# Patient Record
Sex: Female | Born: 1965
Health system: Southern US, Community
[De-identification: ages and names within clinical notes are randomized; demographics above are authoritative.]

## PROBLEM LIST (undated history)

## (undated) DIAGNOSIS — K219 Gastro-esophageal reflux disease without esophagitis: Secondary | ICD-10-CM

## (undated) DIAGNOSIS — J45909 Unspecified asthma, uncomplicated: Secondary | ICD-10-CM

## (undated) DIAGNOSIS — K589 Irritable bowel syndrome without diarrhea: Secondary | ICD-10-CM

## (undated) DIAGNOSIS — E119 Type 2 diabetes mellitus without complications: Secondary | ICD-10-CM

## (undated) DIAGNOSIS — L732 Hidradenitis suppurativa: Secondary | ICD-10-CM

## (undated) DIAGNOSIS — G8929 Other chronic pain: Secondary | ICD-10-CM

## (undated) DIAGNOSIS — I10 Essential (primary) hypertension: Secondary | ICD-10-CM

## (undated) DIAGNOSIS — F32A Depression, unspecified: Secondary | ICD-10-CM

## (undated) DIAGNOSIS — T7840XA Allergy, unspecified, initial encounter: Secondary | ICD-10-CM

## (undated) DIAGNOSIS — M1990 Unspecified osteoarthritis, unspecified site: Secondary | ICD-10-CM

## (undated) DIAGNOSIS — R053 Chronic cough: Secondary | ICD-10-CM

## (undated) DIAGNOSIS — F419 Anxiety disorder, unspecified: Secondary | ICD-10-CM

## (undated) DIAGNOSIS — E7800 Pure hypercholesterolemia, unspecified: Secondary | ICD-10-CM

## (undated) DIAGNOSIS — G43909 Migraine, unspecified, not intractable, without status migrainosus: Secondary | ICD-10-CM

## (undated) DIAGNOSIS — M4306 Spondylolysis, lumbar region: Secondary | ICD-10-CM

## (undated) DIAGNOSIS — L739 Follicular disorder, unspecified: Secondary | ICD-10-CM

## (undated) DIAGNOSIS — G4730 Sleep apnea, unspecified: Secondary | ICD-10-CM

## (undated) DIAGNOSIS — M545 Low back pain, unspecified: Secondary | ICD-10-CM

## (undated) DIAGNOSIS — R911 Solitary pulmonary nodule: Secondary | ICD-10-CM

## (undated) DIAGNOSIS — G894 Chronic pain syndrome: Secondary | ICD-10-CM

## (undated) DIAGNOSIS — R05 Cough: Secondary | ICD-10-CM

## (undated) HISTORY — DX: Other chronic pain: G89.29

## (undated) HISTORY — DX: Irritable bowel syndrome, unspecified: K58.9

## (undated) HISTORY — DX: Sleep apnea, unspecified: G47.30

## (undated) HISTORY — DX: Essential (primary) hypertension: I10

## (undated) HISTORY — DX: Migraine, unspecified, not intractable, without status migrainosus: G43.909

## (undated) HISTORY — DX: Hidradenitis suppurativa: L73.2

## (undated) HISTORY — DX: Low back pain, unspecified: M54.50

## (undated) HISTORY — PX: ENDOMETRIAL ABLATION: SHX621

## (undated) HISTORY — PX: TUBAL LIGATION: SHX77

## (undated) HISTORY — DX: Spondylolysis, lumbar region: M43.06

## (undated) HISTORY — DX: Solitary pulmonary nodule: R91.1

## (undated) HISTORY — DX: Pure hypercholesterolemia, unspecified: E78.00

## (undated) HISTORY — DX: Gastro-esophageal reflux disease without esophagitis: K21.9

## (undated) HISTORY — DX: Allergy, unspecified, initial encounter: T78.40XA

## (undated) HISTORY — DX: Follicular disorder, unspecified: L73.9

## (undated) HISTORY — DX: Type 2 diabetes mellitus without complications: E11.9

## (undated) HISTORY — PX: ABDOMINAL HYSTERECTOMY: SHX81

## (undated) HISTORY — DX: Depression, unspecified: F32.A

## (undated) HISTORY — DX: Chronic pain syndrome: G89.4

---

## 2000-05-21 ENCOUNTER — Ambulatory Visit (HOSPITAL_COMMUNITY): Admission: RE | Admit: 2000-05-21 | Discharge: 2000-05-21 | Payer: Self-pay | Admitting: General Surgery

## 2000-06-25 ENCOUNTER — Emergency Department (HOSPITAL_COMMUNITY): Admission: EM | Admit: 2000-06-25 | Discharge: 2000-06-25 | Payer: Self-pay | Admitting: Emergency Medicine

## 2000-06-25 ENCOUNTER — Encounter: Payer: Self-pay | Admitting: Emergency Medicine

## 2000-07-05 ENCOUNTER — Ambulatory Visit (HOSPITAL_COMMUNITY): Admission: RE | Admit: 2000-07-05 | Discharge: 2000-07-05 | Payer: Self-pay

## 2000-07-05 ENCOUNTER — Encounter: Payer: Self-pay | Admitting: Family Medicine

## 2000-08-06 ENCOUNTER — Encounter (HOSPITAL_COMMUNITY): Admission: RE | Admit: 2000-08-06 | Discharge: 2000-09-05 | Payer: Self-pay | Admitting: Family Medicine

## 2000-09-08 ENCOUNTER — Encounter (HOSPITAL_COMMUNITY): Admission: RE | Admit: 2000-09-08 | Discharge: 2000-10-08 | Payer: Self-pay | Admitting: Family Medicine

## 2001-04-18 ENCOUNTER — Encounter: Payer: Self-pay | Admitting: Family Medicine

## 2001-04-18 ENCOUNTER — Ambulatory Visit (HOSPITAL_COMMUNITY): Admission: RE | Admit: 2001-04-18 | Discharge: 2001-04-18 | Payer: Self-pay | Admitting: Family Medicine

## 2001-10-17 ENCOUNTER — Emergency Department (HOSPITAL_COMMUNITY): Admission: EM | Admit: 2001-10-17 | Discharge: 2001-10-17 | Payer: Self-pay | Admitting: *Deleted

## 2001-10-21 ENCOUNTER — Encounter: Payer: Self-pay | Admitting: Family Medicine

## 2001-10-21 ENCOUNTER — Ambulatory Visit (HOSPITAL_COMMUNITY): Admission: RE | Admit: 2001-10-21 | Discharge: 2001-10-21 | Payer: Self-pay | Admitting: Family Medicine

## 2002-06-08 ENCOUNTER — Ambulatory Visit (HOSPITAL_COMMUNITY): Admission: RE | Admit: 2002-06-08 | Discharge: 2002-06-08 | Payer: Self-pay | Admitting: Internal Medicine

## 2002-06-08 ENCOUNTER — Encounter (INDEPENDENT_AMBULATORY_CARE_PROVIDER_SITE_OTHER): Payer: Self-pay | Admitting: Internal Medicine

## 2002-08-11 ENCOUNTER — Ambulatory Visit (HOSPITAL_COMMUNITY): Admission: RE | Admit: 2002-08-11 | Discharge: 2002-08-11 | Payer: Self-pay | Admitting: Internal Medicine

## 2004-02-22 ENCOUNTER — Emergency Department (HOSPITAL_COMMUNITY): Admission: EM | Admit: 2004-02-22 | Discharge: 2004-02-23 | Payer: Self-pay | Admitting: Emergency Medicine

## 2004-02-28 ENCOUNTER — Emergency Department (HOSPITAL_COMMUNITY): Admission: EM | Admit: 2004-02-28 | Discharge: 2004-02-29 | Payer: Self-pay | Admitting: *Deleted

## 2004-04-22 ENCOUNTER — Ambulatory Visit (HOSPITAL_COMMUNITY): Admission: RE | Admit: 2004-04-22 | Discharge: 2004-04-22 | Payer: Self-pay | Admitting: Family Medicine

## 2005-01-14 ENCOUNTER — Ambulatory Visit: Payer: Self-pay | Admitting: Internal Medicine

## 2005-05-21 ENCOUNTER — Ambulatory Visit (HOSPITAL_COMMUNITY): Admission: RE | Admit: 2005-05-21 | Discharge: 2005-05-21 | Payer: Self-pay | Admitting: Family Medicine

## 2005-05-28 ENCOUNTER — Ambulatory Visit (HOSPITAL_COMMUNITY): Admission: RE | Admit: 2005-05-28 | Discharge: 2005-05-28 | Payer: Self-pay | Admitting: Family Medicine

## 2005-06-22 ENCOUNTER — Ambulatory Visit: Payer: Self-pay | Admitting: Internal Medicine

## 2005-06-23 ENCOUNTER — Ambulatory Visit (HOSPITAL_COMMUNITY): Admission: RE | Admit: 2005-06-23 | Discharge: 2005-06-23 | Payer: Self-pay | Admitting: Internal Medicine

## 2005-06-27 ENCOUNTER — Emergency Department (HOSPITAL_COMMUNITY): Admission: EM | Admit: 2005-06-27 | Discharge: 2005-06-28 | Payer: Self-pay | Admitting: Emergency Medicine

## 2005-06-29 ENCOUNTER — Ambulatory Visit (HOSPITAL_COMMUNITY): Admission: RE | Admit: 2005-06-29 | Discharge: 2005-06-29 | Payer: Self-pay | Admitting: Family Medicine

## 2005-08-03 ENCOUNTER — Ambulatory Visit: Payer: Self-pay | Admitting: Internal Medicine

## 2005-08-11 ENCOUNTER — Encounter: Admission: RE | Admit: 2005-08-11 | Discharge: 2005-11-09 | Payer: Self-pay | Admitting: Specialist

## 2006-02-10 ENCOUNTER — Inpatient Hospital Stay (HOSPITAL_COMMUNITY): Admission: EM | Admit: 2006-02-10 | Discharge: 2006-02-11 | Payer: Self-pay | Admitting: Emergency Medicine

## 2006-05-24 ENCOUNTER — Ambulatory Visit (HOSPITAL_COMMUNITY): Admission: RE | Admit: 2006-05-24 | Discharge: 2006-05-24 | Payer: Self-pay | Admitting: Family Medicine

## 2007-03-21 ENCOUNTER — Emergency Department (HOSPITAL_COMMUNITY): Admission: EM | Admit: 2007-03-21 | Discharge: 2007-03-21 | Payer: Self-pay | Admitting: Emergency Medicine

## 2007-04-12 ENCOUNTER — Ambulatory Visit: Payer: Self-pay

## 2007-05-05 ENCOUNTER — Encounter (HOSPITAL_COMMUNITY): Admission: RE | Admit: 2007-05-05 | Discharge: 2007-06-04 | Payer: Self-pay | Admitting: Orthopedic Surgery

## 2007-05-26 ENCOUNTER — Encounter (HOSPITAL_COMMUNITY): Admission: RE | Admit: 2007-05-26 | Discharge: 2007-06-25 | Payer: Self-pay | Admitting: Family Medicine

## 2007-06-06 ENCOUNTER — Encounter (HOSPITAL_COMMUNITY): Admission: RE | Admit: 2007-06-06 | Discharge: 2007-07-06 | Payer: Self-pay | Admitting: Orthopedic Surgery

## 2007-07-25 ENCOUNTER — Encounter: Payer: Self-pay | Admitting: Obstetrics and Gynecology

## 2007-07-25 ENCOUNTER — Ambulatory Visit (HOSPITAL_COMMUNITY): Admission: RE | Admit: 2007-07-25 | Discharge: 2007-07-25 | Payer: Self-pay | Admitting: Obstetrics and Gynecology

## 2008-02-16 ENCOUNTER — Ambulatory Visit (HOSPITAL_COMMUNITY): Admission: RE | Admit: 2008-02-16 | Discharge: 2008-02-16 | Payer: Self-pay | Admitting: Orthopedic Surgery

## 2008-05-31 ENCOUNTER — Ambulatory Visit (HOSPITAL_COMMUNITY): Admission: RE | Admit: 2008-05-31 | Discharge: 2008-05-31 | Payer: Self-pay | Admitting: Family Medicine

## 2008-06-04 DIAGNOSIS — M25561 Pain in right knee: Secondary | ICD-10-CM

## 2008-06-04 DIAGNOSIS — M25551 Pain in right hip: Secondary | ICD-10-CM

## 2008-06-04 DIAGNOSIS — R109 Unspecified abdominal pain: Secondary | ICD-10-CM | POA: Insufficient documentation

## 2008-06-04 DIAGNOSIS — M25562 Pain in left knee: Secondary | ICD-10-CM

## 2008-06-04 DIAGNOSIS — I10 Essential (primary) hypertension: Secondary | ICD-10-CM | POA: Insufficient documentation

## 2008-06-04 DIAGNOSIS — K5909 Other constipation: Secondary | ICD-10-CM | POA: Insufficient documentation

## 2008-06-04 DIAGNOSIS — G8929 Other chronic pain: Secondary | ICD-10-CM | POA: Insufficient documentation

## 2008-06-04 DIAGNOSIS — G43909 Migraine, unspecified, not intractable, without status migrainosus: Secondary | ICD-10-CM | POA: Insufficient documentation

## 2008-06-04 DIAGNOSIS — M25552 Pain in left hip: Secondary | ICD-10-CM

## 2008-06-05 ENCOUNTER — Ambulatory Visit: Payer: Self-pay | Admitting: Internal Medicine

## 2008-06-05 LAB — CONVERTED CEMR LAB: TSH: 1.439 microintl units/mL (ref 0.350–4.500)

## 2008-06-08 ENCOUNTER — Ambulatory Visit (HOSPITAL_COMMUNITY): Admission: RE | Admit: 2008-06-08 | Discharge: 2008-06-08 | Payer: Self-pay | Admitting: Internal Medicine

## 2008-07-17 ENCOUNTER — Ambulatory Visit: Payer: Self-pay | Admitting: Internal Medicine

## 2008-08-17 ENCOUNTER — Ambulatory Visit (HOSPITAL_COMMUNITY): Admission: RE | Admit: 2008-08-17 | Discharge: 2008-08-17 | Payer: Self-pay | Admitting: Family Medicine

## 2008-10-04 ENCOUNTER — Ambulatory Visit: Payer: Self-pay | Admitting: Internal Medicine

## 2008-10-05 ENCOUNTER — Ambulatory Visit: Payer: Self-pay | Admitting: Internal Medicine

## 2008-10-23 ENCOUNTER — Encounter: Payer: Self-pay | Admitting: Obstetrics and Gynecology

## 2008-10-23 ENCOUNTER — Inpatient Hospital Stay (HOSPITAL_COMMUNITY): Admission: RE | Admit: 2008-10-23 | Discharge: 2008-10-29 | Payer: Self-pay | Admitting: Obstetrics and Gynecology

## 2008-10-23 ENCOUNTER — Ambulatory Visit: Payer: Self-pay | Admitting: Cardiology

## 2008-10-29 ENCOUNTER — Encounter: Payer: Self-pay | Admitting: Obstetrics and Gynecology

## 2008-11-30 ENCOUNTER — Encounter (INDEPENDENT_AMBULATORY_CARE_PROVIDER_SITE_OTHER): Payer: Self-pay | Admitting: *Deleted

## 2008-12-19 ENCOUNTER — Encounter: Payer: Self-pay | Admitting: Urgent Care

## 2008-12-20 ENCOUNTER — Ambulatory Visit (HOSPITAL_COMMUNITY): Admission: RE | Admit: 2008-12-20 | Discharge: 2008-12-20 | Payer: Self-pay | Admitting: Internal Medicine

## 2008-12-20 ENCOUNTER — Encounter: Payer: Self-pay | Admitting: Internal Medicine

## 2009-04-22 ENCOUNTER — Ambulatory Visit: Payer: Self-pay | Admitting: Internal Medicine

## 2009-05-31 ENCOUNTER — Encounter: Admission: RE | Admit: 2009-05-31 | Discharge: 2009-05-31 | Payer: Self-pay | Admitting: Neurology

## 2009-07-10 HISTORY — PX: PILONIDAL CYST EXCISION: SHX744

## 2009-07-18 ENCOUNTER — Ambulatory Visit (HOSPITAL_COMMUNITY): Admission: RE | Admit: 2009-07-18 | Discharge: 2009-07-18 | Payer: Self-pay | Admitting: Family Medicine

## 2009-08-19 ENCOUNTER — Ambulatory Visit (HOSPITAL_COMMUNITY)
Admission: RE | Admit: 2009-08-19 | Discharge: 2009-08-19 | Payer: Self-pay | Source: Home / Self Care | Admitting: General Surgery

## 2009-11-29 ENCOUNTER — Encounter (INDEPENDENT_AMBULATORY_CARE_PROVIDER_SITE_OTHER): Payer: Self-pay | Admitting: *Deleted

## 2009-12-25 ENCOUNTER — Ambulatory Visit (HOSPITAL_COMMUNITY): Admission: RE | Admit: 2009-12-25 | Discharge: 2009-12-25 | Payer: Self-pay | Admitting: Family Medicine

## 2010-02-09 HISTORY — PX: KNEE SURGERY: SHX244

## 2010-03-11 NOTE — Assessment & Plan Note (Signed)
Summary: fu ov 6 mo,constipation/ams   Visit Type:  Follow-up Visit Primary Care Provider:  Lilyan Punt  Chief Complaint:  F/U constipation.  History of Present Illness: Patient is here for six month f/u of chronic constipation. Over the past couple of months, she has had more problems with constipation. She increased her Amitiza to three times a day on her own about three weeks ago. She went from having days without a BM to now having 3-4 soft to loose stools daily. Denies melena, brbpr. She has intermittent abdominal pain in lower abdomen and to left lower quadrant which is crampy. She c/o gas. Denies melena, brbpr. She has had a hysterectomy for fibroid tumors. She takes Mobic for knee pain on a regular basis, but does not take Treximet regularly. She tries not to mix the two NSAIDS. She denies heartburn, vomiting, dysphagia, unintentional weight loss. She also wonders about a recurrent boil on her buttocks. She has taken two rounds of antibiotics but it continues to come back. It drains at times.       Current Medications (verified): 1)  Metoprolol Tartrate 100 Mg Tabs (Metoprolol Tartrate) .... Two Times A Day 2)  Metformin Hcl 500 Mg Tabs (Metformin Hcl) .... Take 1 Tablet By Mouth Two Times A Day 3)  Meloxicam 15 Mg Tabs (Meloxicam) .... Take 1 Tablet By Mouth Once A Day 4)  Triamterene-Hctz 37.5-25 Mg Tabs (Triamterene-Hctz) .... Take 1 Tablet By Mouth Once A Day 5)  Treximet 85-500 Mg Tabs (Sumatriptan-Naproxen Sodium) .... As Needed 6)  Hydrocodone-Acetaminophen 5-500 Mg Tabs (Hydrocodone-Acetaminophen) .... As Needed 7)  Promethazine Hcl 25 Mg Tabs (Promethazine Hcl) .... As Needed With Migraine 8)  Tizanidine Hcl 2 Mg Tabs (Tizanidine Hcl) .... One Tablet in The Morning and Two Tablets At Night 9)  Flexeril 10 Mg Tabs (Cyclobenzaprine Hcl) .... As Needed 10)  Amitiza 24 Mcg Caps (Lubiprostone) .... Taking 3 Tablets Daily  Allergies (verified): No Known Drug  Allergies  Past History:  Past Surgical History: CYST REMOVED FROM RIGHT BREAST AS WELL AS VAGINAL AREA TUBAL LIGATION LATE 1980s Hysterectomy for fibroid tumors, 9/10  Review of Systems      See HPI  Vital Signs:  Patient profile:   45 year old female Height:      66 inches Weight:      226 pounds BMI:     36.61 Temp:     97.9 degrees F oral Pulse rate:   64 / minute BP sitting:   134 / 80  (left arm) Cuff size:   regular  Vitals Entered By: Cloria Spring LPN (April 22, 2009 11:11 AM)  Physical Exam  General:  Well developed, well nourished, no acute distress. Head:  Normocephalic and atraumatic. Eyes:  Sclera  nonicteric. Mouth:  OP moist. Lungs:  Clear throughout to auscultation. Heart:  Regular rate and rhythm; no murmurs, rubs,  or bruits. Abdomen:  normal bowel sounds and obese.  Mild lower abd tenderness to deep palpation. No rebound or guarding. No abd bruit or hernia. Suprapubic incision noted with keloid. No drainage. Rectal:  Exam of external buttocks, reveals 3-4 mm in size raised bump without active drainage. Extremities:  No clubbing, cyanosis, edema or deformities noted. Neurologic:  Alert and  oriented x4;  grossly normal neurologically. Skin:  Intact without significant lesions or rashes. Psych:  Alert and cooperative. Normal mood and affect.  Impression & Recommendations:  Problem # 1:  CONSTIPATION, CHRONIC (ICD-564.09)  Need to adjust regimen. Advised against  taking three Amitiza daily. Will go back to Amitiza by mouth two times a day. Will add Miralax 17 grams by mouth daily as needed. Will add Sustenex one by mouth daily for four weeks. #30 samples. If no improvement, she will call. At that time would consider TCS if no improvement. Last colon imaging was FS with ACBE in 2004.  Orders: Est. Patient Level II (22025)  Problem # 2:  ABDOMINAL PAIN (ICD-789.00)  Likely secondary to above.   Orders: Est. Patient Level II (42706)  Problem  # 3:  ? of PILONIDAL CYST (ICD-685.1)  Recurrent boil at top of gluteal cleft. Small and currently no evidence of abcess. ?pilonidal cyst. I have asked that she call her PCP and discuss with them. She has already had two rounds of antibiotics. May need I+D. She will call PCP and let me know if any further problems or desires referral to surgeon Franky Macho).   Orders: Est. Patient Level II (23762) Prescriptions: POLYETHYLENE GLYCOL 3350  POWD (POLYETHYLENE GLYCOL 3350) 17 gram by mouth daily  #527 grams x 11   Entered and Authorized by:   Leanna Battles. Dixon Boos   Signed by:   Leanna Battles Lewis PA-C on 04/22/2009   Method used:   Print then Give to Patient   RxID:   [id number]

## 2010-03-11 NOTE — Letter (Signed)
Summary: Recall Radiology  [facility]  [address]   [place], [place] [address]   Phone: [phone]  Fax: [phone]    November 29, 2009  [name] [address] [place], [place]  [address] Feb 02, 1966   Dear Ms. [name],   Our office needs to get you scheduled for your CT Scan. Please give our office a call to schedule this.  You may call the office at your convenience at [phone].  Please ask for the Referral Coordinator to make arrangements for this to be scheduled.  You may have to leave a message on our voice mail.  We will return your call.  If for any reason you do not wish to schedule this, please advise the office.  Please do not neglect your health.   Thank you,    Ave Filter  [facility] Ph: [phone]   Fax: [phone]    Appended Document: Recall Radiology Patient called and stated that her primary Dr.(Scott Luking)has already scheduled her CT Scan for November 16th 2011 at [facility]

## 2010-04-27 LAB — CBC
HCT: 38 % (ref 36.0–46.0)
MCV: 90.8 fL (ref 78.0–100.0)
RBC: 4.18 MIL/uL (ref 3.87–5.11)
RDW: 12.9 % (ref 11.5–15.5)
WBC: 8.4 10*3/uL (ref 4.0–10.5)

## 2010-04-27 LAB — POCT I-STAT 4, (NA,K, GLUC, HGB,HCT)
Glucose, Bld: 137 mg/dL — ABNORMAL HIGH (ref 70–99)
HCT: 40 % (ref 36.0–46.0)
Hemoglobin: 13.6 g/dL (ref 12.0–15.0)
Potassium: 3.4 mEq/L — ABNORMAL LOW (ref 3.5–5.1)
Sodium: 135 mEq/L (ref 135–145)

## 2010-04-27 LAB — BASIC METABOLIC PANEL
BUN: 14 mg/dL (ref 6–23)
Chloride: 102 mEq/L (ref 96–112)
Potassium: 3.3 mEq/L — ABNORMAL LOW (ref 3.5–5.1)

## 2010-05-16 LAB — BASIC METABOLIC PANEL
BUN: 2 mg/dL — ABNORMAL LOW (ref 6–23)
BUN: 3 mg/dL — ABNORMAL LOW (ref 6–23)
BUN: 5 mg/dL — ABNORMAL LOW (ref 6–23)
CO2: 31 mEq/L (ref 19–32)
Calcium: 8.6 mg/dL (ref 8.4–10.5)
Calcium: 8.9 mg/dL (ref 8.4–10.5)
Chloride: 102 mEq/L (ref 96–112)
Creatinine, Ser: 0.63 mg/dL (ref 0.4–1.2)
GFR calc Af Amer: >60 mL/min (ref >60)
GFR calc non Af Amer: >60 mL/min (ref >60)
GFR calc non Af Amer: >60 mL/min (ref >60)
GFR calc non Af Amer: >60 mL/min (ref >60)
GFR calc non Af Amer: >60 mL/min (ref >60)
Glucose, Bld: 104 mg/dL — ABNORMAL HIGH (ref 70–99)
Glucose, Bld: 117 mg/dL — ABNORMAL HIGH (ref 70–99)
Glucose, Bld: 159 mg/dL — ABNORMAL HIGH (ref 70–99)
Potassium: 3.3 mEq/L — ABNORMAL LOW (ref 3.5–5.1)
Potassium: 3.6 mEq/L (ref 3.5–5.1)
Sodium: 135 mEq/L (ref 135–145)
Sodium: 137 mEq/L (ref 135–145)
Sodium: 139 mEq/L (ref 135–145)

## 2010-05-16 LAB — CBC
HCT: 18.4 % — ABNORMAL LOW (ref 36.0–46.0)
HCT: 19.4 % — ABNORMAL LOW (ref 36.0–46.0)
HCT: 30.7 % — ABNORMAL LOW (ref 36.0–46.0)
HCT: 38.2 % (ref 36.0–46.0)
Hemoglobin: 11 g/dL — ABNORMAL LOW (ref 12.0–15.0)
Hemoglobin: 6.4 g/dL — CL (ref 12.0–15.0)
Hemoglobin: 13.1 g/dL (ref 12.0–15.0)
MCHC: 34.8 g/dL (ref 30.0–36.0)
MCHC: 34.5 g/dL (ref 30.0–36.0)
MCV: 92.1 fL (ref 78.0–100.0)
MCV: 90 fL (ref 78.0–100.0)
Platelets: 239 10*3/uL (ref 150–400)
Platelets: 260 10*3/uL (ref 150–400)
Platelets: 296 10*3/uL (ref 150–400)
RBC: 2 MIL/uL — ABNORMAL LOW (ref 3.87–5.11)
RBC: 3.39 MIL/uL — ABNORMAL LOW (ref 3.87–5.11)
RBC: 4.24 MIL/uL (ref 3.87–5.11)
RDW: 13.1 % (ref 11.5–15.5)
RDW: 13.6 % (ref 11.5–15.5)
RDW: 14.2 % (ref 11.5–15.5)
WBC: 6.7 10*3/uL (ref 4.0–10.5)
WBC: 7.2 10*3/uL (ref 4.0–10.5)

## 2010-05-16 LAB — GLUCOSE, CAPILLARY
Glucose-Capillary: 100 mg/dL — ABNORMAL HIGH (ref 70–99)
Glucose-Capillary: 112 mg/dL — ABNORMAL HIGH (ref 70–99)
Glucose-Capillary: 112 mg/dL — ABNORMAL HIGH (ref 70–99)
Glucose-Capillary: 114 mg/dL — ABNORMAL HIGH (ref 70–99)
Glucose-Capillary: 122 mg/dL — ABNORMAL HIGH (ref 70–99)
Glucose-Capillary: 123 mg/dL — ABNORMAL HIGH (ref 70–99)
Glucose-Capillary: 130 mg/dL — ABNORMAL HIGH (ref 70–99)
Glucose-Capillary: 131 mg/dL — ABNORMAL HIGH (ref 70–99)
Glucose-Capillary: 132 mg/dL — ABNORMAL HIGH (ref 70–99)
Glucose-Capillary: 121 mg/dL — ABNORMAL HIGH (ref 70–99)
Glucose-Capillary: 133 mg/dL — ABNORMAL HIGH (ref 70–99)
Glucose-Capillary: 149 mg/dL — ABNORMAL HIGH (ref 70–99)
Glucose-Capillary: 172 mg/dL — ABNORMAL HIGH (ref 70–99)
Glucose-Capillary: 208 mg/dL — ABNORMAL HIGH (ref 70–99)

## 2010-05-16 LAB — DIFFERENTIAL
Basophils Absolute: 0 10*3/uL (ref 0.0–0.1)
Basophils Absolute: 0 10*3/uL (ref 0.0–0.1)
Basophils Absolute: 0 10*3/uL (ref 0.0–0.1)
Basophils Relative: 0 % (ref 0–1)
Eosinophils Absolute: 0.3 10*3/uL (ref 0.0–0.7)
Eosinophils Relative: 2 % (ref 0–5)
Eosinophils Relative: 4 % (ref 0–5)
Eosinophils Relative: 4 % (ref 0–5)
Lymphocytes Relative: 14 % (ref 12–46)
Lymphocytes Relative: 19 % (ref 12–46)
Lymphocytes Relative: 21 % (ref 12–46)
Lymphocytes Relative: 24 % (ref 12–46)
Lymphs Abs: 1.6 10*3/uL (ref 0.7–4.0)
Monocytes Absolute: 0.6 10*3/uL (ref 0.1–1.0)
Monocytes Absolute: 1.1 10*3/uL — ABNORMAL HIGH (ref 0.1–1.0)
Monocytes Relative: 10 % (ref 3–12)
Monocytes Relative: 9 % (ref 3–12)
Neutro Abs: 5.1 10*3/uL (ref 1.7–7.7)
Neutro Abs: 5.1 10*3/uL (ref 1.7–7.7)
Neutrophils Relative %: 67 % (ref 43–77)

## 2010-05-16 LAB — CROSSMATCH: Antibody Screen: NEGATIVE

## 2010-05-16 LAB — COMPREHENSIVE METABOLIC PANEL
BUN: 14 mg/dL (ref 6–23)
CO2: 27 mEq/L (ref 19–32)
Calcium: 9.4 mg/dL (ref 8.4–10.5)
Chloride: 104 mEq/L (ref 96–112)
Creatinine, Ser: 0.65 mg/dL (ref 0.4–1.2)
GFR calc Af Amer: >60 mL/min (ref >60)
GFR calc non Af Amer: >60 mL/min (ref >60)
Glucose, Bld: 111 mg/dL — ABNORMAL HIGH (ref 70–99)
Total Bilirubin: 0.7 mg/dL (ref 0.3–1.2)

## 2010-05-16 LAB — TYPE AND SCREEN
ABO/RH(D): O NEG
Antibody Screen: NEGATIVE

## 2010-06-24 NOTE — H&P (Signed)
NAME:  [name], [name]           ACCOUNT NO.:  [id number]   MEDICAL RECORD NO.:  [id number]          PATIENT TYPE:  AMB   LOCATION:  DAY                           FACILITY:  [facility]   PHYSICIAN:  Tilda Burrow, M.D. DATE OF BIRTH:  1965/10/10   DATE OF ADMISSION:  DATE OF DISCHARGE:  LH                              HISTORY & PHYSICAL   ADMITTING DIAGNOSES:  1. Menorrhagia secondary to uterine fibroids.  2. Anemia.  3. Migraine, admitted for hysteroscopy, dilation and curettage, and      endometrial ablation.   The patient is a 45 year old female gravida 1, para 1, status post tubal  ligation, last menstrual period July 15, 2007 to July 18, 2007, was on  admitted on July 25, 2007, for hysteroscopy, D&C, and endometrial  ablation after referral from Dr. Fletcher Anon office for she was seen  complaining of heavy menses and associated mild anemia.  She has had a  transabdominal and transvaginal ultrasound which shows multiple small  fibroids up to 3-cm in maximum diameter, none of which are located  submucosally.  The plan is for hysteroscopy, D&C, and endometrial  ablation.  Previous endometrial thickening of 5-mm during proliferative  phase has been documented and reviewed with Dr Alver Fisher who indicates there  are no suspicious areas.  Endometrial biopsy is therefore deferred.   PAST MEDICAL HISTORY:  Benign other than mild hypertension and right  knee pain.  Additionally, the patient has intermittent reflux symptoms  which she treats with omeprazole p.r.n.  She has significant migraine  headaches which require treatment at least weekly.   SURGICAL HISTORY:  Right knee arthroscopy and bilateral tubal ligation.  Injuries none.   ALLERGIES:  None.   MEDICATIONS:  1. Omeprazole 20 mg p.o. daily p.r.n. heartburn.  2. Cymbalta 60 mg p.o. daily for mild depression.  3. Meloxicam 15 mg daily for knee pain.  4. Triamterene and hydrochlorothiazide p.o. daily for blood pressure.  5.  Tizanidine 2 mg 1 q.a.m., 2 q.p.m.  6. Metoprolol 100 mg 2 tablets daily.  7. Topamax 50 mg 2 tablets daily.  8. Treximet 85/500 taken p.r.n. for migraines.  9. Additional p.r.n. medicines for headaches include promethazine,      Flexeril, and Vicodin 750.  She also periodically takes      methocarbamol 500 mg p.r.n. knee pain, and ibuprofen p.r.n. knee      pain.   ALLERGIES:  No known drug allergies.   PHYSICAL EXAMINATION:  GENERAL:  Height 5 feet 6 inches, weight 234.  VITAL SIGNS:  Blood pressure 138/78, pulse 70.  HEENT:  Pupils equal, round, and reactive to light.  NECK:  Supple.  Normal thyroid.  CHEST:  Clear to auscultation.  ABDOMEN:  Obese without distinct masses or tenderness.  EXTERNAL GENITALIA:  Multiparous.  Vaginal exam:  Normal secretions.  Mid cycle mucus on cervix.  Uterus anteflexed.  Upper limits of normal  size.  Adnexa without masses or tenderness.  Rectal support appears  adequate.   IMPRESSION:  1. Menorrhagia with mild anemia.  2. Migraine headache.  3. Hypertension.  4. Knee arthritis.  5. Mild  anemia.   PLAN:  Hysteroscopy, dilation and curettage, and endometrial ablation on  July 25, 2007.   ADDENDUM:  The patient has had procedure reviewed using instructional  booklets by Gaylyn Rong as well as Gynecare Thermachoice III medical  explainers.  Questions have been encouraged from patient and on July 21, 2007, has no further questions proceeding the surgery.      Tilda Burrow, M.D.  Electronically Signed     JVF/MEDQ  D:  07/21/2007  T:  07/22/2007  Job:  [id number]   cc:   Dr. Gerda Diss   [facility]

## 2010-06-24 NOTE — Op Note (Signed)
NAMEDAKIYAH, [name]           ACCOUNT NO.:  [id number]   MEDICAL RECORD NO.:  [id number]          PATIENT TYPE:  AMB   LOCATION:  DAY                           FACILITY:  [facility]   PHYSICIAN:  Tilda Burrow, M.D. DATE OF BIRTH:  [birth year]   DATE OF PROCEDURE:  07/25/2007  DATE OF DISCHARGE:                               OPERATIVE REPORT   PREOPERATIVE DIAGNOSES:  1. Menorrhagia secondary to uterine fibroids.  2. Anemia.  3. Migraines.   POSTOPERATIVE DIAGNOSES:  1. Menorrhagia secondary to uterine fibroids.  2. Anemia.  3. Migraines.  4. Endometrial polyps.   PROCEDURES:  1. Hysteroscopy.  2. Dilation and curettage.  3. Endometrial ablation.   SURGEON:  Tilda Burrow, MD   ASSISTANT:  None.   ANESTHESIA:  General with endotracheal intubation.   COMPLICATIONS:  None.   FINDINGS:  1. A 1.5-cm pedunculated endometrial polyp.  2. Uterine fundal fibroid, type 2, with greater than 50% of the      fibroid buried in the uterine fundus.  3. Shaggy endometrium, though the patient is in the follicular phase,      curetted.   DETAILS OF PROCEDURE:  The patient was taken to the operating room,  prepped, and draped for vaginal procedure after consent was obtained and  urine hCG confirmed as negative.  Time-out was conducted according to  [facility] standards and procedure initiated.  Cervix was grasped, and the  uterus sounded to 10 cm, dilated with dilators to 25 [place], allowing  introduction of a 30-degree rigid hysteroscope visualizing the uterine  cavity with a fundal endometrial polyp 1.5 cm in length with a small  polyp stalk noted as well as a fundal fibroid just to the medial side of  the right tubal ostium.  Both tubal ostia could be visualized.  The  uterine fundal fibroid was type 2 buried primarily in the uterine wall  but felt to be still a good candidate for ablation procedure.  Once  curettage was performed and hysteroscopy confirmed that we had removed  the  polyp down to the its base using hysteroscopic resection scissors,  we were able to perform the Gynecare ThermaChoice III endometrial  ablation sequence using 15 mL of D5W for an 8-minute sequence, with all  15 mL of fluid recovered at the end of the procedure.  Paracervical  block using 20 mL of 0.5% Marcaine with Epinephrine was injected  paracervically in paracervical tissues and the patient allowed to awaken  and go to the recovery room in good condition, where Toradol will be  given intravenously x1 and oral NSAIDs and Percocet prescriptions given  to the patient for discharge.  Routine outpatient medications will be  continued unchanged.      Tilda Burrow, M.D.  Electronically Signed     JVF/MEDQ  D:  07/25/2007  T:  07/26/2007  Job:  [id number]   cc:   [facility]   Donna Bernard, M.D.  Fax: [phone]

## 2010-06-27 NOTE — H&P (Signed)
[name], [name]           ACCOUNT NO.:  [id number]   MEDICAL RECORD NO.:  [id number]          PATIENT TYPE:  INP   LOCATION:  IC07                          FACILITY:  [facility]   PHYSICIAN:  Scott A. Gerda Diss, MD    DATE OF BIRTH:  1965-12-17   DATE OF ADMISSION:  02/10/2006  DATE OF DISCHARGE:  LH                              HISTORY & PHYSICAL   CHIEF COMPLAINT:  Lethargy.   HISTORY OF PRESENT ILLNESS:  This is a 45 year old black female who has  a known history of migraines. Has not seen [place] since May of 2007.  Apparently has been having a lot of migraines, and this morning woke up  and her head was hurting, and so she went ahead and took a dose of her  Neurontin as well as taking baclofen. When it came to finding how much  baclofen she took, she thinks she took somewhere between 2 or more. She  denies using other medications other than the Neurontin and baclofen.  She denies using any street drugs and states that she was just having a  bad headache. She is arousable, but she is also lethargic and unable to  follow complex commands. She does not have any focal signs. The ER  doctor went head and evaluated her and called [place] to admit her. The  patient is not suicidal.   PAST MEDICAL HISTORY:  1. Migraines.  2. History of hypertension.  3. Cluster headaches.   FAMILY HISTORY:  Colon cancer, hypertension, diabetes, heart disease,  and lipids.   SOCIAL HISTORY:  Does not work currently. Does not smoke.   MEDICATIONS:  1. Baclofen on a p.r.n. basis.  2. Neurontin 600 mg b.i.d.   The patient is uncertain of any other medicines she has. She states she  gets her medicines through [facility] in [place] and [facility].   REVIEW OF SYSTEMS:  See per above. No fevers. No cough. No respiratory  distress, vomiting or diarrhea recently.   LABORATORY DATA:  The laboratory work done by the ER doctor came back  all essentially normal except for a slightly low potassium and a  slightly low albumin.   PHYSICAL EXAMINATION:  Pupils are dilated, somewhat responsive to light.  The patient is unable to follow EOMI instruction.  NECK:  Normal. No masses. No respiratory obstruction noted.  LUNGS:  Clear.  ABDOMEN:  Soft.  EXTREMITIES:  No edema. Patient able to move arms and legs by command  but not able to do complex functions.   CT scan of the head reported negative by ER doctor. Laboratory work  overall looks normal except for slightly low potassium.   ASSESSMENT AND PLAN:  Drug ingestion, accidental:  Will admit to ICU for  supportive care. I expect the patient to significantly improve over the  next 24 to 48 hours. Will consult pharmacy to see if any charcoal is  indicated. My impression is that it is not, given that the ingestion  probably occurred somewhere around 4 or 5 a.m., and it is now currently  1:30 p.m. In addition to this, I do not feel the  patient ought to be on  these medications when she gets discharged. I think she ought to talk  with her specialist about other combinations. We will go ahead and talk  with her pharmacy at [facility] as well as [facility] to get a profile on  what she has been getting filled recently since it has been greater than  6 months since we had last seen her.      Scott A. Gerda Diss, MD  Electronically Signed     SAL/MEDQ  D:  02/10/2006  T:  02/10/2006  Job:  [id number]

## 2010-06-27 NOTE — Op Note (Signed)
   NAME:  [name], [name]                     ACCOUNT NO.:  [id number]   MEDICAL RECORD NO.:  [id number]                   PATIENT TYPE:  AMB   LOCATION:  DAY                                  FACILITY:  [facility]   PHYSICIAN:  Lionel December, M.D.                 DATE OF BIRTH:  October 13, 1965   DATE OF PROCEDURE:  08/11/2002  DATE OF DISCHARGE:                                 OPERATIVE REPORT   PROCEDURE:  Flexible sigmoidoscopy.   ENDOSCOPIST:  Lionel December, M.D.   INDICATIONS:  [name] is a 45 year old African American female with a  problem with chronic constipation.  She is getting better with therapy.  She  had a barium enema recently which was normal.  She was seen earlier in the  week with hematochezia.  Therefore, a sigmoidoscopy is recommended.  The  procedure was reviewed with the patient and informed consent was obtained.   PREMEDICATIONS:  None.   FINDINGS:  The procedure was performed in the endoscopy suite.  The patient  was placed in the left lateral recumbent position.  Rectal examination was  performed.  Same pattern as noted on digital examination but there was no  abnormality.  The Olympus videoscope was placed in the rectum and advanced  under vision into the sigmoid colon and beyond.  Preparation was excellent.  The scope was passed to 60 cm which was felt to be the splenic flexure.  As  the scope was withdrawn, the colonic mucosa was carefully examined and it  was normal. Rectal mucosa was normal.   The scope was retroflexed to examine the anorectal junction and hemorrhoids  were noted below the dentate line along with some petechiae at the anorectal  junction.  The endoscope was straightened and withdrawn.   The patient tolerated the procedure well.   FINAL DIAGNOSIS:  Normal examination to 60 cm other than external  hemorrhoids felt to be the cause of her recent rectal bleeding.    RECOMMENDATIONS:  1. She will continue barbiturates, high-fiber diet,  Lactulose, and Zelnorm     as before.  2. Anusol HC as directed at bedtime x2 weeks.  3. Keep stool diary and  return for OV two months from now.                                               Lionel December, M.D.    NR/MEDQ  D:  08/11/2002  T:  08/11/2002  Job:  [id number]   cc:   Lorin Picket A. Gerda Diss, M.D.  [address]  [place]  [place] [address]  Fax: [phone]

## 2010-06-27 NOTE — H&P (Signed)
NAME:  [name], [name]                     ACCOUNT NO.:  [id number]   MEDICAL RECORD NO.:  [id number]                   PATIENT TYPE:  OUT   LOCATION:  RAD                                  FACILITY:  [facility]   PHYSICIAN:  Lionel December, M.D.                 DATE OF BIRTH:  10-Jul-1965   DATE OF ADMISSION:  06/08/2002  DATE OF DISCHARGE:  06/08/2002                                HISTORY & PHYSICAL   PRESENTING COMPLAINT:  Constipation, hematochezia.   HISTORY OF PRESENT ILLNESS:  [name] is a 45 year old African-American  female who was here for a scheduled visit for follow up for constipation.  She was last seen on April 20, 2002.  Her constipation began about seven or  eight months ago.  She had a normal TSH and a serum calcium.  She was begun  on FiberChoice.  She was given GoLYTELY x1, and begun on lactulose.  She  returned for a follow up visit on June 05, 2002, and she was not feeling a  lot better.  She had been taking OTC laxatives.  She was begun on Zelnorm.  She says she is feeling a little bit better.  She is having an average of  one bowel movement every other day, and what she passes are hard stool or  balls.  She has had a few episodes of hematochezia, but this morning she  passed what she describes to be a large amount of fresh blood.  She did not  have any melena, nausea, or vomiting.  She did have pain across her upper  abdomen, which was alleviated once she had a bowel movement.  She remains  with a good appetite.   MEDICATIONS:  1. She is using hydrocodone/AP for migraines, but she does not take it every     day.  2. Phenergan 25 mg  3. __________  p.r.n.  4. Aleve p.r.n.  5. Zyrtec D p.r.n.  6. Allegra p.r.n.  7. Lactulose two teaspoonsful daily.  8. FiberChoice two tablets daily.  9. Zelnorm 6 mg b.i.d.   PAST MEDICAL HISTORY:  Negative, other than migraines.   ALLERGIES:  None known.   FAMILY HISTORY:  Negative for colorectal carcinoma.  Father is  diabetic.   SOCIAL HISTORY:  She is single.  She works at the med center.  She has never  smoked cigarettes, and does not drink alcohol.   PHYSICAL EXAMINATION:  GENERAL:  A pleasant, mildly obese African-American  female who is in no acute distress.  She weighs 216 pounds.  She is 5'5  tall.  VITAL SIGNS:  Pulse 82 per minute, blood pressure 140/82.  She is afebrile.  HEENT:  Conjunctivae are pink.  Sclerae are anicteric.  NECK:  Without masses or thyromegaly.  CARDIAC:  Regular rate.  Normal S1 and S2.  No murmur or gallops noted.  LUNGS:  Clear to auscultation.  ABDOMEN:  Obese.  Bowel  sounds are normal.  Palpation reveals a soft abdomen  without tenderness,  organomegaly or masses.  RECTAL:  Deferred.  EXTREMITIES:  No peripheral edema or clubbing noted.   She also had a barium enema on June 08, 2002, which was a normal contrast  study.   ASSESSMENT:  [name] has chronic constipation felt to be due to  dysmotility.  Her TSH and serum calcium are normal.  She has not responded  well to therapy; however, she is only on a small dose of lactulose.  She had  hematochezia today with passage of a fairly large amount of fresh blood.  I  suspect this is related to hemorrhoidal bleeding, and she had a barium enema  recently.  Will do a flexible sigmoidoscopy to make sure she does not have  any poly or other lesions in the rectal sigmoid area.   PLAN:  1. Flexible sigmoidoscopy to be performed at [facility] in the near     future.  2. She will increase the lactulose to 30 ml daily, and continue Zelnorm at 6     mg b.i.d.  3. I have asked her to keep a diary as to stool frequency.  If she does not     have a bowel movement by day #3, she can use a suppository and/or Fleet's     enema.                                               Lionel December, M.D.    NR/MEDQ  D:  08/07/2002  T:  08/08/2002  Job:  [id number]   cc:   Lorin Picket A. Gerda Diss, M.D.  [address]   [place]  [place] [address]  Fax: [phone]

## 2010-06-27 NOTE — Discharge Summary (Signed)
NAMEABRYANNA, [name]           ACCOUNT NO.:  [id number]   MEDICAL RECORD NO.:  [id number]          PATIENT TYPE:  INP   LOCATION:  IC07                          FACILITY:  [facility]   PHYSICIAN:  Scott A. Gerda Diss, MD    DATE OF BIRTH:  [birth year]   DATE OF ADMISSION:  02/10/2006  DATE OF DISCHARGE:  LH                               DISCHARGE SUMMARY   DISCHARGE DIAGNOSES:  1. Hypersomnolence with mental status changes secondary to medication      accidental over ingestion.  2. Severe migraines.  3. HTN.   HOSPITAL COURSE:  This patient was admitted with having significant  drowsiness, lethargy, mental confusion, and the inability to really  follow commands well, except for awaking briefly to talk.  It was felt  that the patient needed to be admitted into the ICU in order to be  monitored closely.  She was admitted in and watched closely and over the  course of the next 24 hours, she became more perky, awake, alert,  responsive, and interactive.  She was able to feed herself, get up, sit,  eat, and to breathe normally with a normal blood pressure.  She did have  some headache during the night, which goes on with her migraine  syndrome.  She was treated with a Vicodin without any significant side  effect.  She was felt stable to go home on 01/11/06.   DISCHARGE MEDICATIONS:  Discharged on:  1. Neurontin 600 mg 1 twice a day.  2. Baclofen to use rarely 1 every 8 hours as needed for severe muscle      spasms in the neck.  3. Diazide 37.5/25 one daily.  4. Metoprolol 100 mg 1 b.i.d.   DISCHARGE INSTRUCTIONS:  She was instructed not to drive today, start on  Celexa 20 mg 1/2 daily for 7 days and then 1 daily.  She is to follow up  in the office in 1 week.  We will work on setting her up with a headache  specialist in [place] or here in [place].      Scott A. Gerda Diss, MD  Electronically Signed     SAL/MEDQ  D:  02/11/2006  T:  02/11/2006  Job:  [id number]

## 2010-07-14 ENCOUNTER — Other Ambulatory Visit: Payer: Self-pay | Admitting: Family Medicine

## 2010-07-14 DIAGNOSIS — Z139 Encounter for screening, unspecified: Secondary | ICD-10-CM

## 2010-07-20 ENCOUNTER — Emergency Department (HOSPITAL_COMMUNITY)
Admission: EM | Admit: 2010-07-20 | Discharge: 2010-07-20 | Disposition: A | Discharge status: Home / Self Care | Payer: Medicare Other | Attending: Emergency Medicine | Admitting: Emergency Medicine

## 2010-07-20 DIAGNOSIS — M545 Low back pain, unspecified: Secondary | ICD-10-CM | POA: Insufficient documentation

## 2010-07-20 DIAGNOSIS — E119 Type 2 diabetes mellitus without complications: Secondary | ICD-10-CM | POA: Insufficient documentation

## 2010-07-20 DIAGNOSIS — M542 Cervicalgia: Secondary | ICD-10-CM | POA: Insufficient documentation

## 2010-07-20 DIAGNOSIS — I10 Essential (primary) hypertension: Secondary | ICD-10-CM | POA: Insufficient documentation

## 2010-07-22 ENCOUNTER — Ambulatory Visit (HOSPITAL_COMMUNITY): Payer: Medicare Other

## 2010-07-25 ENCOUNTER — Emergency Department (HOSPITAL_COMMUNITY): Payer: Medicare Other

## 2010-07-25 ENCOUNTER — Emergency Department (HOSPITAL_COMMUNITY)
Admission: EM | Admit: 2010-07-25 | Discharge: 2010-07-25 | Disposition: A | Discharge status: Home / Self Care | Payer: Medicare Other | Attending: Emergency Medicine | Admitting: Emergency Medicine

## 2010-07-25 ENCOUNTER — Ambulatory Visit (HOSPITAL_COMMUNITY)
Admission: RE | Admit: 2010-07-25 | Discharge: 2010-07-25 | Disposition: A | Discharge status: Home / Self Care | Payer: Medicare Other | Source: Ambulatory Visit | Attending: Family Medicine | Admitting: Family Medicine

## 2010-07-25 DIAGNOSIS — R42 Dizziness and giddiness: Secondary | ICD-10-CM | POA: Insufficient documentation

## 2010-07-25 DIAGNOSIS — Z1231 Encounter for screening mammogram for malignant neoplasm of breast: Secondary | ICD-10-CM | POA: Insufficient documentation

## 2010-07-25 DIAGNOSIS — Z79899 Other long term (current) drug therapy: Secondary | ICD-10-CM | POA: Insufficient documentation

## 2010-07-25 DIAGNOSIS — R071 Chest pain on breathing: Secondary | ICD-10-CM | POA: Insufficient documentation

## 2010-07-25 DIAGNOSIS — R0602 Shortness of breath: Secondary | ICD-10-CM | POA: Insufficient documentation

## 2010-07-25 DIAGNOSIS — Z139 Encounter for screening, unspecified: Secondary | ICD-10-CM

## 2010-07-25 DIAGNOSIS — F3289 Other specified depressive episodes: Secondary | ICD-10-CM | POA: Insufficient documentation

## 2010-07-25 DIAGNOSIS — E119 Type 2 diabetes mellitus without complications: Secondary | ICD-10-CM | POA: Insufficient documentation

## 2010-07-25 DIAGNOSIS — G43909 Migraine, unspecified, not intractable, without status migrainosus: Secondary | ICD-10-CM | POA: Insufficient documentation

## 2010-07-25 DIAGNOSIS — R110 Nausea: Secondary | ICD-10-CM | POA: Insufficient documentation

## 2010-07-25 DIAGNOSIS — F329 Major depressive disorder, single episode, unspecified: Secondary | ICD-10-CM | POA: Insufficient documentation

## 2010-07-25 DIAGNOSIS — I10 Essential (primary) hypertension: Secondary | ICD-10-CM | POA: Insufficient documentation

## 2010-07-25 LAB — CK TOTAL AND CKMB (NOT AT ARMC): Relative Index: INVALID (ref 0.0–2.5)

## 2010-07-25 LAB — TROPONIN I: Troponin I: <0.3 ng/mL (ref <0.30)

## 2010-07-25 LAB — BASIC METABOLIC PANEL
CO2: 28 mEq/L (ref 19–32)
Chloride: 94 mEq/L — ABNORMAL LOW (ref 96–112)
Creatinine, Ser: 0.79 mg/dL (ref 0.50–1.10)
GFR calc Af Amer: >60 mL/min (ref >60)
Potassium: 3.6 mEq/L (ref 3.5–5.1)

## 2010-07-25 LAB — DIFFERENTIAL
Eosinophils Relative: 3 % (ref 0–5)
Lymphocytes Relative: 35 % (ref 12–46)
Lymphs Abs: 2.9 10*3/uL (ref 0.7–4.0)
Monocytes Absolute: 0.7 10*3/uL (ref 0.1–1.0)

## 2010-07-25 LAB — CBC
HCT: 38.7 % (ref 36.0–46.0)
MCH: 30.8 pg (ref 26.0–34.0)
MCV: 88.4 fL (ref 78.0–100.0)
Platelets: 335 10*3/uL (ref 150–400)
RBC: 4.38 MIL/uL (ref 3.87–5.11)
RDW: 12.2 % (ref 11.5–15.5)
WBC: 8.3 10*3/uL (ref 4.0–10.5)

## 2010-08-04 ENCOUNTER — Encounter (HOSPITAL_COMMUNITY)
Admission: RE | Admit: 2010-08-04 | Discharge: 2010-08-04 | Disposition: A | Discharge status: Home / Self Care | Payer: Medicare Other | Source: Ambulatory Visit | Attending: Orthopedic Surgery | Admitting: Orthopedic Surgery

## 2010-08-05 ENCOUNTER — Ambulatory Visit (HOSPITAL_COMMUNITY)
Admission: RE | Admit: 2010-08-05 | Discharge: 2010-08-05 | Disposition: A | Discharge status: Home / Self Care | Payer: Medicare Other | Source: Ambulatory Visit | Attending: Family Medicine | Admitting: Family Medicine

## 2010-08-05 ENCOUNTER — Other Ambulatory Visit: Payer: Self-pay | Admitting: Family Medicine

## 2010-08-05 DIAGNOSIS — M25519 Pain in unspecified shoulder: Secondary | ICD-10-CM | POA: Insufficient documentation

## 2010-08-05 DIAGNOSIS — M542 Cervicalgia: Secondary | ICD-10-CM | POA: Insufficient documentation

## 2010-08-05 DIAGNOSIS — M25512 Pain in left shoulder: Secondary | ICD-10-CM

## 2010-08-05 DIAGNOSIS — M5030 Other cervical disc degeneration, unspecified cervical region: Secondary | ICD-10-CM | POA: Insufficient documentation

## 2010-08-06 ENCOUNTER — Ambulatory Visit (HOSPITAL_COMMUNITY)
Admission: RE | Admit: 2010-08-06 | Discharge: 2010-08-06 | Disposition: A | Discharge status: Home / Self Care | Payer: Medicare Other | Source: Ambulatory Visit | Attending: Family Medicine | Admitting: Family Medicine

## 2010-08-06 ENCOUNTER — Ambulatory Visit (HOSPITAL_COMMUNITY): Payer: Medicare Other | Admitting: *Deleted

## 2010-08-06 DIAGNOSIS — R262 Difficulty in walking, not elsewhere classified: Secondary | ICD-10-CM | POA: Insufficient documentation

## 2010-08-06 DIAGNOSIS — M6281 Muscle weakness (generalized): Secondary | ICD-10-CM | POA: Insufficient documentation

## 2010-08-06 DIAGNOSIS — IMO0001 Reserved for inherently not codable concepts without codable children: Secondary | ICD-10-CM | POA: Insufficient documentation

## 2010-08-06 DIAGNOSIS — M25569 Pain in unspecified knee: Secondary | ICD-10-CM | POA: Insufficient documentation

## 2010-08-07 ENCOUNTER — Ambulatory Visit (HOSPITAL_COMMUNITY): Payer: Medicare Other | Admitting: *Deleted

## 2010-08-12 ENCOUNTER — Ambulatory Visit (HOSPITAL_COMMUNITY)
Admission: RE | Admit: 2010-08-12 | Discharge: 2010-08-12 | Disposition: A | Discharge status: Home / Self Care | Payer: Medicare Other | Source: Ambulatory Visit | Attending: Family Medicine | Admitting: Family Medicine

## 2010-08-12 DIAGNOSIS — M6281 Muscle weakness (generalized): Secondary | ICD-10-CM | POA: Insufficient documentation

## 2010-08-12 DIAGNOSIS — IMO0001 Reserved for inherently not codable concepts without codable children: Secondary | ICD-10-CM | POA: Insufficient documentation

## 2010-08-12 DIAGNOSIS — M25569 Pain in unspecified knee: Secondary | ICD-10-CM | POA: Insufficient documentation

## 2010-08-12 DIAGNOSIS — R262 Difficulty in walking, not elsewhere classified: Secondary | ICD-10-CM | POA: Insufficient documentation

## 2010-08-15 ENCOUNTER — Ambulatory Visit (HOSPITAL_COMMUNITY)
Admission: RE | Admit: 2010-08-15 | Discharge: 2010-08-15 | Disposition: A | Discharge status: Home / Self Care | Payer: Medicare Other | Source: Ambulatory Visit | Attending: Family Medicine | Admitting: Family Medicine

## 2010-08-18 ENCOUNTER — Ambulatory Visit (HOSPITAL_COMMUNITY): Payer: Medicare Other | Admitting: *Deleted

## 2010-08-20 ENCOUNTER — Ambulatory Visit (HOSPITAL_COMMUNITY)
Admission: RE | Admit: 2010-08-20 | Discharge: 2010-08-20 | Disposition: A | Discharge status: Home / Self Care | Payer: Medicare Other | Source: Ambulatory Visit | Attending: Family Medicine | Admitting: Family Medicine

## 2010-08-20 NOTE — Progress Notes (Addendum)
Physical Therapy Treatment Patient Name: RYANA MONTECALVO ZOXWR'U Date: 08/20/2010  Visit #: 6/7  Time In: 9:17  Time Out: 10:05  Subjective: Pt c/o R lateral knee pain with therex.   Objective:  Pt displays facial grimace with therex.         Exercise/Treatments @FLOW [phone]  For further details on exercises see doc flowsheets.  Iontophoresis: 2 cc dexamethazone to right lateral knee.  Goals PT Short Term Goals Short Term Goal 1: Independent with HEP. Long Term Goal 1 Progress: Met Short Term Goal 2: Pain level decreased to no greater than a 4. Long Term Goal 2 Progress: Progressing toward goal Short Term Goal 3: Able to sleep throughout the night. Long Term Goal 3 Progress: Progressing toward goal PT Long Term Goals Long Term Goal 1: Able to stand for 30 minutes Long Term Goal 1 Progress: Progressing toward goal Long Term Goal 2: Able to come from sit to stand without difficulty Long Term Goal 2 Progress: Progressing toward goal Long Term Goal 3: Able to walk for 45 min. Long Term Goal 3 Progress: Progressing toward goal Long Term Goal 4: LE strength WFL to allow the above to occur. Long Term Goal 4 Progress: Progressing toward goal End of Session Patient Active Problem List  Diagnoses  . DEPRESSION  . MIGRAINE HEADACHE  . HYPERTENSION  . CONSTIPATION, CHRONIC  . ARTHRITIS  . ABDOMINAL PAIN   PT - End of Session Activity Tolerance: Patient limited by pain  Assessment: Further therex held after squats, heel raises, and SLR secondary to increased pain. Iontophoresis (2nd tx) administered secondary to increased pain.   Plan: Continue per PT POC.   Seth Bake Leah 08/20/2010, 10:12 AM   Addendum: Pt started by PT.  Seth Bake, PTA/ Rollene Rotunda. [place], PT, DPT

## 2010-08-22 ENCOUNTER — Ambulatory Visit (HOSPITAL_COMMUNITY): Payer: Medicare Other | Admitting: Physical Therapy

## 2010-08-22 ENCOUNTER — Telehealth (HOSPITAL_COMMUNITY): Payer: Self-pay | Admitting: Physical Therapy

## 2010-08-28 ENCOUNTER — Ambulatory Visit (HOSPITAL_COMMUNITY)
Admission: RE | Admit: 2010-08-28 | Discharge: 2010-08-28 | Disposition: A | Discharge status: Home / Self Care | Payer: Medicare Other | Source: Ambulatory Visit | Attending: Family Medicine | Admitting: Family Medicine

## 2010-08-28 NOTE — Progress Notes (Signed)
Physical Therapy Treatment Patient Name: [name] Date: 08/28/2010  S/P R arthroscopy. Pt 6/14 rx  Seen for therex 2; ionto  needs reass 7/23                                                       Symptoms/Limitations Symptoms: increases with walking, Pain Assessment Currently in Pain?: Yes Pain Score:   5 Pain Location: Knee Pain Orientation: Right Pain Type: Chronic pain;Surgical pain Pain Onset: More than a month ago Pain Frequency: Constant Effect of Pain on Daily Activities: increases throughout  Precautions/Restrictions     Mobility (including Balance)       Exercise/Treatments Terminal knee extension standing with blue t-band Lumbar Stretches Passive Hamstring Stretch: 60 seconds (long sitting) Stability Exercises Heel Raises: 15 reps Lumbar Machine Exercises Stationary Bike: 6@2 .5 Hip Stretches Passive Hamstring Stretch: 60 seconds (long sitting) Hip Exercises Heel Slides: 15 reps Hamstring Curl: 10 reps (3 #) Hip Extension: 10 reps Additional Hip Exercises SLS: 3x max 8 sec. Lateral Step Up: 10 reps;Step Height: 2" Rocker Board: 1 minute Stationary Bike: 6@2 .5 Knee Stretches Passive Hamstring Stretch: 60 seconds (long sitting) Knee Exercises Quad Sets: 10 reps;Other (comment) (ham/quad contraction) Heel Raises: 15 reps Heel Slides: 15 reps Hip Extension: 10 reps Hamstring Curl: 10 reps (3 #) Hip ABduction: Strengthening;Sidelying (3#) Hip ADduction: 10 reps;Sidelying Additional Knee Exercises Lateral Step Up: 10 reps;Step Height: 2" Functional Squat: 15 reps Rocker Board: 1 minute SLS: 3x max 8 sec. Ankle Exercises Heel Raises: 15 reps Additional Ankle Exercises SLS: 3x max 8 sec. Rocker Board: 1 minute Balance Exercises Stationary Bike: 6@2 .5 Heel Raises: 15 reps Modalities Modalities: Iontophoresis Iontophoresis Type of Iontophoresis: Dexamethasone Location: R knee Dose: 60ma Time: 15 Weight Bearing Technique Weight  Bearing Technique: No  Goals PT Short Term Goals Short Term Goal 1 Progress: Met Short Term Goal 2 Progress: Progressing toward goal Short Term Goal 3: wakes up sometimes 2x was always 2 Short Term Goal 3 Progress: Progressing toward goal PT Long Term Goals Long Term Goal 1: was 10-15 Long Term Goal 1 Progress: Progressing toward goal Long Term Goal 2 Progress: Progressing toward goal Long Term Goal 3 Progress: Progressing toward goal End of Session Patient Active Problem List  Diagnoses  . DEPRESSION  . MIGRAINE HEADACHE  . HYPERTENSION  . CONSTIPATION, CHRONIC  . ARTHRITIS  . ABDOMINAL PAIN   PT - End of Session Activity Tolerance: Patient tolerated treatment well General Behavior During Session: [facility] for tasks performed Cognition: [facility] for tasks performed PT Assessment and Plan Clinical Impression Statement: Patient states ionto decreased pain somewhat.  Added ex per doc flow sheet. Rehab Potential: Good PT Frequency: Min 2X/week PT Duration: 4 weeks PT Treatment/Interventions: Therapeutic exercise;Other (comment) (ionto) PT Plan: continue to see pt.  Add forward lunge side lunge next visit.  Maeson Purohit,CINDY 08/28/2010, 10:35 AM

## 2010-11-06 LAB — BASIC METABOLIC PANEL
CO2: 29
Chloride: 104
GFR calc Af Amer: >60
Potassium: 3.7

## 2010-11-06 LAB — PREGNANCY, URINE: Preg Test, Ur: NEGATIVE

## 2010-11-06 LAB — CBC
HCT: 33 — ABNORMAL LOW
Hemoglobin: 11.1 — ABNORMAL LOW
MCHC: 33.7
MCV: 84.3
RBC: 3.92
WBC: 7

## 2010-12-26 ENCOUNTER — Ambulatory Visit (HOSPITAL_COMMUNITY)
Admission: RE | Admit: 2010-12-26 | Discharge: 2010-12-26 | Disposition: A | Discharge status: Home / Self Care | Payer: Medicare Other | Source: Ambulatory Visit | Attending: Family Medicine | Admitting: Family Medicine

## 2010-12-26 ENCOUNTER — Other Ambulatory Visit: Payer: Self-pay | Admitting: Family Medicine

## 2010-12-26 DIAGNOSIS — Z09 Encounter for follow-up examination after completed treatment for conditions other than malignant neoplasm: Secondary | ICD-10-CM | POA: Insufficient documentation

## 2010-12-26 DIAGNOSIS — R918 Other nonspecific abnormal finding of lung field: Secondary | ICD-10-CM

## 2010-12-26 DIAGNOSIS — J984 Other disorders of lung: Secondary | ICD-10-CM | POA: Insufficient documentation

## 2010-12-30 ENCOUNTER — Ambulatory Visit (HOSPITAL_COMMUNITY)
Admission: RE | Admit: 2010-12-30 | Discharge: 2010-12-30 | Disposition: A | Discharge status: Home / Self Care | Payer: Medicare Other | Source: Ambulatory Visit | Attending: Family Medicine | Admitting: Family Medicine

## 2010-12-30 DIAGNOSIS — J984 Other disorders of lung: Secondary | ICD-10-CM | POA: Insufficient documentation

## 2010-12-30 DIAGNOSIS — Z09 Encounter for follow-up examination after completed treatment for conditions other than malignant neoplasm: Secondary | ICD-10-CM

## 2011-04-02 ENCOUNTER — Ambulatory Visit (HOSPITAL_COMMUNITY)
Admission: RE | Admit: 2011-04-02 | Discharge: 2011-04-02 | Disposition: A | Discharge status: Home / Self Care | Payer: Medicare Other | Source: Ambulatory Visit | Attending: Family Medicine | Admitting: Family Medicine

## 2011-04-02 ENCOUNTER — Other Ambulatory Visit: Payer: Self-pay | Admitting: Family Medicine

## 2011-04-02 DIAGNOSIS — M545 Low back pain, unspecified: Secondary | ICD-10-CM | POA: Diagnosis not present

## 2011-04-02 DIAGNOSIS — G609 Hereditary and idiopathic neuropathy, unspecified: Secondary | ICD-10-CM | POA: Diagnosis not present

## 2011-04-02 DIAGNOSIS — M5137 Other intervertebral disc degeneration, lumbosacral region: Secondary | ICD-10-CM | POA: Insufficient documentation

## 2011-04-02 DIAGNOSIS — E119 Type 2 diabetes mellitus without complications: Secondary | ICD-10-CM | POA: Diagnosis not present

## 2011-04-02 DIAGNOSIS — M51379 Other intervertebral disc degeneration, lumbosacral region without mention of lumbar back pain or lower extremity pain: Secondary | ICD-10-CM | POA: Insufficient documentation

## 2011-04-02 DIAGNOSIS — M47817 Spondylosis without myelopathy or radiculopathy, lumbosacral region: Secondary | ICD-10-CM | POA: Diagnosis not present

## 2011-04-17 DIAGNOSIS — IMO0002 Reserved for concepts with insufficient information to code with codable children: Secondary | ICD-10-CM | POA: Diagnosis not present

## 2011-04-17 DIAGNOSIS — M2240 Chondromalacia patellae, unspecified knee: Secondary | ICD-10-CM | POA: Diagnosis not present

## 2011-06-15 DIAGNOSIS — G609 Hereditary and idiopathic neuropathy, unspecified: Secondary | ICD-10-CM | POA: Diagnosis not present

## 2011-06-15 DIAGNOSIS — E119 Type 2 diabetes mellitus without complications: Secondary | ICD-10-CM | POA: Diagnosis not present

## 2011-06-29 ENCOUNTER — Other Ambulatory Visit: Payer: Self-pay | Admitting: Family Medicine

## 2011-06-29 DIAGNOSIS — Z139 Encounter for screening, unspecified: Secondary | ICD-10-CM

## 2011-06-30 DIAGNOSIS — E119 Type 2 diabetes mellitus without complications: Secondary | ICD-10-CM | POA: Diagnosis not present

## 2011-06-30 DIAGNOSIS — H2510 Age-related nuclear cataract, unspecified eye: Secondary | ICD-10-CM | POA: Diagnosis not present

## 2011-07-13 DIAGNOSIS — R21 Rash and other nonspecific skin eruption: Secondary | ICD-10-CM | POA: Diagnosis not present

## 2011-07-14 DIAGNOSIS — M1710 Unilateral primary osteoarthritis, unspecified knee: Secondary | ICD-10-CM | POA: Diagnosis not present

## 2011-07-14 DIAGNOSIS — M25569 Pain in unspecified knee: Secondary | ICD-10-CM | POA: Diagnosis not present

## 2011-07-21 DIAGNOSIS — M1710 Unilateral primary osteoarthritis, unspecified knee: Secondary | ICD-10-CM | POA: Diagnosis not present

## 2011-07-27 ENCOUNTER — Ambulatory Visit (HOSPITAL_COMMUNITY)
Admission: RE | Admit: 2011-07-27 | Discharge: 2011-07-27 | Disposition: A | Discharge status: Home / Self Care | Payer: Medicare Other | Source: Ambulatory Visit | Attending: Family Medicine | Admitting: Family Medicine

## 2011-07-27 ENCOUNTER — Other Ambulatory Visit: Payer: Self-pay | Admitting: Family Medicine

## 2011-07-27 DIAGNOSIS — N63 Unspecified lump in unspecified breast: Secondary | ICD-10-CM

## 2011-07-27 DIAGNOSIS — Z139 Encounter for screening, unspecified: Secondary | ICD-10-CM

## 2011-07-28 DIAGNOSIS — M1710 Unilateral primary osteoarthritis, unspecified knee: Secondary | ICD-10-CM | POA: Diagnosis not present

## 2011-07-29 ENCOUNTER — Ambulatory Visit (HOSPITAL_COMMUNITY)
Admission: RE | Admit: 2011-07-29 | Discharge: 2011-07-29 | Disposition: A | Discharge status: Home / Self Care | Payer: Medicare Other | Source: Ambulatory Visit | Attending: Family Medicine | Admitting: Family Medicine

## 2011-07-29 DIAGNOSIS — N6459 Other signs and symptoms in breast: Secondary | ICD-10-CM | POA: Diagnosis not present

## 2011-07-29 DIAGNOSIS — N63 Unspecified lump in unspecified breast: Secondary | ICD-10-CM | POA: Diagnosis not present

## 2011-08-20 DIAGNOSIS — L732 Hidradenitis suppurativa: Secondary | ICD-10-CM | POA: Diagnosis not present

## 2011-08-20 DIAGNOSIS — A499 Bacterial infection, unspecified: Secondary | ICD-10-CM | POA: Diagnosis not present

## 2011-11-30 DIAGNOSIS — Z0000 Encounter for general adult medical examination without abnormal findings: Secondary | ICD-10-CM | POA: Diagnosis not present

## 2011-11-30 DIAGNOSIS — Z23 Encounter for immunization: Secondary | ICD-10-CM | POA: Diagnosis not present

## 2011-12-01 DIAGNOSIS — M899 Disorder of bone, unspecified: Secondary | ICD-10-CM | POA: Diagnosis not present

## 2011-12-01 DIAGNOSIS — E119 Type 2 diabetes mellitus without complications: Secondary | ICD-10-CM | POA: Diagnosis not present

## 2011-12-01 DIAGNOSIS — I10 Essential (primary) hypertension: Secondary | ICD-10-CM | POA: Diagnosis not present

## 2011-12-01 DIAGNOSIS — Z79899 Other long term (current) drug therapy: Secondary | ICD-10-CM | POA: Diagnosis not present

## 2011-12-18 DIAGNOSIS — IMO0002 Reserved for concepts with insufficient information to code with codable children: Secondary | ICD-10-CM | POA: Diagnosis not present

## 2011-12-18 DIAGNOSIS — L708 Other acne: Secondary | ICD-10-CM | POA: Diagnosis not present

## 2012-02-12 DIAGNOSIS — L03319 Cellulitis of trunk, unspecified: Secondary | ICD-10-CM | POA: Diagnosis not present

## 2012-02-12 DIAGNOSIS — R5381 Other malaise: Secondary | ICD-10-CM | POA: Diagnosis not present

## 2012-02-12 DIAGNOSIS — G43809 Other migraine, not intractable, without status migrainosus: Secondary | ICD-10-CM | POA: Diagnosis not present

## 2012-02-12 DIAGNOSIS — R040 Epistaxis: Secondary | ICD-10-CM | POA: Diagnosis not present

## 2012-02-12 DIAGNOSIS — Z79899 Other long term (current) drug therapy: Secondary | ICD-10-CM | POA: Diagnosis not present

## 2012-02-12 DIAGNOSIS — L02219 Cutaneous abscess of trunk, unspecified: Secondary | ICD-10-CM | POA: Diagnosis not present

## 2012-02-12 DIAGNOSIS — R252 Cramp and spasm: Secondary | ICD-10-CM | POA: Diagnosis not present

## 2012-02-16 DIAGNOSIS — L0390 Cellulitis, unspecified: Secondary | ICD-10-CM | POA: Diagnosis not present

## 2012-02-16 DIAGNOSIS — L0291 Cutaneous abscess, unspecified: Secondary | ICD-10-CM | POA: Diagnosis not present

## 2012-02-17 DIAGNOSIS — L738 Other specified follicular disorders: Secondary | ICD-10-CM | POA: Diagnosis not present

## 2012-03-04 DIAGNOSIS — L738 Other specified follicular disorders: Secondary | ICD-10-CM | POA: Diagnosis not present

## 2012-05-26 ENCOUNTER — Telehealth: Payer: Self-pay | Admitting: *Deleted

## 2012-05-26 MED ORDER — CEPHALEXIN 500 MG PO CAPS: 500.0000 mg | ORAL_CAPSULE | Freq: Four times a day (QID) | ORAL | Status: DC

## 2012-05-26 NOTE — Telephone Encounter (Signed)
Pt states has reoccurring external "cyst", started draining yesterday, Keflex 500 mg qid x 7 days per verbal order Dr. Emelda Fear

## 2012-06-03 ENCOUNTER — Encounter: Payer: Self-pay | Admitting: *Deleted

## 2012-06-06 ENCOUNTER — Ambulatory Visit (INDEPENDENT_AMBULATORY_CARE_PROVIDER_SITE_OTHER): Payer: Medicare Other | Admitting: Obstetrics and Gynecology

## 2012-06-06 ENCOUNTER — Encounter: Payer: Self-pay | Admitting: Obstetrics and Gynecology

## 2012-06-06 VITALS — BP 128/80 | Ht 65.0 in | Wt 225.0 lb

## 2012-06-06 DIAGNOSIS — L738 Other specified follicular disorders: Secondary | ICD-10-CM | POA: Diagnosis not present

## 2012-06-06 NOTE — Patient Instructions (Signed)
NOTIFY us IF INFLAMMATION RECURS.

## 2012-06-06 NOTE — Progress Notes (Signed)
  Assessment:  healed folliculits DM-type 2, on victoza   Plan:  Patient to call early for recurrence so we can see her while swelling is acute.  Subjective:  [name] is a 47 y.o. female, G1P1, who presents for recheck of old hyst incision, where she noted drainage below incison line in midline last week.,. SHE'S diabetic , so attention to good infeciton control is a prioroity  The following portions of the patient's history were reviewed and updated as appropriate: allergies, current medications, past medical & surgical history, & past family history.   There is no significant family history of breast or ovarian cancer.    Review of Systems Pertinent items are noted in HPI. Breast:Negative for breast lump,nipple discharge or nipple retraction Gastrointestinal: Negative for abdominal pain, change in bowel habits or rectal bleeding GU: Negative for dysuria, frequency, urgency or incontinence.   GYN: No LMP recorded. Patient has had a hysterectomy.   Objective:  BP 128/80  Ht 5\' 5"  (1.651 m)  Wt 225 lb (102.059 kg)  BMI 37.44 kg/m2    BMI: Body mass index is 37.44 kg/(m^2).  General Appearance: Alert, appropriate appearance for age. No acute distress HEENT: Grossly normal Neck / Thyroid: Supple, no masses, nodes or enlargement Cardiovascular: Regular rate and rhythm. S1, S2, no murmur Lungs: Clear to auscultation bilaterally Back: No CVA tenderness Gastrointestinal: Soft, non-tender, no masses or organomegalyWELL HEALED ABD INCISION, WITH NO RESIDUAL ERYTHEMA OR NODULARITY.  Pelvic Exam: Exam deferred. Rectovaginal: not indicated  Christin Bach MD

## 2012-06-08 ENCOUNTER — Ambulatory Visit (INDEPENDENT_AMBULATORY_CARE_PROVIDER_SITE_OTHER): Payer: Medicare Other | Admitting: Nurse Practitioner

## 2012-06-08 ENCOUNTER — Encounter: Payer: Self-pay | Admitting: Nurse Practitioner

## 2012-06-08 VITALS — BP 130/82 | Temp 98.4°F | Wt 224.4 lb

## 2012-06-08 DIAGNOSIS — H1045 Other chronic allergic conjunctivitis: Secondary | ICD-10-CM | POA: Diagnosis not present

## 2012-06-08 DIAGNOSIS — H1013 Acute atopic conjunctivitis, bilateral: Secondary | ICD-10-CM

## 2012-06-08 DIAGNOSIS — J309 Allergic rhinitis, unspecified: Secondary | ICD-10-CM

## 2012-06-08 DIAGNOSIS — J0110 Acute frontal sinusitis, unspecified: Secondary | ICD-10-CM

## 2012-06-08 MED ORDER — CETIRIZINE HCL 10 MG PO TABS: ORAL_TABLET | ORAL | Status: DC

## 2012-06-08 MED ORDER — FLUTICASONE PROPIONATE 50 MCG/ACT NA SUSP: NASAL | Status: DC

## 2012-06-08 MED ORDER — OLOPATADINE HCL 0.2 % OP SOLN: OPHTHALMIC | Status: DC

## 2012-06-08 MED ORDER — METHYLPREDNISOLONE ACETATE 40 MG/ML IJ SUSP
40.0000 mg | Freq: Once | INTRAMUSCULAR | Status: AC
  Administered 2012-06-08: 40 mg via INTRAMUSCULAR

## 2012-06-08 MED ORDER — AMOXICILLIN-POT CLAVULANATE 875-125 MG PO TABS: 1.0000 | ORAL_TABLET | Freq: Two times a day (BID) | ORAL | Status: DC

## 2012-06-09 ENCOUNTER — Encounter: Payer: Self-pay | Admitting: Nurse Practitioner

## 2012-06-09 NOTE — Progress Notes (Signed)
Subjective:  Presents with complaints of a flareup of her allergies. Began about 5 days ago. Having itchy watery eyes particularly on the left side. Slight tenderness at times. No visual changes. No excessive drainage, mainly clear tearing. No fever. Yellow green nasal drainage. Frequent cough. Throat irritation. Off-and-on ear pain. Possible wheeze at times. Left frontal area headache.  Objective:   BP 130/82  Temp(Src) 98.4 F (36.9 C)  Wt 224 lb 6.4 oz (101.787 kg)  BMI 37.34 kg/m2 NAD. Alert, oriented. TMs significant clear effusion, no erythema. Conjunctiva injected, more so on the left. No preauricular adenopathy noted. Nasal mucosa pale and very boggy more so on the left. Pharynx mildly erythematous with PND noted. Neck supple with mild soft nontender adenopathy. Lungs clear. Heart regular rate rhythm.  Assessment:Acute frontal sinusitis - Plan: methylPREDNISolone acetate (DEPO-MEDROL) injection 40 mg  Allergic rhinitis  Allergic conjunctivitis, bilateral  Plan: Meds ordered this encounter  Medications  . methylPREDNISolone acetate (DEPO-MEDROL) injection 40 mg    Sig:   . DISCONTD: Olopatadine HCl (PATADAY) 0.2 % SOLN    Sig: One drop OU Qd prn allergies    Dispense:  2.5 mL    Refill:  11    Order Specific Question:  Supervising Provider    Answer:  Merlyn Albert [[id number]]  . DISCONTD: cetirizine (ZYRTEC) 10 MG tablet    Sig: One po qhs Prn allergies    Dispense:  30 tablet    Refill:  11    Order Specific Question:  Supervising Provider    Answer:  Merlyn Albert [[id number]]  . DISCONTD: fluticasone (FLONASE) 50 MCG/ACT nasal spray    Sig: 2 sprays each nostril qd prn allergies    Dispense:  16 g    Refill:  11    Order Specific Question:  Supervising Provider    Answer:  Merlyn Albert [[id number]]  . DISCONTD: amoxicillin-clavulanate (AUGMENTIN) 875-125 MG per tablet    Sig: Take 1 tablet by mouth 2 (two) times daily.    Dispense:  20 tablet    Refill:  0    Order  Specific Question:  Supervising Provider    Answer:  Merlyn Albert [[id number]]  . fluticasone (FLONASE) 50 MCG/ACT nasal spray    Sig: 2 sprays each nostril qd prn allergies    Dispense:  16 g    Refill:  11    Order Specific Question:  Supervising Provider    Answer:  Merlyn Albert [[id number]]  . amoxicillin-clavulanate (AUGMENTIN) 875-125 MG per tablet    Sig: Take 1 tablet by mouth 2 (two) times daily.    Dispense:  20 tablet    Refill:  0    Order Specific Question:  Supervising Provider    Answer:  Merlyn Albert [[id number]]  . cetirizine (ZYRTEC) 10 MG tablet    Sig: One po qhs Prn allergies    Dispense:  30 tablet    Refill:  11    Order Specific Question:  Supervising Provider    Answer:  Merlyn Albert [[id number]]  . Olopatadine HCl (PATADAY) 0.2 % SOLN    Sig: One drop OU Qd prn allergies    Dispense:  2.5 mL    Refill:  11    Order Specific Question:  Supervising Provider    Answer:  Merlyn Albert [[id number]]   Plan: Avoid excessive exposure to pollen. Warning signs reviewed. Call back in 4-5 days if no improvement symptoms, sooner if worse.

## 2012-06-10 ENCOUNTER — Encounter: Payer: Self-pay | Admitting: *Deleted

## 2012-06-13 ENCOUNTER — Telehealth: Payer: Self-pay | Admitting: *Deleted

## 2012-06-13 NOTE — Telephone Encounter (Signed)
Augmentin can cause vaginal yeast infection and/or diarrhea.  If intense diarrhea, stop antibiotic.  If mild, needs colon probiotic such as Activia yogurt or Align (pill).  If she wants, I can call in Diflucan.

## 2012-06-13 NOTE — Telephone Encounter (Signed)
May refill pataday opthalmic x3,Diflucan 150 mg 1 by mouth x1

## 2012-06-13 NOTE — Telephone Encounter (Signed)
Patent wants diflucan called into [facility] [place]. Also needs more eye drops called into [facility] [name]

## 2012-06-13 NOTE — Telephone Encounter (Signed)
Feels abt are giving her side effects. Vaginal irritation, rash on next.  The eyedrops prescribed to her is irritating (redness with swelling and drainage).  No temp taken. Some diarhea.  Noticed side effects over the weekend. Still on ABT.

## 2012-06-14 ENCOUNTER — Other Ambulatory Visit: Payer: Self-pay

## 2012-06-14 MED ORDER — OLOPATADINE HCL 0.2 % OP SOLN: OPHTHALMIC | Status: DC

## 2012-06-14 MED ORDER — FLUCONAZOLE 150 MG PO TABS: 150.0000 mg | ORAL_TABLET | Freq: Once | ORAL | Status: DC

## 2012-06-14 NOTE — Telephone Encounter (Signed)
RX for pataday and diflucan sent in to [facility]. Patient notified.

## 2012-06-21 DIAGNOSIS — H15009 Unspecified scleritis, unspecified eye: Secondary | ICD-10-CM | POA: Diagnosis not present

## 2012-06-28 ENCOUNTER — Telehealth: Payer: Self-pay | Admitting: Nurse Practitioner

## 2012-06-28 DIAGNOSIS — H15009 Unspecified scleritis, unspecified eye: Secondary | ICD-10-CM | POA: Diagnosis not present

## 2012-06-28 MED ORDER — HYDROCODONE-ACETAMINOPHEN 5-325 MG PO TABS: 1.0000 | ORAL_TABLET | ORAL | Status: DC | PRN

## 2012-06-28 MED ORDER — PROMETHAZINE HCL 25 MG PO TABS
25.0000 mg | ORAL_TABLET | Freq: Four times a day (QID) | ORAL | Status: DC | PRN
Start: 2012-06-28 — End: 2013-11-08

## 2012-06-28 NOTE — Telephone Encounter (Signed)
Refill, [facility] [place], Vicodin 5/500 q4h prn, promethazine 25 mg 1q6 PRN

## 2012-07-01 ENCOUNTER — Other Ambulatory Visit: Payer: Self-pay | Admitting: Nurse Practitioner

## 2012-07-06 DIAGNOSIS — H15009 Unspecified scleritis, unspecified eye: Secondary | ICD-10-CM | POA: Diagnosis not present

## 2012-07-13 DIAGNOSIS — H15009 Unspecified scleritis, unspecified eye: Secondary | ICD-10-CM | POA: Diagnosis not present

## 2012-07-18 ENCOUNTER — Other Ambulatory Visit: Payer: Self-pay | Admitting: Nurse Practitioner

## 2012-07-20 ENCOUNTER — Encounter: Payer: Self-pay | Admitting: Adult Health

## 2012-07-20 ENCOUNTER — Ambulatory Visit (INDEPENDENT_AMBULATORY_CARE_PROVIDER_SITE_OTHER): Payer: Medicare Other | Admitting: Adult Health

## 2012-07-20 VITALS — BP 152/90 | Ht 65.0 in | Wt 226.0 lb

## 2012-07-20 DIAGNOSIS — L738 Other specified follicular disorders: Secondary | ICD-10-CM

## 2012-07-20 DIAGNOSIS — H15009 Unspecified scleritis, unspecified eye: Secondary | ICD-10-CM | POA: Diagnosis not present

## 2012-07-20 DIAGNOSIS — E119 Type 2 diabetes mellitus without complications: Secondary | ICD-10-CM

## 2012-07-20 DIAGNOSIS — L739 Follicular disorder, unspecified: Secondary | ICD-10-CM | POA: Insufficient documentation

## 2012-07-20 HISTORY — DX: Follicular disorder, unspecified: L73.9

## 2012-07-20 MED ORDER — SULFAMETHOXAZOLE-TRIMETHOPRIM 800-160 MG PO TABS: 1.0000 | ORAL_TABLET | Freq: Two times a day (BID) | ORAL | Status: DC

## 2012-07-20 NOTE — Patient Instructions (Addendum)
Folliculitis  Folliculitis is redness, soreness, and swelling (inflammation) of the hair follicles. This condition can occur anywhere on the body. People with weakened immune systems, diabetes, or obesity have a greater risk of getting folliculitis. CAUSES  Bacterial infection. This is the most common cause.  Fungal infection.  Viral infection.  Contact with certain chemicals, especially oils and tars. Long-term folliculitis can result from bacteria that live in the nostrils. The bacteria may trigger multiple outbreaks of folliculitis over time. SYMPTOMS Folliculitis most commonly occurs on the scalp, thighs, legs, back, buttocks, and areas where hair is shaved frequently. An early sign of folliculitis is a small, white or yellow, pus-filled, itchy lesion (pustule). These lesions appear on a red, inflamed follicle. They are usually less than 0.2 inches (5 mm) wide. When there is an infection of the follicle that goes deeper, it becomes a boil or furuncle. A group of closely packed boils creates a larger lesion (carbuncle). Carbuncles tend to occur in hairy, sweaty areas of the body. DIAGNOSIS  Your caregiver can usually tell what is wrong by doing a physical exam. A sample may be taken from one of the lesions and tested in a lab. This can help determine what is causing your folliculitis. TREATMENT  Treatment may include:  Applying warm compresses to the affected areas.  Taking antibiotic medicines orally or applying them to the skin.  Draining the lesions if they contain a large amount of pus or fluid.  Laser hair removal for cases of long-lasting folliculitis. This helps to prevent regrowth of the hair. HOME CARE INSTRUCTIONS  Apply warm compresses to the affected areas as directed by your caregiver.  If antibiotics are prescribed, take them as directed. Finish them even if you start to feel better.  You may take over-the-counter medicines to relieve itching.  Do not shave  irritated skin.  Follow up with your caregiver as directed. SEEK IMMEDIATE MEDICAL CARE IF:   You have increasing redness, swelling, or pain in the affected area.  You have a fever. MAKE SURE YOU:  Understand these instructions.  Will watch your condition.  Will get help right away if you are not doing well or get worse. Document Released: 04/06/2001 Document Revised: 07/28/2011 Document Reviewed: 04/28/2011 [facility] Patient Information 2014 [place], [place]. Take septra ds as directed Return in 2 weeks for recheck Eat yogurt

## 2012-07-20 NOTE — Progress Notes (Signed)
Subjective:     Patient ID: [name], female   DOB: 1965/10/02, 47 y.o.   MRN: [id number]  HPI [name] is a 47 year old black female in complaining of spot on her hysterectomy scar She saw Dr. Emelda Fear recently about this.  Review of Systems Complaints as in HPI Reviewed past medical,surgical, social and family history. Reviewed medications and allergies.     Objective:   Physical Exam BP 152/90  Ht 5\' 5"  (1.651 m)  Wt 226 lb (102.513 kg)  BMI 37.61 kg/m2  Pt has a slight redness on scar less than 1 cm, no pustule or blister like area, no temperature change.   Explained this area does not need I&D at this time, no fluid there. Assessment:      Folliculitis in diabetic    Plan:      Rx Septra DS 1 bid x 14 days #28 with 1 refill Use dial soap Review handout on folliculitis Call any changes or problems Eat yogurt while on septra

## 2012-07-27 DIAGNOSIS — H15009 Unspecified scleritis, unspecified eye: Secondary | ICD-10-CM | POA: Diagnosis not present

## 2012-07-27 DIAGNOSIS — Z794 Long term (current) use of insulin: Secondary | ICD-10-CM | POA: Diagnosis not present

## 2012-07-27 DIAGNOSIS — E119 Type 2 diabetes mellitus without complications: Secondary | ICD-10-CM | POA: Diagnosis not present

## 2012-08-03 ENCOUNTER — Encounter: Payer: Self-pay | Admitting: Adult Health

## 2012-08-03 ENCOUNTER — Ambulatory Visit (INDEPENDENT_AMBULATORY_CARE_PROVIDER_SITE_OTHER): Payer: Medicare Other | Admitting: Adult Health

## 2012-08-03 VITALS — BP 120/84 | Ht 65.0 in | Wt 220.0 lb

## 2012-08-03 DIAGNOSIS — L732 Hidradenitis suppurativa: Secondary | ICD-10-CM

## 2012-08-03 NOTE — Patient Instructions (Addendum)
Hidradenitis Suppurativa, Sweat Gland Abscess Hidradenitis suppurativa is a long lasting (chronic), uncommon disease of the sweat glands. With this, boil-like lumps and scarring develop in the groin, some times under the arms (axillae), and under the breasts. It may also uncommonly occur behind the ears, in the crease of the buttocks, and around the genitals.  CAUSES  The cause is from a blocking of the sweat glands. They then become infected. It may cause drainage and odor. It is not contagious. So it cannot be given to someone else. It most often shows up in puberty (about 35 to 49 years of age). But it may happen much later. It is similar to acne which is a disease of the sweat glands. This condition is slightly more common in African-Americans and women. SYMPTOMS   Hidradenitis usually starts as one or more red, tender, swellings in the groin or under the arms (axilla).  Over a period of hours to days the lesions get larger. They often open to the skin surface, draining clear to yellow-colored fluid.  The infected area heals with scarring. DIAGNOSIS  Your caregiver makes this diagnosis by looking at you. Sometimes cultures (growing germs on plates in the lab) may be taken. This is to see what germ (bacterium) is causing the infection.  TREATMENT   Topical germ killing medicine applied to the skin (antibiotics) are the treatment of choice. Antibiotics taken by mouth (systemic) are sometimes needed when the condition is getting worse or is severe.  Avoid tight-fitting clothing which traps moisture in.  Dirt does not cause hidradenitis and it is not caused by poor hygiene.  Involved areas should be cleaned daily using an antibacterial soap. Some patients find that the liquid form of Lever 2000, applied to the involved areas as a lotion after bathing, can help reduce the odor related to this condition.  Sometimes surgery is needed to drain infected areas or remove scarred tissue. Removal of  large amounts of tissue is used only in severe cases.  Birth control pills may be helpful.  Oral retinoids (vitamin A derivatives) for 6 to 12 months which are effective for acne may also help this condition.  Weight loss will improve but not cure hidradenitis. It is made worse by being overweight. But the condition is not caused by being overweight.  This condition is more common in people who have had acne.  It may become worse under stress. There is no medical cure for hidradenitis. It can be controlled, but not cured. The condition usually continues for years with periods of getting worse and getting better (remission). Document Released: 09/10/2003 Document Revised: 04/20/2011 Document Reviewed: 09/26/2007 [facility] Patient Information 2014 [place], [place]. Try zeasorb powders and phisohex or phisoderm wash Return prn call with problem

## 2012-08-03 NOTE — Progress Notes (Signed)
Subjective:     Patient ID: [name], female   DOB: Jun 26, 1965, 47 y.o.   MRN: [id number]  HPI [name] is back in follow up for folliculitis of her hysterectomy scar.She is complaining of areas under her arms and on her breast the look  like black heads.They are not painful at present.  Review of Systems Positives as in HPI   Reviewed past medical,surgical, social and family history. Reviewed medications and allergies.  Objective:   Physical Exam BP 120/84  Ht 5\' 5"  (1.651 m)  Wt 220 lb (99.791 kg)  BMI 36.61 kg/m2skin warm and dry.   Resolved folliculitis at scar, looks like hidradenitis on both breast and underarms, looks chronic.Discussed with her that this is chronic and showed her pictures. And she knows she may need an antibiotic with flares. Assessment:     Resolved folliculitis Hidradenitis    Plan:      Do not shave Try phisohex wash Try zeasorb powders Call with any problems, return prn

## 2012-08-04 ENCOUNTER — Ambulatory Visit (INDEPENDENT_AMBULATORY_CARE_PROVIDER_SITE_OTHER): Payer: Medicare Other | Admitting: Nurse Practitioner

## 2012-08-04 ENCOUNTER — Encounter: Payer: Self-pay | Admitting: Nurse Practitioner

## 2012-08-04 VITALS — BP 128/80 | HR 80 | Wt 221.0 lb

## 2012-08-04 DIAGNOSIS — G5690 Unspecified mononeuropathy of unspecified upper limb: Secondary | ICD-10-CM

## 2012-08-04 DIAGNOSIS — Z79899 Other long term (current) drug therapy: Secondary | ICD-10-CM

## 2012-08-04 DIAGNOSIS — I10 Essential (primary) hypertension: Secondary | ICD-10-CM | POA: Diagnosis not present

## 2012-08-04 DIAGNOSIS — G5691 Unspecified mononeuropathy of right upper limb: Secondary | ICD-10-CM

## 2012-08-04 DIAGNOSIS — E119 Type 2 diabetes mellitus without complications: Secondary | ICD-10-CM | POA: Diagnosis not present

## 2012-08-04 DIAGNOSIS — R5381 Other malaise: Secondary | ICD-10-CM

## 2012-08-04 DIAGNOSIS — M542 Cervicalgia: Secondary | ICD-10-CM

## 2012-08-04 DIAGNOSIS — R5383 Other fatigue: Secondary | ICD-10-CM

## 2012-08-04 NOTE — Patient Instructions (Signed)
Low dose ASA 81 mg.  Coated preferred

## 2012-08-05 ENCOUNTER — Other Ambulatory Visit: Payer: Self-pay | Admitting: Family Medicine

## 2012-08-05 ENCOUNTER — Ambulatory Visit (HOSPITAL_COMMUNITY)
Admission: RE | Admit: 2012-08-05 | Discharge: 2012-08-05 | Disposition: A | Discharge status: Home / Self Care | Payer: Medicare Other | Source: Ambulatory Visit | Attending: Nurse Practitioner | Admitting: Nurse Practitioner

## 2012-08-05 ENCOUNTER — Encounter: Payer: Self-pay | Admitting: Nurse Practitioner

## 2012-08-05 DIAGNOSIS — R5383 Other fatigue: Secondary | ICD-10-CM | POA: Diagnosis not present

## 2012-08-05 DIAGNOSIS — Z79899 Other long term (current) drug therapy: Secondary | ICD-10-CM | POA: Diagnosis not present

## 2012-08-05 DIAGNOSIS — M47812 Spondylosis without myelopathy or radiculopathy, cervical region: Secondary | ICD-10-CM | POA: Diagnosis not present

## 2012-08-05 DIAGNOSIS — M25519 Pain in unspecified shoulder: Secondary | ICD-10-CM | POA: Diagnosis not present

## 2012-08-05 DIAGNOSIS — I10 Essential (primary) hypertension: Secondary | ICD-10-CM | POA: Diagnosis not present

## 2012-08-05 DIAGNOSIS — M542 Cervicalgia: Secondary | ICD-10-CM | POA: Insufficient documentation

## 2012-08-05 DIAGNOSIS — R5381 Other malaise: Secondary | ICD-10-CM | POA: Diagnosis not present

## 2012-08-05 DIAGNOSIS — E119 Type 2 diabetes mellitus without complications: Secondary | ICD-10-CM | POA: Diagnosis not present

## 2012-08-05 DIAGNOSIS — Z139 Encounter for screening, unspecified: Secondary | ICD-10-CM

## 2012-08-05 LAB — CBC WITH DIFFERENTIAL/PLATELET
Basophils Absolute: 0 10*3/uL (ref 0.0–0.1)
Basophils Relative: 0 % (ref 0–1)
Eosinophils Absolute: 0.1 10*3/uL (ref 0.0–0.7)
Hemoglobin: 12.4 g/dL (ref 12.0–15.0)
MCH: 30.8 pg (ref 26.0–34.0)
MCHC: 34.6 g/dL (ref 30.0–36.0)
Monocytes Relative: 10 % (ref 3–12)
Neutro Abs: 3 10*3/uL (ref 1.7–7.7)
Neutrophils Relative %: 55 % (ref 43–77)
Platelets: 285 10*3/uL (ref 150–400)
RDW: 13.9 % (ref 11.5–15.5)

## 2012-08-05 NOTE — Progress Notes (Signed)
Subjective:  Presents for routine lab work. Currently on Mobic. Orthopedic specialist requesting routine labs for high risk meds. Patient complaining of generalized weakness in the right arm 1-2 times per week for the past 3 months. Begins with neck pain more so on the right side. No numbness or pain in the arm. Lasted approximately 5 minutes. Has not identified any specific trigger. No numbness or weakness of the face. No difficulty speaking or swallowing. Mild occasional numbness on the lateral right leg, otherwise no weakness.  Objective:   BP 128/80  Pulse 80  Wt 221 lb (100.245 kg)  BMI 36.78 kg/m2 NAD. Alert, oriented. Lungs clear. Heart regular rate rhythm. Extremely tight tender muscles noted all along the upper back and neck area including the trapezius and cervical area on the right side. Good ROM the neck and right shoulder with some tenderness noted. Upper extremity muscle strength 5+ bilateral. Reflexes normal limit. Radial pulses strong. Sensation grossly intact.  Assessment:Neuropathy, arm, right  Diabetes - Plan: Microalbumin, urine, Basic metabolic panel, Hemoglobin A1c, Lipid panel, Microalbumin, urine, Basic metabolic panel, Hemoglobin A1c, Lipid panel  HYPERTENSION - Plan: Basic metabolic panel, Hepatic function panel, Lipid panel, Basic metabolic panel, Hepatic function panel, Lipid panel  Other malaise and fatigue - Plan: CBC with Differential, Basic metabolic panel, Hepatic function panel, TSH, CBC with Differential, Basic metabolic panel, Hepatic function panel, TSH  High risk medication use - Plan: CBC with Differential, Basic metabolic panel, Hepatic function panel, CBC with Differential, Basic metabolic panel, Hepatic function panel  Neck pain - Plan: DG Cervical Spine Complete  Plan: Reviewed warning signs of TIA as a precaution. Based on examination and history, feel neuropathic symptoms are probably coming from the neck or shoulder. X-ray of cervical spine pending.  Recheck in 3-4 months, call back sooner if any problems.

## 2012-08-05 NOTE — Assessment & Plan Note (Signed)
Labs pending. Continue current medications.

## 2012-08-05 NOTE — Assessment & Plan Note (Signed)
Labs pending. Continue current medications. 

## 2012-08-06 LAB — BASIC METABOLIC PANEL
Calcium: 9.4 mg/dL (ref 8.4–10.5)
Creat: 0.76 mg/dL (ref 0.50–1.10)
Sodium: 136 mEq/L (ref 135–145)

## 2012-08-06 LAB — HEMOGLOBIN A1C: Mean Plasma Glucose: 123 mg/dL — ABNORMAL HIGH (ref <117)

## 2012-08-06 LAB — LIPID PANEL
Cholesterol: 118 mg/dL (ref 0–200)
Total CHOL/HDL Ratio: 3.9 Ratio
Triglycerides: 109 mg/dL (ref <150)
VLDL: 22 mg/dL (ref 0–40)

## 2012-08-06 LAB — HEPATIC FUNCTION PANEL
Alkaline Phosphatase: 47 U/L (ref 39–117)
Bilirubin, Direct: 0.1 mg/dL (ref 0.0–0.3)
Indirect Bilirubin: 0.4 mg/dL (ref 0.0–0.9)
Total Bilirubin: 0.5 mg/dL (ref 0.3–1.2)
Total Protein: 7.1 g/dL (ref 6.0–8.3)

## 2012-08-06 LAB — MICROALBUMIN, URINE: Microalb, Ur: 0.73 mg/dL (ref 0.00–1.89)

## 2012-08-08 NOTE — Progress Notes (Signed)
Left message to return call 

## 2012-08-09 ENCOUNTER — Ambulatory Visit (HOSPITAL_COMMUNITY)
Admission: RE | Admit: 2012-08-09 | Discharge: 2012-08-09 | Disposition: A | Discharge status: Home / Self Care | Payer: Medicare Other | Source: Ambulatory Visit | Attending: Family Medicine | Admitting: Family Medicine

## 2012-08-09 DIAGNOSIS — Z139 Encounter for screening, unspecified: Secondary | ICD-10-CM

## 2012-08-09 DIAGNOSIS — Z1231 Encounter for screening mammogram for malignant neoplasm of breast: Secondary | ICD-10-CM | POA: Insufficient documentation

## 2012-08-18 DIAGNOSIS — M1710 Unilateral primary osteoarthritis, unspecified knee: Secondary | ICD-10-CM | POA: Diagnosis not present

## 2012-10-14 ENCOUNTER — Other Ambulatory Visit: Payer: Self-pay | Admitting: Family Medicine

## 2012-11-08 ENCOUNTER — Encounter: Payer: Self-pay | Admitting: Family Medicine

## 2012-11-08 ENCOUNTER — Ambulatory Visit (INDEPENDENT_AMBULATORY_CARE_PROVIDER_SITE_OTHER): Payer: Medicare Other | Admitting: Family Medicine

## 2012-11-08 VITALS — BP 130/80 | Ht 65.0 in | Wt 216.2 lb

## 2012-11-08 DIAGNOSIS — E119 Type 2 diabetes mellitus without complications: Secondary | ICD-10-CM | POA: Diagnosis not present

## 2012-11-08 DIAGNOSIS — K668 Other specified disorders of peritoneum: Secondary | ICD-10-CM

## 2012-11-08 DIAGNOSIS — R5381 Other malaise: Secondary | ICD-10-CM | POA: Diagnosis not present

## 2012-11-08 DIAGNOSIS — IMO0002 Reserved for concepts with insufficient information to code with codable children: Secondary | ICD-10-CM

## 2012-11-08 DIAGNOSIS — M1710 Unilateral primary osteoarthritis, unspecified knee: Secondary | ICD-10-CM | POA: Diagnosis not present

## 2012-11-08 LAB — POCT GLYCOSYLATED HEMOGLOBIN (HGB A1C): Hemoglobin A1C: 5.6

## 2012-11-08 NOTE — Progress Notes (Signed)
  Subjective:    Patient ID: [name], female    DOB: 15-Oct-1965, 47 y.o.   MRN: [id number]  Diabetes She presents for her follow-up diabetic visit. She has type 2 diabetes mellitus. Her disease course has been stable. There are no hypoglycemic associated symptoms. There are no diabetic associated symptoms. There are no hypoglycemic complications. Symptoms are stable. There are no diabetic complications. There are no known risk factors for coronary artery disease. Current diabetic treatment includes insulin injections. She is compliant with treatment all of the time.   She relates that she's been doing a good job watching her diet and she's also try and lose weight she is tolerating medication well. She has had an occasional low sugar and occasionally her sugars are in the 180s. The patient was seen today as part of a comprehensive diabetic check up. The patient had the following elements completed: -Review of medication compliance -Review of glucose monitoring results -Review of any complications do to high or low sugars -Diabetic foot exam was completed as part of today's visit. The following was also discussed: -Importance of yearly eye exams -Importance of following diabetic/low sugar-starch diet -Importance of exercise and regular activity -Importance of regular followup visits. -Most recent hemoglobin A1c were reviewed with the patient along with goals regarding diabetes.  Patient has a spot on her left breast that has been present for about 2 months now. Patient relates that the bruise that comes and goes. She denies any lumps in her breasts she keeps up with grams regular breast exams Patient has a spot on her right foot that is bothering her and it has been present for about 2 months now. She relates more soreness in the metatarsal region on the right foot.  Patient has a cyst that appears on the left side of her abdomen and bursts from time to time. It has been going on for  several months now. On the surgical scar there is an area that will drain periodically. Medications were reviewed she relates compliance with medicines PMH migraines, hypertension, diabetes Review of Systems See above. She denies fever chills vomiting diarrhea.    Objective:   Physical Exam Lungs are clear hearts regular pulse normal bruise noted on the left breast the bottom of the foot tender on the right side nontender on the left pulses good surgical scar lower abdomen shows a small area that's a cyst that drained. A1c looks very good.       Assessment & Plan:  #1 diabetes very good control continue current measures. Reduce Victoza from 1.8 mg to 1.2. See patient back in approximately 4 months check A1c at that time #2 bruising-could be related to the aspirin or Mobic. I would recommend checking CBC to make sure platelets are not abnormally low #3 metatarsalgia right foot continue anti-inflammatory may need to see podiatry if ongoing. #4 surgical scar with reoccurring infection at the same spot-referral back to gynecology who did the surgery. I believe this patient would benefit from a wedge resection of this region. In addition to this Bactroban ointment when it flares up let us know if any serious issues call if any problems Followup 4 months

## 2012-11-09 LAB — CBC WITH DIFFERENTIAL/PLATELET
Basophils Relative: 1 % (ref 0–1)
Eosinophils Relative: 4 % (ref 0–5)
HCT: 37.3 % (ref 36.0–46.0)
Hemoglobin: 13 g/dL (ref 12.0–15.0)
MCH: 31 pg (ref 26.0–34.0)
MCHC: 34.9 g/dL (ref 30.0–36.0)
MCV: 89 fL (ref 78.0–100.0)
Monocytes Absolute: 0.8 10*3/uL (ref 0.1–1.0)
Monocytes Relative: 12 % (ref 3–12)
Neutro Abs: 3.6 10*3/uL (ref 1.7–7.7)

## 2012-11-11 LAB — WOUND CULTURE
Gram Stain: NONE SEEN
Gram Stain: NONE SEEN

## 2012-11-16 DIAGNOSIS — M1710 Unilateral primary osteoarthritis, unspecified knee: Secondary | ICD-10-CM | POA: Diagnosis not present

## 2012-11-23 ENCOUNTER — Ambulatory Visit: Payer: Medicare Other | Admitting: Obstetrics and Gynecology

## 2012-11-23 DIAGNOSIS — M1710 Unilateral primary osteoarthritis, unspecified knee: Secondary | ICD-10-CM | POA: Diagnosis not present

## 2012-11-24 ENCOUNTER — Ambulatory Visit (INDEPENDENT_AMBULATORY_CARE_PROVIDER_SITE_OTHER): Payer: Medicare Other | Admitting: Obstetrics and Gynecology

## 2012-11-24 ENCOUNTER — Encounter: Payer: Self-pay | Admitting: Obstetrics and Gynecology

## 2012-11-24 VITALS — BP 130/82 | Ht 65.0 in | Wt 217.0 lb

## 2012-11-24 DIAGNOSIS — L738 Other specified follicular disorders: Secondary | ICD-10-CM

## 2012-11-24 DIAGNOSIS — L730 Acne keloid: Secondary | ICD-10-CM

## 2012-11-24 MED ORDER — SULFAMETHOXAZOLE-TMP DS 800-160 MG PO TABS: 1.0000 | ORAL_TABLET | Freq: Two times a day (BID) | ORAL | Status: DC

## 2012-11-24 NOTE — Progress Notes (Signed)
Patient ID: [name], female   DOB: 04-11-1965, 47 y.o.   MRN: [id number] Pt here today for an incision check. Pt states that she has a place on her incision that still has not healed. Pt states that the area is on the left side of the incision and is about the size of a quarter. Pt states that she has not had any drainage form the area but that it is very irritating. Pt states that she has put a hot compress on it and took antibiotics but nothing has changed.   Purulence in prior incision over a 2 cm area. PT verbal consent to I & D obtrained, witnessed Time out. Prepped, local anesth 3 cc 1% with epi  Excision of ellipse of overlying scar 2 cm x 8 mm x 1 cm deep' Additional local used Clean base identified. Packed with Iodoform gauze Rx septra x 10 d  Remove gauze 48 hr Instructed in care F/u 1 wk.

## 2012-11-28 ENCOUNTER — Telehealth: Payer: Self-pay | Admitting: Obstetrics and Gynecology

## 2012-11-28 NOTE — Telephone Encounter (Signed)
Pt states Dr. Emelda Fear prescribed Bactrim at last appt.  Pt states broke out in a rash, thinks allergic reaction to med. Pt has stopped medication. Pt states has an appt with Dr. Emelda Fear for Thursday. Pt informed can try OTC Benadryl and to keep her appt with Dr. Emelda Fear for this Thursday. Pt verbalized understanding.

## 2012-11-29 ENCOUNTER — Telehealth: Payer: Self-pay | Admitting: *Deleted

## 2012-11-29 NOTE — Telephone Encounter (Signed)
Tell pt Dr. Lorin Picket didn't find evidence to support routine use of phisophex.

## 2012-11-29 NOTE — Telephone Encounter (Signed)
[facility] on 11/15/12 and 11/29/12

## 2012-12-01 ENCOUNTER — Ambulatory Visit (INDEPENDENT_AMBULATORY_CARE_PROVIDER_SITE_OTHER): Payer: Medicare Other | Admitting: *Deleted

## 2012-12-01 ENCOUNTER — Encounter: Payer: Self-pay | Admitting: Obstetrics and Gynecology

## 2012-12-01 ENCOUNTER — Ambulatory Visit (INDEPENDENT_AMBULATORY_CARE_PROVIDER_SITE_OTHER): Payer: Medicare Other | Admitting: Obstetrics and Gynecology

## 2012-12-01 VITALS — BP 130/90 | Ht 65.0 in | Wt 216.8 lb

## 2012-12-01 DIAGNOSIS — Z23 Encounter for immunization: Secondary | ICD-10-CM

## 2012-12-01 DIAGNOSIS — L0291 Cutaneous abscess, unspecified: Secondary | ICD-10-CM

## 2012-12-01 DIAGNOSIS — N898 Other specified noninflammatory disorders of vagina: Secondary | ICD-10-CM

## 2012-12-01 LAB — POCT WET PREP (WET MOUNT)
Bacteria Wet Prep HPF POC: NEGATIVE
WBC, Wet Prep HPF POC: NEGATIVE

## 2012-12-01 MED ORDER — NYSTATIN-TRIAMCINOLONE 100000-0.1 UNIT/GM-% EX OINT: TOPICAL_OINTMENT | Freq: Two times a day (BID) | CUTANEOUS | Status: DC

## 2012-12-01 NOTE — Progress Notes (Signed)
   [facility] Visit  Patient name: [name] MRN [id number]  Date of birth: 05-18-1965  CC & HPI:  [name] is a 47 y.o. female presenting today for followup of yeast, patient also notes that she is having what feels like a yeast infection that showed up shortly after beginning the antibiotics were we did an incision and drainage on her chronic wound and recurrent wound abscess at her prior Pfannenstiel incision.  ROS:  Patient is a small excoriated area on the back of her neck that does not appear to be deep infection  Pertinent History Reviewed:  Medical & Surgical Hx:  Reviewed: Significant for Pfannenstiel incision patient does not recall the reason Medications: Reviewed & Updated - see associated section Social History: Reviewed -  reports that she has never smoked. She has never used smokeless tobacco.  Objective Findings:  Vitals: BP 130/90  Ht 5\' 5"  (1.651 m)  Wt 216 lb 12.8 oz (98.34 kg)  BMI 36.08 kg/m2  Physical Examination: General appearance - alert, well appearing, and in no distress and oriented to person, place, and time Abdomen: A 2 cm excised area on the old cicatrix is healing nicely. The knee that there is a small pinhole opening in the lower abdominal skin crease that she is no surrounding erythema. This is either breakdown from the chronic moisture or a sinus tract from a deeper problem. For now we'll follow to allow to heal up and reassess in 2-3 months   Assessment & Plan:   1. Satisfactory healing from excision of skin abscess 2. Possible sinus tract into lower abdominal skin crease versus skin breakdown from moisture 3. Excoriation on the back of shoulders will treat with topical Mycolog

## 2012-12-01 NOTE — Patient Instructions (Signed)
Used topical Mycolog on the area of irritation on the back of the neck

## 2012-12-01 NOTE — Telephone Encounter (Signed)
Discussed with patient

## 2013-01-13 ENCOUNTER — Telehealth: Payer: Self-pay | Admitting: *Deleted

## 2013-01-13 MED ORDER — HYDROCODONE-ACETAMINOPHEN 5-325 MG PO TABS: 1.0000 | ORAL_TABLET | ORAL | Status: DC | PRN

## 2013-01-13 NOTE — Telephone Encounter (Signed)
Pt needs a refill on prescription diabetic rx strips and lances [employer]  Also she needs a refill on hydroco/aceta 5-325 tablets  [facility]   Pt [phone]

## 2013-01-13 NOTE — Telephone Encounter (Signed)
Script faxed for diabetic testing supplies and hydrocodone rx ready for pick up.

## 2013-01-13 NOTE — Telephone Encounter (Signed)
She may have a one refill of her hydrocodone she will need to do an office visit to get additional hydrocodone. She may also have prescription for diabetes strips and supplies

## 2013-01-13 NOTE — Telephone Encounter (Signed)
Last office visit 11-08-12

## 2013-02-15 ENCOUNTER — Other Ambulatory Visit: Payer: Self-pay | Admitting: Family Medicine

## 2013-03-01 ENCOUNTER — Ambulatory Visit: Payer: Medicare Other | Admitting: Obstetrics and Gynecology

## 2013-03-07 ENCOUNTER — Encounter: Payer: Self-pay | Admitting: Nurse Practitioner

## 2013-03-07 ENCOUNTER — Ambulatory Visit (INDEPENDENT_AMBULATORY_CARE_PROVIDER_SITE_OTHER): Payer: Medicare Other | Admitting: Nurse Practitioner

## 2013-03-07 VITALS — BP 112/70 | Temp 97.6°F | Ht 65.0 in | Wt 219.1 lb

## 2013-03-07 DIAGNOSIS — E119 Type 2 diabetes mellitus without complications: Secondary | ICD-10-CM | POA: Diagnosis not present

## 2013-03-07 DIAGNOSIS — R040 Epistaxis: Secondary | ICD-10-CM | POA: Diagnosis not present

## 2013-03-07 DIAGNOSIS — IMO0002 Reserved for concepts with insufficient information to code with codable children: Secondary | ICD-10-CM

## 2013-03-07 DIAGNOSIS — J309 Allergic rhinitis, unspecified: Secondary | ICD-10-CM | POA: Diagnosis not present

## 2013-03-07 DIAGNOSIS — K651 Peritoneal abscess: Secondary | ICD-10-CM | POA: Diagnosis not present

## 2013-03-07 DIAGNOSIS — J300 Vasomotor rhinitis: Secondary | ICD-10-CM

## 2013-03-07 LAB — CBC WITH DIFFERENTIAL/PLATELET
BASOS ABS: 0 10*3/uL (ref 0.0–0.1)
BASOS PCT: 1 % (ref 0–1)
Eosinophils Absolute: 0.3 10*3/uL (ref 0.0–0.7)
Eosinophils Relative: 4 % (ref 0–5)
HEMATOCRIT: 37.5 % (ref 36.0–46.0)
Hemoglobin: 12.7 g/dL (ref 12.0–15.0)
Lymphocytes Relative: 29 % (ref 12–46)
Lymphs Abs: 1.7 10*3/uL (ref 0.7–4.0)
MCH: 30.6 pg (ref 26.0–34.0)
MCHC: 33.9 g/dL (ref 30.0–36.0)
MCV: 90.4 fL (ref 78.0–100.0)
MONO ABS: 0.5 10*3/uL (ref 0.1–1.0)
Monocytes Relative: 9 % (ref 3–12)
NEUTROS ABS: 3.4 10*3/uL (ref 1.7–7.7)
Neutrophils Relative %: 57 % (ref 43–77)
PLATELETS: 338 10*3/uL (ref 150–400)
RBC: 4.15 MIL/uL (ref 3.87–5.11)
RDW: 13.1 % (ref 11.5–15.5)
WBC: 5.9 10*3/uL (ref 4.0–10.5)

## 2013-03-07 LAB — HEMOGLOBIN A1C
Hgb A1c MFr Bld: 6.3 % — ABNORMAL HIGH (ref <5.7)
Mean Plasma Glucose: 134 mg/dL — ABNORMAL HIGH (ref <117)

## 2013-03-07 MED ORDER — METHYLPREDNISOLONE ACETATE 40 MG/ML IJ SUSP
40.0000 mg | Freq: Once | INTRAMUSCULAR | Status: AC
  Administered 2013-03-07: 40 mg via INTRAMUSCULAR

## 2013-03-07 NOTE — Patient Instructions (Addendum)
Saline nasal spray Neosporin or vasoline Cool mist humidifier

## 2013-03-11 LAB — WOUND CULTURE
GRAM STAIN: NONE SEEN
Gram Stain: NONE SEEN
Gram Stain: NONE SEEN

## 2013-03-12 ENCOUNTER — Other Ambulatory Visit: Payer: Self-pay | Admitting: Nurse Practitioner

## 2013-03-12 ENCOUNTER — Encounter: Payer: Self-pay | Admitting: Nurse Practitioner

## 2013-03-12 MED ORDER — SULFAMETHOXAZOLE-TMP DS 800-160 MG PO TABS: 1.0000 | ORAL_TABLET | Freq: Two times a day (BID) | ORAL | Status: DC

## 2013-03-12 NOTE — Progress Notes (Signed)
Subjective:  Presents for complaints of sinus symptoms off-and-on for the past few days. Clear runny nose. No fever. Cough worse at night. Some ear pain. No sore throat. Occasional sinus pressure/headache. Having a slight nosebleed almost every day for the past few months. Has noticed some slight bruising, no excessive bleeding or bleeding from the gums when she brushes her teeth. Also has an area in the mid lower abdomen in the middle of her scar tissue from her hysterectomy from several years ago. [facility], states he has been "working on it" for the past year. Minimal tenderness. Frequently draining clear to cloudy pink fluid.  Objective:   BP 112/70  Temp(Src) 97.6 F (36.4 C) (Oral)  Ht 5\' 5"  (1.651 m)  Wt 219 lb 2 oz (99.394 kg)  BMI 36.46 kg/m2 NAD. Alert, oriented. TMs clear effusion, no erythema. Pharynx injected with clear PND noted. Nasal mucosa moderately erythematous, no active bleeding. Neck supple with mild soft anterior adenopathy. Lungs clear. Heart regular rate rhythm. A tiny round open area is noted in the middle of her scar tissue in her lower abdomen above the mons pubis. There is no erythema or tenderness. Drains serosanguineous fluid with minimal pressure. Culture obtained. There appears to be a linear superficial area beneath the opening similar to a superficial fistula. Question whether scar tissue has formed around this area. She had a previous culture done on the same area on 9/30 which was negative.   Assessment:Vasomotor rhinitis - Plan: methylPREDNISolone acetate (DEPO-MEDROL) injection 40 mg  Epistaxis - Plan: CBC with Differential  Diabetes - Plan: Hemoglobin A1c  Abscess, abdomen - Plan: Wound culture  Recommend referral to general surgeon for evaluation of scar tissue. Saline nasal spray Neosporin or vasoline Cool mist humidifier. Recheck if symptoms worsen or persist. Otherwise routine followup in 3-4 months.

## 2013-03-13 NOTE — Progress Notes (Signed)
Patient notified and verbalized understanding of the test results.  

## 2013-03-16 ENCOUNTER — Telehealth: Payer: Self-pay | Admitting: Family Medicine

## 2013-03-16 ENCOUNTER — Other Ambulatory Visit: Payer: Self-pay | Admitting: Nurse Practitioner

## 2013-03-16 MED ORDER — CIPROFLOXACIN HCL 500 MG PO TABS: 500.0000 mg | ORAL_TABLET | Freq: Two times a day (BID) | ORAL | Status: DC

## 2013-03-16 NOTE — Telephone Encounter (Signed)
Patient was seen 1/27 and prescribe bactrim DS 800mg -160 she had allergic reaction to meds after taking it just one day. She broke out into a rash on face, hands, chest. Can you call in something else. Patient wants it noted in her chart that she cant take bactrim. [facility] [place]

## 2013-03-16 NOTE — Telephone Encounter (Signed)
Notified patient her chart has now been flagged as allergic to sulfa. Will send in Rx for different antibiotic. Patient verbalized understanding.

## 2013-03-16 NOTE — Telephone Encounter (Signed)
Her chart has now been flagged as allergic to sulfa. Will send in Rx for different antibiotic

## 2013-03-19 ENCOUNTER — Other Ambulatory Visit: Payer: Self-pay | Admitting: Family Medicine

## 2013-04-14 ENCOUNTER — Encounter: Payer: Self-pay | Admitting: Family Medicine

## 2013-04-20 ENCOUNTER — Other Ambulatory Visit: Payer: Self-pay | Admitting: Family Medicine

## 2013-05-04 DIAGNOSIS — L980 Pyogenic granuloma: Secondary | ICD-10-CM | POA: Diagnosis not present

## 2013-05-05 NOTE — H&P (Signed)
  NTS SOAP Note  Vital Signs:  Vitals as of: 05/04/2013: Systolic 155: Diastolic 94: Heart Rate 86: Temp 98.29F: Height 745ft 5in: Weight 224Lbs 0 Ounces: BMI 37.28  BMI : 37.28 kg/m2  Subjective: This 48 Years 436 Months old Female presents for of a recurrent incisional pain with infection.  s/p TAH in 2010.  Has had recurrent swelling and infection along the midportion of the low transverse incision.  Has been on antibiotics multiple times in the past.  Has had drainage of the wound in the past.   Review of Symptoms:  Constitutional:  fatigue    headache Eyes:  blurred vision bilateral Nose/Mouth/Throat:unremarkable Cardiovascular:  unremarkable   Respiratory:unremarkable   Gastrointestinal:  unremarkable   Genitourinary:unremarkable       joint and back pain Hematolgic/Lymphatic:unremarkable     Allergic/Immunologic:unremarkable     Past Medical History:    Reviewed  Past Medical History  Surgical History: TAH Medical Problems: HTN Allergies: nkda Medications: lisinopril, meloxicam, metoprolol, triamtreine,/HCTZ, baby asa, promethazine, flexeril, cetirizine, vicodin   Social History:Reviewed  Social History  Preferred Language: English Race:  Black or African American Ethnicity: Not Hispanic / Latino Age: 48 Years 6 Months Marital Status:  S   Smoking Status: Never smoker reviewed on 05/04/2013 Functional Status reviewed on 05/04/2013 ------------------------------------------------ Bathing: Normal Cooking: Normal Dressing: Normal Driving: Normal Eating: Normal Managing Meds: Normal Oral Care: Normal Shopping: Normal Toileting: Normal Transferring: Normal Walking: Normal Cognitive Status reviewed on 05/04/2013 ------------------------------------------------ Attention: Normal Decision Making: Normal Language: Normal Memory: Normal Motor: Normal Perception: Normal Problem Solving: Normal Visual and Spatial:  Normal   Family History:  Reviewed  Family Health History Mother, Living; Healthy;  Father, Deceased; Healthy;     Objective Information: General:  Well appearing, well nourished in no distress.      Tenderness along the midportion of the low transverse surgical scar.  No active drainage at this time. Heart:  RRR, no murmur Lungs:    CTA bilaterally, no wheezes, rhonchi, rales.  Breathing unlabored.  Assessment:Incisional pain and cellulitis, ?suture granuloma  Diagnoses: 686.1 Pyogenic granuloma (Pyogenic granuloma)  Procedures: [id number] - OFFICE OUTPATIENT NEW 30 MINUTES    Plan:  Scheduled for wound exploration of the abdominal wall on 05/26/13.   Patient Education:Alternative treatments to surgery were discussed with patient (and family).  Risks and benefits  of procedure including recurrence of the pain were fully explained to the patient (and family) who gave informed consent. Patient/family questions were addressed.  Follow-up:Pending Surgery

## 2013-05-22 ENCOUNTER — Encounter (HOSPITAL_COMMUNITY): Payer: Self-pay | Admitting: Pharmacy Technician

## 2013-05-22 ENCOUNTER — Encounter (HOSPITAL_COMMUNITY): Payer: Self-pay

## 2013-05-22 ENCOUNTER — Encounter (HOSPITAL_COMMUNITY)
Admission: RE | Admit: 2013-05-22 | Discharge: 2013-05-22 | Disposition: A | Discharge status: Home / Self Care | Payer: Medicare Other | Source: Ambulatory Visit | Attending: General Surgery | Admitting: General Surgery

## 2013-05-22 DIAGNOSIS — Z01812 Encounter for preprocedural laboratory examination: Secondary | ICD-10-CM | POA: Insufficient documentation

## 2013-05-22 DIAGNOSIS — Z01810 Encounter for preprocedural cardiovascular examination: Secondary | ICD-10-CM | POA: Diagnosis not present

## 2013-05-22 LAB — CBC WITH DIFFERENTIAL/PLATELET
BASOS ABS: 0 10*3/uL (ref 0.0–0.1)
Basophils Relative: 0 % (ref 0–1)
Eosinophils Absolute: 0.2 10*3/uL (ref 0.0–0.7)
Eosinophils Relative: 3 % (ref 0–5)
HCT: 34.9 % — ABNORMAL LOW (ref 36.0–46.0)
HEMOGLOBIN: 11.9 g/dL — AB (ref 12.0–15.0)
LYMPHS PCT: 29 % (ref 12–46)
Lymphs Abs: 2 10*3/uL (ref 0.7–4.0)
MCH: 30.4 pg (ref 26.0–34.0)
MCHC: 34.1 g/dL (ref 30.0–36.0)
MCV: 89.3 fL (ref 78.0–100.0)
Monocytes Absolute: 0.6 10*3/uL (ref 0.1–1.0)
Monocytes Relative: 9 % (ref 3–12)
NEUTROS ABS: 4.2 10*3/uL (ref 1.7–7.7)
Neutrophils Relative %: 59 % (ref 43–77)
Platelets: 296 10*3/uL (ref 150–400)
RBC: 3.91 MIL/uL (ref 3.87–5.11)
RDW: 13.3 % (ref 11.5–15.5)
WBC: 7 10*3/uL (ref 4.0–10.5)

## 2013-05-22 LAB — BASIC METABOLIC PANEL
BUN: 15 mg/dL (ref 6–23)
CHLORIDE: 100 meq/L (ref 96–112)
CO2: 28 mEq/L (ref 19–32)
CREATININE: 0.72 mg/dL (ref 0.50–1.10)
Calcium: 9.3 mg/dL (ref 8.4–10.5)
GFR calc non Af Amer: >90 mL/min (ref >90)
Glucose, Bld: 126 mg/dL — ABNORMAL HIGH (ref 70–99)
Potassium: 3.9 mEq/L (ref 3.7–5.3)
Sodium: 140 mEq/L (ref 137–147)

## 2013-05-22 NOTE — Patient Instructions (Signed)
[name]  05/22/2013   Your procedure is scheduled on:  05/26/2013  [facility] at  615  AM.  Call this number if you have problems the morning of surgery: [phone]   Remember:   Do not eat food or drink liquids after midnight.   Take these medicines the morning of surgery with A SIP OF WATER: hydrocodone, zyrtec, flexaril, lisinopril, meloxicam, metoprolol, pamelor, phenergan, maxzide   Do not wear jewelry, make-up or nail polish.  Do not wear lotions, powders, or perfumes.   Do not shave 48 hours prior to surgery. Men may shave face and neck.  Do not bring valuables to the hospital.  [facility] any belongings or valuables.               Contacts, dentures or bridgework may not be worn into surgery.  Leave suitcase in the car. After surgery it may be brought to your room.  For patients admitted to the hospital, discharge time is determined by your treatment team.               Patients discharged the day of surgery will not be allowed to drive home.  Name and phone number of your driver: family  Special Instructions: Shower using CHG 2 nights before surgery and the night before surgery.  If you shower the day of surgery use CHG.  Use special wash - you have one bottle of CHG for all showers.  You should use approximately 1/3 of the bottle for each shower.   Please read over the following fact sheets that you were given: Pain Booklet, Coughing and Deep Breathing, Surgical Site Infection Prevention, Anesthesia Post-op Instructions and Care and Recovery After Surgery Diagnostic Laparoscopy Laparoscopy is a surgical procedure. It is used to diagnose and treat diseases inside the belly (abdomen). It is usually a brief, common, and relatively simple procedure. The laparoscopeis a thin, lighted, pencil-sized instrument. It is like a telescope. It is inserted into your abdomen through a small cut (incision). Your caregiver can look at the organs  inside your body through this instrument. He or she can see if there is anything abnormal. Laparoscopy can be done either in a hospital or outpatient clinic. You may be given a mild sedative to help you relax before the procedure. Once in the operating room, you will be given a drug to make you sleep (general anesthesia). Laparoscopy usually lasts less than 1 hour. After the procedure, you will be monitored in a recovery area until you are stable and doing well. Once you are home, it will take 2 to 3 days to fully recover. RISKS AND COMPLICATIONS  Laparoscopy has relatively few risks. Your caregiver will discuss the risks with you before the procedure. Some problems that can occur include:  Infection.  Bleeding.  Damage to other organs.  Anesthetic side effects. PROCEDURE Once you receive anesthesia, your surgeon inflates the abdomen with a harmless gas (carbon dioxide). This makes the organs easier to see. The laparoscope is inserted into the abdomen through a small incision. This allows your surgeon to see into the abdomen. Other small instruments are also inserted into the abdomen through other small openings. Many surgeons attach a video camera to the laparoscope to enlarge the view. During a diagnostic laparoscopy, the surgeon may be looking for inflammation, infection, or cancer. Your surgeon may take tissue samples(biopsies). The samples are sent to a specialist in looking at cells and tissue  samples (pathologist). The pathologist examines them under a microscope. Biopsies can help to diagnose or confirm a disease. AFTER THE PROCEDURE   The gas is released from inside the abdomen.  The incisions are closed with stitches (sutures). Because these incisions are small (usually less than 1/2 inch), there is usually minimal discomfort after the procedure. There may be some mild discomfort in the throat. This is from the tube placed in the throat while you were sleeping. You may have some mild  abdominal discomfort. There may also be discomfort from the instrument placement incisions in the abdomen.  The recovery time is shortened as long as there are no complications.  You will rest in a recovery room until stable and doing well. As long as there are no complications, you may be allowed to go home. FINDING OUT THE RESULTS OF YOUR TEST Not all test results are available during your visit. If your test results are not back during the visit, make an appointment with your caregiver to find out the results. Do not assume everything is normal if you have not heard from your caregiver or the medical facility. It is important for you to follow up on all of your test results. HOME CARE INSTRUCTIONS   Take all medicines as directed.  Only take over-the-counter or prescription medicines for pain, discomfort, or fever as directed by your caregiver.  Resume daily activities as directed.  Showers are preferred over baths.  You may resume sexual activities in 1 week or as directed.  Do not drive while taking narcotics. SEEK MEDICAL CARE IF:   There is increasing abdominal pain.  There is new pain in the shoulders (shoulder strap areas).  You feel lightheaded or faint.  You have the chills.  You or your child has an oral temperature above 102 F (38.9 C).  There is pus-like (purulent) drainage from any of the wounds.  You are unable to pass gas or have a bowel movement.  You feel sick to your stomach (nauseous) or throw up (vomit). MAKE SURE YOU:   Understand these instructions.  Will watch your condition.  Will get help right away if you are not doing well or get worse. Document Released: 05/04/2000 Document Revised: 05/23/2012 Document Reviewed: 01/26/2007 [facility] Patient Information 2014 [employer] [place]. PATIENT INSTRUCTIONS POST-ANESTHESIA  IMMEDIATELY FOLLOWING SURGERY:  Do not drive or operate machinery for the first twenty four hours after surgery.  Do not make  any important decisions for twenty four hours after surgery or while taking narcotic pain medications or sedatives.  If you develop intractable nausea and vomiting or a severe headache please notify your doctor immediately.  FOLLOW-UP:  Please make an appointment with your surgeon as instructed. You do not need to follow up with anesthesia unless specifically instructed to do so.  WOUND CARE INSTRUCTIONS (if applicable):  Keep a dry clean dressing on the anesthesia/puncture wound site if there is drainage.  Once the wound has quit draining you may leave it open to air.  Generally you should leave the bandage intact for twenty four hours unless there is drainage.  If the epidural site drains for more than 36-48 hours please call the anesthesia department.  QUESTIONS?:  Please feel free to call your physician or the hospital operator if you have any questions, and they will be happy to assist you.

## 2013-05-23 ENCOUNTER — Other Ambulatory Visit: Payer: Self-pay | Admitting: Family Medicine

## 2013-05-26 ENCOUNTER — Encounter (HOSPITAL_COMMUNITY): Payer: Medicare Other | Admitting: Certified Registered"

## 2013-05-26 ENCOUNTER — Ambulatory Visit (HOSPITAL_COMMUNITY)
Admission: RE | Admit: 2013-05-26 | Discharge: 2013-05-26 | Disposition: A | Discharge status: Home / Self Care | Payer: Medicare Other | Source: Ambulatory Visit | Attending: General Surgery | Admitting: General Surgery

## 2013-05-26 ENCOUNTER — Encounter (HOSPITAL_COMMUNITY)
Admission: RE | Disposition: A | Discharge status: Home / Self Care | Payer: Self-pay | Source: Ambulatory Visit | Attending: General Surgery

## 2013-05-26 ENCOUNTER — Encounter (HOSPITAL_COMMUNITY): Payer: Self-pay | Admitting: *Deleted

## 2013-05-26 ENCOUNTER — Ambulatory Visit (HOSPITAL_COMMUNITY): Payer: Medicare Other | Admitting: Certified Registered"

## 2013-05-26 DIAGNOSIS — Z79899 Other long term (current) drug therapy: Secondary | ICD-10-CM | POA: Diagnosis not present

## 2013-05-26 DIAGNOSIS — E119 Type 2 diabetes mellitus without complications: Secondary | ICD-10-CM | POA: Diagnosis not present

## 2013-05-26 DIAGNOSIS — F3289 Other specified depressive episodes: Secondary | ICD-10-CM | POA: Insufficient documentation

## 2013-05-26 DIAGNOSIS — T8140XA Infection following a procedure, unspecified, initial encounter: Secondary | ICD-10-CM | POA: Diagnosis not present

## 2013-05-26 DIAGNOSIS — K668 Other specified disorders of peritoneum: Secondary | ICD-10-CM | POA: Diagnosis not present

## 2013-05-26 DIAGNOSIS — F329 Major depressive disorder, single episode, unspecified: Secondary | ICD-10-CM | POA: Insufficient documentation

## 2013-05-26 DIAGNOSIS — I10 Essential (primary) hypertension: Secondary | ICD-10-CM | POA: Insufficient documentation

## 2013-05-26 DIAGNOSIS — L980 Pyogenic granuloma: Secondary | ICD-10-CM | POA: Insufficient documentation

## 2013-05-26 HISTORY — PX: FOREIGN BODY REMOVAL: SHX962

## 2013-05-26 LAB — GLUCOSE, CAPILLARY
Glucose-Capillary: 109 mg/dL — ABNORMAL HIGH (ref 70–99)
Glucose-Capillary: 100 mg/dL — ABNORMAL HIGH (ref 70–99)

## 2013-05-26 SURGERY — FOREIGN BODY REMOVAL ADULT
Anesthesia: General | Site: Abdomen

## 2013-05-26 MED ORDER — POVIDONE-IODINE 10 % OINT PACKET
TOPICAL_OINTMENT | CUTANEOUS | Status: DC | PRN
  Administered 2013-05-26: 1 via TOPICAL

## 2013-05-26 MED ORDER — ONDANSETRON HCL 4 MG/2ML IJ SOLN
INTRAMUSCULAR | Status: DC | PRN
  Administered 2013-05-26: 4 mg via INTRAVENOUS

## 2013-05-26 MED ORDER — ROCURONIUM BROMIDE 50 MG/5ML IV SOLN
INTRAVENOUS | Status: AC
  Filled 2013-05-26: qty 1

## 2013-05-26 MED ORDER — CHLORHEXIDINE GLUCONATE 4 % EX LIQD: 1.0000 "application " | Freq: Once | CUTANEOUS | Status: AC

## 2013-05-26 MED ORDER — ONDANSETRON HCL 4 MG/2ML IJ SOLN
INTRAMUSCULAR | Status: AC
  Filled 2013-05-26: qty 2

## 2013-05-26 MED ORDER — CEFAZOLIN SODIUM-DEXTROSE 2-3 GM-% IV SOLR
2.0000 g | INTRAVENOUS | Status: AC
  Administered 2013-05-26: 2 g via INTRAVENOUS

## 2013-05-26 MED ORDER — PROPOFOL 10 MG/ML IV EMUL
INTRAVENOUS | Status: AC
  Filled 2013-05-26: qty 20

## 2013-05-26 MED ORDER — FENTANYL CITRATE 0.05 MG/ML IJ SOLN
25.0000 ug | INTRAMUSCULAR | Status: DC | PRN
  Administered 2013-05-26: 50 ug via INTRAVENOUS
  Filled 2013-05-26: qty 2

## 2013-05-26 MED ORDER — LACTATED RINGERS IV SOLN
INTRAVENOUS | Status: DC
  Administered 2013-05-26 (×3): via INTRAVENOUS

## 2013-05-26 MED ORDER — LIDOCAINE HCL (CARDIAC) 20 MG/ML IV SOLN
INTRAVENOUS | Status: DC | PRN
  Administered 2013-05-26: 50 mg via INTRAVENOUS

## 2013-05-26 MED ORDER — LIDOCAINE HCL (PF) 1 % IJ SOLN
INTRAMUSCULAR | Status: AC
  Filled 2013-05-26: qty 5

## 2013-05-26 MED ORDER — FENTANYL CITRATE 0.05 MG/ML IJ SOLN
25.0000 ug | INTRAMUSCULAR | Status: AC
  Administered 2013-05-26 (×2): 25 ug via INTRAVENOUS
  Filled 2013-05-26: qty 2

## 2013-05-26 MED ORDER — CEFAZOLIN SODIUM-DEXTROSE 2-3 GM-% IV SOLR
INTRAVENOUS | Status: AC
  Filled 2013-05-26: qty 50

## 2013-05-26 MED ORDER — BUPIVACAINE HCL (PF) 0.5 % IJ SOLN
INTRAMUSCULAR | Status: AC
  Filled 2013-05-26: qty 30

## 2013-05-26 MED ORDER — ONDANSETRON HCL 4 MG/2ML IJ SOLN
4.0000 mg | Freq: Once | INTRAMUSCULAR | Status: AC | PRN
  Administered 2013-05-26: 4 mg via INTRAVENOUS
  Filled 2013-05-26: qty 2

## 2013-05-26 MED ORDER — PROPOFOL 10 MG/ML IV BOLUS
INTRAVENOUS | Status: DC | PRN
  Administered 2013-05-26: 200 mg via INTRAVENOUS

## 2013-05-26 MED ORDER — SUCCINYLCHOLINE CHLORIDE 20 MG/ML IJ SOLN
INTRAMUSCULAR | Status: AC
  Filled 2013-05-26: qty 1

## 2013-05-26 MED ORDER — MIDAZOLAM HCL 2 MG/2ML IJ SOLN
1.0000 mg | INTRAMUSCULAR | Status: DC | PRN
  Administered 2013-05-26: 2 mg via INTRAVENOUS
  Filled 2013-05-26: qty 2

## 2013-05-26 MED ORDER — HYDROCODONE-ACETAMINOPHEN 5-325 MG PO TABS: 1.0000 | ORAL_TABLET | ORAL | Status: DC | PRN

## 2013-05-26 MED ORDER — BUPIVACAINE HCL (PF) 0.5 % IJ SOLN
INTRAMUSCULAR | Status: DC | PRN
  Administered 2013-05-26: 10 mL

## 2013-05-26 MED ORDER — 0.9 % SODIUM CHLORIDE (POUR BTL) OPTIME
TOPICAL | Status: DC | PRN
  Administered 2013-05-26: 1000 mL

## 2013-05-26 MED ORDER — POVIDONE-IODINE 10 % EX OINT
TOPICAL_OINTMENT | CUTANEOUS | Status: AC
  Filled 2013-05-26: qty 1

## 2013-05-26 MED ORDER — KETOROLAC TROMETHAMINE 30 MG/ML IJ SOLN
30.0000 mg | Freq: Once | INTRAMUSCULAR | Status: AC
  Administered 2013-05-26: 30 mg via INTRAVENOUS
  Filled 2013-05-26: qty 1

## 2013-05-26 SURGICAL SUPPLY — 38 items
BAG HAMPER (MISCELLANEOUS) ×2 IMPLANT
CLOTH BEACON ORANGE TIMEOUT ST (SAFETY) ×2 IMPLANT
COVER LIGHT HANDLE STERIS (MISCELLANEOUS) ×4 IMPLANT
DECANTER SPIKE VIAL GLASS SM (MISCELLANEOUS) ×2 IMPLANT
DERMABOND ADVANCED (GAUZE/BANDAGES/DRESSINGS)
DERMABOND ADVANCED .7 DNX12 (GAUZE/BANDAGES/DRESSINGS) IMPLANT
DURAPREP 26ML APPLICATOR (WOUND CARE) ×2 IMPLANT
ELECT NEEDLE TIP 2.8 STRL (NEEDLE) ×2 IMPLANT
ELECT REM PT RETURN 9FT ADLT (ELECTROSURGICAL) ×2
ELECTRODE REM PT RTRN 9FT ADLT (ELECTROSURGICAL) ×1 IMPLANT
FORMALIN 10 PREFIL 480ML (MISCELLANEOUS) ×2 IMPLANT
GLOVE BIO SURGEON STRL SZ7.5 (GLOVE) ×4 IMPLANT
GLOVE BIOGEL PI IND STRL 7.5 (GLOVE) ×1 IMPLANT
GLOVE BIOGEL PI INDICATOR 7.5 (GLOVE) ×1
GLOVE ECLIPSE 7.0 STRL STRAW (GLOVE) ×2 IMPLANT
GLOVE ECLIPSE 8.5 STRL (GLOVE) ×2 IMPLANT
GLOVE EXAM NITRILE MD LF STRL (GLOVE) ×2 IMPLANT
GOWN STRL REUS W/TWL LRG LVL3 (GOWN DISPOSABLE) ×6 IMPLANT
KIT ROOM TURNOVER APOR (KITS) ×2 IMPLANT
MANIFOLD NEPTUNE II (INSTRUMENTS) ×2 IMPLANT
NS IRRIG 1000ML POUR BTL (IV SOLUTION) ×2 IMPLANT
PACK MINOR (CUSTOM PROCEDURE TRAY) ×2 IMPLANT
PAD ARMBOARD 7.5X6 YLW CONV (MISCELLANEOUS) ×2 IMPLANT
SET BASIN LINEN APH (SET/KITS/TRAYS/PACK) ×2 IMPLANT
SOL PREP PROV IODINE SCRUB 4OZ (MISCELLANEOUS) IMPLANT
SPONGE GAUZE 2X2 8PLY STRL LF (GAUZE/BANDAGES/DRESSINGS) ×2 IMPLANT
SPONGE GAUZE 4X4 12PLY (GAUZE/BANDAGES/DRESSINGS) ×2 IMPLANT
STAPLER VISISTAT (STAPLE) ×2 IMPLANT
STRIP CLOSURE SKIN 1/4X3 (GAUZE/BANDAGES/DRESSINGS) ×2 IMPLANT
SUT ETHILON 3 0 FSL (SUTURE) IMPLANT
SUT PROLENE 3 0 PS 1 (SUTURE) IMPLANT
SUT VIC AB 3-0 SH 27 (SUTURE)
SUT VIC AB 3-0 SH 27X BRD (SUTURE) IMPLANT
SUT VIC AB 4-0 PS2 27 (SUTURE) IMPLANT
SUT VICRYL AB 3-0 FS1 BRD 27IN (SUTURE) ×2 IMPLANT
SYR CONTROL 10ML LL (SYRINGE) ×2 IMPLANT
TAPE CLOTH SURG 4X10 WHT LF (GAUZE/BANDAGES/DRESSINGS) ×2 IMPLANT
TOWEL OR 17X26 4PK STRL BLUE (TOWEL DISPOSABLE) ×2 IMPLANT

## 2013-05-26 NOTE — Anesthesia Postprocedure Evaluation (Signed)
  Anesthesia Post-op Note  Patient: Ruby ColaPatricia A Dahle  Procedure(s) Performed: Procedure(s): FOREIGN BODY REMOVAL ADULT ABDOMINAL WALL (N/A)  Patient Location: PACU  Anesthesia Type:General  Level of Consciousness: awake and alert   Airway and Oxygen Therapy: Patient Spontanous Breathing and Patient connected to face mask oxygen  Post-op Pain: none  Post-op Assessment: Post-op Vital signs reviewed, Patient's Cardiovascular Status Stable and Respiratory Function Stable  Post-op Vital Signs: Reviewed and stable  Last Vitals:  Filed Vitals:   05/26/13 0636  BP: 134/79  Pulse: 74  Temp: 37 C  Resp: 18    Complications: No apparent anesthesia complications

## 2013-05-26 NOTE — Op Note (Signed)
Patient:  Ruby Colaatricia A Divis  DOB:  Feb 21, 1965  MRN:  [id number]   Preop Diagnosis:  Granuloma, surgical wound  Postop Diagnosis:  Same  Procedure:  Excision of granuloma, surgical wound  Surgeon:  Franky MachoMark Diondra Pines, M.D.  Anes:  General  Indications:  Patient is a 48 year old black female who underwent a low Pfannenstiel incision for pelvic surgery and now presents with a chronic granuloma and her surgical wound. The risks and benefits of the procedure were fully explained to the patient, who gave informed consent.  Procedure note:  The patient is placed the supine position. After general anesthesia was administered, the lower abdomen was prepped and draped using usual sterile technique with DuraPrep. Surgical site confirmation was performed. Along the midportion of the Pfannenstiel incision, chronic granuloma was seen. The midportion surgical scar was excised down to the subcutaneous tissue. The granuloma was removed. There was no track down to the fascia. The wound is irrigated normal saline. The granuloma was disposed of. 0.5% Sensorcaine was instilled the surrounding wound. The subcutaneous layer was reapproximated using 3-0 Vicryl interrupted suture. The skin was closed using staples. 9 ointment and dry sterile dressings were applied.  All tape and needle counts were correct the end of the procedure. Patient was awakened and transferred to PACU in stable condition.  Complications:  None  EBL:  None  Specimen:  None

## 2013-05-26 NOTE — Interval H&P Note (Signed)
History and Physical Interval Note:  05/26/2013 7:27 AM  [name]  has presented today for surgery, with the diagnosis of granuloma of abdominal wall  The various methods of treatment have been discussed with the patient and family. After consideration of risks, benefits and other options for treatment, the patient has consented to  Procedure(s): FOREIGN BODY REMOVAL ADULT ABDOMINAL WALL (N/A) as a surgical intervention .  The patient's history has been reviewed, patient examined, no change in status, stable for surgery.  I have reviewed the patient's chart and labs.  Questions were answered to the patient's satisfaction.     Dalia HeadingMark A Airika Alkhatib

## 2013-05-26 NOTE — Discharge Instructions (Signed)
Wound Debridement  Wound debridement is a procedure used to remove dead tissue and contaminated substances from a wound. A wound must be clean to heal. It also must get a good supply of blood. Anything that is stopping this must be taken out of the wound. This could be dead tissue, scar tissue, fluid buildup, or debris from outside of the body. Wounds that are not cleaned by debridement heal slowly or not at all. They can become infected. Any infection in the tissue can also spread to nearby areas or to other parts of the body through the blood. Wound debridement can be done through a surgical procedure or various other methods.  Debridement is sometimes done to get a sample of tissue from the wound. The tissue can be checked under a microscope or sent to a lab for testing.   LET YOUR CAREGIVER KNOW ABOUT:    Any allergies you have.   All medicines you are taking, including vitamins, steroids, herbs, eyedrops, and over-the-counter medicines and creams.    Previous problems you or members of your family have had with the use of anesthetics.    Any blood disorders you have had.    Previous surgeries you have had.    Other health problems you have.   RISKS AND COMPLICATIONS   Generally, wound debridement is a safe procedure. However, as with any medical procedure, complications can occur. Possible complications include:   Bleeding that does not stop.    Infection.    Damage to nerves, blood vessels, or healthy tissue inside the wound.    Pain.    Lack of healing.  BEFORE THE PROCEDURE    The caregiver will check the wound for signs of healing or infection. A measurement of the wound will be taken, including how deep it is. A metal tool (probe) may be used. Blood tests may be done to check for infection.    If you will be given medicine to make you sleep through the procedure (general anesthetic), do not eat or drink anything for at least 6 hours before the procedure. Ask your caregiver if it is  okay to have a sip of water with any needed medicine.    Make plans to have someone drive you home after the procedure. Also, make sure someone can stay with you for a few days.   PROCEDURE   The following methods may be used alone or in combination.  Surgical debridement:   Small monitors may be placed on your body. They are used to check your heart, blood pressure, and oxygen level.    You may be given medicine through an intravenous (IV) access tube in your hand or arm.    You might be given medicine to help you relax (sedative).    You may be given medicine to numb the area around the wound (local anesthetic). If the wound is deep or wide, you may be given general anesthetic to make you sleep through the procedure.    Once you are asleep or the wound area is numb, the wound may be washed with a sterile saltwater solution.    Scissors, surgical knives (scalpels), and surgical tweezers (forceps) will be used to remove dead or dying tissue. Any other material that should not be in the wound will also be taken out.    After the tissue and other material have been removed from the wound, the wound will be washed again.    A bandage (dressing) may be placed over   the wound.   Mechanical debridement:  Mechanical debridement may involve various techniques:   A dressing may be used to pull off dead tissue. A moist dressing is placed over the wound. It is left in place until it is dry. When the dressing is lifted off, this lifts away the dead tissue.    Whirlpool baths may be used to flush the wound with forceful streams of hot water.    The wound may be flushed with sterile solution.   Surgical instruments that use water under high pressure may be used to clean the wound.  Chemical debridement:   A chemical medicine is put on the wound. The aim is to dissolve dead or dying tissue. Ointments may also be used.  Autolytic debridement:   A special dressing is used to trap moisture inside the  wound. The goal is for the wound to heal naturally under the dressing. Healing takes longer with this treatment.  AFTER THE PROCEDURE    If a local anesthetic is used, you will be allowed to go home as soon as you are ready. If a general anesthetic is used, you will be taken to a recovery area until you are stable. Your blood pressure and pulse will be checked often. You may continue to get fluids through the IV tube for a while. Once you are stable, you may be able to go home, or you may need to stay in the hospital overnight.Your caregiver will decide when you can go home.    You may feel some pain. You will likely be given medicine for pain.   Before you go home, make sure you know how to care for the wound. This includes knowing when the dressing should be changed and how to change it.    Set up a follow-up appointment before leaving.  Document Released: 04/22/2009 Document Revised: 01/13/2012 Document Reviewed: 10/14/2011  [employer] Patient Information 2014 [employer]

## 2013-05-26 NOTE — Anesthesia Preprocedure Evaluation (Signed)
Anesthesia Evaluation  Patient identified by MRN, date of birth, ID band Patient awake    Reviewed: Allergy & Precautions, H&P , NPO status , Patient's Chart, lab work & pertinent test results, reviewed documented beta blocker date and time   Airway Mallampati: II TM Distance: >3 FB     Dental  (+) Edentulous Upper   Pulmonary sleep apnea ,  breath sounds clear to auscultation        Cardiovascular hypertension, Pt. on medications and Pt. on home beta blockers Rhythm:Regular Rate:Normal     Neuro/Psych  Headaches, PSYCHIATRIC DISORDERS Depression    GI/Hepatic negative GI ROS,   Endo/Other  diabetes, Type 2, Oral Hypoglycemic Agents  Renal/GU      Musculoskeletal   Abdominal   Peds  Hematology   Anesthesia Other Findings   Reproductive/Obstetrics                           Anesthesia Physical Anesthesia Plan  ASA: III  Anesthesia Plan: General   Post-op Pain Management:    Induction: Intravenous  Airway Management Planned: LMA  Additional Equipment:   Intra-op Plan:   Post-operative Plan: Extubation in OR  Informed Consent: I have reviewed the patients History and Physical, chart, labs and discussed the procedure including the risks, benefits and alternatives for the proposed anesthesia with the patient or authorized representative who has indicated his/her understanding and acceptance.     Plan Discussed with:   Anesthesia Plan Comments:         Anesthesia Quick Evaluation

## 2013-05-26 NOTE — Transfer of Care (Signed)
Immediate Anesthesia Transfer of Care Note  Patient: [name]  Procedure(s) Performed: Procedure(s): FOREIGN BODY REMOVAL ADULT ABDOMINAL WALL (N/A)  Patient Location: PACU  Anesthesia Type:General  Level of Consciousness: sedated  Airway & Oxygen Therapy: Patient Spontanous Breathing and Patient connected to face mask oxygen  Post-op Assessment: Report given to PACU RN and Post -op Vital signs reviewed and stable  Post vital signs: Reviewed and stable  Complications: No apparent anesthesia complications

## 2013-05-26 NOTE — Anesthesia Procedure Notes (Signed)
Procedure Name: LMA Insertion Date/Time: 05/26/2013 7:37 AM Performed by: Caren MacadamARTER, Allia Wiltsey W Pre-anesthesia Checklist: Patient identified, Emergency Drugs available, Suction available and Patient being monitored Patient Re-evaluated:Patient Re-evaluated prior to inductionOxygen Delivery Method: Circle System Utilized Preoxygenation: Pre-oxygenation with 100% oxygen Intubation Type: IV induction Ventilation: Mask ventilation without difficulty LMA: LMA inserted LMA Size: 4.0 Number of attempts: 1 Airway Equipment and Method: bite block Placement Confirmation: positive ETCO2 and breath sounds checked- equal and bilateral Tube secured with: Tape Dental Injury: Teeth and Oropharynx as per pre-operative assessment

## 2013-05-29 ENCOUNTER — Encounter (HOSPITAL_COMMUNITY): Payer: Self-pay | Admitting: General Surgery

## 2013-06-23 ENCOUNTER — Other Ambulatory Visit: Payer: Self-pay | Admitting: Family Medicine

## 2013-07-04 ENCOUNTER — Ambulatory Visit (INDEPENDENT_AMBULATORY_CARE_PROVIDER_SITE_OTHER): Payer: Medicare Other | Admitting: Nurse Practitioner

## 2013-07-04 ENCOUNTER — Encounter: Payer: Self-pay | Admitting: Nurse Practitioner

## 2013-07-04 VITALS — BP 130/88 | Ht 65.0 in | Wt 219.0 lb

## 2013-07-04 DIAGNOSIS — Z13220 Encounter for screening for lipoid disorders: Secondary | ICD-10-CM | POA: Diagnosis not present

## 2013-07-04 DIAGNOSIS — J322 Chronic ethmoidal sinusitis: Secondary | ICD-10-CM

## 2013-07-04 DIAGNOSIS — I10 Essential (primary) hypertension: Secondary | ICD-10-CM

## 2013-07-04 DIAGNOSIS — R5383 Other fatigue: Secondary | ICD-10-CM

## 2013-07-04 DIAGNOSIS — E119 Type 2 diabetes mellitus without complications: Secondary | ICD-10-CM

## 2013-07-04 DIAGNOSIS — R5381 Other malaise: Secondary | ICD-10-CM | POA: Diagnosis not present

## 2013-07-04 LAB — POCT GLYCOSYLATED HEMOGLOBIN (HGB A1C): Hemoglobin A1C: 6.1

## 2013-07-04 MED ORDER — CETIRIZINE HCL 10 MG PO TABS: 10.0000 mg | ORAL_TABLET | Freq: Every day | ORAL | Status: DC | PRN

## 2013-07-04 MED ORDER — TRIAMCINOLONE ACETONIDE 0.1 % EX CREA: 1.0000 "application " | TOPICAL_CREAM | Freq: Two times a day (BID) | CUTANEOUS | Status: DC

## 2013-07-04 MED ORDER — METHYLPREDNISOLONE ACETATE 40 MG/ML IJ SUSP
40.0000 mg | Freq: Once | INTRAMUSCULAR | Status: AC
  Administered 2013-07-04: 40 mg via INTRAMUSCULAR

## 2013-07-04 MED ORDER — KETOCONAZOLE 2 % EX CREA: 1.0000 "application " | TOPICAL_CREAM | Freq: Two times a day (BID) | CUTANEOUS | Status: DC

## 2013-07-04 MED ORDER — AZITHROMYCIN 250 MG PO TABS: ORAL_TABLET | ORAL | Status: DC

## 2013-07-04 MED ORDER — LISINOPRIL 2.5 MG PO TABS: ORAL_TABLET | ORAL | Status: DC

## 2013-07-04 MED ORDER — FLUTICASONE PROPIONATE 50 MCG/ACT NA SUSP: 2.0000 | Freq: Every day | NASAL | Status: DC | PRN

## 2013-07-04 NOTE — Progress Notes (Signed)
Subjective:    Patient ID: [name], female    DOB: 08-15-1965, 48 y.o.   MRN: [id number]  Diabetes She presents for her follow-up diabetic visit. She has type 2 diabetes mellitus. Her disease course has been stable. Hypoglycemia symptoms include sweats. There are no diabetic associated symptoms. Symptoms are stable. Current diabetic treatment includes insulin injections. She is compliant with treatment all of the time. She is currently taking insulin pre-breakfast and pre-lunch. Insulin injections are given by patient. Rotation sites for injection include the abdominal wall and thighs. She participates in exercise intermittently. She monitors blood glucose at home 1-2 x per day. Blood glucose monitoring compliance is good. Her breakfast blood glucose range is generally 90-110 mg/dl. She does not see a podiatrist.Eye exam is current.  weight stable. Decreased activity due to ortho issues. No CP/ischemic type pain or SOB. No edema.  Also c/o sinus problems over the past week. Ear pressure with popping. Rare cough. Ethmoid sinus area headache. Sore throat at times. No fever.  Also requests refills on creams for occas rash see previous notes.   Review of Systems See above    Objective:   Physical Exam NAD. Alert, oriented. Left TM normal; clear fluid on the right. Pharynx injected with green PND noted. Neck supple with mild soft adenopathy. Lungs clear. Heart RRR. Lower extremities no edema. See diabetic foot exam.  Results for orders placed in visit on 07/04/13  POCT GLYCOSYLATED HEMOGLOBIN (HGB A1C)      Result Value Ref Range   Hemoglobin A1C 6.1            Assessment & Plan:   Problem List Items Addressed This Visit     Cardiovascular and Mediastinum   HYPERTENSION   Relevant Medications      lisinopril (PRINIVIL,ZESTRIL) tablet   Other Relevant Orders      Basic metabolic panel      Lipid panel     Endocrine   Diabetes - Primary   Relevant Medications   lisinopril (PRINIVIL,ZESTRIL) tablet   Other Relevant Orders      POCT glycosylated hemoglobin (Hb A1C) (Completed)      Basic metabolic panel      Lipid panel      Microalbumin, urine    Other Visit Diagnoses   Fatigue        Relevant Orders       Basic metabolic panel       CBC with Differential       Hepatic function panel       TSH    Need for lipid screening        Relevant Orders       Lipid panel    Ethmoid sinusitis        Relevant Medications       cetirizine (ZYRTEC) tablet       fluticasone (FLONASE) 50 MCG/ACT nasal spray       azithromycin (ZITHROMAX) tablet       methylPREDNISolone acetate (DEPO-MEDROL) injection 40 mg (Completed)      Activity as tolerated. Recommend healthy diet. OTC meds as directed for congestion  Meds ordered this encounter  Medications  . cetirizine (ZYRTEC) 10 MG tablet    Sig: Take 1 tablet (10 mg total) by mouth daily as needed for allergies.    Dispense:  30 tablet    Refill:  11    Order Specific Question:  Supervising Provider    Answer:  Merlyn AlbertLUKING, WILLIAM S [[id number]]  .  fluticasone (FLONASE) 50 MCG/ACT nasal spray    Sig: Place 2 sprays into both nostrils daily as needed for allergies or rhinitis.    Dispense:  16 g    Refill:  5    Order Specific Question:  Supervising Provider    Answer:  Merlyn Albert [[id number]]  . lisinopril (PRINIVIL,ZESTRIL) 2.5 MG tablet    Sig: TAKE ONE TABLET BY MOUTH ONCE DAILY    Dispense:  30 tablet    Refill:  5    Order Specific Question:  Supervising Provider    Answer:  Merlyn Albert [[id number]]  . triamcinolone cream (KENALOG) 0.1 %    Sig: Apply 1 application topically 2 (two) times daily. Prn rash; use up to 2 weeks    Dispense:  30 g    Refill:  0    Order Specific Question:  Supervising Provider    Answer:  Merlyn Albert [[id number]]  . ketoconazole (NIZORAL) 2 % cream    Sig: Apply 1 application topically 2 (two) times daily.    Dispense:  30 g    Refill:  4    Order Specific Question:   Supervising Provider    Answer:  Merlyn Albert [[id number]]  . azithromycin (ZITHROMAX Z-PAK) 250 MG tablet    Sig: Take 2 tablets (500 mg) on  Day 1,  followed by 1 tablet (250 mg) once daily on Days 2 through 5.    Dispense:  6 each    Refill:  0    Order Specific Question:  Supervising Provider    Answer:  Merlyn Albert [[id number]]  . methylPREDNISolone acetate (DEPO-MEDROL) injection 40 mg    Sig:   call back if worsens or persists. Return in about 3 months (around 10/04/2013).

## 2013-07-05 ENCOUNTER — Encounter: Payer: Self-pay | Admitting: Nurse Practitioner

## 2013-07-07 DIAGNOSIS — I10 Essential (primary) hypertension: Secondary | ICD-10-CM | POA: Diagnosis not present

## 2013-07-07 DIAGNOSIS — R5383 Other fatigue: Secondary | ICD-10-CM | POA: Diagnosis not present

## 2013-07-07 DIAGNOSIS — R5381 Other malaise: Secondary | ICD-10-CM | POA: Diagnosis not present

## 2013-07-07 DIAGNOSIS — E119 Type 2 diabetes mellitus without complications: Secondary | ICD-10-CM | POA: Diagnosis not present

## 2013-07-07 DIAGNOSIS — Z13220 Encounter for screening for lipoid disorders: Secondary | ICD-10-CM | POA: Diagnosis not present

## 2013-07-07 LAB — BASIC METABOLIC PANEL
BUN: 19 mg/dL (ref 6–23)
CHLORIDE: 103 meq/L (ref 96–112)
CO2: 27 meq/L (ref 19–32)
Calcium: 9.3 mg/dL (ref 8.4–10.5)
Creat: 0.85 mg/dL (ref 0.50–1.10)
GLUCOSE: 100 mg/dL — AB (ref 70–99)
POTASSIUM: 3.7 meq/L (ref 3.5–5.3)
SODIUM: 137 meq/L (ref 135–145)

## 2013-07-07 LAB — HEPATIC FUNCTION PANEL
ALT: 10 U/L (ref 0–35)
AST: 13 U/L (ref 0–37)
Albumin: 4.4 g/dL (ref 3.5–5.2)
Alkaline Phosphatase: 45 U/L (ref 39–117)
BILIRUBIN INDIRECT: 0.4 mg/dL (ref 0.2–1.2)
Bilirubin, Direct: 0.1 mg/dL (ref 0.0–0.3)
TOTAL PROTEIN: 7.3 g/dL (ref 6.0–8.3)
Total Bilirubin: 0.5 mg/dL (ref 0.2–1.2)

## 2013-07-07 LAB — CBC WITH DIFFERENTIAL/PLATELET
BASOS ABS: 0.1 10*3/uL (ref 0.0–0.1)
Basophils Relative: 1 % (ref 0–1)
EOS ABS: 0.1 10*3/uL (ref 0.0–0.7)
EOS PCT: 2 % (ref 0–5)
HEMATOCRIT: 37 % (ref 36.0–46.0)
Hemoglobin: 12.7 g/dL (ref 12.0–15.0)
Lymphocytes Relative: 27 % (ref 12–46)
Lymphs Abs: 1.8 10*3/uL (ref 0.7–4.0)
MCH: 30.4 pg (ref 26.0–34.0)
MCHC: 34.3 g/dL (ref 30.0–36.0)
MCV: 88.5 fL (ref 78.0–100.0)
Monocytes Absolute: 0.6 10*3/uL (ref 0.1–1.0)
Monocytes Relative: 9 % (ref 3–12)
Neutro Abs: 4 10*3/uL (ref 1.7–7.7)
Neutrophils Relative %: 61 % (ref 43–77)
PLATELETS: 314 10*3/uL (ref 150–400)
RBC: 4.18 MIL/uL (ref 3.87–5.11)
RDW: 13.2 % (ref 11.5–15.5)
WBC: 6.5 10*3/uL (ref 4.0–10.5)

## 2013-07-07 LAB — LIPID PANEL
Cholesterol: 134 mg/dL (ref 0–200)
HDL: 35 mg/dL — ABNORMAL LOW (ref >39)
LDL CALC: 84 mg/dL (ref 0–99)
TRIGLYCERIDES: 74 mg/dL (ref <150)
Total CHOL/HDL Ratio: 3.8 Ratio
VLDL: 15 mg/dL (ref 0–40)

## 2013-07-07 LAB — TSH: TSH: 0.378 u[IU]/mL (ref 0.350–4.500)

## 2013-07-08 ENCOUNTER — Encounter: Payer: Self-pay | Admitting: Nurse Practitioner

## 2013-07-08 LAB — MICROALBUMIN, URINE: MICROALB UR: 1.68 mg/dL (ref 0.00–1.89)

## 2013-07-24 DIAGNOSIS — M1710 Unilateral primary osteoarthritis, unspecified knee: Secondary | ICD-10-CM | POA: Diagnosis not present

## 2013-08-01 ENCOUNTER — Other Ambulatory Visit: Payer: Self-pay | Admitting: Family Medicine

## 2013-08-02 DIAGNOSIS — M1710 Unilateral primary osteoarthritis, unspecified knee: Secondary | ICD-10-CM | POA: Diagnosis not present

## 2013-08-09 DIAGNOSIS — M1710 Unilateral primary osteoarthritis, unspecified knee: Secondary | ICD-10-CM | POA: Diagnosis not present

## 2013-09-15 ENCOUNTER — Other Ambulatory Visit: Payer: Self-pay | Admitting: Family Medicine

## 2013-10-19 ENCOUNTER — Other Ambulatory Visit: Payer: Self-pay | Admitting: Family Medicine

## 2013-10-19 DIAGNOSIS — Z1231 Encounter for screening mammogram for malignant neoplasm of breast: Secondary | ICD-10-CM

## 2013-10-23 ENCOUNTER — Ambulatory Visit (HOSPITAL_COMMUNITY)
Admission: RE | Admit: 2013-10-23 | Discharge: 2013-10-23 | Disposition: A | Discharge status: Home / Self Care | Payer: Medicare Other | Source: Ambulatory Visit | Attending: Family Medicine | Admitting: Family Medicine

## 2013-10-23 ENCOUNTER — Ambulatory Visit (HOSPITAL_COMMUNITY): Payer: Medicare Other

## 2013-10-23 DIAGNOSIS — Z1231 Encounter for screening mammogram for malignant neoplasm of breast: Secondary | ICD-10-CM | POA: Diagnosis not present

## 2013-11-03 ENCOUNTER — Ambulatory Visit: Payer: Medicare Other | Admitting: Nurse Practitioner

## 2013-11-07 DIAGNOSIS — Z794 Long term (current) use of insulin: Secondary | ICD-10-CM | POA: Diagnosis not present

## 2013-11-07 DIAGNOSIS — E119 Type 2 diabetes mellitus without complications: Secondary | ICD-10-CM | POA: Diagnosis not present

## 2013-11-07 LAB — HM DIABETES EYE EXAM

## 2013-11-08 ENCOUNTER — Ambulatory Visit (INDEPENDENT_AMBULATORY_CARE_PROVIDER_SITE_OTHER): Payer: Medicare Other | Admitting: Nurse Practitioner

## 2013-11-08 VITALS — BP 130/82 | Ht 65.0 in | Wt 212.0 lb

## 2013-11-08 DIAGNOSIS — G43909 Migraine, unspecified, not intractable, without status migrainosus: Secondary | ICD-10-CM | POA: Diagnosis not present

## 2013-11-08 DIAGNOSIS — Z23 Encounter for immunization: Secondary | ICD-10-CM | POA: Diagnosis not present

## 2013-11-08 DIAGNOSIS — N951 Menopausal and female climacteric states: Secondary | ICD-10-CM | POA: Diagnosis not present

## 2013-11-08 DIAGNOSIS — E119 Type 2 diabetes mellitus without complications: Secondary | ICD-10-CM | POA: Diagnosis not present

## 2013-11-08 LAB — POCT GLYCOSYLATED HEMOGLOBIN (HGB A1C): HEMOGLOBIN A1C: 5.8

## 2013-11-08 MED ORDER — HYDROCODONE-ACETAMINOPHEN 5-325 MG PO TABS: 1.0000 | ORAL_TABLET | ORAL | Status: DC | PRN

## 2013-11-08 MED ORDER — PROMETHAZINE HCL 25 MG PO TABS: 25.0000 mg | ORAL_TABLET | Freq: Four times a day (QID) | ORAL | Status: DC | PRN

## 2013-11-12 ENCOUNTER — Encounter: Payer: Self-pay | Admitting: Nurse Practitioner

## 2013-11-12 NOTE — Progress Notes (Signed)
Subjective:  Presents for routine followup of her hemoglobin A1c. Continues to work on her weight loss. Has not done very well with her diet lately. Consistent with CPAP for her sleep apnea. Complaints of a flareup of her migraine headaches for the past 3 days. No change in symptomatology. Does have some hot flashes. Also weather has been very unstable. No change in stress level. Has had a hysterectomy but has both ovaries.  Objective:   BP 130/82  Ht 5\' 5"  (1.651 m)  Wt 212 lb (96.163 kg)  BMI 35.28 kg/m2 NAD. Alert, oriented. TMs mild clear effusion, no erythema. Pharynx clear. Nasal mucosa erythema along the lining of the septum bilateral. Neck supple with mild soft anterior adenopathy. Lungs clear. Heart regular rate rhythm. Results for orders placed in visit on 11/08/13  POCT GLYCOSYLATED HEMOGLOBIN (HGB A1C)      Result Value Ref Range   Hemoglobin A1C 5.8       Assessment:  Problem List Items Addressed This Visit     Cardiovascular and Mediastinum   MIGRAINE HEADACHE exacerbation    Relevant Medications      diclofenac (VOLTAREN) 75 MG EC tablet      HYDROcodone-acetaminophen (NORCO/VICODIN) 5-325 MG per tablet     Endocrine   Diabetes - Primary   Relevant Orders      POCT glycosylated hemoglobin (Hb A1C) (Completed)    Other Visit Diagnoses   Hot flushes, perimenopausal          Plan:  Meds ordered this encounter  Medications  . diclofenac (VOLTAREN) 75 MG EC tablet    Sig:   . promethazine (PHENERGAN) 25 MG tablet    Sig: Take 1 tablet (25 mg total) by mouth every 6 (six) hours as needed for nausea.    Dispense:  30 tablet    Refill:  2    Order Specific Question:  Supervising Provider    Answer:  Merlyn AlbertLUKING, WILLIAM S [[id number]]  . HYDROcodone-acetaminophen (NORCO/VICODIN) 5-325 MG per tablet    Sig: Take 1-2 tablets by mouth every 4 (four) hours as needed for moderate pain.    Dispense:  40 tablet    Refill:  0    Order Specific Question:  Supervising Provider   Answer:  Merlyn AlbertLUKING, WILLIAM S [[id number]]   Patient to keep headache diary and bring to next visit. Will need to review paper chart for previous treatment. Warning signs reviewed. Call back in 4-5 days if no improvement, call or go to ED sooner if worse. Flu vaccine given today. Discussed natural supplements which may help hot flashes. Resume OTC antihistamines and Flonase. Return in about 3 months (around 02/07/2014) for physical. Recommend office visit in a few weeks for migraines.

## 2013-12-11 ENCOUNTER — Encounter: Payer: Self-pay | Admitting: Nurse Practitioner

## 2014-01-01 ENCOUNTER — Ambulatory Visit (INDEPENDENT_AMBULATORY_CARE_PROVIDER_SITE_OTHER): Payer: Medicare Other | Admitting: Family Medicine

## 2014-01-01 ENCOUNTER — Encounter: Payer: Self-pay | Admitting: Family Medicine

## 2014-01-01 VITALS — BP 144/90 | Temp 98.7°F | Ht 65.0 in

## 2014-01-01 DIAGNOSIS — L02211 Cutaneous abscess of abdominal wall: Secondary | ICD-10-CM

## 2014-01-01 DIAGNOSIS — J0110 Acute frontal sinusitis, unspecified: Secondary | ICD-10-CM

## 2014-01-01 MED ORDER — DOXYCYCLINE HYCLATE 100 MG PO TABS: 100.0000 mg | ORAL_TABLET | Freq: Two times a day (BID) | ORAL | Status: DC

## 2014-01-01 MED ORDER — LEVOFLOXACIN 500 MG PO TABS: 500.0000 mg | ORAL_TABLET | Freq: Every day | ORAL | Status: DC

## 2014-01-01 MED ORDER — HYDROCODONE-ACETAMINOPHEN 5-325 MG PO TABS: 1.0000 | ORAL_TABLET | ORAL | Status: DC | PRN

## 2014-01-01 MED ORDER — FLUTICASONE PROPIONATE 50 MCG/ACT NA SUSP: 2.0000 | Freq: Every day | NASAL | Status: DC | PRN

## 2014-01-01 NOTE — Progress Notes (Signed)
   Subjective:    Patient ID: Heidi ColaPatricia A Scheer, female    DOB: 06/18/1965, 48 y.o.   MRN: [id number]  Sinusitis This is a new problem. The current episode started in the past 7 days. Associated symptoms include congestion, coughing and headaches. (Fever, body aches)   Patient toward the end of the visit mentions that she has an area on her lower groin region that's tender swollen and she thinks its an abscess she denies fevers and chills   Review of Systems  HENT: Positive for congestion.   Respiratory: Positive for cough.   Neurological: Positive for headaches.       Objective:   Physical Exam mild sinus tenderness eardrums normal throat is normal neck supple lungs clear  Patient also has a abscess in her right lower groin region it is on the surface. No adenopathy with it.      Assessment & Plan:  #1 viral syndrome with sinusitis antibiotics prescribed should gradually get better  #2 abscess with her consent the area was numbed with 1% lidocaine with epinephrine No. 11 blade was used to drain a large amount of pus packing was placed and she was placed on doxycycline twice a day for 7 days she will follow-up in 24 hours for recheck. Patient tolerated procedure well without complications.

## 2014-01-02 ENCOUNTER — Ambulatory Visit (INDEPENDENT_AMBULATORY_CARE_PROVIDER_SITE_OTHER): Payer: Medicare Other | Admitting: Family Medicine

## 2014-01-02 VITALS — Ht 65.0 in

## 2014-01-02 DIAGNOSIS — R112 Nausea with vomiting, unspecified: Secondary | ICD-10-CM

## 2014-01-02 DIAGNOSIS — IMO0002 Reserved for concepts with insufficient information to code with codable children: Secondary | ICD-10-CM

## 2014-01-02 MED ORDER — PROMETHAZINE HCL 25 MG PO TABS: 25.0000 mg | ORAL_TABLET | Freq: Four times a day (QID) | ORAL | Status: DC | PRN

## 2014-01-02 MED ORDER — CLINDAMYCIN HCL 300 MG PO CAPS: 300.0000 mg | ORAL_CAPSULE | Freq: Three times a day (TID) | ORAL | Status: DC

## 2014-01-02 MED ORDER — PROMETHAZINE HCL 25 MG/ML IJ SOLN
25.0000 mg | Freq: Once | INTRAMUSCULAR | Status: AC
  Administered 2014-01-02: 25 mg via INTRAMUSCULAR

## 2014-01-02 MED ORDER — HYDROCODONE-ACETAMINOPHEN 10-325 MG PO TABS: 1.0000 | ORAL_TABLET | ORAL | Status: DC | PRN

## 2014-01-02 NOTE — Progress Notes (Signed)
   Subjective:    Patient ID: Heidi ColaPatricia A Tyler, female    DOB: [birth year], 48 y.o.   MRN: [id number]  HPI  Patient arrives for a recheck boil.she denies high fever chills sweats she does relate nausea she does relate intermittent vomiting and feeling bad  Review of Systems Complains of pain discomfort some nausea no vomiting    Objective:   Physical Exam  The area in her right groin neck she looks much improved is no longer red does have some firm tissue around it there is no fluctuance the packing was removed without difficulty there is no excessive drainage from it.      Assessment & Plan:  This unfortunate patient is having significant nausea with throwing up. It would be advisable that we go ahead and  Give her a shot of Phenergan as well as nausea medicines. In addition to this we are stopping her current antibiotic and changing her to a different antibiotic. Clindamycin 3 times a day I would recommend repeating an examination of this area on Friday morning.pain medications prescribed as well patient was told if she starts having severe pain fever chills to go to the ER  It should be noted that I called the patient on 1125 and checked in on her. She stated that she was starting to feel better not is much pain and states the nausea was diminishing. We will see her back on Friday.

## 2014-01-02 NOTE — Patient Instructions (Signed)
1- stop levaquin and Doxycycline 2- use phenergan for the nausea 3- may use hydrocodone 5 mg tablets take 2 every 4 hours as needed for pain once out then use the other prescription which is hydrocodone 10 mg to take one every 4 hours as needed . dont use the 2 together  4- start clindamycin , one 3 times a day 5- come here Friday am for a recheck

## 2014-01-05 ENCOUNTER — Encounter: Payer: Self-pay | Admitting: Family Medicine

## 2014-01-05 ENCOUNTER — Ambulatory Visit (INDEPENDENT_AMBULATORY_CARE_PROVIDER_SITE_OTHER): Payer: Medicare Other | Admitting: Family Medicine

## 2014-01-05 VITALS — BP 130/86 | Temp 98.4°F | Ht 65.0 in | Wt 214.0 lb

## 2014-01-05 DIAGNOSIS — IMO0002 Reserved for concepts with insufficient information to code with codable children: Secondary | ICD-10-CM

## 2014-01-05 DIAGNOSIS — R1111 Vomiting without nausea: Secondary | ICD-10-CM

## 2014-01-05 NOTE — Progress Notes (Signed)
   Subjective:    Patient ID: Ruby ColaPatricia A Lemler, female    DOB: Jul 29, 1965, 48 y.o.   MRN: [id number]  HPI Patient is here today for a recheck on her nausea and vomiting. Patient states that she feels so much better. Still currently on her medications.  She states she's doing better compared where she was.  Review of Systems She relates some tenderness soreness she denies nausea or vomiting    Objective:   Physical Exam Her abdomen is soft no guarding rebound or tenderness or groin area she has the area that was drained is looking better but. Next with is now developing an abscess       Assessment & Plan:  Nausea induced by medications much better she is tolerating things well She has a second developing abscess the first area was drained adequately with firm induration around it now this indurated area has evolved into a developing abscess I would feel most comfortable with this patient seeing surgery for incision and adequate drainage.

## 2014-01-10 ENCOUNTER — Encounter: Payer: Self-pay | Admitting: Family Medicine

## 2014-01-12 ENCOUNTER — Other Ambulatory Visit: Payer: Self-pay | Admitting: Family Medicine

## 2014-01-16 DIAGNOSIS — L0291 Cutaneous abscess, unspecified: Secondary | ICD-10-CM | POA: Diagnosis not present

## 2014-01-30 ENCOUNTER — Other Ambulatory Visit: Payer: Self-pay | Admitting: Family Medicine

## 2014-01-30 ENCOUNTER — Other Ambulatory Visit: Payer: Self-pay | Admitting: Nurse Practitioner

## 2014-01-31 ENCOUNTER — Encounter (INDEPENDENT_AMBULATORY_CARE_PROVIDER_SITE_OTHER): Payer: Self-pay

## 2014-01-31 ENCOUNTER — Ambulatory Visit (INDEPENDENT_AMBULATORY_CARE_PROVIDER_SITE_OTHER): Payer: Medicare Other | Admitting: Nurse Practitioner

## 2014-01-31 ENCOUNTER — Encounter: Payer: Self-pay | Admitting: Nurse Practitioner

## 2014-01-31 VITALS — BP 142/90 | Ht 65.0 in | Wt 218.0 lb

## 2014-01-31 DIAGNOSIS — Z118 Encounter for screening for other infectious and parasitic diseases: Secondary | ICD-10-CM | POA: Diagnosis not present

## 2014-01-31 DIAGNOSIS — F32A Depression, unspecified: Secondary | ICD-10-CM

## 2014-01-31 DIAGNOSIS — Z0000 Encounter for general adult medical examination without abnormal findings: Secondary | ICD-10-CM

## 2014-01-31 DIAGNOSIS — F329 Major depressive disorder, single episode, unspecified: Secondary | ICD-10-CM | POA: Diagnosis not present

## 2014-01-31 MED ORDER — ESCITALOPRAM OXALATE 10 MG PO TABS: 10.0000 mg | ORAL_TABLET | Freq: Every day | ORAL | Status: DC

## 2014-01-31 NOTE — Progress Notes (Signed)
Subjective:    Patient ID: Heidi ColaPatricia A Tyler, female    DOB: 1965-05-01, 48 y.o.   MRN: [id number]  HPI AWV- Annual Wellness Visit  The patient was seen for their annual wellness visit. The patient's past medical history, surgical history, and family history were reviewed. Pertinent vaccines were reviewed ( tetanus, pneumonia, shingles, flu) The patient's medication list was reviewed and updated.  The height and weight were entered. The patient's current BMI is: 36  Cognitive screening was completed. Outcome of Mini - Cog: PASS Falls within the past 6 months: NO  Current tobacco usage: NO (All patients who use tobacco were given written and verbal information on quitting)  Recent listing of emergency department/hospitalizations over the past year were reviewed. current specialist the patient sees on a regular basis: Orthopedist for her knee about 3 times a year.   Medicare annual wellness visit patient questionnaire was reviewed.  A written screening schedule for the patient for the next 5-10 years was given. Appropriate discussion of followup regarding next visit was discussed.  Presents for her wellness exam. Regular eye exams. Needs dental exam.  See depression screen results. No new sexual partners. Had TAH for bleeding. Has insurance paper; requests STD screening.    Review of Systems  Constitutional: Positive for fatigue. Negative for fever, activity change and appetite change.  HENT: Negative for dental problem, ear pain, sinus pressure and sore throat.   Respiratory: Negative for cough, chest tightness, shortness of breath and wheezing.   Cardiovascular: Negative for chest pain.  Gastrointestinal: Negative for nausea, vomiting, abdominal pain, diarrhea, constipation and abdominal distention.  Genitourinary: Negative for dysuria, urgency, frequency, vaginal discharge, enuresis, difficulty urinating, genital sores and pelvic pain.  Skin:       Chronic superficial  lesions on both breasts. Non tender.  tends to develop keloid tissue.     Objective:   Physical Exam  Constitutional: She is oriented to person, place, and time. She appears well-developed. No distress.  HENT:  Right Ear: External ear normal.  Left Ear: External ear normal.  Mouth/Throat: Oropharynx is clear and moist.  Neck: Normal range of motion. Neck supple. No tracheal deviation present. No thyromegaly present.  Cardiovascular: Normal rate, regular rhythm and normal heart sounds.  Exam reveals no gallop.   No murmur heard. Pulmonary/Chest: Effort normal and breath sounds normal.  Abdominal: Soft. She exhibits no distension. There is no tenderness.  Genitourinary: Vagina normal. No vaginal discharge found.  External GU: no rashes or lesions. Vagina: no discharge. Bimanual exam: no tenderness or masses. Rectal exam deferred; plans colonoscopy.  Musculoskeletal: She exhibits no edema.  Lymphadenopathy:    She has no cervical adenopathy.  Neurological: She is alert and oriented to person, place, and time.  Skin: Skin is warm and dry. No rash noted.  Several hyperpigmented nontender discrete superficial nodules noted on both breasts. Does not involve actual breast tissue.  Psychiatric: She has a normal mood and affect. Her behavior is normal.  Vitals reviewed. Breast exam: no masses; axillae no adenopathy.        Assessment & Plan:  Medicare annual wellness visit, subsequent - Plan: GC/chlamydia probe amp, urine  Screening for chlamydial disease - Plan: GC/chlamydia probe amp, urine  Depression   Meds ordered this encounter  Medications  . escitalopram (LEXAPRO) 10 MG tablet    Sig: Take 1 tablet (10 mg total) by mouth daily.    Dispense:  30 tablet    Refill:  2  Order Specific Question:  Supervising Provider    Answer:  Merlyn AlbertLUKING, WILLIAM S [[id number]]   DC med and call if any problems. Recommend healthy diet, weight loss and daily vitamin D/calcium supplementation.  Regular walking program. Given information on colonoscopy; patient plans to schedule.  Return in about 1 month (around 03/03/2014). Call back sooner if any problems.

## 2014-02-01 LAB — GC/CHLAMYDIA PROBE AMP, URINE
CHLAMYDIA, SWAB/URINE, PCR: NEGATIVE
GC PROBE AMP, URINE: NEGATIVE

## 2014-02-04 ENCOUNTER — Encounter: Payer: Self-pay | Admitting: Nurse Practitioner

## 2014-02-04 DIAGNOSIS — F329 Major depressive disorder, single episode, unspecified: Secondary | ICD-10-CM | POA: Insufficient documentation

## 2014-02-04 DIAGNOSIS — F32A Depression, unspecified: Secondary | ICD-10-CM | POA: Insufficient documentation

## 2014-02-07 ENCOUNTER — Encounter: Payer: Medicare Other | Admitting: Nurse Practitioner

## 2014-02-23 ENCOUNTER — Encounter: Payer: Self-pay | Admitting: Nurse Practitioner

## 2014-02-23 ENCOUNTER — Ambulatory Visit (INDEPENDENT_AMBULATORY_CARE_PROVIDER_SITE_OTHER): Payer: Medicare Other | Admitting: Nurse Practitioner

## 2014-02-23 VITALS — BP 130/90 | Temp 98.3°F | Ht 65.0 in | Wt 213.0 lb

## 2014-02-23 DIAGNOSIS — R040 Epistaxis: Secondary | ICD-10-CM | POA: Diagnosis not present

## 2014-02-23 DIAGNOSIS — R238 Other skin changes: Secondary | ICD-10-CM

## 2014-02-23 DIAGNOSIS — R233 Spontaneous ecchymoses: Secondary | ICD-10-CM

## 2014-02-23 DIAGNOSIS — F329 Major depressive disorder, single episode, unspecified: Secondary | ICD-10-CM | POA: Diagnosis not present

## 2014-02-23 DIAGNOSIS — G479 Sleep disorder, unspecified: Secondary | ICD-10-CM

## 2014-02-23 DIAGNOSIS — F32A Depression, unspecified: Secondary | ICD-10-CM

## 2014-02-23 MED ORDER — ESCITALOPRAM OXALATE 20 MG PO TABS: 20.0000 mg | ORAL_TABLET | Freq: Every day | ORAL | Status: DC

## 2014-02-23 NOTE — Patient Instructions (Signed)
Melatonin 5 mg for sleep 

## 2014-02-25 ENCOUNTER — Encounter: Payer: Self-pay | Admitting: Nurse Practitioner

## 2014-02-25 NOTE — Progress Notes (Signed)
Subjective:  Presents for recheck. Slight improvement in depression. No side effects from Lexapro. Trouble sleeping. Averages about 3 hours per night. Does not nap during the day. Minimal caffeine intake. Also has a few spots on her legs; does not remember any injury. Rare nosebleed. No bleeding from gums. No suicidal thoughts or ideation.  Objective:   BP 130/90 mmHg  Temp(Src) 98.3 F (36.8 C)  Ht 5\' 5"  (1.651 m)  Wt 213 lb (96.616 kg)  BMI 35.44 kg/m2 NAD. Alert, oriented. Lungs clear. Heart RRR. A few discrete nonraised patches of faint petechial rash on legs. No significant bruising. Nasal mucosa dry, no active bleeding.   Assessment:  Problem List Items Addressed This Visit      Other   Depression - Primary   Relevant Medications   escitalopram (LEXAPRO) tablet    Other Visit Diagnoses    Easy bruisability        Relevant Orders    Platelet function assay    Epistaxis        Relevant Orders    Platelet function assay    Sleep disturbance          Plan:  Meds ordered this encounter  Medications  . escitalopram (LEXAPRO) 20 MG tablet    Sig: Take 1 tablet (20 mg total) by mouth daily.    Dispense:  30 tablet    Refill:  2    Order Specific Question:  Supervising Provider    Answer:  Merlyn AlbertLUKING, WILLIAM S [[id number]]   Check platelet count. Increase lexapro to 20 mg. Melatonin as directed for sleep.  Return in about 3 months (around 05/25/2014) for recheck. Call back sooner if no imrprovement.

## 2014-02-26 ENCOUNTER — Telehealth: Payer: Self-pay | Admitting: *Deleted

## 2014-02-26 ENCOUNTER — Other Ambulatory Visit: Payer: Self-pay | Admitting: *Deleted

## 2014-02-26 DIAGNOSIS — R040 Epistaxis: Secondary | ICD-10-CM | POA: Diagnosis not present

## 2014-02-26 DIAGNOSIS — R238 Other skin changes: Secondary | ICD-10-CM | POA: Diagnosis not present

## 2014-02-26 LAB — PLATELET FUNCTION ASSAY: COLLAGEN / ADP: 75 s (ref 0–114)

## 2014-02-26 NOTE — Telephone Encounter (Signed)
Lab calling to report high lab value on collagen/epinephrine > 300

## 2014-02-26 NOTE — Telephone Encounter (Signed)
Tracy from [facility] called and states the PFA needs to be repeated because it was suppose to be spun and it wasn't. He states he canceled test and pt will not be charged. Need to put in new order and pt needs to return to lab.

## 2014-02-27 ENCOUNTER — Other Ambulatory Visit: Payer: Self-pay | Admitting: *Deleted

## 2014-02-27 ENCOUNTER — Other Ambulatory Visit: Payer: Self-pay | Admitting: Nurse Practitioner

## 2014-02-27 DIAGNOSIS — R040 Epistaxis: Secondary | ICD-10-CM

## 2014-02-27 DIAGNOSIS — R238 Other skin changes: Secondary | ICD-10-CM

## 2014-02-27 DIAGNOSIS — R233 Spontaneous ecchymoses: Secondary | ICD-10-CM

## 2014-02-27 MED ORDER — ESCITALOPRAM OXALATE 20 MG PO TABS: 20.0000 mg | ORAL_TABLET | Freq: Every day | ORAL | Status: DC

## 2014-02-27 MED ORDER — LIRAGLUTIDE 18 MG/3ML ~~LOC~~ SOPN: PEN_INJECTOR | SUBCUTANEOUS | Status: DC

## 2014-02-27 MED ORDER — CETIRIZINE HCL 10 MG PO TABS: 10.0000 mg | ORAL_TABLET | Freq: Every day | ORAL | Status: DC | PRN

## 2014-02-27 MED ORDER — FLUTICASONE PROPIONATE 50 MCG/ACT NA SUSP: 2.0000 | Freq: Every day | NASAL | Status: DC | PRN

## 2014-02-27 MED ORDER — LISINOPRIL 2.5 MG PO TABS: ORAL_TABLET | ORAL | Status: DC

## 2014-02-27 MED ORDER — METOPROLOL TARTRATE 100 MG PO TABS: 100.0000 mg | ORAL_TABLET | Freq: Two times a day (BID) | ORAL | Status: DC

## 2014-02-27 NOTE — Telephone Encounter (Signed)
I ordered the correct test which is a regular platelet count not the assay. Thanks.

## 2014-02-27 NOTE — Telephone Encounter (Signed)
Discussed with pt that she needs to go back to [facility] and have blood redrawn. Pt states she will go.

## 2014-02-28 LAB — PLATELET COUNT: Platelets: 356 10*3/uL (ref 150–400)

## 2014-03-17 ENCOUNTER — Telehealth: Payer: Self-pay | Admitting: Family Medicine

## 2014-03-17 NOTE — Telephone Encounter (Signed)
Rx prior auth/formulary exception APPROVED for pt's Liraglutide (VICTOZA) 18 MG/3ML SOPN, auth good until 02/09/15 through [employer], [id number]

## 2014-04-01 ENCOUNTER — Other Ambulatory Visit: Payer: Self-pay | Admitting: Family Medicine

## 2014-05-25 ENCOUNTER — Encounter: Payer: Self-pay | Admitting: Nurse Practitioner

## 2014-05-25 ENCOUNTER — Telehealth: Payer: Self-pay | Admitting: Family Medicine

## 2014-05-25 ENCOUNTER — Other Ambulatory Visit: Payer: Self-pay | Admitting: *Deleted

## 2014-05-25 ENCOUNTER — Ambulatory Visit (INDEPENDENT_AMBULATORY_CARE_PROVIDER_SITE_OTHER): Payer: Medicare Other | Admitting: Nurse Practitioner

## 2014-05-25 VITALS — BP 130/80 | Ht 65.0 in | Wt 217.2 lb

## 2014-05-25 DIAGNOSIS — G4700 Insomnia, unspecified: Secondary | ICD-10-CM

## 2014-05-25 DIAGNOSIS — J300 Vasomotor rhinitis: Secondary | ICD-10-CM | POA: Diagnosis not present

## 2014-05-25 DIAGNOSIS — F329 Major depressive disorder, single episode, unspecified: Secondary | ICD-10-CM

## 2014-05-25 DIAGNOSIS — G4730 Sleep apnea, unspecified: Secondary | ICD-10-CM

## 2014-05-25 DIAGNOSIS — F32A Depression, unspecified: Secondary | ICD-10-CM

## 2014-05-25 DIAGNOSIS — F5104 Psychophysiologic insomnia: Secondary | ICD-10-CM

## 2014-05-25 MED ORDER — TRAZODONE HCL 50 MG PO TABS
ORAL_TABLET | ORAL | Status: DC
Start: 2014-05-25 — End: 2014-11-26

## 2014-05-25 MED ORDER — LORATADINE 10 MG PO TABS: 10.0000 mg | ORAL_TABLET | Freq: Every day | ORAL | Status: DC | PRN

## 2014-05-25 MED ORDER — METHYLPREDNISOLONE ACETATE 40 MG/ML IJ SUSP
40.0000 mg | Freq: Once | INTRAMUSCULAR | Status: AC
Start: 2014-05-25 — End: 2014-05-25
  Administered 2014-05-25: 40 mg via INTRAMUSCULAR

## 2014-05-25 NOTE — Telephone Encounter (Signed)
loratadine (CLARITIN) 10 MG tablet  Resend please [facility] says they did not get this one

## 2014-05-25 NOTE — Telephone Encounter (Signed)
Med resent to pharm. Pt notified.  

## 2014-05-27 ENCOUNTER — Encounter: Payer: Self-pay | Admitting: Nurse Practitioner

## 2014-05-27 NOTE — Progress Notes (Signed)
Subjective:  Presents for recheck. Less sadness and depression on Lexapro. Still sluggish with decreased energy. Average sleep per night is about 4 hours. Wakes up frequently during the night. No naps during the day. Not wearing CPAP most nights. Also head congestion for a few weeks. No fever. Runny nose. Occasional cough. Occasional sinus headache more on left side. Ear pressure.   Objective:   BP 130/80 mmHg  Ht 5\' 5"  (1.651 m)  Wt 217 lb 3.2 oz (98.521 kg)  BMI 36.14 kg/m2 NAD. Alert, oriented. TMs retracted, no erythema. Nasal mucosa pale and boggy. Pharynx injected with cloudy PND. Neck supple with mild anterior adenopathy. Lungs clear. Heart RRR.   Assessment:  Problem List Items Addressed This Visit      Other   Depression - Primary   Relevant Medications   traZODone (DESYREL) 50 MG tablet   Sleep apnea    Other Visit Diagnoses    Chronic insomnia        Vasomotor rhinitis        Relevant Medications    methylPREDNISolone acetate (DEPO-MEDROL) injection 40 mg (Completed)       Plan:  Meds ordered this encounter  Medications  . DISCONTD: loratadine (CLARITIN) 10 MG tablet    Sig: Take 1 tablet (10 mg total) by mouth daily as needed for allergies.    Dispense:  30 tablet    Refill:  11    Order Specific Question:  Supervising Provider    Answer:  Merlyn AlbertLUKING, WILLIAM S [[id number]]  . traZODone (DESYREL) 50 MG tablet    Sig: One po qhs for sleep    Dispense:  30 tablet    Refill:  2    Order Specific Question:  Supervising Provider    Answer:  Merlyn AlbertLUKING, WILLIAM S [[id number]]  . methylPREDNISolone acetate (DEPO-MEDROL) injection 40 mg    Sig:   . diclofenac (VOLTAREN) 75 MG EC tablet    Sig:    Take Claritin and Flonase daily. Start using CPAP for at least a few hours a night and slowly titrate up time each night. Add Trazodone to regimen.  Call back if worsens or persists.  Return in about 3 months (around 08/24/2014) for recheck. Reminded about wellness physical.

## 2014-07-29 LAB — HM DIABETES EYE EXAM

## 2014-07-30 ENCOUNTER — Ambulatory Visit (INDEPENDENT_AMBULATORY_CARE_PROVIDER_SITE_OTHER): Payer: Medicare Other | Admitting: Family Medicine

## 2014-07-30 ENCOUNTER — Encounter: Payer: Self-pay | Admitting: Family Medicine

## 2014-07-30 VITALS — BP 138/98 | Temp 98.4°F | Ht 65.0 in | Wt 224.0 lb

## 2014-07-30 DIAGNOSIS — R300 Dysuria: Secondary | ICD-10-CM

## 2014-07-30 LAB — POCT URINALYSIS DIPSTICK
Spec Grav, UA: 1.01
pH, UA: 5

## 2014-07-30 MED ORDER — CIPROFLOXACIN HCL 500 MG PO TABS: 500.0000 mg | ORAL_TABLET | Freq: Two times a day (BID) | ORAL | Status: AC

## 2014-07-30 NOTE — Progress Notes (Addendum)
   Subjective:    Patient ID: [name], female    DOB: Jul 07, 1965, 49 y.o.   MRN: [id number]  Dysuria  This is a new problem. Episode onset: 3 days. She has tried nothing for the symptoms.   Pt was having vaginal bleeding that looked like a menstrual cycle. Pt states last cycle was 9 years ago. Had bleeding for about 1/2 of a day. No bleeding now.   Diarrhea, abd pain, and headache started about 3 days ago.   Dizziness started today.   No substantial fever  Some abd discomfort  No recent bladder infxn   Review of Systems  Genitourinary: Positive for dysuria.   no vomiting no back pain no loose stools     Objective:   Physical Exam Alert moderate malaise. HEENT normal. Lungs clear. Heart regular rate and rhythm. No CVA tenderness. Positive low abdomen tenderness.   Urine numerous white blood cells per high-power field    Assessment & Plan:  Impression urinary tract infection plan Cipro twice a day for 7 days due to systemic symptoms. No true CVA tenderness. Doubt highly pyelonephritis warning signs discussed. Seen after-hours rather than emergency room [facility]

## 2014-08-11 ENCOUNTER — Other Ambulatory Visit: Payer: Self-pay | Admitting: Family Medicine

## 2014-08-16 ENCOUNTER — Telehealth: Payer: Self-pay | Admitting: Family Medicine

## 2014-08-16 MED ORDER — FLUCONAZOLE 150 MG PO TABS: ORAL_TABLET | ORAL | Status: DC

## 2014-08-16 NOTE — Telephone Encounter (Signed)
Pt called stating that she was recently on an antibiotic and is now experiencing a yeast infection. Pt would like something to be called in for it.   [facility] [place]

## 2014-08-16 NOTE — Telephone Encounter (Signed)
Per Protocol:  Diflucan 150 mg #2 one tablet 3 days apart. Rx sent electronically to pharmacy. Patient notified.

## 2014-08-28 ENCOUNTER — Ambulatory Visit (INDEPENDENT_AMBULATORY_CARE_PROVIDER_SITE_OTHER): Payer: Medicare Other | Admitting: Family Medicine

## 2014-08-28 ENCOUNTER — Encounter: Payer: Self-pay | Admitting: Family Medicine

## 2014-08-28 VITALS — BP 118/84 | Ht 65.0 in | Wt 219.0 lb

## 2014-08-28 DIAGNOSIS — I10 Essential (primary) hypertension: Secondary | ICD-10-CM

## 2014-08-28 DIAGNOSIS — Z79899 Other long term (current) drug therapy: Secondary | ICD-10-CM | POA: Diagnosis not present

## 2014-08-28 DIAGNOSIS — E119 Type 2 diabetes mellitus without complications: Secondary | ICD-10-CM

## 2014-08-28 DIAGNOSIS — M170 Bilateral primary osteoarthritis of knee: Secondary | ICD-10-CM | POA: Insufficient documentation

## 2014-08-28 DIAGNOSIS — Z13220 Encounter for screening for lipoid disorders: Secondary | ICD-10-CM | POA: Diagnosis not present

## 2014-08-28 LAB — POCT GLYCOSYLATED HEMOGLOBIN (HGB A1C): HEMOGLOBIN A1C: 5.9

## 2014-08-28 MED ORDER — TRIAMTERENE-HCTZ 37.5-25 MG PO TABS: 1.0000 | ORAL_TABLET | Freq: Every day | ORAL | Status: DC

## 2014-08-28 MED ORDER — LORATADINE 10 MG PO TABS: 10.0000 mg | ORAL_TABLET | Freq: Every day | ORAL | Status: DC | PRN

## 2014-08-28 MED ORDER — METOPROLOL TARTRATE 100 MG PO TABS: 100.0000 mg | ORAL_TABLET | Freq: Two times a day (BID) | ORAL | Status: DC

## 2014-08-28 MED ORDER — FLUTICASONE PROPIONATE 50 MCG/ACT NA SUSP: 2.0000 | Freq: Every day | NASAL | Status: DC | PRN

## 2014-08-28 NOTE — Progress Notes (Signed)
   Subjective:    Patient ID: Heidi ColaPatricia A Ercole, female    DOB: 04/30/1965, [age] y.o.   MRN: [id number]  Diabetes She presents for her follow-up diabetic visit. She has type 2 diabetes mellitus. Pertinent negatives for hypoglycemia include no confusion. Pertinent negatives for diabetes include no chest pain, no fatigue, no polydipsia, no polyphagia and no weakness. Current diabetic treatments: victoza. She is compliant with treatment all of the time. She is following a diabetic diet. Exercise: at least 2 days a week. Her breakfast blood glucose range is generally 90-110 mg/dl. She does not see a podiatrist.Eye exam is current (june 2016).   Pt states no concerns today.  We discussed her hypertension she is also have arthritis discomfort in her knees present over the past few months she also knows that she has a history of migraines it's been under fairly good control recently and has history hyperlipidemia she tries watch her.  Review of Systems  Constitutional: Negative for activity change, appetite change and fatigue.  HENT: Negative for congestion.   Respiratory: Negative for cough.   Cardiovascular: Negative for chest pain.  Gastrointestinal: Negative for abdominal pain.  Endocrine: Negative for polydipsia and polyphagia.  Neurological: Negative for weakness.  Psychiatric/Behavioral: Negative for confusion.       Objective:   Physical Exam  Constitutional: She appears well-nourished. No distress.  Cardiovascular: Normal rate, regular rhythm and normal heart sounds.   No murmur heard. Pulmonary/Chest: Effort normal and breath sounds normal. No respiratory distress.  Musculoskeletal: She exhibits no edema.  Lymphadenopathy:    She has no cervical adenopathy.  Neurological: She is alert. She exhibits normal muscle tone.  Psychiatric: Her behavior is normal.  Vitals reviewed.         Assessment & Plan:  Diabetes great control continue current activity watch diet try to  exercise bring weight down Migraines under good control continue current measures Patient doing well with blood pressure medicine Mild arthritis of the knees I recommend Tylenol no need for x-rays  History hyperlipidemia check lipid profile  25 minutes spent with patient greater than half in discussion

## 2014-08-30 DIAGNOSIS — E119 Type 2 diabetes mellitus without complications: Secondary | ICD-10-CM | POA: Diagnosis not present

## 2014-08-30 DIAGNOSIS — Z13220 Encounter for screening for lipoid disorders: Secondary | ICD-10-CM | POA: Diagnosis not present

## 2014-08-30 DIAGNOSIS — Z79899 Other long term (current) drug therapy: Secondary | ICD-10-CM | POA: Diagnosis not present

## 2014-08-30 DIAGNOSIS — I10 Essential (primary) hypertension: Secondary | ICD-10-CM | POA: Diagnosis not present

## 2014-08-31 LAB — BASIC METABOLIC PANEL
BUN/Creatinine Ratio: 13 (ref 9–23)
BUN: 15 mg/dL (ref 6–24)
CALCIUM: 10.1 mg/dL (ref 8.7–10.2)
CO2: 26 mmol/L (ref 18–29)
Chloride: 95 mmol/L — ABNORMAL LOW (ref 97–108)
Creatinine, Ser: 1.13 mg/dL — ABNORMAL HIGH (ref 0.57–1.00)
GFR calc Af Amer: 65 mL/min/{1.73_m2} (ref >59)
GFR calc non Af Amer: 57 mL/min/{1.73_m2} — ABNORMAL LOW (ref >59)
Glucose: 117 mg/dL — ABNORMAL HIGH (ref 65–99)
Potassium: 4.1 mmol/L (ref 3.5–5.2)
Sodium: 138 mmol/L (ref 134–144)

## 2014-08-31 LAB — HEPATIC FUNCTION PANEL
ALBUMIN: 4.6 g/dL (ref 3.5–5.5)
ALK PHOS: 65 IU/L (ref 39–117)
ALT: 18 IU/L (ref 0–32)
AST: 17 IU/L (ref 0–40)
Bilirubin Total: 0.4 mg/dL (ref 0.0–1.2)
Bilirubin, Direct: 0.1 mg/dL (ref 0.00–0.40)
TOTAL PROTEIN: 8 g/dL (ref 6.0–8.5)

## 2014-08-31 LAB — LIPID PANEL
Chol/HDL Ratio: 5.1 ratio units — ABNORMAL HIGH (ref 0.0–4.4)
Cholesterol, Total: 159 mg/dL (ref 100–199)
HDL: 31 mg/dL — AB (ref >39)
LDL Calculated: 89 mg/dL (ref 0–99)
TRIGLYCERIDES: 193 mg/dL — AB (ref 0–149)
VLDL Cholesterol Cal: 39 mg/dL (ref 5–40)

## 2014-08-31 LAB — MICROALBUMIN, URINE: Microalbumin, Urine: 7 ug/mL

## 2014-09-02 ENCOUNTER — Encounter: Payer: Self-pay | Admitting: Family Medicine

## 2014-11-07 ENCOUNTER — Other Ambulatory Visit: Payer: Self-pay | Admitting: Family Medicine

## 2014-11-07 DIAGNOSIS — Z1231 Encounter for screening mammogram for malignant neoplasm of breast: Secondary | ICD-10-CM

## 2014-11-14 ENCOUNTER — Encounter: Payer: Self-pay | Admitting: Family Medicine

## 2014-11-19 ENCOUNTER — Ambulatory Visit (HOSPITAL_COMMUNITY)
Admission: RE | Admit: 2014-11-19 | Discharge: 2014-11-19 | Disposition: A | Discharge status: Home / Self Care | Payer: Medicare Other | Source: Ambulatory Visit | Attending: Family Medicine | Admitting: Family Medicine

## 2014-11-19 DIAGNOSIS — Z1231 Encounter for screening mammogram for malignant neoplasm of breast: Secondary | ICD-10-CM | POA: Diagnosis not present

## 2014-11-21 ENCOUNTER — Telehealth: Payer: Self-pay | Admitting: Family Medicine

## 2014-11-21 MED ORDER — HYDROCODONE-ACETAMINOPHEN 10-325 MG PO TABS: 1.0000 | ORAL_TABLET | ORAL | Status: DC | PRN

## 2014-11-21 NOTE — Telephone Encounter (Signed)
[place] to see 

## 2014-11-21 NOTE — Telephone Encounter (Signed)
Pt is needing a refill on her hydrocodone 5-325 mg tab

## 2014-11-21 NOTE — Telephone Encounter (Signed)
She may have a prescription for 30 tablets but she will need a follow-up office visit within she thinks she will need a another prescription of hydrocodone.

## 2014-11-21 NOTE — Telephone Encounter (Signed)
Last seen 08/28/14.

## 2014-11-22 NOTE — Telephone Encounter (Signed)
Left message on voicemail notifying patient that script is ready for pickup and office visit for further refills.

## 2014-11-26 ENCOUNTER — Ambulatory Visit (INDEPENDENT_AMBULATORY_CARE_PROVIDER_SITE_OTHER): Payer: Medicare Other | Admitting: Nurse Practitioner

## 2014-11-26 ENCOUNTER — Encounter: Payer: Self-pay | Admitting: Nurse Practitioner

## 2014-11-26 VITALS — BP 124/84 | Wt 218.0 lb

## 2014-11-26 DIAGNOSIS — R911 Solitary pulmonary nodule: Secondary | ICD-10-CM | POA: Diagnosis not present

## 2014-11-26 DIAGNOSIS — I868 Varicose veins of other specified sites: Secondary | ICD-10-CM | POA: Diagnosis not present

## 2014-11-26 DIAGNOSIS — J300 Vasomotor rhinitis: Secondary | ICD-10-CM | POA: Diagnosis not present

## 2014-11-26 DIAGNOSIS — G4700 Insomnia, unspecified: Secondary | ICD-10-CM

## 2014-11-26 DIAGNOSIS — I10 Essential (primary) hypertension: Secondary | ICD-10-CM

## 2014-11-26 DIAGNOSIS — G43019 Migraine without aura, intractable, without status migrainosus: Secondary | ICD-10-CM

## 2014-11-26 DIAGNOSIS — I8390 Asymptomatic varicose veins of unspecified lower extremity: Secondary | ICD-10-CM

## 2014-11-26 MED ORDER — TRAZODONE HCL 100 MG PO TABS: 100.0000 mg | ORAL_TABLET | Freq: Every day | ORAL | Status: DC

## 2014-11-26 MED ORDER — METHYLPREDNISOLONE ACETATE 40 MG/ML IJ SUSP
40.0000 mg | Freq: Once | INTRAMUSCULAR | Status: AC
  Administered 2014-11-26: 40 mg via INTRAMUSCULAR

## 2014-11-26 NOTE — Progress Notes (Signed)
Subjective:  Presents for recheck. Has had sharp, stabbing migraine headache daily x 1 month. Generalized. Facial pressure worse with bending. Slight sore throat. "wet" pressure in the ears. Rare cough. Wears CPAP every night. Trazodone helping some with sleep. No CP/ischemic type pain or SOB. No wheezing. Denies daily analgesics. No excessive caffeine intake. Also relates migraines to weather change.   Objective:   BP 124/84 mmHg  Wt 218 lb (98.884 kg) NAD. Alert, oriented. TMs retracted bilat; no erythema. Pharynx minimally injected with cloudy PND noted. Neck supple with mild anterior adenopathy. Lungs clear. Heart RRR. Lower extremities no edema. Moderate varicose veins noted with slight dilation in some areas.   Assessment:  Problem List Items Addressed This Visit      Cardiovascular and Mediastinum   Essential hypertension   Migraine - Primary   Relevant Medications   traZODone (DESYREL) 100 MG tablet   diclofenac (VOLTAREN) 75 MG EC tablet   escitalopram (LEXAPRO) 20 MG tablet   Varicose veins     Other   Insomnia   RESOLVED: Pulmonary nodule    Other Visit Diagnoses    Vasomotor rhinitis        Relevant Medications    methylPREDNISolone acetate (DEPO-MEDROL) injection 40 mg (Completed)       Plan:  Meds ordered this encounter  Medications  . traZODone (DESYREL) 100 MG tablet    Sig: Take 1 tablet (100 mg total) by mouth at bedtime.    Dispense:  30 tablet    Refill:  5    Order Specific Question:  Supervising Provider    Answer:  Merlyn AlbertLUKING, WILLIAM S [[id number]]  . methylPREDNISolone acetate (DEPO-MEDROL) injection 40 mg    Sig:   . diclofenac (VOLTAREN) 75 MG EC tablet    Sig:   . escitalopram (LEXAPRO) 20 MG tablet    Sig:     Given Rx for knee high compression stockings. Work on weight loss and increasing activity. Return at end of month for [place] for flu vaccine. Increase Trazodone dose. Continue daily Flonase and Loratadine. Will see if improving sleep and sinus  pressure will help headaches. Call back by end of week if no improvement.  Return in about 3 months (around 02/26/2015) for recheck.

## 2014-12-19 ENCOUNTER — Ambulatory Visit (INDEPENDENT_AMBULATORY_CARE_PROVIDER_SITE_OTHER): Payer: Medicare Other | Admitting: *Deleted

## 2014-12-19 DIAGNOSIS — Z23 Encounter for immunization: Secondary | ICD-10-CM

## 2015-01-22 ENCOUNTER — Other Ambulatory Visit: Payer: Self-pay | Admitting: Family Medicine

## 2015-01-25 ENCOUNTER — Other Ambulatory Visit: Payer: Self-pay | Admitting: Family Medicine

## 2015-02-01 ENCOUNTER — Telehealth: Payer: Self-pay | Admitting: Family Medicine

## 2015-02-01 MED ORDER — HYDROCODONE-ACETAMINOPHEN 10-325 MG PO TABS: 1.0000 | ORAL_TABLET | ORAL | Status: DC | PRN

## 2015-02-01 NOTE — Telephone Encounter (Signed)
Seen oct ok times one

## 2015-02-01 NOTE — Telephone Encounter (Signed)
Pt is requesting a refill on her HYDROcodone-acetaminophen (NORCO) 10-325 MG tablet. °

## 2015-02-26 ENCOUNTER — Ambulatory Visit (INDEPENDENT_AMBULATORY_CARE_PROVIDER_SITE_OTHER): Payer: Medicare Other | Admitting: Family Medicine

## 2015-02-26 ENCOUNTER — Encounter: Payer: Self-pay | Admitting: Family Medicine

## 2015-02-26 VITALS — BP 134/84 | Ht 65.0 in | Wt 224.1 lb

## 2015-02-26 DIAGNOSIS — B9689 Other specified bacterial agents as the cause of diseases classified elsewhere: Secondary | ICD-10-CM

## 2015-02-26 DIAGNOSIS — D509 Iron deficiency anemia, unspecified: Secondary | ICD-10-CM | POA: Diagnosis not present

## 2015-02-26 DIAGNOSIS — J019 Acute sinusitis, unspecified: Secondary | ICD-10-CM

## 2015-02-26 DIAGNOSIS — E785 Hyperlipidemia, unspecified: Secondary | ICD-10-CM | POA: Diagnosis not present

## 2015-02-26 DIAGNOSIS — E119 Type 2 diabetes mellitus without complications: Secondary | ICD-10-CM | POA: Diagnosis not present

## 2015-02-26 DIAGNOSIS — I10 Essential (primary) hypertension: Secondary | ICD-10-CM | POA: Diagnosis not present

## 2015-02-26 DIAGNOSIS — K5909 Other constipation: Secondary | ICD-10-CM

## 2015-02-26 LAB — POCT GLYCOSYLATED HEMOGLOBIN (HGB A1C): HEMOGLOBIN A1C: 5.6

## 2015-02-26 MED ORDER — LUBIPROSTONE 24 MCG PO CAPS: 24.0000 ug | ORAL_CAPSULE | Freq: Two times a day (BID) | ORAL | Status: DC

## 2015-02-26 MED ORDER — LINACLOTIDE 145 MCG PO CAPS: 145.0000 ug | ORAL_CAPSULE | Freq: Every day | ORAL | Status: DC

## 2015-02-26 MED ORDER — AMOXICILLIN 500 MG PO TABS
500.0000 mg | ORAL_TABLET | Freq: Three times a day (TID) | ORAL | Status: DC
Start: 2015-02-26 — End: 2015-03-14

## 2015-02-26 NOTE — Progress Notes (Signed)
   Subjective:    Patient ID: [name], female    DOB: 07-Nov-1965, 50 y.o.   MRN: [id number]  Diabetes She presents for her follow-up diabetic visit. She has type 2 diabetes mellitus. No MedicAlert identification noted. Pertinent negatives for hypoglycemia include no confusion. Pertinent negatives for diabetes include no chest pain, no fatigue, no polydipsia, no polyphagia and no weakness. She has not had a previous visit with a dietitian. She does not see a podiatrist.Eye exam is current (07/29/2014).  Constipation This is a new problem. The current episode started more than 1 year ago. The problem has been gradually worsening since onset. Her stool frequency is 1 time per week or less. The stool is described as firm. The patient is on a high fiber diet. She exercises regularly. There has been adequate water intake. Pertinent negatives include no abdominal pain, fever, melena or nausea. She has tried diet changes and fiber for the symptoms. The treatment provided no relief.   Patient states no concerns this visit.  constipation is been going on for a while worse over the past few weeks up-to-date on colonoscopy used to be on medication no longer on a medication.  Patient has been try to watch her diet to some degree take her medicines on a regular basis She does have hypertension takes her medicine avoids excessive salt use does a little bit of walking but not much Patient also has arthralgias she takes anti-inflammatory for this seems to help Depression seems to be stable. Tolerating her medications. History hyperlipidemia does need lab work.  Results for orders placed or performed in visit on 02/26/15  POCT HgB A1C  Result Value Ref Range   Hemoglobin A1C 5.6     Review of Systems  Constitutional: Negative for fever, activity change, appetite change and fatigue.  HENT: Negative for congestion.   Respiratory: Negative for cough.   Cardiovascular: Negative for chest pain.    Gastrointestinal: Positive for constipation. Negative for nausea, abdominal pain and melena.  Endocrine: Negative for polydipsia and polyphagia.  Neurological: Negative for weakness.  Psychiatric/Behavioral: Negative for confusion.       Objective:   Physical Exam  Constitutional: She appears well-nourished. No distress.  Cardiovascular: Normal rate, regular rhythm and normal heart sounds.   No murmur heard. Pulmonary/Chest: Effort normal and breath sounds normal. No respiratory distress.  Musculoskeletal: She exhibits no edema.  Lymphadenopathy:    She has no cervical adenopathy.  Neurological: She is alert. She exhibits normal muscle tone.  Psychiatric: Her behavior is normal.  Vitals reviewed.   25 minutes was spent with the patient. Greater than half the time was spent in discussion and answering questions and counseling regarding the issues that the patient came in for today.       Assessment & Plan:  Diabetes-continue current measures. Check A1c await the results of this. Hypertension decent control continue current measures. Watch diet closely Depression stable with Lexapro continue present measures. Sinusitis amoxicillin prescribed follow-up if ongoing troubles Constipation-up-to-date on colonoscopy. Prescribed linzess hopefully this will help Hyperlipidemia check lipid liver profile.[id number]

## 2015-02-27 ENCOUNTER — Telehealth: Payer: Self-pay | Admitting: Family Medicine

## 2015-02-27 DIAGNOSIS — K5900 Constipation, unspecified: Secondary | ICD-10-CM

## 2015-02-27 NOTE — Telephone Encounter (Signed)
Please inform the patient that she needs to go ahead with colonoscopy. She has used [facility] recently for constipation issues. This patient needs colonoscopy because of age and constipation. Please initiate referral

## 2015-02-27 NOTE — Telephone Encounter (Signed)
No record of prior colonoscopy- just flex sign by Dr Karilyn Cota 06/24/10

## 2015-02-27 NOTE — Telephone Encounter (Signed)
Nurse's-I saw patient this week. Having problems with constipation. She's 50 years old. please talk with [facility]. I have looked through the electronic record and through her paper chart. This patient has seen Dr.Rourke and Rehman years ago. She states that [facility] did a colonoscopy. I find no record of this. I see that she had a flexible sigmoidoscopy years ago. Asked them to check their records. If they have no record of this patient having a colonoscopy please let me know. This patient is having constipation and I recommend referral and colonoscopy but please find this information out firsty

## 2015-02-28 ENCOUNTER — Other Ambulatory Visit: Payer: Self-pay | Admitting: Family Medicine

## 2015-02-28 ENCOUNTER — Telehealth: Payer: Self-pay | Admitting: Family Medicine

## 2015-02-28 ENCOUNTER — Encounter: Payer: Self-pay | Admitting: Family Medicine

## 2015-02-28 DIAGNOSIS — K5909 Other constipation: Secondary | ICD-10-CM | POA: Diagnosis not present

## 2015-02-28 DIAGNOSIS — E785 Hyperlipidemia, unspecified: Secondary | ICD-10-CM | POA: Diagnosis not present

## 2015-02-28 DIAGNOSIS — J019 Acute sinusitis, unspecified: Secondary | ICD-10-CM | POA: Diagnosis not present

## 2015-02-28 DIAGNOSIS — E119 Type 2 diabetes mellitus without complications: Secondary | ICD-10-CM | POA: Diagnosis not present

## 2015-02-28 DIAGNOSIS — I10 Essential (primary) hypertension: Secondary | ICD-10-CM | POA: Diagnosis not present

## 2015-02-28 NOTE — Addendum Note (Signed)
Addended by: Margaretha Sheffield on: 02/28/2015 08:32 AM   Modules accepted: Orders

## 2015-02-28 NOTE — Telephone Encounter (Signed)
Patient notified that GI office will be contacting her to schedule colonoscopy. Patient verbalized understanding.

## 2015-02-28 NOTE — Telephone Encounter (Signed)
Referral ordered in EPIC. 

## 2015-02-28 NOTE — Telephone Encounter (Signed)
Pt came by to let Dr. Lorin Picket know that she got the prescription of linzess filled.

## 2015-02-28 NOTE — Telephone Encounter (Signed)
Left message to return call 

## 2015-02-28 NOTE — Telephone Encounter (Signed)
Ok, I canceled the Amitiza from her list

## 2015-03-01 ENCOUNTER — Encounter: Payer: Self-pay | Admitting: Family Medicine

## 2015-03-01 ENCOUNTER — Encounter: Payer: Self-pay | Admitting: Internal Medicine

## 2015-03-01 LAB — BASIC METABOLIC PANEL
BUN / CREAT RATIO: 14 (ref 9–23)
BUN: 13 mg/dL (ref 6–24)
CALCIUM: 9.7 mg/dL (ref 8.7–10.2)
CHLORIDE: 98 mmol/L (ref 96–106)
CO2: 25 mmol/L (ref 18–29)
Creatinine, Ser: 0.93 mg/dL (ref 0.57–1.00)
GFR, EST AFRICAN AMERICAN: 82 mL/min/{1.73_m2} (ref >59)
GFR, EST NON AFRICAN AMERICAN: 71 mL/min/{1.73_m2} (ref >59)
Glucose: 112 mg/dL — ABNORMAL HIGH (ref 65–99)
POTASSIUM: 4.2 mmol/L (ref 3.5–5.2)
SODIUM: 137 mmol/L (ref 134–144)

## 2015-03-01 LAB — CBC WITH DIFFERENTIAL/PLATELET
BASOS: 1 %
Basophils Absolute: 0 10*3/uL (ref 0.0–0.2)
EOS (ABSOLUTE): 0.3 10*3/uL (ref 0.0–0.4)
Eos: 5 %
Hematocrit: 36.1 % (ref 34.0–46.6)
Hemoglobin: 12.4 g/dL (ref 11.1–15.9)
IMMATURE GRANS (ABS): 0 10*3/uL (ref 0.0–0.1)
Immature Granulocytes: 0 %
LYMPHS ABS: 1.9 10*3/uL (ref 0.7–3.1)
LYMPHS: 35 %
MCH: 29.8 pg (ref 26.6–33.0)
MCHC: 34.3 g/dL (ref 31.5–35.7)
MCV: 87 fL (ref 79–97)
MONOS ABS: 0.4 10*3/uL (ref 0.1–0.9)
Monocytes: 8 %
NEUTROS ABS: 2.7 10*3/uL (ref 1.4–7.0)
NEUTROS PCT: 51 %
PLATELETS: 301 10*3/uL (ref 150–379)
RBC: 4.16 x10E6/uL (ref 3.77–5.28)
RDW: 13.8 % (ref 12.3–15.4)
WBC: 5.3 10*3/uL (ref 3.4–10.8)

## 2015-03-01 LAB — LIPID PANEL
Chol/HDL Ratio: 4.3 ratio units (ref 0.0–4.4)
Cholesterol, Total: 158 mg/dL (ref 100–199)
HDL: 37 mg/dL — ABNORMAL LOW (ref >39)
LDL Calculated: 96 mg/dL (ref 0–99)
Triglycerides: 125 mg/dL (ref 0–149)
VLDL CHOLESTEROL CAL: 25 mg/dL (ref 5–40)

## 2015-03-01 LAB — HEPATIC FUNCTION PANEL
ALT: 24 IU/L (ref 0–32)
AST: 24 IU/L (ref 0–40)
Albumin: 4.3 g/dL (ref 3.5–5.5)
Alkaline Phosphatase: 62 IU/L (ref 39–117)
Bilirubin Total: 0.3 mg/dL (ref 0.0–1.2)
Bilirubin, Direct: 0.09 mg/dL (ref 0.00–0.40)
Total Protein: 7.3 g/dL (ref 6.0–8.5)

## 2015-03-14 ENCOUNTER — Encounter: Payer: Self-pay | Admitting: Gastroenterology

## 2015-03-14 ENCOUNTER — Ambulatory Visit (INDEPENDENT_AMBULATORY_CARE_PROVIDER_SITE_OTHER): Payer: Medicare Other | Admitting: Gastroenterology

## 2015-03-14 ENCOUNTER — Other Ambulatory Visit: Payer: Self-pay

## 2015-03-14 VITALS — BP 126/74 | HR 67 | Temp 97.1°F | Ht 66.0 in | Wt 219.8 lb

## 2015-03-14 DIAGNOSIS — Z1211 Encounter for screening for malignant neoplasm of colon: Secondary | ICD-10-CM | POA: Insufficient documentation

## 2015-03-14 DIAGNOSIS — K5909 Other constipation: Secondary | ICD-10-CM

## 2015-03-14 MED ORDER — PEG 3350-KCL-NA BICARB-NACL 420 G PO SOLR: 4000.0000 mL | Freq: Once | ORAL | Status: DC

## 2015-03-14 NOTE — Assessment & Plan Note (Signed)
Continue Linzess 145 mcg daily. With looser stool, discussed possibly diving capsule in half as a novel approach. Patient seems to like this medication, so we will keep this going for her.

## 2015-03-14 NOTE — Assessment & Plan Note (Signed)
50 year old female with need for initial screening colonoscopy. Chronic symptoms of constipation improved with Linzess. Low-volume hematochezia chronic and likely benign anorectal source. Otherwise, no concerning signs, symptoms, or FH of colon cancer.  Proceed with TCS with Dr. Jena Gauss in near future: the risks, benefits, and alternatives have been discussed with the patient in detail. The patient states understanding and desires to proceed. Propofol due to multiple medications

## 2015-03-14 NOTE — Progress Notes (Signed)
Primary Care Physician:  Lilyan Punt, MD Primary Gastroenterologist:  Dr. Jena Gauss   Chief Complaint  Patient presents with  . Constipation    HPI:   [name] is a 50 y.o. female presenting today at the request of her PCP due to constipation. She has never had a complete lower GI evaluation.   Taking Linzess 145 mcg with loose BMs twice a day. Taking daily. Was noting low-volume hematochezia in setting of constipation. Occasional LLQ discomfort but has improved after starting Linzess. Previously would hurt on left side. Some nausea prior to starting Linzess but now resolved. No vomiting, just heaving, in setting of constipation but now resolved.   Past Medical History  Diagnosis Date  . Hypertension   . Diabetes mellitus without complication (HCC)   . Migraine headache   . Allergy   . Sleep apnea   . Pulmonary nodule   . Hidradenitis suppurativa   . Folliculitis 07/20/2012    Past Surgical History  Procedure Laterality Date  . Abdominal hysterectomy    . Knee surgery Right 2012  . Endometrial ablation    . Pilonidal cyst excision  June 2011  . Foreign body removal N/A 05/26/2013    Procedure: FOREIGN BODY REMOVAL ADULT ABDOMINAL WALL;  Surgeon: Dalia Heading, MD;  Location: [facility];  Service: General;  Laterality: N/A;    Current Outpatient Prescriptions  Medication Sig Dispense Refill  . aspirin 81 MG tablet Take 81 mg by mouth daily.    . diclofenac (VOLTAREN) 75 MG EC tablet     . diphenhydrAMINE (BENADRYL) 25 MG tablet Take 25 mg by mouth 2 (two) times daily as needed for allergies.    Marland Kitchen escitalopram (LEXAPRO) 20 MG tablet     . fluticasone (FLONASE) 50 MCG/ACT nasal spray Place 2 sprays into both nostrils daily as needed for allergies or rhinitis. 16 g 1  . HYDROcodone-acetaminophen (NORCO) 10-325 MG tablet Take 1 tablet by mouth every 4 (four) hours as needed. 40 tablet 0  . ketoconazole (NIZORAL) 2 % cream Apply 1 application topically 2 (two) times  daily. 30 g 4  . Linaclotide (LINZESS) 145 MCG CAPS capsule Take 1 capsule (145 mcg total) by mouth daily. 30 capsule 3  . loratadine (CLARITIN) 10 MG tablet Take 1 tablet (10 mg total) by mouth daily as needed for allergies. 90 tablet 1  . metoprolol (LOPRESSOR) 100 MG tablet Take 1 tablet (100 mg total) by mouth 2 (two) times daily. 180 tablet 1  . nystatin-triamcinolone ointment (MYCOLOG) Apply topically 2 (two) times daily. To affected area. 30 g prn  . promethazine (PHENERGAN) 25 MG tablet Take 1 tablet (25 mg total) by mouth every 6 (six) hours as needed for nausea. 30 tablet 2  . traZODone (DESYREL) 100 MG tablet Take 1 tablet (100 mg total) by mouth at bedtime. 30 tablet 5  . triamcinolone cream (KENALOG) 0.1 % APPLY ONE APPLICATION TOPICALLY TWICE DAILY AS NEEDED FOR RASH; USE UP TO 2 WEEKS 30 g 5  . triamterene-hydrochlorothiazide (MAXZIDE-25) 37.5-25 MG per tablet Take 1 tablet by mouth daily. 90 tablet 1  . ULTICARE PEN NEEDLES 29G X MISC USE DAILY AS DIRECTED FOR VICTOZA PEN 100 each 0  . VICTOZA 18 MG/3ML SOPN INJECT 1.8 MG SUBCUTANEOUSLY ONCE DAILY 9 pen 1   No current facility-administered medications for this visit.    Allergies as of 03/14/2015 - Review Complete 03/14/2015  Allergen Reaction Noted  . Bactrim [sulfamethoxazole-trimethoprim] Rash 03/16/2013  .  Lisinopril  08/28/2014  . Other  08/03/2012    Family History  Problem Relation Age of Onset  . Stroke Mother   . Hypertension Mother   . Thyroid disease Father   . Hypertension Sister   . Hypertension Brother   . Cancer Paternal Grandfather     prostate  . Colon cancer Maternal Grandfather     Social History   Social History  . Marital Status: Single    Spouse Name: N/A  . Number of Children: N/A  . Years of Education: N/A   Occupational History  . unemployed    Social History Main Topics  . Smoking status: Never Smoker   . Smokeless tobacco: Never Used  . Alcohol Use: No  . Drug Use: No    . Sexual Activity: Not Currently    Birth Control/ Protection: Surgical   Other Topics Concern  . Not on file   Social History Narrative    Review of Systems: Gen: see HPI  CV: Denies chest pain, heart palpitations, peripheral edema, syncope.  Resp: occasional DOE GI: see HPI  GU : nocturnal urination  MS: +joint pain  Derm: Denies rash, itching, dry skin Psych: Denies depression, anxiety, memory loss, and confusion Heme: see HPI   Physical Exam: BP 126/74 mmHg  Pulse 67  Temp(Src) 97.1 F (36.2 C)  Ht  (1.676 m)  Wt 219 lb 12.8 oz (99.701 kg)  BMI 35.49 kg/m2 General:   Alert and oriented. Pleasant and cooperative. Well-nourished and well-developed.  Head:  Normocephalic and atraumatic. Eyes:  Without icterus, sclera clear and conjunctiva pink.  Ears:  Normal auditory acuity. Nose:  No deformity, discharge,  or lesions. Mouth:  No deformity or lesions, oral mucosa pink.  Lungs:  Clear to auscultation bilaterally. No wheezes, rales, or rhonchi. No distress.  Heart:  S1, S2 present without murmurs appreciated.  Abdomen:  +BS, soft, non-tender and non-distended. No HSM noted. No guarding or rebound. No masses appreciated.  Rectal:  Deferred  Msk:  Symmetrical without gross deformities. Normal posture. Extremities:  Without edema. Neurologic:  Alert and  oriented x4;  grossly normal neurologically. Psych:  Alert and cooperative. Normal mood and affect.

## 2015-03-14 NOTE — Patient Instructions (Signed)
Continue taking Linzess once daily. You could even half the capsule and mix in applesauce daily.   We have scheduled you for a colonoscopy with Dr. Jena Gauss in the near future.

## 2015-03-15 NOTE — Progress Notes (Signed)
CC'ED TO PCP 

## 2015-03-19 ENCOUNTER — Telehealth: Payer: Self-pay | Admitting: *Deleted

## 2015-03-19 NOTE — Telephone Encounter (Signed)
See form in your basket. Pt has filled a prescription for victoza. A list of alternatives was sent for the 2017 drug list

## 2015-03-20 NOTE — Telephone Encounter (Signed)
Looking at this form it is hard to know if this is a mandated has to switch or the patient could save money by switching. I would recommend speaking with the patient if they 1 to stick with her current medication she can but if the insurance company in sup denying add we would have to switch to the alternative.(The alternative medicine Bydureon is a once week injection similar to Victoza it works in the same way it might be cheaper) certainly the patient can stick with what she is using and if down the road [employer] denies it then we would have to make the switch

## 2015-03-21 NOTE — Telephone Encounter (Signed)
Form at nurses station

## 2015-03-21 NOTE — Telephone Encounter (Signed)
[facility]

## 2015-03-27 MED ORDER — EXENATIDE ER 2 MG ~~LOC~~ PEN: 2.0000 mg | PEN_INJECTOR | SUBCUTANEOUS | Status: DC

## 2015-03-27 NOTE — Telephone Encounter (Signed)
Bydureon , 2 mg subcutaneous injection once a week, 30 day supply, 6 refills

## 2015-03-27 NOTE — Telephone Encounter (Signed)
Discussed with patient. Patient stated that her insurance company also sent her a letter stating that they will no longer pay for the victoza and the alternative was Bydureon. Patient would like the rx for Bydureon sent to [facility] in [place].

## 2015-03-27 NOTE — Telephone Encounter (Signed)
Rx sent electronically to pharmacy. Patient notified. 

## 2015-03-29 ENCOUNTER — Telehealth: Payer: Self-pay | Admitting: Family Medicine

## 2015-03-29 MED ORDER — LINACLOTIDE 145 MCG PO CAPS: 145.0000 ug | ORAL_CAPSULE | Freq: Every day | ORAL | Status: DC

## 2015-03-29 MED ORDER — TRAZODONE HCL 100 MG PO TABS: 100.0000 mg | ORAL_TABLET | Freq: Every day | ORAL | Status: DC

## 2015-03-29 NOTE — Telephone Encounter (Signed)
Pt would like trazodone and Linzess called into [facility] for a 90 day supply   Pt is currently out of these meds

## 2015-03-29 NOTE — Telephone Encounter (Signed)
Rx's sent to pharmacy. Patient notified. 

## 2015-03-29 NOTE — Telephone Encounter (Signed)
Send in 90 day with 1 rf each

## 2015-03-29 NOTE — Patient Instructions (Signed)
[name]  03/29/2015     @   Your procedure is scheduled on 04/08/2015  Report to [facility] at  745  A.M.  Call this number if you have problems the morning of surgery:  [phone]   Remember:  Do not eat food or drink liquids after midnight.  Take these medicines the morning of surgery with A SIP OF WATER  Benadryl, lexapro, hydrocodone, clairtin.   Do not wear jewelry, make-up or nail polish.  Do not wear lotions, powders, or perfumes.  You may wear deodorant.  Do not shave 48 hours prior to surgery.  Men may shave face and neck.  Do not bring valuables to the hospital.  [facility] is not responsible for any belongings or valuables.  Contacts, dentures or bridgework may not be worn into surgery.  Leave your suitcase in the car.  After surgery it may be brought to your room.  For patients admitted to the hospital, discharge time will be determined by your treatment team.  Patients discharged the day of surgery will not be allowed to drive home.   Name and phone number of your driver:   family Special instructions:  Follow the diet and prep instructions given to you by Dr Luvenia Starch office. Please read over the following fact sheets that you were given. Coughing and Deep Breathing, Surgical Site Infection Prevention, Anesthesia Post-op Instructions and Care and Recovery After Surgery      Colonoscopy A colonoscopy is an exam to look at the entire large intestine (colon). This exam can help find problems such as tumors, polyps, inflammation, and areas of bleeding. The exam takes about 1 hour.  LET [facility] CARE PROVIDER KNOW ABOUT:   Any allergies you have.  All medicines you are taking, including vitamins, herbs, eye drops, creams, and over-the-counter medicines.  Previous problems you or members of your family have had with the use of anesthetics.  Any blood disorders you have.  Previous surgeries you have had.  Medical  conditions you have. RISKS AND COMPLICATIONS  Generally, this is a safe procedure. However, as with any procedure, complications can occur. Possible complications include:  Bleeding.  Tearing or rupture of the colon wall.  Reaction to medicines given during the exam.  Infection (rare). BEFORE THE PROCEDURE   Ask your health care provider about changing or stopping your regular medicines.  You may be prescribed an oral bowel prep. This involves drinking a large amount of medicated liquid, starting the day before your procedure. The liquid will cause you to have multiple loose stools until your stool is almost clear or light green. This cleans out your colon in preparation for the procedure.  Do not eat or drink anything else once you have started the bowel prep, unless your health care provider tells you it is safe to do so.  Arrange for someone to drive you home after the procedure. PROCEDURE   You will be given medicine to help you relax (sedative).  You will lie on your side with your knees bent.  A long, flexible tube with a light and camera on the end (colonoscope) will be inserted through the rectum and into the colon. The camera sends video back to a computer screen as it moves through the colon. The colonoscope also releases carbon dioxide gas to inflate the colon. This helps your health care provider see the area better.  During the exam, your health care provider may  take a small tissue sample (biopsy) to be examined under a microscope if any abnormalities are found.  The exam is finished when the entire colon has been viewed. AFTER THE PROCEDURE   Do not drive for 24 hours after the exam.  You may have a small amount of blood in your stool.  You may pass moderate amounts of gas and have mild abdominal cramping or bloating. This is caused by the gas used to inflate your colon during the exam.  Ask when your test results will be ready and how you will get your results.  Make sure you get your test results.   This information is not intended to replace advice given to you by your health care provider. Make sure you discuss any questions you have with your health care provider.   Document Released: 01/24/2000 Document Revised: 11/16/2012 Document Reviewed: 10/03/2012 Elsevier Interactive Patient Education 2016 Elsevier Inc. Colonoscopy, Care After Refer to this sheet in the next few weeks. These instructions provide you with information on caring for yourself after your procedure. Your health care provider may also give you more specific instructions. Your treatment has been planned according to current medical practices, but problems sometimes occur. Call your health care provider if you have any problems or questions after your procedure. WHAT TO EXPECT AFTER THE PROCEDURE  After your procedure, it is typical to have the following:  A small amount of blood in your stool.  Moderate amounts of gas and mild abdominal cramping or bloating. HOME CARE INSTRUCTIONS  Do not drive, operate machinery, or sign important documents for 24 hours.  You may shower and resume your regular physical activities, but move at a slower pace for the first 24 hours.  Take frequent rest periods for the first 24 hours.  Walk around or put a warm pack on your abdomen to help reduce abdominal cramping and bloating.  Drink enough fluids to keep your urine clear or pale yellow.  You may resume your normal diet as instructed by your health care provider. Avoid heavy or fried foods that are hard to digest.  Avoid drinking alcohol for 24 hours or as instructed by your health care provider.  Only take over-the-counter or prescription medicines as directed by your health care provider.  If a tissue sample (biopsy) was taken during your procedure:  Do not take aspirin or blood thinners for 7 days, or as instructed by your health care provider.  Do not drink alcohol for 7 days, or  as instructed by your health care provider.  Eat soft foods for the first 24 hours. SEEK MEDICAL CARE IF: You have persistent spotting of blood in your stool 2-3 days after the procedure. SEEK IMMEDIATE MEDICAL CARE IF:  You have more than a small spotting of blood in your stool.  You pass large blood clots in your stool.  Your abdomen is swollen (distended).  You have nausea or vomiting.  You have a fever.  You have increasing abdominal pain that is not relieved with medicine.   This information is not intended to replace advice given to you by your health care provider. Make sure you discuss any questions you have with your health care provider.   Document Released: 09/10/2003 Document Revised: 11/16/2012 Document Reviewed: 10/03/2012 Elsevier Interactive Patient Education 2016 Elsevier Inc. PATIENT INSTRUCTIONS POST-ANESTHESIA  IMMEDIATELY FOLLOWING SURGERY:  Do not drive or operate machinery for the first twenty four hours after surgery.  Do not make any important decisions for twenty four  hours after surgery or while taking narcotic pain medications or sedatives.  If you develop intractable nausea and vomiting or a severe headache please notify your doctor immediately.  FOLLOW-UP:  Please make an appointment with your surgeon as instructed. You do not need to follow up with anesthesia unless specifically instructed to do so.  WOUND CARE INSTRUCTIONS (if applicable):  Keep a dry clean dressing on the anesthesia/puncture wound site if there is drainage.  Once the wound has quit draining you may leave it open to air.  Generally you should leave the bandage intact for twenty four hours unless there is drainage.  If the epidural site drains for more than 36-48 hours please call the anesthesia department.  QUESTIONS?:  Please feel free to call your physician or the hospital operator if you have any questions, and they will be happy to assist you.

## 2015-04-01 ENCOUNTER — Other Ambulatory Visit: Payer: Self-pay | Admitting: Family Medicine

## 2015-04-02 ENCOUNTER — Encounter (HOSPITAL_COMMUNITY): Payer: Self-pay

## 2015-04-02 ENCOUNTER — Encounter (HOSPITAL_COMMUNITY)
Admission: RE | Admit: 2015-04-02 | Discharge: 2015-04-02 | Disposition: A | Discharge status: Home / Self Care | Payer: Medicare Other | Source: Ambulatory Visit | Attending: Internal Medicine | Admitting: Internal Medicine

## 2015-04-02 ENCOUNTER — Other Ambulatory Visit: Payer: Self-pay

## 2015-04-02 DIAGNOSIS — K5909 Other constipation: Secondary | ICD-10-CM | POA: Insufficient documentation

## 2015-04-02 DIAGNOSIS — Z1211 Encounter for screening for malignant neoplasm of colon: Secondary | ICD-10-CM | POA: Insufficient documentation

## 2015-04-02 DIAGNOSIS — Z01810 Encounter for preprocedural cardiovascular examination: Secondary | ICD-10-CM | POA: Insufficient documentation

## 2015-04-02 HISTORY — DX: Anxiety disorder, unspecified: F41.9

## 2015-04-02 HISTORY — DX: Unspecified osteoarthritis, unspecified site: M19.90

## 2015-04-02 MED ORDER — PEG 3350-KCL-NA BICARB-NACL 420 G PO SOLR: 4000.0000 mL | Freq: Once | ORAL | Status: DC

## 2015-04-07 NOTE — Anesthesia Preprocedure Evaluation (Addendum)
Anesthesia Evaluation  Patient identified by MRN, date of birth, ID band Patient awake    Reviewed: Allergy & Precautions, H&P , NPO status , Patient's Chart, lab work & pertinent test results, reviewed documented beta blocker date and time   Airway Mallampati: II  TM Distance: >3 FB     Dental no notable dental hx. (+) Edentulous Upper   Pulmonary sleep apnea ,           Cardiovascular hypertension, Pt. on medications and Pt. on home beta blockers Normal cardiovascular exam     Neuro/Psych  Headaches, PSYCHIATRIC DISORDERS Depression    GI/Hepatic negative GI ROS,   Endo/Other  diabetes, Type 2, Oral Hypoglycemic AgentsMorbid obesity  Renal/GU      Musculoskeletal   Abdominal   Peds  Hematology   Anesthesia Other Findings   Reproductive/Obstetrics                           Anesthesia Physical  Anesthesia Plan  ASA: III  Anesthesia Plan: MAC   Post-op Pain Management:    Induction:   Airway Management Planned: Simple Face Mask  Additional Equipment:   Intra-op Plan:   Post-operative Plan:   Informed Consent: I have reviewed the patients History and Physical, chart, labs and discussed the procedure including the risks, benefits and alternatives for the proposed anesthesia with the patient or authorized representative who has indicated his/her understanding and acceptance.   Dental advisory given  Plan Discussed with: CRNA and Anesthesiologist  Anesthesia Plan Comments:        Anesthesia Quick Evaluation

## 2015-04-08 ENCOUNTER — Ambulatory Visit (HOSPITAL_COMMUNITY): Payer: Medicare Other | Admitting: Anesthesiology

## 2015-04-08 ENCOUNTER — Ambulatory Visit (HOSPITAL_COMMUNITY)
Admission: RE | Admit: 2015-04-08 | Discharge: 2015-04-08 | Disposition: A | Discharge status: Home / Self Care | Payer: Medicare Other | Source: Ambulatory Visit | Attending: Internal Medicine | Admitting: Internal Medicine

## 2015-04-08 ENCOUNTER — Encounter (HOSPITAL_COMMUNITY)
Admission: RE | Disposition: A | Discharge status: Home / Self Care | Payer: Self-pay | Source: Ambulatory Visit | Attending: Internal Medicine

## 2015-04-08 ENCOUNTER — Encounter (HOSPITAL_COMMUNITY): Payer: Self-pay | Admitting: Anesthesiology

## 2015-04-08 DIAGNOSIS — Z6836 Body mass index (BMI) 36.0-36.9, adult: Secondary | ICD-10-CM | POA: Diagnosis not present

## 2015-04-08 DIAGNOSIS — Z1211 Encounter for screening for malignant neoplasm of colon: Secondary | ICD-10-CM | POA: Insufficient documentation

## 2015-04-08 DIAGNOSIS — Z791 Long term (current) use of non-steroidal anti-inflammatories (NSAID): Secondary | ICD-10-CM | POA: Diagnosis not present

## 2015-04-08 DIAGNOSIS — E669 Obesity, unspecified: Secondary | ICD-10-CM | POA: Insufficient documentation

## 2015-04-08 DIAGNOSIS — I10 Essential (primary) hypertension: Secondary | ICD-10-CM | POA: Diagnosis not present

## 2015-04-08 DIAGNOSIS — E118 Type 2 diabetes mellitus with unspecified complications: Secondary | ICD-10-CM | POA: Diagnosis not present

## 2015-04-08 DIAGNOSIS — Z79899 Other long term (current) drug therapy: Secondary | ICD-10-CM | POA: Insufficient documentation

## 2015-04-08 HISTORY — PX: COLONOSCOPY WITH PROPOFOL: SHX5780

## 2015-04-08 LAB — GLUCOSE, CAPILLARY: Glucose-Capillary: 102 mg/dL — ABNORMAL HIGH (ref 65–99)

## 2015-04-08 SURGERY — COLONOSCOPY WITH PROPOFOL
Anesthesia: Monitor Anesthesia Care

## 2015-04-08 MED ORDER — ONDANSETRON HCL 4 MG/2ML IJ SOLN
4.0000 mg | Freq: Once | INTRAMUSCULAR | Status: AC
  Administered 2015-04-08: 4 mg via INTRAVENOUS

## 2015-04-08 MED ORDER — LIDOCAINE HCL (PF) 1 % IJ SOLN
INTRAMUSCULAR | Status: AC
  Filled 2015-04-08: qty 5

## 2015-04-08 MED ORDER — PROPOFOL 10 MG/ML IV BOLUS
INTRAVENOUS | Status: AC
  Filled 2015-04-08: qty 40

## 2015-04-08 MED ORDER — PROPOFOL 500 MG/50ML IV EMUL
INTRAVENOUS | Status: DC | PRN
  Administered 2015-04-08: 150 ug/kg/min via INTRAVENOUS

## 2015-04-08 MED ORDER — LACTATED RINGERS IV SOLN
INTRAVENOUS | Status: DC
  Administered 2015-04-08: 08:00:00 via INTRAVENOUS

## 2015-04-08 MED ORDER — LACTATED RINGERS IV SOLN
INTRAVENOUS | Status: DC | PRN
  Administered 2015-04-08: 08:00:00 via INTRAVENOUS

## 2015-04-08 MED ORDER — FENTANYL CITRATE (PF) 100 MCG/2ML IJ SOLN
25.0000 ug | INTRAMUSCULAR | Status: AC
  Administered 2015-04-08 (×2): 25 ug via INTRAVENOUS

## 2015-04-08 MED ORDER — MIDAZOLAM HCL 2 MG/2ML IJ SOLN
INTRAMUSCULAR | Status: AC
  Filled 2015-04-08: qty 2

## 2015-04-08 MED ORDER — ONDANSETRON HCL 4 MG/2ML IJ SOLN
INTRAMUSCULAR | Status: AC
  Filled 2015-04-08: qty 2

## 2015-04-08 MED ORDER — FENTANYL CITRATE (PF) 100 MCG/2ML IJ SOLN
INTRAMUSCULAR | Status: AC
  Filled 2015-04-08: qty 2

## 2015-04-08 MED ORDER — MIDAZOLAM HCL 2 MG/2ML IJ SOLN
1.0000 mg | INTRAMUSCULAR | Status: DC | PRN
  Administered 2015-04-08: 2 mg via INTRAVENOUS
  Filled 2015-04-08: qty 2

## 2015-04-08 NOTE — Addendum Note (Signed)
Addendum  created 04/08/15 0930 by Heather Roberts, MD   Modules edited: Anesthesia Attestations

## 2015-04-08 NOTE — Transfer of Care (Signed)
Immediate Anesthesia Transfer of Care Note  Patient: [name]  Procedure(s) Performed: Procedure(s) with comments: COLONOSCOPY WITH PROPOFOL (N/A) - 0815 - moved to 9:00 - office to notify  Patient Location: PACU  Anesthesia Type:MAC  Level of Consciousness: awake  Airway & Oxygen Therapy: Patient Spontanous Breathing and Patient connected to face mask oxygen  Post-op Assessment: Report given to RN  Post vital signs: Reviewed  Last Vitals:  Filed Vitals:   04/08/15 0840 04/08/15 0850  BP: 112/62 108/60  Pulse:    Temp:    Resp: 20 15    Complications: No apparent anesthesia complications

## 2015-04-08 NOTE — Discharge Instructions (Signed)

## 2015-04-08 NOTE — Op Note (Signed)
[facility] [address] [place], [address]   COLONOSCOPY PROCEDURE REPORT  PATIENT: Heidi, Tyler  MR#: [id number] BIRTHDATE: 1966-02-03 , 49  yrs. old GENDER: female ENDOSCOPIST: R.  Roetta Sessions, MD FACP [facility] REFERRED UY:QIHKV Gerda Diss, M.D. PROCEDURE DATE:  April 29, 2015 PROCEDURE:   Ileo-colonoscopy, screening INDICATIONS:First-ever average risk colorectal cancer screening examination. MEDICATIONS: Deep sedation per Dr.  [facility] ASA CLASS:       Class II  CONSENT: The risks, benefits, alternatives and imponderables including but not limited to bleeding, perforation as well as the possibility of a missed lesion have been reviewed.  The potential for biopsy, lesion removal, etc. have also been discussed. Questions have been answered.  All parties agreeable.  Please see the history and physical in the medical record for more information.  DESCRIPTION OF PROCEDURE:   After the risks benefits and alternatives of the procedure were thoroughly explained, informed consent was obtained.  The digital rectal exam revealed no abnormalities of the rectum.   The EC-[id number] ([id number])  endoscope was introduced through the anus and advanced to the terminal ileum which was intubated for a short distance. No adverse events experienced.   The quality of the prep was adequate  The instrument was then slowly withdrawn as the colon was fully examined. Estimated blood loss is zero unless otherwise noted in this procedure report.      COLON FINDINGS: Small external hemorrhoidal tags present. Normal-appearing rectal mucosa.  Normal-appearing colonic mucosa. The distal 5 cm of terminal ileal mucosa also appeared normal. Retroflexion was performed. .  Withdrawal time=8 minutes 0 seconds.  The scope was withdrawn and the procedure completed. COMPLICATIONS: There were no immediate complications.  ENDOSCOPIC IMPRESSION: Normal  ileo-colonoscopy  RECOMMENDATIONS: repeat screening examination in 10 years.  eSigned:  R. Roetta Sessions, MD [facility] 04-29-2015 9:22 AM   cc:  CPT CODES: ICD CODES:  The ICD and CPT codes recommended by this software are interpretations from the data that the clinical staff has captured with the software.  The verification of the translation of this report to the ICD and CPT codes and modifiers is the sole responsibility of the health care institution and practicing physician where this report was generated.  [employer] will not be held responsible for the validity of the ICD and CPT codes included on this report.  AMA assumes no liability for data contained or not contained herein. CPT is a Publishing rights manager of the [employer].

## 2015-04-08 NOTE — Anesthesia Postprocedure Evaluation (Signed)
Anesthesia Post Note  Patient: [name]  Procedure(s) Performed: Procedure(s) (LRB): COLONOSCOPY WITH PROPOFOL (N/A)  Patient location during evaluation: PACU Anesthesia Type: MAC Level of consciousness: awake and alert Pain management: pain level controlled Respiratory status: spontaneous breathing Cardiovascular status: stable Postop Assessment: no signs of nausea or vomiting Anesthetic complications: no    Last Vitals:  Filed Vitals:   04/08/15 0850 04/08/15 0921  BP: 108/60 117/64  Pulse:    Temp:  36.9 C  Resp: 15 16    Last Pain:  Filed Vitals:   04/08/15 0928  PainSc: 0-No pain                 Laquon Emel

## 2015-04-08 NOTE — H&P (View-Only) (Signed)
Primary Care Physician:  Lilyan Punt, MD Primary Gastroenterologist:  Dr. Jena Gauss   Chief Complaint  Patient presents with  . Constipation    HPI:   [name] is a 50 y.o. female presenting today at the request of her PCP due to constipation. She has never had a complete lower GI evaluation.   Taking Linzess 145 mcg with loose BMs twice a day. Taking daily. Was noting low-volume hematochezia in setting of constipation. Occasional LLQ discomfort but has improved after starting Linzess. Previously would hurt on left side. Some nausea prior to starting Linzess but now resolved. No vomiting, just heaving, in setting of constipation but now resolved.   Past Medical History  Diagnosis Date  . Hypertension   . Diabetes mellitus without complication (HCC)   . Migraine headache   . Allergy   . Sleep apnea   . Pulmonary nodule   . Hidradenitis suppurativa   . Folliculitis 07/20/2012    Past Surgical History  Procedure Laterality Date  . Abdominal hysterectomy    . Knee surgery Right 2012  . Endometrial ablation    . Pilonidal cyst excision  June 2011  . Foreign body removal N/A 05/26/2013    Procedure: FOREIGN BODY REMOVAL ADULT ABDOMINAL WALL;  Surgeon: Dalia Heading, MD;  Location: [facility];  Service: General;  Laterality: N/A;    Current Outpatient Prescriptions  Medication Sig Dispense Refill  . aspirin 81 MG tablet Take 81 mg by mouth daily.    . diclofenac (VOLTAREN) 75 MG EC tablet     . diphenhydrAMINE (BENADRYL) 25 MG tablet Take 25 mg by mouth 2 (two) times daily as needed for allergies.    Marland Kitchen escitalopram (LEXAPRO) 20 MG tablet     . fluticasone (FLONASE) 50 MCG/ACT nasal spray Place 2 sprays into both nostrils daily as needed for allergies or rhinitis. 16 g 1  . HYDROcodone-acetaminophen (NORCO) 10-325 MG tablet Take 1 tablet by mouth every 4 (four) hours as needed. 40 tablet 0  . ketoconazole (NIZORAL) 2 % cream Apply 1 application topically 2 (two) times  daily. 30 g 4  . Linaclotide (LINZESS) 145 MCG CAPS capsule Take 1 capsule (145 mcg total) by mouth daily. 30 capsule 3  . loratadine (CLARITIN) 10 MG tablet Take 1 tablet (10 mg total) by mouth daily as needed for allergies. 90 tablet 1  . metoprolol (LOPRESSOR) 100 MG tablet Take 1 tablet (100 mg total) by mouth 2 (two) times daily. 180 tablet 1  . nystatin-triamcinolone ointment (MYCOLOG) Apply topically 2 (two) times daily. To affected area. 30 g prn  . promethazine (PHENERGAN) 25 MG tablet Take 1 tablet (25 mg total) by mouth every 6 (six) hours as needed for nausea. 30 tablet 2  . traZODone (DESYREL) 100 MG tablet Take 1 tablet (100 mg total) by mouth at bedtime. 30 tablet 5  . triamcinolone cream (KENALOG) 0.1 % APPLY ONE APPLICATION TOPICALLY TWICE DAILY AS NEEDED FOR RASH; USE UP TO 2 WEEKS 30 g 5  . triamterene-hydrochlorothiazide (MAXZIDE-25) 37.5-25 MG per tablet Take 1 tablet by mouth daily. 90 tablet 1  . ULTICARE PEN NEEDLES 29G X MISC USE DAILY AS DIRECTED FOR VICTOZA PEN 100 each 0  . VICTOZA 18 MG/3ML SOPN INJECT 1.8 MG SUBCUTANEOUSLY ONCE DAILY 9 pen 1   No current facility-administered medications for this visit.    Allergies as of 03/14/2015 - Review Complete 03/14/2015  Allergen Reaction Noted  . Bactrim [sulfamethoxazole-trimethoprim] Rash 03/16/2013  .  Lisinopril  08/28/2014  . Other  08/03/2012    Family History  Problem Relation Age of Onset  . Stroke Mother   . Hypertension Mother   . Thyroid disease Father   . Hypertension Sister   . Hypertension Brother   . Cancer Paternal Grandfather     prostate  . Colon cancer Maternal Grandfather     Social History   Social History  . Marital Status: Single    Spouse Name: N/A  . Number of Children: N/A  . Years of Education: N/A   Occupational History  . unemployed    Social History Main Topics  . Smoking status: Never Smoker   . Smokeless tobacco: Never Used  . Alcohol Use: No  . Drug Use: No    . Sexual Activity: Not Currently    Birth Control/ Protection: Surgical   Other Topics Concern  . Not on file   Social History Narrative    Review of Systems: Gen: see HPI  CV: Denies chest pain, heart palpitations, peripheral edema, syncope.  Resp: occasional DOE GI: see HPI  GU : nocturnal urination  MS: +joint pain  Derm: Denies rash, itching, dry skin Psych: Denies depression, anxiety, memory loss, and confusion Heme: see HPI   Physical Exam: BP 126/74 mmHg  Pulse 67  Temp(Src) 97.1 F (36.2 C)  Ht  (1.676 m)  Wt 219 lb 12.8 oz (99.701 kg)  BMI 35.49 kg/m2 General:   Alert and oriented. Pleasant and cooperative. Well-nourished and well-developed.  Head:  Normocephalic and atraumatic. Eyes:  Without icterus, sclera clear and conjunctiva pink.  Ears:  Normal auditory acuity. Nose:  No deformity, discharge,  or lesions. Mouth:  No deformity or lesions, oral mucosa pink.  Lungs:  Clear to auscultation bilaterally. No wheezes, rales, or rhonchi. No distress.  Heart:  S1, S2 present without murmurs appreciated.  Abdomen:  +BS, soft, non-tender and non-distended. No HSM noted. No guarding or rebound. No masses appreciated.  Rectal:  Deferred  Msk:  Symmetrical without gross deformities. Normal posture. Extremities:  Without edema. Neurologic:  Alert and  oriented x4;  grossly normal neurologically. Psych:  Alert and cooperative. Normal mood and affect.

## 2015-04-08 NOTE — Interval H&P Note (Signed)
History and Physical Interval Note:  04/08/2015 8:46 AM  [name]  has presented today for surgery, with the diagnosis of screening colonoscopy  The various methods of treatment have been discussed with the patient and family. After consideration of risks, benefits and other options for treatment, the patient has consented to  Procedure(s) with comments: COLONOSCOPY WITH PROPOFOL (N/A) - 0815 - moved to 9:00 - office to notify as a surgical intervention .  The patient's history has been reviewed, patient examined, no change in status, stable for surgery.  I have reviewed the patient's chart and labs.  Questions were answered to the patient's satisfaction.     [name]  No change. Patient denies rectal bleeding. Constipation well-managed on Linzess;  here for first ever screening colonoscopy per plan..  The risks, benefits, limitations, alternatives and imponderables have been reviewed with the patient. Questions have been answered. All parties are agreeable.

## 2015-04-10 ENCOUNTER — Encounter (HOSPITAL_COMMUNITY): Payer: Self-pay | Admitting: Internal Medicine

## 2015-05-02 ENCOUNTER — Ambulatory Visit (INDEPENDENT_AMBULATORY_CARE_PROVIDER_SITE_OTHER): Payer: Medicare Other | Admitting: Nurse Practitioner

## 2015-05-02 ENCOUNTER — Telehealth: Payer: Self-pay | Admitting: Nurse Practitioner

## 2015-05-02 ENCOUNTER — Encounter: Payer: Self-pay | Admitting: Nurse Practitioner

## 2015-05-02 VITALS — BP 116/82 | Temp 98.3°F | Ht 65.0 in | Wt 223.4 lb

## 2015-05-02 DIAGNOSIS — Z139 Encounter for screening, unspecified: Secondary | ICD-10-CM

## 2015-05-02 DIAGNOSIS — Z113 Encounter for screening for infections with a predominantly sexual mode of transmission: Secondary | ICD-10-CM

## 2015-05-02 DIAGNOSIS — A5901 Trichomonal vulvovaginitis: Secondary | ICD-10-CM

## 2015-05-02 MED ORDER — METRONIDAZOLE 500 MG PO TABS: ORAL_TABLET | ORAL | Status: DC

## 2015-05-02 NOTE — Telephone Encounter (Signed)
Patient stated that she had a colonoscopy done in February and never got the results. States she was told to call our office for the results. Was wondering if you could give her results.

## 2015-05-03 ENCOUNTER — Encounter: Payer: Self-pay | Admitting: Nurse Practitioner

## 2015-05-03 LAB — POCT WET PREP WITH KOH
CLUE CELLS WET PREP PER HPF POC: NEGATIVE
KOH Prep POC: NEGATIVE
TRICHOMONAS UA: POSITIVE
pH, Wet Prep: 5

## 2015-05-03 LAB — RPR: RPR: NONREACTIVE

## 2015-05-03 LAB — HEPATITIS C ANTIBODY

## 2015-05-03 LAB — HIV ANTIBODY (ROUTINE TESTING W REFLEX): HIV Screen 4th Generation wRfx: NONREACTIVE

## 2015-05-03 NOTE — Telephone Encounter (Signed)
They were supposed to discuss with her but I reviewed her test. Colonoscopy normal; repeat in 10 years

## 2015-05-03 NOTE — Telephone Encounter (Signed)
Discussed with pt that colonoscopy was normal and to repeat again in 10 years.

## 2015-05-03 NOTE — Progress Notes (Signed)
Subjective:  Presents for complaints of vaginal discharge for the past 4 days. No relief with OTC yeast treatment. Slight yellow-green color. No odor. No fever pelvic pain. No urinary symptoms. No new sexual partners. No nausea or vomiting.  Objective:   BP 116/82 mmHg  Temp(Src) 98.3 F (36.8 C) (Oral)  Ht 5\' 5"  (1.651 m)  Wt 223 lb 6 oz (101.322 kg)  BMI 37.17 kg/m2 NAD. Alert, oriented. Lungs clear. Heart regular rate rhythm. Abdomen soft nontender. Wet prep pH 5.0 with Trichomonas noted. WBCs TNTC.   Assessment: Trichomonas vaginitis  Screen for STD (sexually transmitted disease) - Plan: HIV antibody (with reflex), RPR, GC/Chlamydia Probe Amp  Screening - Plan: Hepatitis C Antibody  Plan:  Meds ordered this encounter  Medications  . Liraglutide (VICTOZA [place])    Sig: Inject into the skin.  Marland Kitchen. metroNIDAZOLE (FLAGYL) 500 MG tablet    Sig: 4 po x 1 dose    Dispense:  4 tablet    Refill:  0    Order Specific Question:  Supervising Provider    Answer:  Merlyn AlbertLUKING, WILLIAM S [[id number]]   Discussed safe sex issues. Patient advised that her partner needs to get treated or she can get reinfected. Remainder of STD testing pending. Call back if persists.

## 2015-05-04 LAB — GC/CHLAMYDIA PROBE AMP
Chlamydia trachomatis, NAA: NEGATIVE
Neisseria gonorrhoeae by PCR: NEGATIVE

## 2015-06-16 ENCOUNTER — Encounter (HOSPITAL_COMMUNITY): Payer: Self-pay | Admitting: Emergency Medicine

## 2015-06-16 ENCOUNTER — Emergency Department (HOSPITAL_COMMUNITY)
Admission: EM | Admit: 2015-06-16 | Discharge: 2015-06-17 | Disposition: A | Discharge status: Home / Self Care | Payer: Medicare Other | Attending: Emergency Medicine | Admitting: Emergency Medicine

## 2015-06-16 ENCOUNTER — Emergency Department (HOSPITAL_COMMUNITY): Payer: Medicare Other

## 2015-06-16 DIAGNOSIS — S20212A Contusion of left front wall of thorax, initial encounter: Secondary | ICD-10-CM | POA: Diagnosis not present

## 2015-06-16 DIAGNOSIS — Z79899 Other long term (current) drug therapy: Secondary | ICD-10-CM | POA: Insufficient documentation

## 2015-06-16 DIAGNOSIS — S29019A Strain of muscle and tendon of unspecified wall of thorax, initial encounter: Secondary | ICD-10-CM

## 2015-06-16 DIAGNOSIS — Z872 Personal history of diseases of the skin and subcutaneous tissue: Secondary | ICD-10-CM | POA: Diagnosis not present

## 2015-06-16 DIAGNOSIS — S39012A Strain of muscle, fascia and tendon of lower back, initial encounter: Secondary | ICD-10-CM | POA: Diagnosis not present

## 2015-06-16 DIAGNOSIS — S0990XA Unspecified injury of head, initial encounter: Secondary | ICD-10-CM | POA: Insufficient documentation

## 2015-06-16 DIAGNOSIS — S299XXA Unspecified injury of thorax, initial encounter: Secondary | ICD-10-CM | POA: Diagnosis not present

## 2015-06-16 DIAGNOSIS — F419 Anxiety disorder, unspecified: Secondary | ICD-10-CM | POA: Diagnosis not present

## 2015-06-16 DIAGNOSIS — Y998 Other external cause status: Secondary | ICD-10-CM | POA: Insufficient documentation

## 2015-06-16 DIAGNOSIS — S29012A Strain of muscle and tendon of back wall of thorax, initial encounter: Secondary | ICD-10-CM | POA: Diagnosis not present

## 2015-06-16 DIAGNOSIS — E119 Type 2 diabetes mellitus without complications: Secondary | ICD-10-CM | POA: Diagnosis not present

## 2015-06-16 DIAGNOSIS — Y9389 Activity, other specified: Secondary | ICD-10-CM | POA: Diagnosis not present

## 2015-06-16 DIAGNOSIS — M1990 Unspecified osteoarthritis, unspecified site: Secondary | ICD-10-CM | POA: Insufficient documentation

## 2015-06-16 DIAGNOSIS — Y92410 Unspecified street and highway as the place of occurrence of the external cause: Secondary | ICD-10-CM | POA: Insufficient documentation

## 2015-06-16 DIAGNOSIS — G43909 Migraine, unspecified, not intractable, without status migrainosus: Secondary | ICD-10-CM | POA: Insufficient documentation

## 2015-06-16 DIAGNOSIS — S199XXA Unspecified injury of neck, initial encounter: Secondary | ICD-10-CM | POA: Diagnosis present

## 2015-06-16 DIAGNOSIS — I10 Essential (primary) hypertension: Secondary | ICD-10-CM | POA: Diagnosis not present

## 2015-06-16 DIAGNOSIS — M545 Low back pain: Secondary | ICD-10-CM | POA: Diagnosis not present

## 2015-06-16 DIAGNOSIS — S161XXA Strain of muscle, fascia and tendon at neck level, initial encounter: Secondary | ICD-10-CM | POA: Insufficient documentation

## 2015-06-16 DIAGNOSIS — S3992XA Unspecified injury of lower back, initial encounter: Secondary | ICD-10-CM | POA: Diagnosis not present

## 2015-06-16 MED ORDER — ONDANSETRON HCL 4 MG/2ML IJ SOLN
4.0000 mg | Freq: Once | INTRAMUSCULAR | Status: AC
  Administered 2015-06-17: 4 mg via INTRAVENOUS
  Filled 2015-06-16: qty 2

## 2015-06-16 MED ORDER — MORPHINE SULFATE (PF) 4 MG/ML IV SOLN
4.0000 mg | Freq: Once | INTRAVENOUS | Status: AC
  Administered 2015-06-17: 4 mg via INTRAVENOUS
  Filled 2015-06-16: qty 1
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
## 2015-06-17 ENCOUNTER — Emergency Department (HOSPITAL_COMMUNITY): Payer: Medicare Other

## 2015-06-17 DIAGNOSIS — S0990XA Unspecified injury of head, initial encounter: Secondary | ICD-10-CM | POA: Diagnosis not present

## 2015-06-17 DIAGNOSIS — S299XXA Unspecified injury of thorax, initial encounter: Secondary | ICD-10-CM | POA: Diagnosis not present

## 2015-06-17 DIAGNOSIS — S199XXA Unspecified injury of neck, initial encounter: Secondary | ICD-10-CM | POA: Diagnosis not present

## 2015-06-17 DIAGNOSIS — M545 Low back pain: Secondary | ICD-10-CM | POA: Diagnosis not present

## 2015-06-17 DIAGNOSIS — S3992XA Unspecified injury of lower back, initial encounter: Secondary | ICD-10-CM | POA: Diagnosis not present

## 2015-06-17 DIAGNOSIS — S161XXA Strain of muscle, fascia and tendon at neck level, initial encounter: Secondary | ICD-10-CM | POA: Diagnosis not present

## 2015-06-17 MED ORDER — CYCLOBENZAPRINE HCL 10 MG PO TABS: 10.0000 mg | ORAL_TABLET | Freq: Three times a day (TID) | ORAL | Status: DC | PRN

## 2015-06-17 MED ORDER — HYDROMORPHONE HCL 1 MG/ML IJ SOLN
1.0000 mg | Freq: Once | INTRAMUSCULAR | Status: AC
  Administered 2015-06-17: 1 mg via INTRAVENOUS
  Filled 2015-06-17: qty 1

## 2015-06-17 MED ORDER — ONDANSETRON 4 MG PO TBDP
4.0000 mg | ORAL_TABLET | Freq: Once | ORAL | Status: AC
  Administered 2015-06-17: 4 mg via ORAL
  Filled 2015-06-17: qty 1

## 2015-06-17 MED ORDER — IBUPROFEN 800 MG PO TABS: 800.0000 mg | ORAL_TABLET | Freq: Three times a day (TID) | ORAL | Status: DC

## 2015-06-17 NOTE — Discharge Instructions (Signed)
Cervical Sprain °A cervical sprain is an injury in the neck in which the strong, fibrous tissues (ligaments) that connect your neck bones stretch or tear. Cervical sprains can range from mild to severe. Severe cervical sprains can cause the neck vertebrae to be unstable. This can lead to damage of the spinal cord and can result in serious nervous system problems. The amount of time it takes for a cervical sprain to get better depends on the cause and extent of the injury. Most cervical sprains heal in 1 to 3 weeks. °CAUSES  °Severe cervical sprains may be caused by:  °· Contact sport injuries (such as from football, rugby, wrestling, hockey, auto racing, gymnastics, diving, martial arts, or boxing).   °· Motor vehicle collisions.   °· Whiplash injuries. This is an injury from a sudden forward and backward whipping movement of the head and neck.  °· Falls.   °Mild cervical sprains may be caused by:  °· Being in an awkward position, such as while cradling a telephone between your ear and shoulder.   °· Sitting in a chair that does not offer proper support.   °· Working at a poorly designed computer station.   °· Looking up or down for long periods of time.   °SYMPTOMS  °· Pain, soreness, stiffness, or a burning sensation in the front, back, or sides of the neck. This discomfort may develop immediately after the injury or slowly, 24 hours or more after the injury.   °· Pain or tenderness directly in the middle of the back of the neck.   °· Shoulder or upper back pain.   °· Limited ability to move the neck.   °· Headache.   °· Dizziness.   °· Weakness, numbness, or tingling in the hands or arms.   °· Muscle spasms.   °· Difficulty swallowing or chewing.   °· Tenderness and swelling of the neck.   °DIAGNOSIS  °Most of the time your health care provider can diagnose a cervical sprain by taking your history and doing a physical exam. Your health care provider will ask about previous neck injuries and any known neck  problems, such as arthritis in the neck. X-rays may be taken to find out if there are any other problems, such as with the bones of the neck. Other tests, such as a CT scan or MRI, may also be needed.  °TREATMENT  °Treatment depends on the severity of the cervical sprain. Mild sprains can be treated with rest, keeping the neck in place (immobilization), and pain medicines. Severe cervical sprains are immediately immobilized. Further treatment is done to help with pain, muscle spasms, and other symptoms and may include: °· Medicines, such as pain relievers, numbing medicines, or muscle relaxants.   °· Physical therapy. This may involve stretching exercises, strengthening exercises, and posture training. Exercises and improved posture can help stabilize the neck, strengthen muscles, and help stop symptoms from returning.   °HOME CARE INSTRUCTIONS  °· Put ice on the injured area.   °¨ Put ice in a plastic bag.   °¨ Place a towel between your skin and the bag.   °¨ Leave the ice on for 15-20 minutes, 3-4 times a day.   °· If your injury was severe, you may have been given a cervical collar to wear. A cervical collar is a two-piece collar designed to keep your neck from moving while it heals. °¨ Do not remove the collar unless instructed by your health care provider. °¨ If you have long hair, keep it outside of the collar. °¨ Ask your health care provider before making any adjustments to your collar. Minor   adjustments may be required over time to improve comfort and reduce pressure on your chin or on the back of your head.  Ifyou are allowed to remove the collar for cleaning or bathing, follow your health care provider's instructions on how to do so safely.  Keep your collar clean by wiping it with mild soap and water and drying it completely. If the collar you have been given includes removable pads, remove them every 1-2 days and hand wash them with soap and water. Allow them to air dry. They should be completely  dry before you wear them in the collar.  If you are allowed to remove the collar for cleaning and bathing, wash and dry the skin of your neck. Check your skin for irritation or sores. If you see any, tell your health care provider.  Do not drive while wearing the collar.   Only take over-the-counter or prescription medicines for pain, discomfort, or fever as directed by your health care provider.   Keep all follow-up appointments as directed by your health care provider.   Keep all physical therapy appointments as directed by your health care provider.   Make any needed adjustments to your workstation to promote good posture.   Avoid positions and activities that make your symptoms worse.   Warm up and stretch before being active to help prevent problems.  SEEK MEDICAL CARE IF:   Your pain is not controlled with medicine.   You are unable to decrease your pain medicine over time as planned.   Your activity level is not improving as expected.  SEEK IMMEDIATE MEDICAL CARE IF:   You develop any bleeding.  You develop stomach upset.  You have signs of an allergic reaction to your medicine.   Your symptoms get worse.   You develop new, unexplained symptoms.   You have numbness, tingling, weakness, or paralysis in any part of your body.  MAKE SURE YOU:   Understand these instructions.  Will watch your condition.  Will get help right away if you are not doing well or get worse.   This information is not intended to replace advice given to you by your health care provider. Make sure you discuss any questions you have with your health care provider.   Document Released: 11/23/2006 Document Revised: 01/31/2013 Document Reviewed: 08/03/2012 Elsevier Interactive Patient Education 2016 [employer]  Lumbosacral Strain Lumbosacral strain is a strain of any of the parts that make up your lumbosacral vertebrae. Your lumbosacral vertebrae are the bones that make up  the lower third of your backbone. Your lumbosacral vertebrae are held together by muscles and tough, fibrous tissue (ligaments).  CAUSES  A sudden blow to your back can cause lumbosacral strain. Also, anything that causes an excessive stretch of the muscles in the low back can cause this strain. This is typically seen when people exert themselves strenuously, fall, lift heavy objects, bend, or crouch repeatedly. RISK FACTORS  Physically demanding work.  Participation in pushing or pulling sports or sports that require a sudden twist of the back (tennis, golf, baseball).  Weight lifting.  Excessive lower back curvature.  Forward-tilted pelvis.  Weak back or abdominal muscles or both.  Tight hamstrings. SIGNS AND SYMPTOMS  Lumbosacral strain may cause pain in the area of your injury or pain that moves (radiates) down your leg.  DIAGNOSIS Your health care provider can often diagnose lumbosacral strain through a physical exam. In some cases, you may need tests such as X-ray exams.  TREATMENT  Treatment for your lower back injury depends on many factors that your clinician will have to evaluate. However, most treatment will include the use of anti-inflammatory medicines. HOME CARE INSTRUCTIONS   Avoid hard physical activities (tennis, racquetball, waterskiing) if you are not in proper physical condition for it. This may aggravate or create problems.  If you have a back problem, avoid sports requiring sudden body movements. Swimming and walking are generally safer activities.  Maintain good posture.  Maintain a healthy weight.  For acute conditions, you may put ice on the injured area.  Put ice in a plastic bag.  Place a towel between your skin and the bag.  Leave the ice on for 20 minutes, 2-3 times a day.  When the low back starts healing, stretching and strengthening exercises may be recommended. SEEK MEDICAL CARE IF:  Your back pain is getting worse.  You experience  severe back pain not relieved with medicines. SEEK IMMEDIATE MEDICAL CARE IF:   You have numbness, tingling, weakness, or problems with the use of your arms or legs.  There is a change in bowel or bladder control.  You have increasing pain in any area of the body, including your belly (abdomen).  You notice shortness of breath, dizziness, or feel faint.  You feel sick to your stomach (nauseous), are throwing up (vomiting), or become sweaty.  You notice discoloration of your toes or legs, or your feet get very cold. MAKE SURE YOU:   Understand these instructions.  Will watch your condition.  Will get help right away if you are not doing well or get worse.   This information is not intended to replace advice given to you by your health care provider. Make sure you discuss any questions you have with your health care provider.   Document Released: 11/05/2004 Document Revised: 02/16/2014 Document Reviewed: 09/14/2012 Elsevier Interactive Patient Education 2016 [employer]  Thoracic Strain A thoracic strain, which is sometimes called a mid-back strain, is an injury to the muscles or tendons that attach to the upper part of your back behind your chest. This type of injury occurs when a muscle is overstretched or overloaded.  Thoracic strains can range from mild to severe. Mild strains may involve stretching a muscle or tendon without tearing it. These injuries may heal in 1-2 weeks. More severe strains involve tearing of muscle fibers or tendons. These will cause more pain and may take 6-8 weeks to heal. CAUSES This condition may be caused by:  An injury in which a sudden force is placed on the muscle.  Exercising without properly warming up.  Overuse of the muscle.  Improper form during certain movements.  Other injuries that surround or cause stress on the mid-back, causing a strain on the muscles. In some cases, the cause may not be known. RISK FACTORS This injury is more  common in:  Athletes.  People with obesity. SYMPTOMS The main symptom of this condition is pain, especially with movement. Other symptoms include:  Bruising.  Swelling.  Spasm. DIAGNOSIS This condition may be diagnosed with a physical exam. X-rays may be taken to check for a fracture. TREATMENT This condition may be treated with:  Resting and icing the injured area.  Physical therapy. This will involve doing stretching and strengthening exercises.  Medicines for pain and inflammation. HOME CARE INSTRUCTIONS  Rest as needed. Follow instructions from your health care provider about any restrictions on activity.  If directed, apply ice to the injured area:  Put ice  in a plastic bag.  Place a towel between your skin and the bag.  Leave the ice on for 20 minutes, 2-3 times per day.  Take over-the-counter and prescription medicines only as told by your health care provider.  Begin doing exercises as told by your health care provider or physical therapist.  Always warm up properly before physical activity or sports.  Bend your knees before you lift heavy objects.  Keep all follow-up visits as told by your health care provider. This is important. SEEK MEDICAL CARE IF:  Your pain is not helped by medicine.  Your pain, bruising, or swelling is getting worse.  You have a fever. SEEK IMMEDIATE MEDICAL CARE IF:  You have shortness of breath.  You have chest pain.  You develop numbness or weakness in your legs.  You have involuntary loss of urine (urinary incontinence).   This information is not intended to replace advice given to you by your health care provider. Make sure you discuss any questions you have with your health care provider.   Document Released: 04/18/2003 Document Revised: 10/17/2014 Document Reviewed: 03/22/2014 Elsevier Interactive Patient Education 2016 [employer]  Chest Contusion A chest contusion is a deep bruise on your chest area.  Contusions are the result of an injury that caused bleeding under the skin. A chest contusion may involve bruising of the skin, muscles, or ribs. The contusion may turn blue, purple, or yellow. Minor injuries will give you a painless contusion, but more severe contusions may stay painful and swollen for a few weeks. CAUSES  A contusion is usually caused by a blow, trauma, or direct force to an area of the body. SYMPTOMS   Swelling and redness of the injured area.  Discoloration of the injured area.  Tenderness and soreness of the injured area.  Pain. DIAGNOSIS  The diagnosis can be made by taking a history and performing a physical exam. An X-ray, CT scan, or MRI may be needed to determine if there were any associated injuries, such as broken bones (fractures) or internal injuries. TREATMENT  Often, the best treatment for a chest contusion is resting, icing, and applying cold compresses to the injured area. Deep breathing exercises may be recommended to reduce the risk of pneumonia. Over-the-counter medicines may also be recommended for pain control. HOME CARE INSTRUCTIONS   Put ice on the injured area.  Put ice in a plastic bag.  Place a towel between your skin and the bag.  Leave the ice on for 15-20 minutes, 03-04 times a day.  Only take over-the-counter or prescription medicines as directed by your caregiver. Your caregiver may recommend avoiding anti-inflammatory medicines (aspirin, ibuprofen, and naproxen) for 48 hours because these medicines may increase bruising.  Rest the injured area.  Perform deep-breathing exercises as directed by your caregiver.  Stop smoking if you smoke.  Do not lift objects over 5 pounds (2.3 kg) for 3 days or longer if recommended by your caregiver. SEEK IMMEDIATE MEDICAL CARE IF:   You have increased bruising or swelling.  You have pain that is getting worse.  You have difficulty breathing.  You have dizziness, weakness, or  fainting.  You have blood in your urine or stool.  You cough up or vomit blood.  Your swelling or pain is not relieved with medicines. MAKE SURE YOU:   Understand these instructions.  Will watch your condition.  Will get help right away if you are not doing well or get worse.   This information is  not intended to replace advice given to you by your health care provider. Make sure you discuss any questions you have with your health care provider.   Document Released: 10/21/2000 Document Revised: 10/21/2011 Document Reviewed: 07/20/2011 Elsevier Interactive Patient Education [employer]

## 2015-06-24 ENCOUNTER — Telehealth: Payer: Self-pay | Admitting: Family Medicine

## 2015-06-24 DIAGNOSIS — M542 Cervicalgia: Secondary | ICD-10-CM
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
## 2015-06-24 NOTE — Telephone Encounter (Signed)
Referral ordered in EPIC. Patient notified. 

## 2015-06-24 NOTE — Telephone Encounter (Signed)
[facility]

## 2015-06-24 NOTE — Telephone Encounter (Signed)
[facility] 5/15 

## 2015-06-24 NOTE — Telephone Encounter (Signed)
Lets do per pt request
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
## 2015-06-26 DIAGNOSIS — M542 Cervicalgia: Secondary | ICD-10-CM | POA: Diagnosis not present

## 2015-07-01 ENCOUNTER — Other Ambulatory Visit: Payer: Self-pay | Admitting: Family Medicine

## 2015-07-10 ENCOUNTER — Ambulatory Visit (HOSPITAL_COMMUNITY): Payer: Medicare Other | Attending: Sports Medicine | Admitting: Physical Therapy

## 2015-07-10 DIAGNOSIS — M25612 Stiffness of left shoulder, not elsewhere classified: Secondary | ICD-10-CM | POA: Diagnosis not present

## 2015-07-10 DIAGNOSIS — M25611 Stiffness of right shoulder, not elsewhere classified: Secondary | ICD-10-CM | POA: Insufficient documentation

## 2015-07-10 DIAGNOSIS — R293 Abnormal posture: Secondary | ICD-10-CM

## 2015-07-10 DIAGNOSIS — M542 Cervicalgia: Secondary | ICD-10-CM | POA: Insufficient documentation

## 2015-07-10 NOTE — Patient Instructions (Signed)
3D CERVICAL EXCURSIONS (ATTACHED PAPER)  Repeat 10 times each direction, each side, at least 2-3 times per day.  Only move your neck through a pain free range- IE, do not try to push through any acute/sharp pains.    Shoulder Rolls  Sit with arms resting in your lap. Start with arm circles backwards, focusing on moving the shoulder while keeping the arm resting in your lap.   Repeat 10 times backwards, 2-3 times per day in a pain-free range.     RETRACTION / CHIN TUCK  Slowly draw your head back so that your ears line up with your shoulders.   Repeat 10 times, 2-3 times per day.
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
## 2015-07-16 ENCOUNTER — Ambulatory Visit (HOSPITAL_COMMUNITY): Payer: Medicare Other | Attending: Sports Medicine | Admitting: Physical Therapy

## 2015-07-16 DIAGNOSIS — M62830 Muscle spasm of back: Secondary | ICD-10-CM | POA: Insufficient documentation

## 2015-07-16 DIAGNOSIS — M25612 Stiffness of left shoulder, not elsewhere classified: Secondary | ICD-10-CM

## 2015-07-16 DIAGNOSIS — M6281 Muscle weakness (generalized): Secondary | ICD-10-CM | POA: Diagnosis not present

## 2015-07-16 DIAGNOSIS — R293 Abnormal posture: Secondary | ICD-10-CM

## 2015-07-16 DIAGNOSIS — M5416 Radiculopathy, lumbar region: Secondary | ICD-10-CM | POA: Insufficient documentation

## 2015-07-16 DIAGNOSIS — M25611 Stiffness of right shoulder, not elsewhere classified: Secondary | ICD-10-CM

## 2015-07-16 DIAGNOSIS — M542 Cervicalgia: Secondary | ICD-10-CM | POA: Diagnosis not present

## 2015-07-16 DIAGNOSIS — M5442 Lumbago with sciatica, left side: Secondary | ICD-10-CM | POA: Insufficient documentation

## 2015-07-16 NOTE — Therapy (Signed)
[facility] [address] [place], [place], [address] Phone: [phone]   Fax:  [phone]  Physical Therapy Treatment  Patient Details  Name: [name] MRN: [id number] Date of Birth: [birth year] Referring Provider: Pati GalloJames Kramer   Encounter Date: 07/16/2015      PT End of Session - 07/16/15 1209    Visit Number 2   Number of Visits 16   Date for PT Re-Evaluation 08/07/15   Authorization Type Medicare    Authorization Time Period 07/10/15 to 09/09/15   Authorization - Visit Number 1   Authorization - Number of Visits 10   PT Start Time 0902   PT Stop Time 0943   PT Time Calculation (min) 41 min   Activity Tolerance Patient limited by pain   Behavior [facility] for tasks assessed/performed      Past Medical History  Diagnosis Date  . Hypertension   . Diabetes mellitus without complication (HCC)   . Migraine headache   . Allergy   . Sleep apnea   . Pulmonary nodule   . Hidradenitis suppurativa   . Folliculitis 07/20/2012  . Anxiety   . Arthritis     Past Surgical History  Procedure Laterality Date  . Abdominal hysterectomy    . Knee surgery Right 2012  . Endometrial ablation    . Pilonidal cyst excision  June 2011  . Foreign body removal N/A 05/26/2013    Procedure: FOREIGN BODY REMOVAL ADULT ABDOMINAL WALL;  Surgeon: Dalia HeadingMark A Jenkins, MD;  Location: [facility];  Service: General;  Laterality: N/A;  . Colonoscopy with propofol N/A 04/08/2015    Procedure: COLONOSCOPY WITH PROPOFOL;  Surgeon: Corbin Adeobert M Rourk, MD;  Location: [facility];  Service: Endoscopy;  Laterality: N/A;  0815 - moved to 9:00 - office to notify    There were no vitals filed for this visit.      Subjective Assessment - 07/16/15 0904    Subjective Pt states she is worse since her last visit. She has had difficutly sleeping due to throbbing/achy pain along her Lt shoulder and Lt neck.    Pertinent History DM and HTN    How long can you sit  comfortably? not very long (unable to identify specific number)   How long can you stand comfortably? not long (unable to give specific number)   How long can you walk comfortably? painful, limited (unable to identify specific number)   Patient Stated Goals get better    Currently in Pain? Yes   Pain Score 8    Pain Location --  Lt neck and shoulder   Pain Descriptors / Indicators Aching;Throbbing   Pain Type Chronic pain   Pain Radiating Towards none    Pain Onset 1 to 4 weeks ago   Pain Frequency Constant   Aggravating Factors  any activity   Pain Relieving Factors muscle relaxers and pain meds, laying on her back   Effect of Pain on Daily Activities unable to sleep                          [facility]/Exercise - 07/16/15 0001    Exercises   Exercises Neck;Shoulder   Neck Exercises: Supine   Other Supine Exercise L/R rotation attempted   Manual Therapy   Manual Therapy Soft tissue mobilization;Joint mobilization;Passive ROM   Joint Mobilization grade I-II PA jt mobs to upper thoracic spine in prone; gentle Lt scapular  mobs in all directions     Soft tissue mobilization STM to B upper trap, levator scap, thoracic/cervical paraspinals   Passive ROM L and R rotation x2 reps with poor tolerance, increased guarding                PT Education - 07/16/15 1207    Education provided Yes   Education Details reviewed/updated HEP; discussed importance of maintaining cerival ROM within tolerable pain range; log roll technique    Person(s) Educated Patient   Methods Explanation;Demonstration;Handout   Comprehension Verbalized understanding;Returned demonstration          PT Short Term Goals - 07/10/15 1007    PT SHORT TERM GOAL #1   Title Patient will demonstrate at least 25 degree improvement in cervical and shoulder ROM all planes in order to improve mechanics and reduce pain    Time 4   Period Weeks   Status New   PT SHORT TERM GOAL #2    Title Patient to experience no more than 5/10 pain in order to improve overall QOL    Time 4   Period Weeks   Status New   PT SHORT TERM GOAL #3   Title Patient to be able to maintain correct posture at least 70% of the time in order to assist in reducing pain and improving mechanics    Time 4   Period Weeks   Status New   PT SHORT TERM GOAL #4   Title Patient to be indpendent in correctly and consistently performing approipriate HEP, to be updated PRN    Time 4   Period Weeks   Status New           PT Long Term Goals - 07/10/15 1014    PT LONG TERM GOAL #1   Title Patient to demonstrate cervical and shoulder ROM as being WFL on all planes in order to improve function and QOL    Time 8   Period Weeks   Status New   PT LONG TERM GOAL #2   Title Patient to experience no more than 2/10 pain in order to improve overall function and QOL    Time 8   Period Weeks   Status New   PT LONG TERM GOAL #3   Title Patient to be able to perform all selfcare tassk such as hairwashing, dressing, and bathing in order to regain independence and improve QOL    Time 8   Period Weeks   Status New   PT LONG TERM GOAL #4   Title Patient to report she has been able to sleep through the night without waking up secondary to pain in order to improve overall QOL    Time 8   Period Weeks   Status New               Plan - 07/16/15 1210    Clinical Impression Statement Pt very guarded and with increased pain upon arrival, so this session focused on manual techniques and modalities to improve relaxation of musculature. Also educated pt on the importance of HEP adherence to improve overall relaxation and decrease pain levels. Reviewed tissue healing process with reported understanding. Pt with increased pain upon light palpation to her cervical spine, reporting 9/10 pain. By the end of today's session she was able to tolerate more soft tissue massage, however there was little pain relied reported.     Rehab Potential Good   PT Frequency 2x / week   PT Duration  8 weeks   PT Treatment/Interventions ADLs/Self Care Home Management;Cryotherapy;Biofeedback;Moist Heat;Functional mobility training;Therapeutic activities;Therapeutic exercise;Balance training;Neuromuscular re-education;Patient/family education;Manual techniques;Passive range of motion;Taping   PT Next Visit Plan start with moist heat and gradually work into gentle exercise and manual, postural training    PT Home Exercise Plan updated with supine chin tucks, cervical rotation   Consulted and Agree with Plan of Care Patient      Patient will benefit from skilled therapeutic intervention in order to improve the following deficits and impairments:  Hypomobility, Impaired sensation, Decreased activity tolerance, Decreased strength, Pain, Increased fascial restricitons, Decreased mobility, Increased muscle spasms, Decreased range of motion, Improper body mechanics, Postural dysfunction  Visit Diagnosis: Cervicalgia  Stiffness of left shoulder, not elsewhere classified  Stiffness of right shoulder, not elsewhere classified  Abnormal posture     Problem List Patient Active Problem List   Diagnosis Date Noted  . Colon cancer screening   . Encounter for screening colonoscopy 03/14/2015  . Insomnia 11/26/2014  . Varicose veins 11/26/2014  . Osteoarthritis of both knees 08/28/2014  . Sleep apnea 05/25/2014  . Depression 02/04/2014  . Skin abscess 12/01/2012  . Hidradenitis 08/03/2012  . Diabetes (HCC) 07/20/2012  . Folliculitis 07/20/2012  . Migraine 06/04/2008  . Essential hypertension 06/04/2008  . CONSTIPATION, CHRONIC 06/04/2008  . ARTHRITIS 06/04/2008  . ABDOMINAL PAIN 06/04/2008   12:22 PM,07/16/2015 Marylyn Ishihara PT, DPT [facility] [phone]  [facility] [address] [place], [place], [address] Phone: [phone]   Fax:   [phone]  Name: [name] MRN: [id number] Date of Birth: 1965-10-17

## 2015-07-16 NOTE — Patient Instructions (Signed)
AROM: Neck Rotation    Turn head slowly to look over one shoulder, then the other. Hold each position ____ seconds. Repeat ____ times per set. Do ____ sets per session. Do ____ sessions per day.  http://orth.exer.us/295   Copyright  VHI. All rights reserved.  CERVICAL CHIN TUCK  - SUPINE WITH TOWEL  While lying on your back with a small rolled up towel under the curve of your neck, tuck your chin towards your chest.    Maintain contact of your head with the surface you are lying on the entire time.

## 2015-07-19 ENCOUNTER — Ambulatory Visit (HOSPITAL_COMMUNITY): Payer: Medicare Other

## 2015-07-19 DIAGNOSIS — M25612 Stiffness of left shoulder, not elsewhere classified: Secondary | ICD-10-CM

## 2015-07-19 DIAGNOSIS — R293 Abnormal posture: Secondary | ICD-10-CM

## 2015-07-19 DIAGNOSIS — M542 Cervicalgia: Secondary | ICD-10-CM

## 2015-07-19 DIAGNOSIS — M25611 Stiffness of right shoulder, not elsewhere classified: Secondary | ICD-10-CM | POA: Diagnosis not present

## 2015-07-19 DIAGNOSIS — M5442 Lumbago with sciatica, left side: Secondary | ICD-10-CM | POA: Diagnosis not present

## 2015-07-19 DIAGNOSIS — M6281 Muscle weakness (generalized): Secondary | ICD-10-CM | POA: Diagnosis not present

## 2015-07-19 DIAGNOSIS — M62830 Muscle spasm of back: Secondary | ICD-10-CM | POA: Diagnosis not present

## 2015-07-19 DIAGNOSIS — M5416 Radiculopathy, lumbar region: Secondary | ICD-10-CM | POA: Diagnosis not present

## 2015-07-19 NOTE — Therapy (Signed)
[facility] [address] [place], [place], [address] Phone: [phone]   Fax:  [phone]  Physical Therapy Treatment  Patient Details  Name: [name] MRN: [id number] Date of Birth: [birth year] Referring Provider: Pati Gallo   Encounter Date: 07/19/2015      PT End of Session - 07/19/15 0919    Visit Number 3   Number of Visits 16   Date for PT Re-Evaluation 08/07/15   Authorization Type Medicare    Authorization Time Period 07/10/15 to 09/09/15   Authorization - Visit Number 3   Authorization - Number of Visits 10   PT Start Time 0903   PT Stop Time 0945   PT Time Calculation (min) 42 min   Activity Tolerance Patient limited by pain   Behavior During Therapy [facility] for tasks assessed/performed      Past Medical History  Diagnosis Date  . Hypertension   . Diabetes mellitus without complication (HCC)   . Migraine headache   . Allergy   . Sleep apnea   . Pulmonary nodule   . Hidradenitis suppurativa   . Folliculitis 07/20/2012  . Anxiety   . Arthritis     Past Surgical History  Procedure Laterality Date  . Abdominal hysterectomy    . Knee surgery Right 2012  . Endometrial ablation    . Pilonidal cyst excision  June 2011  . Foreign body removal N/A 05/26/2013    Procedure: FOREIGN BODY REMOVAL ADULT ABDOMINAL WALL;  Surgeon: Dalia Heading, MD;  Location: [facility];  Service: General;  Laterality: N/A;  . Colonoscopy with propofol N/A 04/08/2015    Procedure: COLONOSCOPY WITH PROPOFOL;  Surgeon: Corbin Ade, MD;  Location: [facility];  Service: Endoscopy;  Laterality: N/A;  0815 - moved to 9:00 - office to notify    There were no vitals filed for this visit.      Subjective Assessment - 07/19/15 0916    Subjective Pt stated she has difficutly sleeping more than 3 hours per night due to pain.  Pain scale 10/10 Bil neck and shoulder and lower back pain with throbbing/stabbing/achey pain.  Does not wish to go  to ER today   Pertinent History DM and HTN    Patient Stated Goals get better    Currently in Pain? Yes   Pain Score 10-Worst pain ever   Pain Location --  Bil neck and shoulder   Pain Orientation Right;Left   Pain Descriptors / Indicators Stabbing;Sharp;Aching   Pain Type Chronic pain   Pain Radiating Towards none   Pain Onset 1 to 4 weeks ago   Pain Frequency Constant   Aggravating Factors  any activity   Pain Relieving Factors muscle relaxers and pain meds, laying on her back   Effect of Pain on Daily Activities unable to sleep          [facility] Adult PT Treatment/Exercise - 07/19/15 0001    Neck Exercises: Seated   Neck Retraction 10 reps;5 secs   Cervical Rotation Both;10 reps   Cervical Rotation Limitations 3D cervical excursion   Lateral Flexion Both;10 reps   Lateral Flexion Limitations 3D cervical excursion   Other Seated Exercise thoracic excursion with arms on chest sagital plane only 10x   Modalities   Modalities Moist Heat   Moist Heat Therapy   Number Minutes Moist Heat 20 Minutes   Moist Heat Location Cervical;Lumbar Spine  shoulders and LBP   Manual Therapy   Manual Therapy  Soft tissue mobilization;Joint mobilization;Passive ROM   Manual therapy comments Manual complete separate rest of treatment   Soft tissue mobilization STM to B upper trap, levator scap, thoracic/cervical paraspinals   Passive ROM all directions with poor tolerance, increased guarding with cueing for diaphragmatic breathing                  PT Short Term Goals - 07/10/15 1007    PT SHORT TERM GOAL #1   Title Patient will demonstrate at least 25 degree improvement in cervical and shoulder ROM all planes in order to improve mechanics and reduce pain    Time 4   Period Weeks   Status New   PT SHORT TERM GOAL #2   Title Patient to experience no more than 5/10 pain in order to improve overall QOL    Time 4   Period Weeks   Status New   PT SHORT TERM GOAL #3   Title Patient to  be able to maintain correct posture at least 70% of the time in order to assist in reducing pain and improving mechanics    Time 4   Period Weeks   Status New   PT SHORT TERM GOAL #4   Title Patient to be indpendent in correctly and consistently performing approipriate HEP, to be updated PRN    Time 4   Period Weeks   Status New           PT Long Term Goals - 07/10/15 1014    PT LONG TERM GOAL #1   Title Patient to demonstrate cervical and shoulder ROM as being WFL on all planes in order to improve function and QOL    Time 8   Period Weeks   Status New   PT LONG TERM GOAL #2   Title Patient to experience no more than 2/10 pain in order to improve overall function and QOL    Time 8   Period Weeks   Status New   PT LONG TERM GOAL #3   Title Patient to be able to perform all selfcare tassk such as hairwashing, dressing, and bathing in order to regain independence and improve QOL    Time 8   Period Weeks   Status New   PT LONG TERM GOAL #4   Title Patient to report she has been able to sleep through the night without waking up secondary to pain in order to improve overall QOL    Time 8   Period Weeks   Status New               Plan - 07/19/15 1610    Clinical Impression Statement Reviewed goals, compliance and assured correct technique with with HEP and copy of eval given to pt.  Pt very guarded and reprts of increased pain through session.  Utilized MHP and gentle soft tissue techniques cervical musculature for increased relaxation.  Pt reports increased pain with massage though reports pain reduced to 8/10 with no more throbbing pain at end of session.  Pt encouraged to continue with cervical movements HEP to reduce guarding and stay hydrated to reudce risk of headaches following manual   Rehab Potential Good   PT Frequency 2x / week   PT Duration 8 weeks   PT Treatment/Interventions ADLs/Self Care Home Management;Cryotherapy;Biofeedback;Moist Heat;Functional mobility  training;Therapeutic activities;Therapeutic exercise;Balance training;Neuromuscular re-education;Patient/family education;Manual techniques;Passive range of motion;Taping   PT Next Visit Plan start with moist heat and gradually work into gentle exercise and manual, postural  training    PT Home Exercise Plan reviewed HEP, no additional exercises given this session.      Patient will benefit from skilled therapeutic intervention in order to improve the following deficits and impairments:  Hypomobility, Impaired sensation, Decreased activity tolerance, Decreased strength, Pain, Increased fascial restricitons, Decreased mobility, Increased muscle spasms, Decreased range of motion, Improper body mechanics, Postural dysfunction  Visit Diagnosis: Cervicalgia  Stiffness of left shoulder, not elsewhere classified  Stiffness of right shoulder, not elsewhere classified  Abnormal posture     Problem List Patient Active Problem List   Diagnosis Date Noted  . Colon cancer screening   . Encounter for screening colonoscopy 03/14/2015  . Insomnia 11/26/2014  . Varicose veins 11/26/2014  . Osteoarthritis of both knees 08/28/2014  . Sleep apnea 05/25/2014  . Depression 02/04/2014  . Skin abscess 12/01/2012  . Hidradenitis 08/03/2012  . Diabetes (HCC) 07/20/2012  . Folliculitis 07/20/2012  . Migraine 06/04/2008  . Essential hypertension 06/04/2008  . CONSTIPATION, CHRONIC 06/04/2008  . ARTHRITIS 06/04/2008  . ABDOMINAL PAIN 06/04/2008   Becky Saxasey Cockerham, LPTA; CBIS [phone]  Juel BurrowCockerham, [name] 07/19/2015, 1:39 PM  [facility] [address] [place], [place], [address] Phone: [phone]   Fax:  [phone]  Name: Ruby Colaatricia A Benbrook MRN: [id number] Date of Birth: October 11, 1965

## 2015-07-22 ENCOUNTER — Encounter: Payer: Self-pay | Admitting: Nurse Practitioner

## 2015-07-22 ENCOUNTER — Ambulatory Visit (INDEPENDENT_AMBULATORY_CARE_PROVIDER_SITE_OTHER): Payer: Medicare Other | Admitting: Nurse Practitioner

## 2015-07-22 VITALS — BP 132/82 | Temp 97.9°F | Ht 65.0 in | Wt 221.1 lb

## 2015-07-22 DIAGNOSIS — K219 Gastro-esophageal reflux disease without esophagitis: Secondary | ICD-10-CM

## 2015-07-22 DIAGNOSIS — R1310 Dysphagia, unspecified: Secondary | ICD-10-CM

## 2015-07-22 DIAGNOSIS — R946 Abnormal results of thyroid function studies: Secondary | ICD-10-CM

## 2015-07-22 DIAGNOSIS — B009 Herpesviral infection, unspecified: Secondary | ICD-10-CM

## 2015-07-22 MED ORDER — VALACYCLOVIR HCL 1 G PO TABS: ORAL_TABLET | ORAL | Status: DC

## 2015-07-22 MED ORDER — HYDROCODONE-ACETAMINOPHEN 10-325 MG PO TABS: 1.0000 | ORAL_TABLET | ORAL | Status: DC | PRN

## 2015-07-22 MED ORDER — RANITIDINE HCL 300 MG PO TABS: 300.0000 mg | ORAL_TABLET | Freq: Every day | ORAL | Status: DC

## 2015-07-23 ENCOUNTER — Ambulatory Visit (HOSPITAL_COMMUNITY): Payer: Medicare Other | Admitting: Physical Therapy

## 2015-07-23 DIAGNOSIS — M62830 Muscle spasm of back: Secondary | ICD-10-CM | POA: Diagnosis not present

## 2015-07-23 DIAGNOSIS — M6281 Muscle weakness (generalized): Secondary | ICD-10-CM

## 2015-07-23 DIAGNOSIS — M542 Cervicalgia: Secondary | ICD-10-CM | POA: Diagnosis not present

## 2015-07-23 DIAGNOSIS — M25611 Stiffness of right shoulder, not elsewhere classified: Secondary | ICD-10-CM | POA: Diagnosis not present

## 2015-07-23 DIAGNOSIS — M25612 Stiffness of left shoulder, not elsewhere classified: Secondary | ICD-10-CM | POA: Diagnosis not present

## 2015-07-23 DIAGNOSIS — M5416 Radiculopathy, lumbar region: Secondary | ICD-10-CM

## 2015-07-23 DIAGNOSIS — R293 Abnormal posture: Secondary | ICD-10-CM | POA: Diagnosis not present

## 2015-07-23 DIAGNOSIS — M5442 Lumbago with sciatica, left side: Secondary | ICD-10-CM | POA: Diagnosis not present

## 2015-07-23 LAB — TSH: TSH: 0.793 u[IU]/mL (ref 0.450–4.500)

## 2015-07-23 LAB — THYROID ANTIBODIES: Thyroperoxidase Ab SerPl-aCnc: 14 IU/mL (ref 0–34)
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
## 2015-07-23 NOTE — Patient Instructions (Signed)
Neural Glide: Lay on your back with one knee bent up.  With the other leg straight, pull your toes up towards you, and then point.   2 x 10 reps, twice per day   ELASTIC BAND - SUPINE HIP ABDUCTION  While lying on your back, slowly bring your leg out to the side. Keep  your knee straight the entire time.  Keep toes pointing up towards the ceiling. Stop if you are having increased pain or muscle spasms.    2 x 10 reps, twice per day.     PIRIFORMIS AND HIP STRETCH - SEATED  While sitting in a chair, cross your affected leg on top of the other as shown.   Next, gently lean forward until a stretch is felt along the crossed leg.  Both legs.  Hold for 30 seconds each.  Repeat 2x's on each leg, and perform 2x's per day.     Seated Transverse Abdominis / Diaphragmatic Breathing / Belly Breathing  In a seated position, place one hand on your chest and one hand on your belly.  As you inhale, imagine your belly filling and expanding like a balloon and push your stomach out slightly. The hand on your belly should move away from your body and the hand on your chest should not move.  As you exhale, imagine your balloon-belly deflating as you squeeze your abs to expel all the air. You should try and bring your belly button backwards toward your spine. Your hand on your belly will move back to the body and the hand on your chest should not move.   Take this exercise slow and breathe through the nose. This should be activating your core but not very exertive. Take breaks from this deep breathing technique when necessary and especially if you experience lightheadedness or dizziness.   Repeat 5-10 breaths, 2x's per day.

## 2015-07-24 ENCOUNTER — Encounter: Payer: Self-pay | Admitting: Nurse Practitioner

## 2015-07-24 DIAGNOSIS — K219 Gastro-esophageal reflux disease without esophagitis: Secondary | ICD-10-CM | POA: Insufficient documentation

## 2015-07-24 NOTE — Progress Notes (Signed)
Subjective:  Presents for c/o difficulty swallowing. Began about a month ago. Back of throat "feels dry". Sensation occurs each time she eats. No fever, no overt reflux symptoms. No CP or sore throat. Has a knot under the chin that began after developing a fever blister on the right side of the lip. Points to the upper neck as the area where she is having difficulty swallowing. Nonsmoker. No ETOH use.   Objective:   BP 132/82 mmHg  Temp(Src) 97.9 F (36.6 C) (Oral)  Ht 5\' 5"  (1.651 m)  Wt 221 lb 2 oz (100.302 kg)  BMI 36.80 kg/m2 NAD. Alert, oriented. TMs mild clear effusion, no erythema. Superficial open erythematous lesion noted right upper lip just beneath the nose. Pharynx clear. Neck supple with minimal adenopathy. Small tender submental lymph node noted. Thyroid slightly enlarged, mildly tender. No masses or goiter. Lungs clear. Heart RRR. Abdomen soft, nondistended with mild epigastric area tenderness.   Assessment:  Problem List Items Addressed This Visit      Digestive   Gastroesophageal reflux disease without esophagitis   Relevant Medications   ranitidine (ZANTAC) 300 MG tablet    Other Visit Diagnoses    Dysphagia    -  Primary    Abnormal thyroid exam        Relevant Orders    TSH (Completed)    Thyroid antibodies (Completed)    HSV-1 (herpes simplex virus 1) infection        Relevant Medications    valACYclovir (VALTREX) 1000 MG tablet      Plan:  Meds ordered this encounter  Medications  . ranitidine (ZANTAC) 300 MG tablet    Sig: Take 1 tablet (300 mg total) by mouth at bedtime.    Dispense:  30 tablet    Refill:  5    Order Specific Question:  Supervising Provider    Answer:  Merlyn AlbertLUKING, WILLIAM S [[id number]]  . valACYclovir (VALTREX) 1000 MG tablet    Sig: 2 po x 2 doses 12 hours apart prn fever blisters    Dispense:  4 tablet    Refill:  5    Order Specific Question:  Supervising Provider    Answer:  Merlyn AlbertLUKING, WILLIAM S [[id number]]  . HYDROcodone-acetaminophen (NORCO)  10-325 MG tablet    Sig: Take 1 tablet by mouth every 4 (four) hours as needed.    Dispense:  40 tablet    Refill:  0    Order Specific Question:  Supervising Provider    Answer:  Riccardo DubinLUKING, WILLIAM S [[id number]]   Refer to ENT specialist for evaluation. Call back in 2-3 weeks if abdominal pain has not resolved.  Return if symptoms worsen or fail to improve.

## 2015-07-25 ENCOUNTER — Encounter (HOSPITAL_COMMUNITY): Payer: Medicare Other | Admitting: Physical Therapy

## 2015-07-30 ENCOUNTER — Ambulatory Visit (HOSPITAL_COMMUNITY): Payer: Medicare Other

## 2015-07-30 DIAGNOSIS — M6281 Muscle weakness (generalized): Secondary | ICD-10-CM

## 2015-07-30 DIAGNOSIS — M62830 Muscle spasm of back: Secondary | ICD-10-CM

## 2015-07-30 DIAGNOSIS — R293 Abnormal posture: Secondary | ICD-10-CM

## 2015-07-30 DIAGNOSIS — M25611 Stiffness of right shoulder, not elsewhere classified: Secondary | ICD-10-CM | POA: Diagnosis not present

## 2015-07-30 DIAGNOSIS — M542 Cervicalgia: Secondary | ICD-10-CM

## 2015-07-30 DIAGNOSIS — M25612 Stiffness of left shoulder, not elsewhere classified: Secondary | ICD-10-CM

## 2015-07-30 DIAGNOSIS — M5442 Lumbago with sciatica, left side: Secondary | ICD-10-CM | POA: Diagnosis not present

## 2015-07-30 DIAGNOSIS — M5416 Radiculopathy, lumbar region: Secondary | ICD-10-CM | POA: Diagnosis not present

## 2015-07-30 NOTE — Therapy (Signed)
[facility] [address] [place], [place], [address] Phone: [phone]   Fax:  [phone]  Physical Therapy Treatment  Patient Details  Name: [name] MRN: [id number] Date of Birth: Nov 07, 1965 Referring Provider: Pati GalloJames Kramer  Encounter Date: 07/30/2015      PT End of Session - 07/30/15 0924    Visit Number 5  2nd visit for thoracic/lumbar pain, 5 total   Number of Visits 16   Date for PT Re-Evaluation 08/07/15   Authorization Type Medicare    Authorization Time Period 07/10/15 to 09/09/15   Authorization - Visit Number 5   Authorization - Number of Visits 10   PT Start Time 0903   PT Stop Time 0945   PT Time Calculation (min) 42 min   Equipment Utilized During Treatment --  MHP during session   Activity Tolerance Patient limited by pain   Behavior [facility] for tasks assessed/performed      Past Medical History  Diagnosis Date  . Hypertension   . Diabetes mellitus without complication (HCC)   . Migraine headache   . Allergy   . Sleep apnea   . Pulmonary nodule   . Hidradenitis suppurativa   . Folliculitis 07/20/2012  . Anxiety   . Arthritis     Past Surgical History  Procedure Laterality Date  . Abdominal hysterectomy    . Knee surgery Right 2012  . Endometrial ablation    . Pilonidal cyst excision  June 2011  . Foreign body removal N/A 05/26/2013    Procedure: FOREIGN BODY REMOVAL ADULT ABDOMINAL WALL;  Surgeon: Dalia HeadingMark A Jenkins, MD;  Location: [facility];  Service: General;  Laterality: N/A;  . Colonoscopy with propofol N/A 04/08/2015    Procedure: COLONOSCOPY WITH PROPOFOL;  Surgeon: Corbin Adeobert M Rourk, MD;  Location: [facility];  Service: Endoscopy;  Laterality: N/A;  0815 - moved to 9:00 - office to notify    There were no vitals filed for this visit.      Subjective Assessment - 07/30/15 0914    Subjective Pt stated her neck and shoulder Lt side as well as LBP pain scale 7-8/10.  Reports  difficulty sleeping last night due to pain.   Pertinent History DM and HTN    Patient Stated Goals Pt states that she wants to get better, and feel better, with less pain.   Currently in Pain? Yes   Pain Score 8    Pain Location Back  Bil neck, shoulder and LBP   Pain Descriptors / Indicators Sharp;Throbbing;Aching   Pain Type Acute pain   Pain Radiating Towards L LE - to mid calf   Pain Onset More than a month ago   Pain Frequency Constant   Aggravating Factors  walking   Pain Relieving Factors taking muscle relaxors, pain medication, and moist heat   Effect of Pain on Daily Activities difficulty sleeping. Pt has stated in the past that laying on her back seems to decrease her pain, however, not able to tolerate this position for more than 10 min during today's evaluation.                         [facility] Adult PT Treatment/Exercise - 07/30/15 0001    Neck Exercises: Supine   Other Supine Exercise PROM rotation per tolerance during manual   Other Supine Exercise diagraphmatic breaing (1 hand on chest, 1 on stomach) 20x during manual   Lumbar Exercises: Stretches  Single Knee to Chest Stretch 3 reps;20 seconds   Double Knee to Chest Stretch 2 reps;20 seconds   Lumbar Exercises: Seated   Other Seated Lumbar Exercises Diaphragmatic breathing in seated x 8 min cueing to improve deeper breathing  1 hand chest, 1 hand on abdomen   Lumbar Exercises: Supine   Ab Set 10 reps;5 seconds   Other Supine Lumbar Exercises hip abduction in supine 10x   Modalities   Modalities Moist Heat   Moist Heat Therapy   Number Minutes Moist Heat 20 Minutes   Moist Heat Location Cervical;Lumbar Spine  During supine therex; MHP on Lumbar during cervical manual   Manual Therapy   Manual Therapy Soft tissue mobilization   Manual therapy comments Manual complete separate rest of treatment   Soft tissue mobilization gentle STM upper traps, levator scapula   Passive ROM all directions with poor  tolerance, increased guarding with cueing for diaphragmatic breathing             PT Short Term Goals - 07/23/15 1146    PT SHORT TERM GOAL #1   Title Patient will demonstrate at least 25 degree improvement in cervical and shoulder ROM all planes in order to improve mechanics and reduce pain    Time 4   Period Weeks   Status New   PT SHORT TERM GOAL #2   Title Patient to experience no more than 5/10 pain in order to improve overall QOL    Time 4   Period Weeks   Status New   PT SHORT TERM GOAL #3   Title Patient to be able to maintain correct posture at least 70% of the time in order to assist in reducing pain and improving mechanics    Time 4   Period Weeks   Status New   PT SHORT TERM GOAL #4   Title Patient to be indpendent in correctly and consistently performing approipriate HEP, to be updated PRN    Time 4   Period Weeks   Status New   PT SHORT TERM GOAL #5   Title Pt will demonstrate increase in walking/standing tolerance from to 30 minutes to improve ability to perform community ambulation activities such as grocery shopping.    Time 4   Period Weeks   Status New   Additional Short Term Goals   Additional Short Term Goals Yes   PT SHORT TERM GOAL #6   Title Pt will demonstrate increase in B LE strength in all areas of deficit by 1/2 MMT grade to improve stabilization required for reduction of pain with functional mobility.    Time 4   Period Weeks   Status New           PT Long Term Goals - 07/23/15 1152    PT LONG TERM GOAL #1   Title Patient to demonstrate cervical and shoulder ROM as being WFL on all planes in order to improve function and QOL    Time 8   Period Weeks   Status New   PT LONG TERM GOAL #2   Title Patient to experience no more than 2/10 pain in order to improve overall function and QOL    Time 8   Period Weeks   Status New   PT LONG TERM GOAL #3   Title Patient to be able to perform all selfcare tassk such as hairwashing,  dressing, and bathing in order to regain independence and improve QOL    Time 8  Period Weeks   Status New   PT LONG TERM GOAL #4   Title Patient to report she has been able to sleep through the night without waking up secondary to pain in order to improve overall QOL    Time 8   Period Weeks   Status New   PT LONG TERM GOAL #5   Title Pt will improve lumbar and thoracic AROM to [facility] to reduce pain and improve functional mobility tasks.    Time 8   Period Weeks   Status New   Additional Long Term Goals   Additional Long Term Goals Yes   PT LONG TERM GOAL #6   Title Pt will express being able to tolerate standing or ambulating for 80min-60min to improve pt's community ambulation tolerance required for shopping and running errands.    Time 8   Period Weeks   Status New               Plan - 07/30/15 1116    Clinical Impression Statement Pt limited by pain with high muscle guarding and minimal cervical movements at entrance, hypersensitivite to touch.  Began session with MHP to cervical, shoulders and LBP to relax muscluature prior therex.  Reviewed goals with new evaluation complete last session for thoracic and lumbar back, assessed form and technique with HEP and copy of eval given to pt.  Session focus on improving diaphragmatic breathing techniques to assist wtih pain control, core strenghtening and gentle cervical movements to reduce muscle guarding.  Pt with moderate verbal and tactile cueing to improve belly breathening, used 1 hand on chest, 1 on stomach for instructions.  Ended session with gentle soft tissue mobilizaiton to reduce muscle guarding and tightness.  Pt stated pain reduced to 7/10.  Pt stated she has had headaches following treatment, encouraged to stay hydrated to reduce risk of headache following manual.     Rehab Potential Good   PT Frequency 2x / week   PT Duration 8 weeks   PT Treatment/Interventions ADLs/Self Care Home  Management;Cryotherapy;Biofeedback;Moist Heat;Functional mobility training;Therapeutic activities;Therapeutic exercise;Balance training;Neuromuscular re-education;Patient/family education;Manual techniques;Passive range of motion;Taping;Electrical Stimulation;Traction   PT Next Visit Plan Pt responds well to moist heat,  Try DKTC and SKTC, review piriformis stretch in seated.  Progress STM for additional reduction of muscle guarding.    PT Home Exercise Plan Reviewed new HEP: LE neural glides, supine hip abduction, seated piriformis stretch, and diaphragmatic breathing; no additional exercises given.      Patient will benefit from skilled therapeutic intervention in order to improve the following deficits and impairments:  Hypomobility, Impaired sensation, Decreased activity tolerance, Decreased strength, Pain, Increased fascial restricitons, Decreased mobility, Increased muscle spasms, Decreased range of motion, Improper body mechanics, Postural dysfunction  Visit Diagnosis: Radiculopathy, lumbar region  Bilateral low back pain with left-sided sciatica  Muscle spasm of back  Muscle weakness (generalized)  Abnormal posture  Cervicalgia  Stiffness of left shoulder, not elsewhere classified  Stiffness of right shoulder, not elsewhere classified     Problem List Patient Active Problem List   Diagnosis Date Noted  . Gastroesophageal reflux disease without esophagitis 07/24/2015  . Colon cancer screening   . Encounter for screening colonoscopy 03/14/2015  . Insomnia 11/26/2014  . Varicose veins 11/26/2014  . Osteoarthritis of both knees 08/28/2014  . Sleep apnea 05/25/2014  . Depression 02/04/2014  . Skin abscess 12/01/2012  . Hidradenitis 08/03/2012  . Diabetes (HCC) 07/20/2012  . Folliculitis 07/20/2012  . Migraine 06/04/2008  . Essential hypertension  06/04/2008  . CONSTIPATION, CHRONIC 06/04/2008  . ARTHRITIS 06/04/2008  . ABDOMINAL PAIN 06/04/2008   Becky Sax,  LPTA; CBIS [phone]  Juel Burrow 07/30/2015, 11:36 AM  [facility] [address] [place], [place], [address] Phone: [phone]   Fax:  [phone]  Name: RENLEE FLOOR MRN: [id number] Date of Birth: October 12, 1965

## 2015-07-31 ENCOUNTER — Ambulatory Visit (HOSPITAL_COMMUNITY): Payer: Medicare Other

## 2015-07-31 DIAGNOSIS — M5442 Lumbago with sciatica, left side: Secondary | ICD-10-CM | POA: Diagnosis not present

## 2015-07-31 DIAGNOSIS — M25612 Stiffness of left shoulder, not elsewhere classified: Secondary | ICD-10-CM

## 2015-07-31 DIAGNOSIS — R293 Abnormal posture: Secondary | ICD-10-CM | POA: Diagnosis not present

## 2015-07-31 DIAGNOSIS — M542 Cervicalgia: Secondary | ICD-10-CM

## 2015-07-31 DIAGNOSIS — M5416 Radiculopathy, lumbar region: Secondary | ICD-10-CM | POA: Diagnosis not present

## 2015-07-31 DIAGNOSIS — M6281 Muscle weakness (generalized): Secondary | ICD-10-CM | POA: Diagnosis not present

## 2015-07-31 DIAGNOSIS — M25611 Stiffness of right shoulder, not elsewhere classified: Secondary | ICD-10-CM | POA: Diagnosis not present

## 2015-07-31 DIAGNOSIS — M62830 Muscle spasm of back: Secondary | ICD-10-CM | POA: Diagnosis not present

## 2015-07-31 NOTE — Therapy (Signed)
[facility] [address] [place], [place], [address] Phone: [phone]   Fax:  [phone]  Physical Therapy Treatment  Patient Details  Name: [name] MRN: [id number] Date of Birth: [birth year] Referring Provider: Pati Gallo  Encounter Date: 07/31/2015      PT End of Session - 07/31/15 0918    Visit Number 6   Number of Visits 16   Date for PT Re-Evaluation 08/07/15   Authorization Type Medicare    Authorization Time Period 07/10/15 to 09/09/15   Authorization - Visit Number 6   Authorization - Number of Visits 10   PT Start Time 0904   PT Stop Time 0947   PT Time Calculation (min) 43 min   Equipment Utilized During Treatment --  MPH during session   Activity Tolerance Patient limited by pain   Behavior During Therapy [facility] for tasks assessed/performed      Past Medical History  Diagnosis Date  . Hypertension   . Diabetes mellitus without complication (HCC)   . Migraine headache   . Allergy   . Sleep apnea   . Pulmonary nodule   . Hidradenitis suppurativa   . Folliculitis 07/20/2012  . Anxiety   . Arthritis     Past Surgical History  Procedure Laterality Date  . Abdominal hysterectomy    . Knee surgery Right 2012  . Endometrial ablation    . Pilonidal cyst excision  June 2011  . Foreign body removal N/A 05/26/2013    Procedure: FOREIGN BODY REMOVAL ADULT ABDOMINAL WALL;  Surgeon: Dalia Heading, MD;  Location: [facility];  Service: General;  Laterality: N/A;  . Colonoscopy with propofol N/A 04/08/2015    Procedure: COLONOSCOPY WITH PROPOFOL;  Surgeon: Corbin Ade, MD;  [facility];  Service: Endoscopy;  Laterality: N/A;  0815 - moved to 9:00 - office to notify    There were no vitals filed for this visit.      Subjective Assessment - 07/31/15 0914    Subjective Pt stated she had difficulty sleeping last night due to pain and muscle spasms, pain scale 8/10 for neck, mid and lower back pain   Pertinent History DM and HTN    Patient Stated Goals Pt states that she wants to get better, and feel better, with less pain.   Currently in Pain? Yes   Pain Score 8    Pain Location Back  neck, thoracic and lumbar pain   Pain Orientation Upper;Mid;Lower   Pain Descriptors / Indicators Aching;Throbbing;Sharp  muscle spasms   Pain Type Acute pain   Pain Radiating Towards L LE - to mid calf   Pain Onset More than a month ago   Pain Frequency Constant   Aggravating Factors  walking   Pain Relieving Factors taking muscle relaxors, pain medication, and moist heat   Effect of Pain on Daily Activities difficulty sleeping. Pt has stated in the past that laying on her back seems to decrease her pain, however, not able to tolerate this position for more than 10 min during today's evaluation.              [facility] Adult PT Treatment/Exercise - 07/31/15 0001    Neck Exercises: Supine   Cervical Rotation 10 reps   Cervical Rotation Limitations gentle AROM per pt tolerance   Other Supine Exercise diagraphmatic breaing (1 hand on chest, 1 on stomach) 20x during manual   Lumbar Exercises: Stretches   Single Knee to Chest Stretch  3 reps;20 seconds   Single Knee to Chest Stretch Limitations passive due to reports of increased shoulder pain with [facility] with towel assistance   Double Knee to Chest Stretch 2 reps;20 seconds   Double Knee to Chest Stretch Limitations passive due to reports of increased shoulder pain with DKTC with towel assistance   Piriformis Stretch 2 reps;30 seconds  seated   Lumbar Exercises: Supine   Ab Set 10 reps;5 seconds   AB Set Limitations 2 sets   Glut Set 10 reps;5 seconds   Modalities   Modalities Moist Heat   Moist Heat Therapy   Number Minutes Moist Heat 20 Minutes   Moist Heat Location Cervical;Lumbar Spine   Manual Therapy   Manual Therapy Soft tissue mobilization   Manual therapy comments Manual complete separate rest of treatment   Soft tissue mobilization  gentle STM upper traps, levator scapula                  PT Short Term Goals - 07/23/15 1146    PT SHORT TERM GOAL #1   Title Patient will demonstrate at least 25 degree improvement in cervical and shoulder ROM all planes in order to improve mechanics and reduce pain    Time 4   Period Weeks   Status New   PT SHORT TERM GOAL #2   Title Patient to experience no more than 5/10 pain in order to improve overall QOL    Time 4   Period Weeks   Status New   PT SHORT TERM GOAL #3   Title Patient to be able to maintain correct posture at least 70% of the time in order to assist in reducing pain and improving mechanics    Time 4   Period Weeks   Status New   PT SHORT TERM GOAL #4   Title Patient to be indpendent in correctly and consistently performing approipriate HEP, to be updated PRN    Time 4   Period Weeks   Status New   PT SHORT TERM GOAL #5   Title Pt will demonstrate increase in walking/standing tolerance from to 30 minutes to improve ability to perform community ambulation activities such as grocery shopping.    Time 4   Period Weeks   Status New   Additional Short Term Goals   Additional Short Term Goals Yes   PT SHORT TERM GOAL #6   Title Pt will demonstrate increase in B LE strength in all areas of deficit by 1/2 MMT grade to improve stabilization required for reduction of pain with functional mobility.    Time 4   Period Weeks   Status New           PT Long Term Goals - 07/23/15 1152    PT LONG TERM GOAL #1   Title Patient to demonstrate cervical and shoulder ROM as being WFL on all planes in order to improve function and QOL    Time 8   Period Weeks   Status New   PT LONG TERM GOAL #2   Title Patient to experience no more than 2/10 pain in order to improve overall function and QOL    Time 8   Period Weeks   Status New   PT LONG TERM GOAL #3   Title Patient to be able to perform all selfcare tassk such as hairwashing, dressing, and bathing in  order to regain independence and improve QOL    Time 8   Period Weeks  Status New   PT LONG TERM GOAL #4   Title Patient to report she has been able to sleep through the night without waking up secondary to pain in order to improve overall QOL    Time 8   Period Weeks   Status New   PT LONG TERM GOAL #5   Title Pt will improve lumbar and thoracic AROM to [facility] to reduce pain and improve functional mobility tasks.    Time 8   Period Weeks   Status New   Additional Long Term Goals   Additional Long Term Goals Yes   PT LONG TERM GOAL #6   Title Pt will express being able to tolerate standing or ambulating for 10min-60min to improve pt's community ambulation tolerance required for shopping and running errands.    Time 8   Period Weeks   Status New               Plan - 07/31/15 1808    Clinical Impression Statement Pt continues to be limtied by pain with high muscle guarding and minimal cervical movements at entrance.  Session focus on pain control technqiues including MHP to cervical and lumbar region to relax musculature and manual technqiues to reduce muscle guarding and assist with reduced pain.  Pt improving tehcnqiues iwth diaphragmatiic breathing with less cueing required for technqiue as well as muscle guarding reduced through session.  Pt does continue to exhibit high muscle guarding and trigger point pain especially Lt upper trap.  Able to reduce with gentle muscle energy technqiues though unable to fully resolve.  Gentle core and glut strengthening therex complete with reports of reduced LBP at end of session.  Continued to encourage pt to stay hydrated to reduce risk of headaches following manual.  Pt able to demonstrate appropriate technqiue with seated piriformis stretch HEP with minimal cueing for form.     Rehab Potential Good   PT Frequency 2x / week   PT Duration 8 weeks   PT Treatment/Interventions ADLs/Self Care Home Management;Cryotherapy;Biofeedback;Moist  Heat;Functional mobility training;Therapeutic activities;Therapeutic exercise;Balance training;Neuromuscular re-education;Patient/family education;Manual techniques;Passive range of motion;Taping;Electrical Stimulation;Traction   PT Next Visit Plan Pt responds well to moist heat,  Try DKTC and SKTC.  Progress STM for additional reduction of muscle guarding.       Patient will benefit from skilled therapeutic intervention in order to improve the following deficits and impairments:  Hypomobility, Impaired sensation, Decreased activity tolerance, Decreased strength, Pain, Increased fascial restricitons, Decreased mobility, Increased muscle spasms, Decreased range of motion, Improper body mechanics, Postural dysfunction  Visit Diagnosis: Radiculopathy, lumbar region  Bilateral low back pain with left-sided sciatica  Muscle spasm of back  Muscle weakness (generalized)  Abnormal posture  Cervicalgia  Stiffness of left shoulder, not elsewhere classified  Stiffness of right shoulder, not elsewhere classified     Problem List Patient Active Problem List   Diagnosis Date Noted  . Gastroesophageal reflux disease without esophagitis 07/24/2015  . Colon cancer screening   . Encounter for screening colonoscopy 03/14/2015  . Insomnia 11/26/2014  . Varicose veins 11/26/2014  . Osteoarthritis of both knees 08/28/2014  . Sleep apnea 05/25/2014  . Depression 02/04/2014  . Skin abscess 12/01/2012  . Hidradenitis 08/03/2012  . Diabetes (HCC) 07/20/2012  . Folliculitis 07/20/2012  . Migraine 06/04/2008  . Essential hypertension 06/04/2008  . CONSTIPATION, CHRONIC 06/04/2008  . ARTHRITIS 06/04/2008  . ABDOMINAL PAIN 06/04/2008   Becky Sax, LPTA; CBIS [phone]  Juel Burrow 07/31/2015, 6:14 PM  [place]  [facility] [address] [place], [place], [address] Phone: [phone]   Fax:  [phone]  Name: [name] MRN:  [id number] Date of Birth: 06-11-1965

## 2015-08-06 ENCOUNTER — Ambulatory Visit (HOSPITAL_COMMUNITY): Payer: Medicare Other | Admitting: Physical Therapy

## 2015-08-06 DIAGNOSIS — R293 Abnormal posture: Secondary | ICD-10-CM | POA: Diagnosis not present

## 2015-08-06 DIAGNOSIS — M6281 Muscle weakness (generalized): Secondary | ICD-10-CM

## 2015-08-06 DIAGNOSIS — M5442 Lumbago with sciatica, left side: Secondary | ICD-10-CM | POA: Diagnosis not present

## 2015-08-06 DIAGNOSIS — M542 Cervicalgia: Secondary | ICD-10-CM | POA: Diagnosis not present

## 2015-08-06 DIAGNOSIS — M62830 Muscle spasm of back: Secondary | ICD-10-CM | POA: Diagnosis not present

## 2015-08-06 DIAGNOSIS — M5416 Radiculopathy, lumbar region: Secondary | ICD-10-CM

## 2015-08-06 DIAGNOSIS — M25611 Stiffness of right shoulder, not elsewhere classified: Secondary | ICD-10-CM | POA: Diagnosis not present

## 2015-08-06 DIAGNOSIS — M25612 Stiffness of left shoulder, not elsewhere classified: Secondary | ICD-10-CM | POA: Diagnosis not present

## 2015-08-06 NOTE — Therapy (Signed)
[place] [facility] [address] [place], [place], [address] Phone: [phone]   Fax:  [phone]  Physical Therapy Treatment  Patient Details  Name: [name] MRN: [id number] Date of Birth: 1965-06-08 Referring Provider: Pati GalloJames Kramer  Encounter Date: 08/06/2015      PT End of Session - 08/06/15 0947    Visit Number 7   Number of Visits 16   Date for PT Re-Evaluation 08/14/15   Authorization Type Medicare    Authorization Time Period 07/10/15 to 09/09/15   Authorization - Visit Number 7   Authorization - Number of Visits 10   PT Start Time 0902   PT Stop Time 0945   PT Time Calculation (min) 43 min   Activity Tolerance Patient tolerated treatment well;Patient limited by pain   Behavior During Therapy [facility] for tasks assessed/performed      Past Medical History  Diagnosis Date  . Hypertension   . Diabetes mellitus without complication (HCC)   . Migraine headache   . Allergy   . Sleep apnea   . Pulmonary nodule   . Hidradenitis suppurativa   . Folliculitis 07/20/2012  . Anxiety   . Arthritis     Past Surgical History  Procedure Laterality Date  . Abdominal hysterectomy    . Knee surgery Right 2012  . Endometrial ablation    . Pilonidal cyst excision  June 2011  . Foreign body removal N/A 05/26/2013    Procedure: FOREIGN BODY REMOVAL ADULT ABDOMINAL WALL;  Surgeon: Dalia HeadingMark A Jenkins, MD;  Location: [facility];  Service: General;  Laterality: N/A;  . Colonoscopy with propofol N/A 04/08/2015    Procedure: COLONOSCOPY WITH PROPOFOL;  Surgeon: Corbin Adeobert M Rourk, MD;  Location: [facility];  Service: Endoscopy;  Laterality: N/A;  0815 - moved to 9:00 - office to notify    There were no vitals filed for this visit.      Subjective Assessment - 08/06/15 0904    Subjective Patient reports that she is stilll having trouble sleeping due to pain; her pain is mostly all the way down her back, no major changes or close calls/falls    Pertinent History DM and HTN    Currently in Pain? Yes   Pain Score 7    Pain Location Back  cervical, lumbar, thoracic    Pain Orientation Mid;Upper;Lower   Pain Descriptors / Indicators Aching;Throbbing   Pain Type Acute pain   Pain Radiating Towards L LE down to mid calf    Pain Onset More than a month ago   Pain Frequency Constant   Aggravating Factors  not sure    Pain Relieving Factors muscle relaxers, pain meds, moist heat    Effect of Pain on Daily Activities difficulty sleeping                         [facility] Adult PT Treatment/Exercise - 08/06/15 0001    Neck Exercises: Seated   Other Seated Exercise 3D cervical and thoracic excursions 1x10   Lumbar Exercises: Stretches   Single Knee to Chest Stretch 5 reps;10 seconds   Lower Trunk Rotation 5 reps;10 seconds   Piriformis Stretch 2 reps;30 seconds   Lumbar Exercises: Standing   Other Standing Lumbar Exercises 3D hip excursions 1x10   Lumbar Exercises: Supine   Ab Set 5 seconds;15 reps   Bridge --   Bridge Limitations attempted however exacerbation of pain into L hip, performed glut sets 1x15 instead  Other Supine Lumbar Exercises --   Manual Therapy   Manual Therapy Joint mobilization   Manual therapy comments Manual complete separate rest of treatment   Joint Mobilization trial of grade 1-2 PA moves lumbar and thoracic spine                 PT Education - 08/06/15 0947    Education provided No          PT Short Term Goals - 07/23/15 1146    PT SHORT TERM GOAL #1   Title Patient will demonstrate at least 25 degree improvement in cervical and shoulder ROM all planes in order to improve mechanics and reduce pain    Time 4   Period Weeks   Status New   PT SHORT TERM GOAL #2   Title Patient to experience no more than 5/10 pain in order to improve overall QOL    Time 4   Period Weeks   Status New   PT SHORT TERM GOAL #3   Title Patient to be able to maintain correct posture at least  70% of the time in order to assist in reducing pain and improving mechanics    Time 4   Period Weeks   Status New   PT SHORT TERM GOAL #4   Title Patient to be indpendent in correctly and consistently performing approipriate HEP, to be updated PRN    Time 4   Period Weeks   Status New   PT SHORT TERM GOAL #5   Title Pt will demonstrate increase in walking/standing tolerance from to 30 minutes to improve ability to perform community ambulation activities such as grocery shopping.    Time 4   Period Weeks   Status New   Additional Short Term Goals   Additional Short Term Goals Yes   PT SHORT TERM GOAL #6   Title Pt will demonstrate increase in B LE strength in all areas of deficit by 1/2 MMT grade to improve stabilization required for reduction of pain with functional mobility.    Time 4   Period Weeks   Status New           PT Long Term Goals - 07/23/15 1152    PT LONG TERM GOAL #1   Title Patient to demonstrate cervical and shoulder ROM as being WFL on all planes in order to improve function and QOL    Time 8   Period Weeks   Status New   PT LONG TERM GOAL #2   Title Patient to experience no more than 2/10 pain in order to improve overall function and QOL    Time 8   Period Weeks   Status New   PT LONG TERM GOAL #3   Title Patient to be able to perform all selfcare tassk such as hairwashing, dressing, and bathing in order to regain independence and improve QOL    Time 8   Period Weeks   Status New   PT LONG TERM GOAL #4   Title Patient to report she has been able to sleep through the night without waking up secondary to pain in order to improve overall QOL    Time 8   Period Weeks   Status New   PT LONG TERM GOAL #5   Title Pt will improve lumbar and thoracic AROM to [facility] to reduce pain and improve functional mobility tasks.    Time 8   Period Weeks   Status New   Additional  Long Term Goals   Additional Long Term Goals Yes   PT LONG TERM GOAL #6   Title Pt  will express being able to tolerate standing or ambulating for 3645min-60min to improve pt's community ambulation tolerance required for shopping and running errands.    Time 8   Period Weeks   Status New               Plan - 08/06/15 0949    Clinical Impression Statement Performed mobility exercises for cervical, thoracic, and lumbar spines, noting reduced ROM in thoracic spine today in particular on top of reduction in kyphosis of T-spine posturally today as well. Introduced 3D hip excursion in standing as well to address mobility of pelvis and lumbar spine in CKC position. Otherwise continued with functional exercises and postural training today within pain limitations. Noted reduced mobility with AP hip excursions as well as increased pain, so this activity was terminated; however patient able to complete lateral and rotation based excursions. Did note increased pain going into L hip with L lumbar rotation in supine, indicating possible pressure on tissues by spinal structures. Reviewed imaging reports from 06/17/15 (most recent available through [facility] at this time) for contraindications and then trialed grade 1 mobilization of spine in prone as well today with poor tolerance of technique by patient.    Rehab Potential Good   PT Frequency 2x / week   PT Duration 8 weeks   PT Treatment/Interventions ADLs/Self Care Home Management;Cryotherapy;Biofeedback;Moist Heat;Functional mobility training;Therapeutic activities;Therapeutic exercise;Balance training;Neuromuscular re-education;Patient/family education;Manual techniques;Passive range of motion;Taping;Electrical Stimulation;Traction   PT Next Visit Plan Pt responds well to moist heat,  Try DKTC and SKTC.  Progress STM for additional reduction of muscle guarding.    PT Home Exercise Plan Reviewed new HEP: LE neural glides, supine hip abduction, seated piriformis stretch, and diaphragmatic breathing; no additional exercises given.   Consulted and  Agree with Plan of Care Patient      Patient will benefit from skilled therapeutic intervention in order to improve the following deficits and impairments:  Hypomobility, Impaired sensation, Decreased activity tolerance, Decreased strength, Pain, Increased fascial restricitons, Decreased mobility, Increased muscle spasms, Decreased range of motion, Improper body mechanics, Postural dysfunction  Visit Diagnosis: Radiculopathy, lumbar region  Bilateral low back pain with left-sided sciatica  Muscle spasm of back  Muscle weakness (generalized)  Abnormal posture     Problem List Patient Active Problem List   Diagnosis Date Noted  . Gastroesophageal reflux disease without esophagitis 07/24/2015  . Colon cancer screening   . Encounter for screening colonoscopy 03/14/2015  . Insomnia 11/26/2014  . Varicose veins 11/26/2014  . Osteoarthritis of both knees 08/28/2014  . Sleep apnea 05/25/2014  . Depression 02/04/2014  . Skin abscess 12/01/2012  . Hidradenitis 08/03/2012  . Diabetes (HCC) 07/20/2012  . Folliculitis 07/20/2012  . Migraine 06/04/2008  . Essential hypertension 06/04/2008  . CONSTIPATION, CHRONIC 06/04/2008  . ARTHRITIS 06/04/2008  . ABDOMINAL PAIN 06/04/2008    Nedra HaiKristen Maurice Ramseur PT, DPT [phone]  [facility] [address] [place], [place], [address] Phone: [phone]   Fax:  [phone]  Name: [name] MRN: [id number] Date of Birth: 30-Nov-1965

## 2015-08-08 ENCOUNTER — Ambulatory Visit (HOSPITAL_COMMUNITY): Payer: Medicare Other | Admitting: Physical Therapy

## 2015-08-08 DIAGNOSIS — M25612 Stiffness of left shoulder, not elsewhere classified: Secondary | ICD-10-CM | POA: Diagnosis not present

## 2015-08-08 DIAGNOSIS — M6281 Muscle weakness (generalized): Secondary | ICD-10-CM

## 2015-08-08 DIAGNOSIS — M542 Cervicalgia: Secondary | ICD-10-CM

## 2015-08-08 DIAGNOSIS — R293 Abnormal posture: Secondary | ICD-10-CM

## 2015-08-08 DIAGNOSIS — M62830 Muscle spasm of back: Secondary | ICD-10-CM

## 2015-08-08 DIAGNOSIS — M5442 Lumbago with sciatica, left side: Secondary | ICD-10-CM | POA: Diagnosis not present

## 2015-08-08 DIAGNOSIS — M5416 Radiculopathy, lumbar region: Secondary | ICD-10-CM

## 2015-08-08 DIAGNOSIS — M25611 Stiffness of right shoulder, not elsewhere classified: Secondary | ICD-10-CM | POA: Diagnosis not present

## 2015-08-08 NOTE — Therapy (Signed)
[facility] [address] [place], [place], [address] Phone: [phone]   Fax:  [phone]  Physical Therapy Treatment  Patient Details  Name: [name] MRN: [id number] Date of Birth: 04-13-1965 Referring Provider: Pati GalloJames Kramer  Encounter Date: 08/08/2015      PT End of Session - 08/08/15 0949    Visit Number 8   Number of Visits 16   Date for PT Re-Evaluation 08/14/15   Authorization Type Medicare    Authorization Time Period 07/10/15 to 09/09/15   Authorization - Visit Number 8   Authorization - Number of Visits 10   PT Start Time 0913  started on moist heat, unbilled    PT Stop Time 0945   PT Time Calculation (min) 32 min   Activity Tolerance Patient tolerated treatment well;Patient limited by pain   Behavior During Therapy [facility] for tasks assessed/performed      Past Medical History  Diagnosis Date  . Hypertension   . Diabetes mellitus without complication (HCC)   . Migraine headache   . Allergy   . Sleep apnea   . Pulmonary nodule   . Hidradenitis suppurativa   . Folliculitis 07/20/2012  . Anxiety   . Arthritis     Past Surgical History  Procedure Laterality Date  . Abdominal hysterectomy    . Knee surgery Right 2012  . Endometrial ablation    . Pilonidal cyst excision  June 2011  . Foreign body removal N/A 05/26/2013    Procedure: FOREIGN BODY REMOVAL ADULT ABDOMINAL WALL;  Surgeon: Dalia HeadingMark A Jenkins, MD;  Location: [facility];  Service: General;  Laterality: N/A;  . Colonoscopy with propofol N/A 04/08/2015    Procedure: COLONOSCOPY WITH PROPOFOL;  Surgeon: Corbin Adeobert M Rourk, MD;  [facility];  Service: Endoscopy;  Laterality: N/A;  0815 - moved to 9:00 - office to notify    There were no vitals filed for this visit.      Subjective Assessment - 08/08/15 0907    Subjective Patient reports she had a HA constantly after last session; she has to make an appt with Dr. Farris HasKramer soon.    Pertinent History  DM and HTN    Currently in Pain? Yes   Pain Score 7   lower and upper back    Pain Location Back   Pain Orientation Mid;Upper;Lower                         [facility] Adult PT Treatment/Exercise - 08/08/15 0001    Neck Exercises: Seated   Other Seated Exercise 3D cervical and thoracic excursions 1x10   Lumbar Exercises: Standing   Other Standing Lumbar Exercises 3D hip excursions 1x10   Lumbar Exercises: Supine   Ab Set 5 seconds;15 reps   Glut Set 15 reps   Other Supine Lumbar Exercises --  manual resistance    Moist Heat Therapy   Number Minutes Moist Heat 10 Minutes   Moist Heat Location Other (comment)  cervical, lumbar, thoracic in supine    Manual Therapy   Manual Therapy Soft tissue mobilization   Manual therapy comments Manual complete separate rest of treatment   Soft tissue mobilization gentle STM upper traps, levator scapula                PT Education - 08/08/15 0949    Education provided Yes   Education Details importance of motion in improving muscle guarding and reducing pain  Person(s) Educated Patient   Methods Explanation   Comprehension Verbalized understanding          PT Short Term Goals - 07/23/15 1146    PT SHORT TERM GOAL #1   Title Patient will demonstrate at least 25 degree improvement in cervical and shoulder ROM all planes in order to improve mechanics and reduce pain    Time 4   Period Weeks   Status New   PT SHORT TERM GOAL #2   Title Patient to experience no more than 5/10 pain in order to improve overall QOL    Time 4   Period Weeks   Status New   PT SHORT TERM GOAL #3   Title Patient to be able to maintain correct posture at least 70% of the time in order to assist in reducing pain and improving mechanics    Time 4   Period Weeks   Status New   PT SHORT TERM GOAL #4   Title Patient to be indpendent in correctly and consistently performing approipriate HEP, to be updated PRN    Time 4   Period Weeks    Status New   PT SHORT TERM GOAL #5   Title Pt will demonstrate increase in walking/standing tolerance from to 30 minutes to improve ability to perform community ambulation activities such as grocery shopping.    Time 4   Period Weeks   Status New   Additional Short Term Goals   Additional Short Term Goals Yes   PT SHORT TERM GOAL #6   Title Pt will demonstrate increase in B LE strength in all areas of deficit by 1/2 MMT grade to improve stabilization required for reduction of pain with functional mobility.    Time 4   Period Weeks   Status New           PT Long Term Goals - 07/23/15 1152    PT LONG TERM GOAL #1   Title Patient to demonstrate cervical and shoulder ROM as being WFL on all planes in order to improve function and QOL    Time 8   Period Weeks   Status New   PT LONG TERM GOAL #2   Title Patient to experience no more than 2/10 pain in order to improve overall function and QOL    Time 8   Period Weeks   Status New   PT LONG TERM GOAL #3   Title Patient to be able to perform all selfcare tassk such as hairwashing, dressing, and bathing in order to regain independence and improve QOL    Time 8   Period Weeks   Status New   PT LONG TERM GOAL #4   Title Patient to report she has been able to sleep through the night without waking up secondary to pain in order to improve overall QOL    Time 8   Period Weeks   Status New   PT LONG TERM GOAL #5   Title Pt will improve lumbar and thoracic AROM to [facility] to reduce pain and improve functional mobility tasks.    Time 8   Period Weeks   Status New   Additional Long Term Goals   Additional Long Term Goals Yes   PT LONG TERM GOAL #6   Title Pt will express being able to tolerate standing or ambulating for 9min-60min to improve pt's community ambulation tolerance required for shopping and running errands.    Time 8   Period Weeks  Status New               Plan - 08/08/15 0950    Clinical Impression  Statement Began session with 10 minutes on moist heat (not included in billing), then progressed to mobility and posture based exercises today with cues for form. Patient continues to demonstrate quite a bit of muscle guarding which is still significantly limiting her motion at this time and resulting in rigid, stiff gait pattern. Improved form with excursions today. Ended session with manual to cervical region for pain relief and posture improvement, however she does remain very hypersensitive to touch in this area, unable to apply therapeutic pressure during massage today.    Rehab Potential Good   PT Frequency 2x / week   PT Duration 8 weeks   PT Treatment/Interventions ADLs/Self Care Home Management;Cryotherapy;Biofeedback;Moist Heat;Functional mobility training;Therapeutic activities;Therapeutic exercise;Balance training;Neuromuscular re-education;Patient/family education;Manual techniques;Passive range of motion;Taping;Electrical Stimulation;Traction   PT Next Visit Plan Pt responds well to moist heat,  Try DKTC and SKTC.  Progress STM for additional reduction of muscle guarding.    Consulted and Agree with Plan of Care Patient      Patient will benefit from skilled therapeutic intervention in order to improve the following deficits and impairments:  Hypomobility, Impaired sensation, Decreased activity tolerance, Decreased strength, Pain, Increased fascial restricitons, Decreased mobility, Increased muscle spasms, Decreased range of motion, Improper body mechanics, Postural dysfunction  Visit Diagnosis: Radiculopathy, lumbar region  Bilateral low back pain with left-sided sciatica  Muscle spasm of back  Muscle weakness (generalized)  Abnormal posture  Cervicalgia     Problem List Patient Active Problem List   Diagnosis Date Noted  . Gastroesophageal reflux disease without esophagitis 07/24/2015  . Colon cancer screening   . Encounter for screening colonoscopy 03/14/2015  .  Insomnia 11/26/2014  . Varicose veins 11/26/2014  . Osteoarthritis of both knees 08/28/2014  . Sleep apnea 05/25/2014  . Depression 02/04/2014  . Skin abscess 12/01/2012  . Hidradenitis 08/03/2012  . Diabetes (HCC) 07/20/2012  . Folliculitis 07/20/2012  . Migraine 06/04/2008  . Essential hypertension 06/04/2008  . CONSTIPATION, CHRONIC 06/04/2008  . ARTHRITIS 06/04/2008  . ABDOMINAL PAIN 06/04/2008    Nedra HaiKristen Unger PT, DPT [phone]  [facility] [address] [place], [place], [address] Phone: [phone]   Fax:  [phone]  Name: [name] MRN: [id number] Date of Birth: 17-May-1965

## 2015-08-14 ENCOUNTER — Ambulatory Visit (HOSPITAL_COMMUNITY): Payer: Medicare Other | Attending: Sports Medicine | Admitting: Physical Therapy

## 2015-08-14 DIAGNOSIS — M5416 Radiculopathy, lumbar region: Secondary | ICD-10-CM | POA: Diagnosis not present

## 2015-08-14 DIAGNOSIS — M62830 Muscle spasm of back: Secondary | ICD-10-CM | POA: Diagnosis not present

## 2015-08-14 DIAGNOSIS — M25612 Stiffness of left shoulder, not elsewhere classified: Secondary | ICD-10-CM

## 2015-08-14 DIAGNOSIS — R293 Abnormal posture: Secondary | ICD-10-CM

## 2015-08-14 DIAGNOSIS — M25611 Stiffness of right shoulder, not elsewhere classified: Secondary | ICD-10-CM

## 2015-08-14 DIAGNOSIS — M5442 Lumbago with sciatica, left side: Secondary | ICD-10-CM

## 2015-08-14 DIAGNOSIS — M6281 Muscle weakness (generalized): Secondary | ICD-10-CM

## 2015-08-14 DIAGNOSIS — M542 Cervicalgia: Secondary | ICD-10-CM | POA: Diagnosis not present

## 2015-08-14 NOTE — Therapy (Signed)
[place], [place], [address] Phone: [phone]   Fax:  [phone]  Physical Therapy Treatment (Re-Assessment)  Patient Details  Name: [name] MRN: [id number] Date of Birth: 23-Sep-1965 Referring Provider: Berle Mull  Encounter Date: 08/14/2015      PT End of Session - 08/14/15 0949    Visit Number 9   Number of Visits 17   Date for PT Re-Evaluation 09/04/15   Authorization Type Medicare (G-code done 9th session)   Authorization Time Period 07/10/15 to 6/96/29; new cert covers out ot 8/8   Authorization - Visit Number 9   Authorization - Number of Visits 19   PT Start Time 0901   PT Stop Time 0943   PT Time Calculation (min) 42 min   Activity Tolerance Patient tolerated treatment well;Patient limited by pain   Behavior During Therapy [facility] for tasks assessed/performed      Past Medical History  Diagnosis Date  . Hypertension   . Diabetes mellitus without complication ([place])   . Migraine headache   . Allergy   . Sleep apnea   . Pulmonary nodule   . Hidradenitis suppurativa   . Folliculitis 07/07/4130  . Anxiety   . Arthritis     Past Surgical History  Procedure Laterality Date  . Abdominal hysterectomy    . Knee surgery Right 2012  . Endometrial ablation    . Pilonidal cyst excision  June 2011  . Foreign body removal N/A 05/26/2013    Procedure: FOREIGN BODY REMOVAL ADULT ABDOMINAL WALL;  Surgeon: Jamesetta So, MD;  Location: [facility];  Service: General;  Laterality: N/A;  . Colonoscopy with propofol N/A 04/08/2015    Procedure: COLONOSCOPY WITH PROPOFOL;  Surgeon: Daneil Dolin, MD;  Location: [facility];  Service: Endoscopy;  Laterality: N/A;  0815 - moved to 9:00 - office to notify    There were no vitals filed for this visit.      Subjective Assessment - 08/14/15 0902    Subjective Patient reports that it is a rough day; her neck and shoulders, and back and legs are all  bothering her with numbness, tingling, throbbing pains. After last session she was a little sore but mostly OK. She thinks that she may have set off her pain by trying to do some extra exercises over the weekend. No falls or close calls since last time she was seen here. She has not been getting much sleep.  She feels like she is about the same progress wise, and subjectively rates herself at 50/100.    Pertinent History DM and HTN    How long can you sit comfortably? 7/5- 10 minutes    How long can you stand comfortably? 7/5- 10 minutes    How long can you walk comfortably? 7/5- 15 minutes    Patient Stated Goals Pt states that she wants to get better, and feel better, with less pain.   Currently in Pain? Yes   Pain Score 8    Pain Location Other (Comment)  all over    Pain Orientation Other (Comment)  all over    Pain Descriptors / Indicators Throbbing;Numbness;Aching   Pain Type Acute pain   Pain Radiating Towards L LE down to mid calf    Pain Onset More than a month ago   Pain Frequency Constant   Aggravating Factors  movement    Pain Relieving Factors comfortable position    Effect of Pain on  Daily Activities cannot play with grand kids, cannot go out into community well for PLOF based tasks             [facility] PT Assessment - 08/14/15 0001    Observation/Other Assessments   Observations ongoing hypersensivitiy to pain; (+) FABER bilateral hips, (+) scour R hip, unable to get accurate result for scour L hip due to pain in testing postion. No change in pain with hip joint axial distractions.    AROM   Cervical Flexion 20   Cervical Extension 10   Cervical - Right Side Bend 18   Cervical - Left Side Bend 15   Cervical - Right Rotation 18   Cervical - Left Rotation 9   Lumbar Flexion approximate superior edge of patellas    Lumbar Extension severe limitation    Lumbar - Right Side Bend 1" above knee    Lumbar - Left Side Bend 2" above knee    Strength   Right Hip Flexion 4+/5    Right Hip Extension 3-/5   Right Hip ABduction 3-/5   Left Hip Flexion 4/5   Left Hip Extension 3-/5   Left Hip ABduction 2+/5   Right Knee Flexion 3/5   Right Knee Extension 4/5   Left Knee Flexion 3-/5   Left Knee Extension 4-/5   Right Ankle Dorsiflexion 3/5   Left Ankle Dorsiflexion 3/5   Flexibility   Hamstrings moderate limitation    Piriformis moderate limitation    Ambulation/Gait   Gait Comments very rigid posture, flexed at hips, reduced step lenghts, supination R>L, proxmial muscle weakness, possible L hip shift    6 minute walk test results    Aerobic Endurance Distance Walked 226   Endurance additional comments 3MWT                      [facility] Adult PT Treatment/Exercise - 08/14/15 0001    Moist Heat Therapy   Number Minutes Moist Heat 10 Minutes   Moist Heat Location Other (comment)  done while education on progress with PT, POC were provided                 PT Education - 08/14/15 0949    Education provided Yes   Education Details progress with skilled PT services, POC    Person(s) Educated Patient   Methods Explanation   Comprehension Verbalized understanding          PT Short Term Goals - 08/14/15 0932    PT SHORT TERM GOAL #1   Title Patient will demonstrate at least 25 degree improvement in cervical and shoulder ROM all planes in order to improve mechanics and reduce pain    Time 4   Period Weeks   Status On-going   PT SHORT TERM GOAL #2   Title Patient to experience no more than 5/10 pain in order to improve overall QOL    Baseline 7/5- walked in with an 8/10 today; on average has been a 7/10   Time 4   Period Weeks   Status On-going   PT SHORT TERM GOAL #3   Title Patient to be able to maintain correct posture at least 70% of the time in order to assist in reducing pain and improving mechanics    Time 4   Period Weeks   Status On-going   PT SHORT TERM GOAL #4   Title Patient to be indpendent in correctly and  consistently performing approipriate HEP, to be updated  PRN    Baseline 7/5- reports compliance    Time 4   Period Weeks   Status Achieved   PT SHORT TERM GOAL #5   Title Pt will demonstrate increase in walking/standing tolerance from 59mn to 30 minutes to improve ability to perform community ambulation activities such as grocery shopping.    Time 4   Period Weeks   Status On-going   PT SHORT TERM GOAL #6   Title Pt will demonstrate increase in B LE strength in all areas of deficit by 1/2 MMT grade to improve stabilization required for reduction of pain with functional mobility.    Time 4   Period Weeks   Status On-going           PT Long Term Goals - 08/14/15 0934    PT LONG TERM GOAL #1   Title Patient to demonstrate cervical and shoulder ROM as being WFL on all planes in order to improve function and QOL    Time 8   Period Weeks   Status On-going   PT LONG TERM GOAL #2   Title Patient to experience no more than 2/10 pain in order to improve overall function and QOL    Time 8   Period Weeks   Status On-going   PT LONG TERM GOAL #3   Title Patient to be able to perform all selfcare tassk such as hairwashing, dressing, and bathing in order to regain independence and improve QOL    Baseline 7/5- cannot do hair by herself yet    Time 8   Period Weeks   Status Partially Met   PT LONG TERM GOAL #4   Title Patient to report she has been able to sleep through the night without waking up secondary to pain in order to improve overall QOL    Time 8   Period Weeks   Status On-going   PT LONG TERM GOAL #5   Title Pt will improve lumbar and thoracic [facility]OM to [facility] pain and improve functional mobility tasks.    Time 8   Period Weeks   Status On-going   PT LONG TERM GOAL #6   Title Pt will express being able to tolerate standing or ambulating for 412m-60min to improve pt's community ambulation tolerance required for shopping and running errands.    Time 8   Period Weeks    Status On-going               Plan - 08/14/15 0941    Clinical Impression Statement Re-assessment performed today. Patient continues to be extremely limited by pain, and continues to present with quite a bit of muscle guarding and rigidity that limits her motion as well. Upon examination, patient reveals ongoing limited ROM, ongoing functional muscle weakness, poor posture, reduced gait speed and reduced gait tolerance, and limited ability to return to PLOF based activities. At this time recommend continuation of skilled PT services in order to attempt to address skilled PT services, with some change in approach towards decompression and postural training along with general mobility.    Rehab Potential Good   PT Frequency 2x / week   PT Duration 4 weeks   PT Treatment/Interventions ADLs/Self Care Home Management;Cryotherapy;Biofeedback;Moist Heat;Functional mobility training;Therapeutic activities;Therapeutic exercise;Balance training;Neuromuscular re-education;Patient/family education;Manual techniques;Passive range of motion;Taping;Electrical Stimulation;Traction   PT Next Visit Plan moist heat, decompression exercises and extension exercises in supine. STM. Functional stretching. Suggest pool exercise to patient.    Consulted and Agree with Plan of Care  Patient      Patient will benefit from skilled therapeutic intervention in order to improve the following deficits and impairments:  Hypomobility, Impaired sensation, Decreased activity tolerance, Decreased strength, Pain, Increased fascial restricitons, Decreased mobility, Increased muscle spasms, Decreased range of motion, Improper body mechanics, Postural dysfunction  Visit Diagnosis: Radiculopathy, lumbar region  Bilateral low back pain with left-sided sciatica  Muscle spasm of back  Muscle weakness (generalized)  Abnormal posture  Cervicalgia  Stiffness of left shoulder, not elsewhere classified  Stiffness of right  shoulder, not elsewhere classified       G-Codes - 09-10-2015 0954    Functional Assessment Tool Used Based on skilled clnical assessment of ROM, strength, posture, pain   Functional Limitation Other PT primary   Other PT Primary Current Status (Y8933) At least 60 percent but less than 80 percent impaired, limited or restricted   Other PT Primary Goal Status (Q8266) At least 40 percent but less than 60 percent impaired, limited or restricted      Problem List Patient Active Problem List   Diagnosis Date Noted  . Gastroesophageal reflux disease without esophagitis 07/24/2015  . Colon cancer screening   . Encounter for screening colonoscopy 03/14/2015  . Insomnia 11/26/2014  . Varicose veins 11/26/2014  . Osteoarthritis of both knees 08/28/2014  . Sleep apnea 05/25/2014  . Depression 02/04/2014  . Skin abscess 12/01/2012  . Hidradenitis 08/03/2012  . Diabetes ([employer]) 07/20/2012  . Folliculitis 66/48/6161  . Migraine 06/04/2008  . Essential hypertension 06/04/2008  . CONSTIPATION, CHRONIC 06/04/2008  . ARTHRITIS 06/04/2008  . ABDOMINAL PAIN 06/04/2008    Deniece Ree PT, DPT [place] [address] [place], [place], [address] Phone: [phone]   Fax:  [phone]  Name: MALI EPPARD MRN: [id number] Date of Birth: [birth year]

## 2015-08-16 ENCOUNTER — Ambulatory Visit (HOSPITAL_COMMUNITY): Payer: Medicare Other

## 2015-08-16 DIAGNOSIS — R293 Abnormal posture: Secondary | ICD-10-CM

## 2015-08-16 DIAGNOSIS — M542 Cervicalgia: Secondary | ICD-10-CM

## 2015-08-16 DIAGNOSIS — M5442 Lumbago with sciatica, left side: Secondary | ICD-10-CM

## 2015-08-16 DIAGNOSIS — M5416 Radiculopathy, lumbar region: Secondary | ICD-10-CM

## 2015-08-16 DIAGNOSIS — M25612 Stiffness of left shoulder, not elsewhere classified: Secondary | ICD-10-CM | POA: Diagnosis not present

## 2015-08-16 DIAGNOSIS — M62830 Muscle spasm of back: Secondary | ICD-10-CM | POA: Diagnosis not present

## 2015-08-16 DIAGNOSIS — M25611 Stiffness of right shoulder, not elsewhere classified: Secondary | ICD-10-CM | POA: Diagnosis not present

## 2015-08-16 DIAGNOSIS — M6281 Muscle weakness (generalized): Secondary | ICD-10-CM

## 2015-08-16 NOTE — Therapy (Signed)
[place], [place], [address] Phone: [phone]   Fax:  [phone]  Physical Therapy Treatment  Patient Details  Name: [name] MRN: [id number] Date of Birth: 1965-05-10 Referring Provider: Berle Mull  Encounter Date: 08/16/2015      PT End of Session - 08/16/15 0914    Visit Number 10   Number of Visits 17   Date for PT Re-Evaluation 09/04/15   Authorization Type Medicare (G-code done 9th session)   Authorization Time Period 07/10/15 to 6/37/85; new cert covers out ot 8/8   Authorization - Visit Number 10   Authorization - Number of Visits 19   PT Start Time 0904   PT Stop Time 0949   PT Time Calculation (min) 45 min   Equipment Utilized During Treatment --  MPH during session   Activity Tolerance Patient tolerated treatment well;Patient limited by pain   Behavior During Therapy [facility] for tasks assessed/performed      Past Medical History  Diagnosis Date  . Hypertension   . Diabetes mellitus without complication ([place])   . Migraine headache   . Allergy   . Sleep apnea   . Pulmonary nodule   . Hidradenitis suppurativa   . Folliculitis 8/85/0277  . Anxiety   . Arthritis     Past Surgical History  Procedure Laterality Date  . Abdominal hysterectomy    . Knee surgery Right 2012  . Endometrial ablation    . Pilonidal cyst excision  June 2011  . Foreign body removal N/A 05/26/2013    Procedure: FOREIGN BODY REMOVAL ADULT ABDOMINAL WALL;  Surgeon: Jamesetta So, MD;  Location: [facility];  Service: General;  Laterality: N/A;  . Colonoscopy with propofol N/A 04/08/2015    Procedure: COLONOSCOPY WITH PROPOFOL;  Surgeon: Daneil Dolin, MD;  Location[facility];  Service: Endoscopy;  Laterality: N/A;  0815 - moved to 9:00 - office to notify    There were no vitals filed for this visit.      Subjective Assessment - 08/16/15 0910    Subjective Pt stated her pain continues to be high, pain  scale 8/10 with sharp, throbbing achey pain neck and lower back with radicular symptoms down lateral aspect of Lt LE past knee   Pertinent History DM and HTN    Patient Stated Goals Pt states that she wants to get better, and feel better, with less pain.   Currently in Pain? Yes   Pain Score 8    Pain Location Back   Pain Orientation Upper;Mid;Lower   Pain Descriptors / Indicators Sharp;Throbbing;Aching   Pain Type Acute pain   Pain Radiating Towards L LE down to mid calf   Pain Onset More than a month ago   Pain Frequency Constant   Aggravating Factors  movement   Pain Relieving Factors comfortable position   Effect of Pain on Daily Activities cannot play with grand kids, cannot go out into community well for PLOF based tasks                         [facility] Adult PT Treatment/Exercise - 08/16/15 0001    Lumbar Exercises: Stretches   Single Knee to Chest Stretch 5 reps;10 seconds   Single Knee to Chest Stretch Limitations passive due to reports of increased shoulder pain with [facility] with towel assistance   Double Knee to Chest Stretch 2 reps;20 seconds   Double Knee to Chest  Stretch Limitations passive due to reports of increased shoulder pain with [facility] with towel assistance   Lower Trunk Rotation 5 reps;10 seconds   Lumbar Exercises: Supine   Other Supine Lumbar Exercises Decompression exercises 1-5   Moist Heat Therapy   Number Minutes Moist Heat 15 Minutes   Moist Heat Location Other (comment)  cervical and lumbar   Manual Therapy   Manual Therapy Soft tissue mobilization   Manual therapy comments Manual complete separate rest of treatment   Soft tissue mobilization gentle STM upper traps, levator scapula                  PT Short Term Goals - 08/14/15 0932    PT SHORT TERM GOAL #1   Title Patient will demonstrate at least 25 degree improvement in cervical and shoulder ROM all planes in order to improve mechanics and reduce pain    Time 4   Period  Weeks   Status On-going   PT SHORT TERM GOAL #2   Title Patient to experience no more than 5/10 pain in order to improve overall QOL    Baseline 7/5- walked in with an 8/10 today; on average has been a 7/10   Time 4   Period Weeks   Status On-going   PT SHORT TERM GOAL #3   Title Patient to be able to maintain correct posture at least 70% of the time in order to assist in reducing pain and improving mechanics    Time 4   Period Weeks   Status On-going   PT SHORT TERM GOAL #4   Title Patient to be indpendent in correctly and consistently performing approipriate HEP, to be updated PRN    Baseline 7/5- reports compliance    Time 4   Period Weeks   Status Achieved   PT SHORT TERM GOAL #5   Title Pt will demonstrate increase in walking/standing tolerance from 76mn to 30 minutes to improve ability to perform community ambulation activities such as grocery shopping.    Time 4   Period Weeks   Status On-going   PT SHORT TERM GOAL #6   Title Pt will demonstrate increase in B LE strength in all areas of deficit by 1/2 MMT grade to improve stabilization required for reduction of pain with functional mobility.    Time 4   Period Weeks   Status On-going           PT Long Term Goals - 08/14/15 0934    PT LONG TERM GOAL #1   Title Patient to demonstrate cervical and shoulder ROM as being WFL on all planes in order to improve function and QOL    Time 8   Period Weeks   Status On-going   PT LONG TERM GOAL #2   Title Patient to experience no more than 2/10 pain in order to improve overall function and QOL    Time 8   Period Weeks   Status On-going   PT LONG TERM GOAL #3   Title Patient to be able to perform all selfcare tassk such as hairwashing, dressing, and bathing in order to regain independence and improve QOL    Baseline 7/5- cannot do hair by herself yet    Time 8   Period Weeks   Status Partially Met   PT LONG TERM GOAL #4   Title Patient to report she has been able to  sleep through the night without waking up secondary to pain in order to improve  overall QOL    Time 8   Period Weeks   Status On-going   PT LONG TERM GOAL #5   Title Pt will improve lumbar and thoracic AROM to [facility] to reduce pain and improve functional mobility tasks.    Time 8   Period Weeks   Status On-going   PT LONG TERM GOAL #6   Title Pt will express being able to tolerate standing or ambulating for 68mn-60min to improve pt's community ambulation tolerance required for shopping and running errands.    Time 8   Period Weeks   Status On-going               Plan - 08/16/15 09983   Clinical Impression Statement Pt continues to present with significant muscle guarding and high pain scales for neck through lower back.  Began session with decompression exercise to improve tolerance with supine position and postureal strengthening.  Utilized MHP during session to increase relaxaiton with neck and back musculature as well as verbal cueing to improve breathing for increased relaxation.  Ended session with STM to cervical musculature to reduce spasms with long strokes to reduce guarding.  EOS pt reports pain reduced to 7/10.  Reviewed exercises completeing at home, pt given printout of compressoin hose and educated on benefits of exercises in pool.  Pt given flyer for [employer]   Rehab Potential Good   PT Frequency 2x / week   PT Duration 4 weeks   PT Treatment/Interventions ADLs/Self Care Home Management;Cryotherapy;Biofeedback;Moist Heat;Functional mobility training;Therapeutic activities;Therapeutic exercise;Balance training;Neuromuscular re-education;Patient/family education;Manual techniques;Passive range of motion;Taping;Electrical Stimulation;Traction   PT Next Visit Plan moist heat, decompression exercises and extension exercises in supine. STM. Functional stretching. Suggest pool exercise to patient.    PT Home Exercise Plan Decompression exercises      Patient will  benefit from skilled therapeutic intervention in order to improve the following deficits and impairments:  Hypomobility, Impaired sensation, Decreased activity tolerance, Decreased strength, Pain, Increased fascial restricitons, Decreased mobility, Increased muscle spasms, Decreased range of motion, Improper body mechanics, Postural dysfunction  Visit Diagnosis: Radiculopathy, lumbar region  Bilateral low back pain with left-sided sciatica  Muscle spasm of back  Muscle weakness (generalized)  Abnormal posture  Cervicalgia  Stiffness of left shoulder, not elsewhere classified  Stiffness of right shoulder, not elsewhere classified     Problem List Patient Active Problem List   Diagnosis Date Noted  . Gastroesophageal reflux disease without esophagitis 07/24/2015  . Colon cancer screening   . Encounter for screening colonoscopy 03/14/2015  . Insomnia 11/26/2014  . Varicose veins 11/26/2014  . Osteoarthritis of both knees 08/28/2014  . Sleep apnea 05/25/2014  . Depression 02/04/2014  . Skin abscess 12/01/2012  . Hidradenitis 08/03/2012  . [place] 07/20/2012  . Folliculitis 038/25/0539 . Migraine 06/04/2008  . Essential hypertension 06/04/2008  . CONSTIPATION, CHRONIC 06/04/2008  . ARTHRITIS 06/04/2008  . ABDOMINAL PAIN 06/04/2008   CIhor Austin LGideon CUpham CAldona Lento7/08/2015, 10:19 AM  [place] [address]Phone: [phone]  Fax:  [phone] Name: Heidi CARLSONMRN: [id number] of Birth: [birth year]

## 2015-08-20 ENCOUNTER — Ambulatory Visit (HOSPITAL_COMMUNITY): Payer: Medicare Other | Admitting: Physical Therapy

## 2015-08-20 DIAGNOSIS — M25612 Stiffness of left shoulder, not elsewhere classified: Secondary | ICD-10-CM

## 2015-08-20 DIAGNOSIS — R293 Abnormal posture: Secondary | ICD-10-CM

## 2015-08-20 DIAGNOSIS — M25611 Stiffness of right shoulder, not elsewhere classified: Secondary | ICD-10-CM | POA: Diagnosis not present

## 2015-08-20 DIAGNOSIS — M5416 Radiculopathy, lumbar region: Secondary | ICD-10-CM | POA: Diagnosis not present

## 2015-08-20 DIAGNOSIS — M62830 Muscle spasm of back: Secondary | ICD-10-CM

## 2015-08-20 DIAGNOSIS — M542 Cervicalgia: Secondary | ICD-10-CM

## 2015-08-20 DIAGNOSIS — M5442 Lumbago with sciatica, left side: Secondary | ICD-10-CM | POA: Diagnosis not present

## 2015-08-20 DIAGNOSIS — M6281 Muscle weakness (generalized): Secondary | ICD-10-CM

## 2015-08-20 NOTE — Therapy (Signed)
[place], [place], [address] Phone: [phone]   Fax:  [phone]  Physical Therapy Treatment  Patient Details  Name: [name] MRN: [id number] Date of Birth: Dec 02, 1965 Referring Provider: Berle Mull  Encounter Date: 08/20/2015      PT End of Session - 08/20/15 0943    Visit Number 11   Number of Visits 17   Date for PT Re-Evaluation 09/04/15   Authorization Type Medicare (G-code done 9th session)   Authorization Time Period 07/10/15 to 08/02/74; new cert covers out ot 8/8   Authorization - Visit Number 11   Authorization - Number of Visits 19   PT Start Time 2831  setup/application of moist heat not included in billing    PT Stop Time 0943   PT Time Calculation (min) 28 min   Activity Tolerance Patient limited by pain   Behavior During Therapy [facility] for tasks assessed/performed      Past Medical History  Diagnosis Date  . Hypertension   . Diabetes mellitus without complication ([place])   . Migraine headache   . Allergy   . Sleep apnea   . Pulmonary nodule   . Hidradenitis suppurativa   . Folliculitis 06/26/6158  . Anxiety   . Arthritis     Past Surgical History  Procedure Laterality Date  . Abdominal hysterectomy    . Knee surgery Right 2012  . Endometrial ablation    . Pilonidal cyst excision  June 2011  . Foreign body removal N/A 05/26/2013    Procedure: FOREIGN BODY REMOVAL ADULT ABDOMINAL WALL;  Surgeon: Jamesetta So, MD;  Location: [facility];  Service: General;  Laterality: N/A;  . Colonoscopy with propofol N/A 04/08/2015    Procedure: COLONOSCOPY WITH PROPOFOL;  Surgeon: Daneil Dolin, MD;  Location: [facility];  Service: Endoscopy;  Laterality: N/A;  0815 - moved to 9:00 - office to notify    There were no vitals filed for this visit.      Subjective Assessment - 08/20/15 0917    Subjective Patient continues to express high pain scale with no major change in symtpmos over  weekend    Currently in Pain? Yes   Pain Score 8    Pain Location Back                         [facility] Adult PT Treatment/Exercise - 08/20/15 0001    Lumbar Exercises: Seated   Other Seated Lumbar Exercises core sets in sitting 1x15 with 3 second hold; core isometric holds against PT resistance 1x5 each direction    Lumbar Exercises: Supine   Other Supine Lumbar Exercises 3D cervical 1x10   Other Supine Lumbar Exercises Decompress exercises 2-4 1x10   Moist Heat Therapy   Number Minutes Moist Heat 10 Minutes   Moist Heat Location Other (comment)  lumbar, thoracic, cervical areas                 PT Education - 08/20/15 0943    Education provided Yes   Education Details encouraged to participate with water exercise at [facility]) Educated Patient   Methods Explanation   Comprehension Verbalized understanding          PT Short Term Goals - 08/14/15 0932    PT SHORT TERM GOAL #1   Title Patient will demonstrate at least 25 degree improvement in cervical and shoulder ROM all planes in order  to improve mechanics and reduce pain    Time 4   Period Weeks   Status On-going   PT SHORT TERM GOAL #2   Title Patient to experience no more than 5/10 pain in order to improve overall QOL    Baseline 7/5- walked in with an 8/10 today; on average has been a 7/10   Time 4   Period Weeks   Status On-going   PT SHORT TERM GOAL #3   Title Patient to be able to maintain correct posture at least 70% of the time in order to assist in reducing pain and improving mechanics    Time 4   Period Weeks   Status On-going   PT SHORT TERM GOAL #4   Title Patient to be indpendent in correctly and consistently performing approipriate HEP, to be updated PRN    Baseline 7/5- reports compliance    Time 4   Period Weeks   Status Achieved   PT SHORT TERM GOAL #5   Title Pt will demonstrate increase in walking/standing tolerance from 36mn to 30 minutes to improve ability to  perform community ambulation activities such as grocery shopping.    Time 4   Period Weeks   Status On-going   PT SHORT TERM GOAL #6   Title Pt will demonstrate increase in B LE strength in all areas of deficit by 1/2 MMT grade to improve stabilization required for reduction of pain with functional mobility.    Time 4   Period Weeks   Status On-going           PT Long Term Goals - 08/14/15 0934    PT LONG TERM GOAL #1   Title Patient to demonstrate cervical and shoulder ROM as being WFL on all planes in order to improve function and QOL    Time 8   Period Weeks   Status On-going   PT LONG TERM GOAL #2   Title Patient to experience no more than 2/10 pain in order to improve overall function and QOL    Time 8   Period Weeks   Status On-going   PT LONG TERM GOAL #3   Title Patient to be able to perform all selfcare tassk such as hairwashing, dressing, and bathing in order to regain independence and improve QOL    Baseline 7/5- cannot do hair by herself yet    Time 8   Period Weeks   Status Partially Met   PT LONG TERM GOAL #4   Title Patient to report she has been able to sleep through the night without waking up secondary to pain in order to improve overall QOL    Time 8   Period Weeks   Status On-going   PT LONG TERM GOAL #5   Title Pt will improve lumbar and thoracic [facility]OM to [facility] reduce pain and improve functional mobility tasks.    Time 8   Period Weeks   Status On-going   PT LONG TERM GOAL #6   Title Pt will express being able to tolerate standing or ambulating for 459m-60min to improve pt's community ambulation tolerance required for shopping and running errands.    Time 8   Period Weeks   Status On-going               Plan - 08/20/15 096433  Clinical Impression Statement Began session with time on moist heat for lumbar, thoracic, cervical areas (not included in billing), followed by decompression exercises and  otherwise general proximal strengthening  and postural training. Patient continues to be quite rigid in her hips and spine, with quite a bit of muscle guarding that does not seem to allow for free movement and could be related to her pain levels. She continues to feel pain relief from moist heat, which does imply that at least part of her pain is related to soft tissue impairment.  Poor tolerance to even core isometrics seated at edge of mat table, of which patient refused to attempt left lateral trunk flexion secondary to pain.    Rehab Potential Good   PT Frequency 2x / week   PT Duration 4 weeks   PT Treatment/Interventions ADLs/Self Care Home Management;Cryotherapy;Biofeedback;Moist Heat;Functional mobility training;Therapeutic activities;Therapeutic exercise;Balance training;Neuromuscular re-education;Patient/family education;Manual techniques;Passive range of motion;Taping;Electrical Stimulation;Traction   PT Next Visit Plan moist heat, decompression exercises and extension exercises in supine. STM. Functional stretching. Suggest pool exercise to patient.    Consulted and Agree with Plan of Care Patient      Patient will benefit from skilled therapeutic intervention in order to improve the following deficits and impairments:  Hypomobility, Impaired sensation, Decreased activity tolerance, Decreased strength, Pain, Increased fascial restricitons, Decreased mobility, Increased muscle spasms, Decreased range of motion, Improper body mechanics, Postural dysfunction  Visit Diagnosis: Radiculopathy, lumbar region  Bilateral low back pain with left-sided sciatica  Muscle spasm of back  Muscle weakness (generalized)  Abnormal posture  Cervicalgia  Stiffness of left shoulder, not elsewhere classified  Stiffness of right shoulder, not elsewhere classified     Problem List Patient Active Problem List   Diagnosis Date Noted  . Gastroesophageal reflux disease without esophagitis 07/24/2015  . Colon cancer screening   .  Encounter for screening colonoscopy 03/14/2015  . Insomnia 11/26/2014  . Varicose veins 11/26/2014  . Osteoarthritis of both knees 08/28/2014  . Sleep apnea 05/25/2014  . Depression 02/04/2014  . Skin abscess 12/01/2012  . Hidradenitis 08/03/2012  . Diabetes ([place]) 07/20/2012  . Folliculitis 02/54/2706  . Migraine 06/04/2008  . Essential hypertension 06/04/2008  . CONSTIPATION, CHRONIC 06/04/2008  . ARTHRITIS 06/04/2008  . ABDOMINAL PAIN 06/04/2008    Deniece Ree PT, DPT [place] [address] [place], [place], [address] Phone: [phone]   Fax:  [phone]  Name: MARCELLINA JONSSON MRN: [id number] Date of Birth: Aug 17, 1965

## 2015-08-22 ENCOUNTER — Emergency Department (HOSPITAL_COMMUNITY)
Admission: EM | Admit: 2015-08-22 | Discharge: 2015-08-22 | Disposition: A | Discharge status: Home / Self Care | Payer: Medicare Other | Attending: Emergency Medicine | Admitting: Emergency Medicine

## 2015-08-22 ENCOUNTER — Encounter (HOSPITAL_COMMUNITY): Payer: Self-pay | Admitting: *Deleted

## 2015-08-22 ENCOUNTER — Ambulatory Visit (HOSPITAL_COMMUNITY): Payer: Medicare Other | Admitting: Physical Therapy

## 2015-08-22 DIAGNOSIS — Y939 Activity, unspecified: Secondary | ICD-10-CM | POA: Insufficient documentation

## 2015-08-22 DIAGNOSIS — Y999 Unspecified external cause status: Secondary | ICD-10-CM | POA: Insufficient documentation

## 2015-08-22 DIAGNOSIS — Z79899 Other long term (current) drug therapy: Secondary | ICD-10-CM | POA: Diagnosis not present

## 2015-08-22 DIAGNOSIS — E119 Type 2 diabetes mellitus without complications: Secondary | ICD-10-CM | POA: Insufficient documentation

## 2015-08-22 DIAGNOSIS — M5416 Radiculopathy, lumbar region: Secondary | ICD-10-CM

## 2015-08-22 DIAGNOSIS — M5442 Lumbago with sciatica, left side: Secondary | ICD-10-CM

## 2015-08-22 DIAGNOSIS — I10 Essential (primary) hypertension: Secondary | ICD-10-CM | POA: Diagnosis not present

## 2015-08-22 DIAGNOSIS — Y929 Unspecified place or not applicable: Secondary | ICD-10-CM | POA: Diagnosis not present

## 2015-08-22 DIAGNOSIS — S29019D Strain of muscle and tendon of unspecified wall of thorax, subsequent encounter: Secondary | ICD-10-CM

## 2015-08-22 DIAGNOSIS — Z7982 Long term (current) use of aspirin: Secondary | ICD-10-CM | POA: Insufficient documentation

## 2015-08-22 DIAGNOSIS — R293 Abnormal posture: Secondary | ICD-10-CM

## 2015-08-22 DIAGNOSIS — M25612 Stiffness of left shoulder, not elsewhere classified: Secondary | ICD-10-CM

## 2015-08-22 DIAGNOSIS — S29012D Strain of muscle and tendon of back wall of thorax, subsequent encounter: Secondary | ICD-10-CM | POA: Diagnosis not present

## 2015-08-22 DIAGNOSIS — M25611 Stiffness of right shoulder, not elsewhere classified: Secondary | ICD-10-CM

## 2015-08-22 DIAGNOSIS — M6281 Muscle weakness (generalized): Secondary | ICD-10-CM

## 2015-08-22 DIAGNOSIS — S39012D Strain of muscle, fascia and tendon of lower back, subsequent encounter: Secondary | ICD-10-CM | POA: Insufficient documentation

## 2015-08-22 DIAGNOSIS — S3992XD Unspecified injury of lower back, subsequent encounter: Secondary | ICD-10-CM | POA: Diagnosis present

## 2015-08-22 DIAGNOSIS — M1990 Unspecified osteoarthritis, unspecified site: Secondary | ICD-10-CM | POA: Insufficient documentation

## 2015-08-22 DIAGNOSIS — M542 Cervicalgia: Secondary | ICD-10-CM

## 2015-08-22 DIAGNOSIS — M62830 Muscle spasm of back: Secondary | ICD-10-CM

## 2015-08-22 MED ORDER — METHOCARBAMOL 500 MG PO TABS: 500.0000 mg | ORAL_TABLET | Freq: Three times a day (TID) | ORAL | Status: DC

## 2015-08-22 MED ORDER — OXYCODONE-ACETAMINOPHEN 5-325 MG PO TABS
1.0000 | ORAL_TABLET | Freq: Once | ORAL | Status: AC
  Administered 2015-08-22: 1 via ORAL
  Filled 2015-08-22: qty 1

## 2015-08-22 MED ORDER — IBUPROFEN 800 MG PO TABS
800.0000 mg | ORAL_TABLET | Freq: Once | ORAL | Status: AC
  Administered 2015-08-22: 800 mg via ORAL
  Filled 2015-08-22: qty 1

## 2015-08-22 MED ORDER — TRAMADOL HCL 50 MG PO TABS
50.0000 mg | ORAL_TABLET | Freq: Four times a day (QID) | ORAL | Status: DC | PRN
Start: 2015-08-22 — End: 2015-08-26

## 2015-08-22 MED ORDER — METHOCARBAMOL 500 MG PO TABS
500.0000 mg | ORAL_TABLET | Freq: Once | ORAL | Status: AC
  Administered 2015-08-22: 500 mg via ORAL
  Filled 2015-08-22: qty 1

## 2015-08-22 MED ORDER — NAPROXEN 500 MG PO TABS: 500.0000 mg | ORAL_TABLET | Freq: Two times a day (BID) | ORAL | Status: DC
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
## 2015-08-22 NOTE — Therapy (Signed)
[facility] [address] [place], [place], [address] Phone: [phone]   Fax:  [phone]  Patient Details  Name: [name] MRN: [id number] Date of Birth: 27-Aug-1965 Referring Provider:  Pati GalloKramer, James, MD  Encounter Date: 08/22/2015   Patient arrived today complaining that she was not feeling well and had a migraine but wanted to try working with PT; began session with unbilled time in supine on moist heat packs however after approximately 5 minutes on heat packs patient became nauseous with worsening symptoms of malaise. Terminated session at this time due to patient not feeling well and will continue with skilled PT services at next appointment. No charge for today's session.      Heidi HaiKristen Cartrell Bentsen PT, DPT [phone]  [facility] [address] [place], [place], [address] Phone: [phone]   Fax:  [phone]
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
## 2015-08-26 ENCOUNTER — Ambulatory Visit (INDEPENDENT_AMBULATORY_CARE_PROVIDER_SITE_OTHER): Payer: Medicare Other | Admitting: Family Medicine

## 2015-08-26 ENCOUNTER — Encounter: Payer: Self-pay | Admitting: Family Medicine

## 2015-08-26 VITALS — BP 132/84 | Ht 65.0 in | Wt 223.4 lb

## 2015-08-26 DIAGNOSIS — E119 Type 2 diabetes mellitus without complications: Secondary | ICD-10-CM

## 2015-08-26 DIAGNOSIS — E785 Hyperlipidemia, unspecified: Secondary | ICD-10-CM | POA: Diagnosis not present

## 2015-08-26 DIAGNOSIS — G43019 Migraine without aura, intractable, without status migrainosus: Secondary | ICD-10-CM | POA: Diagnosis not present

## 2015-08-26 DIAGNOSIS — M25559 Pain in unspecified hip: Secondary | ICD-10-CM | POA: Diagnosis not present

## 2015-08-26 DIAGNOSIS — I10 Essential (primary) hypertension: Secondary | ICD-10-CM | POA: Diagnosis not present

## 2015-08-26 DIAGNOSIS — M25569 Pain in unspecified knee: Secondary | ICD-10-CM

## 2015-08-26 LAB — POCT GLYCOSYLATED HEMOGLOBIN (HGB A1C): HEMOGLOBIN A1C: 6.6

## 2015-08-26 MED ORDER — METOPROLOL TARTRATE 100 MG PO TABS: 100.0000 mg | ORAL_TABLET | Freq: Two times a day (BID) | ORAL | Status: DC

## 2015-08-26 MED ORDER — TRAMADOL HCL 50 MG PO TABS: 50.0000 mg | ORAL_TABLET | Freq: Three times a day (TID) | ORAL | Status: DC | PRN

## 2015-08-26 MED ORDER — DULOXETINE HCL 20 MG PO CPEP: ORAL_CAPSULE | ORAL | Status: DC

## 2015-08-26 MED ORDER — TRIAMTERENE-HCTZ 37.5-25 MG PO TABS: 1.0000 | ORAL_TABLET | Freq: Every day | ORAL | Status: DC

## 2015-08-26 MED ORDER — TRAZODONE HCL 100 MG PO TABS
100.0000 mg | ORAL_TABLET | Freq: Every day | ORAL | Status: DC
Start: 2015-08-26 — End: 2018-04-26

## 2015-08-26 MED ORDER — HYDROCODONE-ACETAMINOPHEN 10-325 MG PO TABS: 1.0000 | ORAL_TABLET | Freq: Four times a day (QID) | ORAL | Status: DC | PRN
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
## 2015-08-27 ENCOUNTER — Ambulatory Visit (HOSPITAL_COMMUNITY): Payer: Medicare Other | Admitting: Physical Therapy

## 2015-08-27 DIAGNOSIS — M6281 Muscle weakness (generalized): Secondary | ICD-10-CM | POA: Diagnosis not present

## 2015-08-27 DIAGNOSIS — M542 Cervicalgia: Secondary | ICD-10-CM | POA: Diagnosis not present

## 2015-08-27 DIAGNOSIS — M5442 Lumbago with sciatica, left side: Secondary | ICD-10-CM | POA: Diagnosis not present

## 2015-08-27 DIAGNOSIS — R293 Abnormal posture: Secondary | ICD-10-CM

## 2015-08-27 DIAGNOSIS — M25612 Stiffness of left shoulder, not elsewhere classified: Secondary | ICD-10-CM | POA: Diagnosis not present

## 2015-08-27 DIAGNOSIS — M5416 Radiculopathy, lumbar region: Secondary | ICD-10-CM | POA: Diagnosis not present

## 2015-08-27 DIAGNOSIS — M62830 Muscle spasm of back: Secondary | ICD-10-CM

## 2015-08-27 DIAGNOSIS — M25611 Stiffness of right shoulder, not elsewhere classified: Secondary | ICD-10-CM | POA: Diagnosis not present
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
## 2015-08-28 DIAGNOSIS — S335XXA Sprain of ligaments of lumbar spine, initial encounter: Secondary | ICD-10-CM | POA: Diagnosis not present

## 2015-08-28 DIAGNOSIS — S139XXA Sprain of joints and ligaments of unspecified parts of neck, initial encounter: Secondary | ICD-10-CM | POA: Diagnosis not present

## 2015-08-29 ENCOUNTER — Ambulatory Visit (HOSPITAL_COMMUNITY): Payer: Medicare Other

## 2015-09-02 ENCOUNTER — Ambulatory Visit (INDEPENDENT_AMBULATORY_CARE_PROVIDER_SITE_OTHER): Payer: Medicare Other | Admitting: Otolaryngology

## 2015-09-02 ENCOUNTER — Other Ambulatory Visit (INDEPENDENT_AMBULATORY_CARE_PROVIDER_SITE_OTHER): Payer: Self-pay | Admitting: Otolaryngology

## 2015-09-02 DIAGNOSIS — R1310 Dysphagia, unspecified: Secondary | ICD-10-CM

## 2015-09-02 DIAGNOSIS — R1312 Dysphagia, oropharyngeal phase: Secondary | ICD-10-CM

## 2015-09-02 DIAGNOSIS — R490 Dysphonia: Secondary | ICD-10-CM

## 2015-09-02 DIAGNOSIS — M542 Cervicalgia: Secondary | ICD-10-CM | POA: Diagnosis not present

## 2015-09-02 DIAGNOSIS — E785 Hyperlipidemia, unspecified: Secondary | ICD-10-CM | POA: Diagnosis not present

## 2015-09-02 DIAGNOSIS — E119 Type 2 diabetes mellitus without complications: Secondary | ICD-10-CM | POA: Diagnosis not present

## 2015-09-02 DIAGNOSIS — I10 Essential (primary) hypertension: Secondary | ICD-10-CM | POA: Diagnosis not present

## 2015-09-02 DIAGNOSIS — M545 Low back pain: Secondary | ICD-10-CM | POA: Diagnosis not present

## 2015-09-03 ENCOUNTER — Encounter (HOSPITAL_COMMUNITY): Payer: Medicare Other | Admitting: Physical Therapy

## 2015-09-03 ENCOUNTER — Encounter: Payer: Self-pay | Admitting: Family Medicine

## 2015-09-03 LAB — LIPID PANEL
Chol/HDL Ratio: 4.4 ratio units (ref 0.0–4.4)
Cholesterol, Total: 129 mg/dL (ref 100–199)
HDL: 29 mg/dL — AB (ref >39)
LDL CALC: 69 mg/dL (ref 0–99)
TRIGLYCERIDES: 157 mg/dL — AB (ref 0–149)
VLDL CHOLESTEROL CAL: 31 mg/dL (ref 5–40)

## 2015-09-03 LAB — BASIC METABOLIC PANEL
BUN/Creatinine Ratio: 17 (ref 9–23)
BUN: 14 mg/dL (ref 6–24)
CALCIUM: 9.9 mg/dL (ref 8.7–10.2)
CO2: 26 mmol/L (ref 18–29)
CREATININE: 0.82 mg/dL (ref 0.57–1.00)
Chloride: 99 mmol/L (ref 96–106)
GFR calc Af Amer: 96 mL/min/{1.73_m2} (ref >59)
GFR, EST NON AFRICAN AMERICAN: 83 mL/min/{1.73_m2} (ref >59)
Glucose: 114 mg/dL — ABNORMAL HIGH (ref 65–99)
POTASSIUM: 4.4 mmol/L (ref 3.5–5.2)
Sodium: 140 mmol/L (ref 134–144)

## 2015-09-03 LAB — HEPATIC FUNCTION PANEL
ALBUMIN: 4.4 g/dL (ref 3.5–5.5)
ALT: 20 IU/L (ref 0–32)
AST: 17 IU/L (ref 0–40)
Alkaline Phosphatase: 67 IU/L (ref 39–117)
BILIRUBIN TOTAL: 0.4 mg/dL (ref 0.0–1.2)
Bilirubin, Direct: 0.12 mg/dL (ref 0.00–0.40)
Total Protein: 7.6 g/dL (ref 6.0–8.5)

## 2015-09-03 LAB — MICROALBUMIN / CREATININE URINE RATIO
Creatinine, Urine: 73.6 mg/dL
Microalbumin, Urine: <3 ug/mL

## 2015-09-05 ENCOUNTER — Encounter (HOSPITAL_COMMUNITY): Payer: Medicare Other | Admitting: Physical Therapy

## 2015-09-11 ENCOUNTER — Ambulatory Visit (INDEPENDENT_AMBULATORY_CARE_PROVIDER_SITE_OTHER): Payer: Medicare Other | Admitting: Gastroenterology

## 2015-09-11 ENCOUNTER — Encounter: Payer: Self-pay | Admitting: Gastroenterology

## 2015-09-11 VITALS — BP 109/70 | HR 74 | Temp 97.3°F | Ht 65.0 in | Wt 218.6 lb

## 2015-09-11 DIAGNOSIS — R1310 Dysphagia, unspecified: Secondary | ICD-10-CM | POA: Diagnosis not present

## 2015-09-11 DIAGNOSIS — K5909 Other constipation: Secondary | ICD-10-CM | POA: Diagnosis not present

## 2015-09-11 MED ORDER — LINACLOTIDE 145 MCG PO CAPS: 145.0000 ug | ORAL_CAPSULE | Freq: Every day | ORAL | 3 refills | Status: DC

## 2015-09-11 NOTE — Progress Notes (Signed)
Referring Provider: Babs Sciara, MD Primary Care Physician:  Lilyan Punt, MD  Primary GI: Dr. Jena Gauss   Chief Complaint  Patient presents with  . Follow-up    doing well    HPI:   [name] is a 50 y.o. female presenting today with a history of constipation, doing well on Linzess historically. Just completed initial screening colonoscopy and due again in 10 years.   If doesn't take the Linzess, will have no BM at all. Takes the Linzess every other day. Sometimes feels like she doesn't want to take it every day. Just saw ENT (Dr. Suszanne Conners) and will have a swallowing test on 8/4. Had a procedure in the office. Will have a "better look" on 8/4. When eats, feels like food gets trapped in her neck. Sometimes when just swallowing spit, feels like it is trapping up. Dr. Gerda Diss started patient on ranitidine in the evening. PCP referred to Dr. Suszanne Conners. These new symptoms started a few months ago.   Past Medical History:  Diagnosis Date  . Allergy   . Anxiety   . Arthritis   . Diabetes mellitus without complication ([facility])   . Folliculitis 07/20/2012  . Hidradenitis suppurativa   . Hypertension   . Migraine headache   . Pulmonary nodule   . Sleep apnea     Past Surgical History:  Procedure Laterality Date  . ABDOMINAL HYSTERECTOMY    . COLONOSCOPY WITH PROPOFOL N/A 04/08/2015   Dr. Jena Gauss: normal, repeat in 10 years   . ENDOMETRIAL ABLATION    . FOREIGN BODY REMOVAL N/A 05/26/2013   Procedure: FOREIGN BODY REMOVAL ADULT ABDOMINAL WALL;  Surgeon: Dalia Heading, MD;  Location: [facility] OR[facility];  Service: General;  Laterality: N/A;  . KNEE SURGERY Right 2012  . PILONIDAL CYST EXCISION  June 2011    Current Outpatient Prescriptions  Medication Sig Dispense Refill  . aspirin EC 81 MG tablet Take 81 mg by mouth daily.    . cyclobenzaprine (FLEXERIL) 10 MG tablet Take 1 tablet (10 mg total) by mouth 3 (three) times daily as needed for muscle spasms. 20 tablet 0  . diphenhydrAMINE (BENADRYL)  25 MG tablet Take 25 mg by mouth 2 (two) times daily as needed for allergies.    Marland Kitchen docusate sodium (COLACE) 100 MG capsule Take 100 mg by mouth daily as needed for mild constipation.    . DULoxetine (CYMBALTA) 20 MG capsule 2 po qd-stop lexapro 60 capsule 5  . fluticasone (FLONASE) 50 MCG/ACT nasal spray Place 2 sprays into both nostrils daily as needed for allergies or rhinitis. 16 g 1  . HYDROcodone-acetaminophen (NORCO) 10-325 MG tablet Take 1 tablet by mouth every 6 (six) hours as needed. 40 tablet 0  . Linaclotide (LINZESS) 145 MCG CAPS capsule Take 1 capsule (145 mcg total) by mouth daily. 90 capsule 1  . Liraglutide (VICTOZA) 18 MG/3ML SOPN Inject 1.2 mg into the skin daily.    . metoprolol (LOPRESSOR) 100 MG tablet Take 1 tablet (100 mg total) by mouth 2 (two) times daily. 180 tablet 1  . naproxen (NAPROSYN) 500 MG tablet 500 mg daily as needed.    . ranitidine (ZANTAC) 300 MG tablet Take 1 tablet (300 mg total) by mouth at bedtime. 30 tablet 5  . traZODone (DESYREL) 100 MG tablet Take 1 tablet (100 mg total) by mouth at bedtime. 90 tablet 1  . triamcinolone cream (KENALOG) 0.1 % APPLY ONE APPLICATION TOPICALLY TWICE DAILY AS NEEDED FOR RASH; USE UP  TO 2 WEEKS 30 g 5  . triamterene-hydrochlorothiazide (MAXZIDE-25) 37.5-25 MG tablet Take 1 tablet by mouth daily. 90 tablet 3  . valACYclovir (VALTREX) 1000 MG tablet 2 po x 2 doses 12 hours apart prn fever blisters 4 tablet 5  . Exenatide ER (BYDUREON) 2 MG PEN Inject 2 mg into the skin once a week. (Patient not taking: Reported on 05/02/2015) 1 each 5  . metroNIDAZOLE (FLAGYL) 500 MG tablet 4 po x 1 dose (Patient not taking: Reported on 07/22/2015) 4 tablet 0  . polyethylene glycol-electrolytes (NULYTELY/GOLYTELY) 420 g solution Take 4,000 mLs by mouth once. (Patient not taking: Reported on 05/02/2015) 4000 mL 0   No current facility-administered medications for this visit.     Allergies as of 09/11/2015 - Review Complete 09/11/2015  Allergen  Reaction Noted  . Bactrim [sulfamethoxazole-trimethoprim] Rash 03/16/2013  . Lisinopril  08/28/2014  . Other  08/03/2012    Family History  Problem Relation Age of Onset  . Stroke Mother   . Hypertension Mother   . Thyroid disease Father   . Hypertension Sister   . Hypertension Brother   . Cancer Paternal Grandfather     prostate  . Colon cancer Maternal Grandfather     Social History   Social History  . Marital status: Single    Spouse name: N/A  . Number of children: N/A  . Years of education: N/A   Occupational History  . unemployed    Social History Main Topics  . Smoking status: Never Smoker  . Smokeless tobacco: Never Used  . Alcohol use No  . Drug use: No  . Sexual activity: Not Currently    Birth control/ protection: Surgical   Other Topics Concern  . None   Social History Narrative  . None    Review of Systems: As mentioned in HPI   Physical Exam: BP 109/70   Pulse 74   Temp 97.3 F (36.3 C) (Oral)   Ht 5\' 5"  (1.651 m)   Wt 218 lb 9.6 oz (99.2 kg)   BMI 36.38 kg/m  General:   Alert and oriented. No distress noted. Pleasant and cooperative.  Head:  Normocephalic and atraumatic. Abdomen:  +BS, soft, non-tender and non-distended. No rebound or guarding. No HSM or masses noted. Msk:  Symmetrical without gross deformities. Normal posture. Extremities:  Without edema. Neurologic:  Alert and  oriented x4;  grossly normal neurologically. Psych:  Alert and cooperative. Normal mood and affect.

## 2015-09-11 NOTE — Progress Notes (Signed)
cc'ed to pcp °

## 2015-09-11 NOTE — Assessment & Plan Note (Signed)
50 year old female doing well with Linzess 145 but at times seems to be overshooting the mark. Will trial Linzess 72 mcg. Return in 6-8 months.

## 2015-09-11 NOTE — Patient Instructions (Signed)
Continue Linzess as you are doing.   Please let us know if your swallowing does not get better. The ENT doctor will refer you to [place] if needed.   We can see you in 6-8 months.

## 2015-09-11 NOTE — Assessment & Plan Note (Signed)
Appears to be cervical dysphagia. Has been referred to ENT and already established care with Dr. Suszanne Tyler. Does not appear to have any esophageal component. Discussed signs/symptoms to report if needed to [place]. Will have her return in 6-8 months.

## 2015-09-13 ENCOUNTER — Ambulatory Visit (HOSPITAL_COMMUNITY)
Admission: RE | Admit: 2015-09-13 | Discharge: 2015-09-13 | Disposition: A | Discharge status: Home / Self Care | Payer: Medicare Other | Source: Ambulatory Visit | Attending: Otolaryngology | Admitting: Otolaryngology

## 2015-09-13 DIAGNOSIS — R1310 Dysphagia, unspecified: Secondary | ICD-10-CM | POA: Diagnosis not present

## 2015-09-19 DIAGNOSIS — M545 Low back pain: Secondary | ICD-10-CM | POA: Diagnosis not present

## 2015-09-19 DIAGNOSIS — M5416 Radiculopathy, lumbar region: Secondary | ICD-10-CM | POA: Diagnosis not present

## 2015-09-19 DIAGNOSIS — M5412 Radiculopathy, cervical region: Secondary | ICD-10-CM | POA: Diagnosis not present

## 2015-09-19 DIAGNOSIS — M542 Cervicalgia: Secondary | ICD-10-CM | POA: Diagnosis not present

## 2015-10-02 DIAGNOSIS — M5412 Radiculopathy, cervical region: Secondary | ICD-10-CM | POA: Diagnosis not present

## 2015-10-02 DIAGNOSIS — M542 Cervicalgia: Secondary | ICD-10-CM | POA: Diagnosis not present

## 2015-10-11 DIAGNOSIS — M5416 Radiculopathy, lumbar region: Secondary | ICD-10-CM | POA: Diagnosis not present

## 2015-10-11 DIAGNOSIS — M545 Low back pain: Secondary | ICD-10-CM | POA: Diagnosis not present

## 2015-10-24 ENCOUNTER — Ambulatory Visit (INDEPENDENT_AMBULATORY_CARE_PROVIDER_SITE_OTHER): Payer: Medicare Other | Admitting: Otolaryngology

## 2015-10-24 DIAGNOSIS — K219 Gastro-esophageal reflux disease without esophagitis: Secondary | ICD-10-CM | POA: Diagnosis not present

## 2015-10-24 DIAGNOSIS — R1312 Dysphagia, oropharyngeal phase: Secondary | ICD-10-CM

## 2015-10-25 DIAGNOSIS — M5412 Radiculopathy, cervical region: Secondary | ICD-10-CM | POA: Diagnosis not present

## 2015-10-25 DIAGNOSIS — M542 Cervicalgia: Secondary | ICD-10-CM | POA: Diagnosis not present

## 2015-11-11 DIAGNOSIS — M5412 Radiculopathy, cervical region: Secondary | ICD-10-CM | POA: Diagnosis not present

## 2015-11-11 DIAGNOSIS — M5416 Radiculopathy, lumbar region: Secondary | ICD-10-CM | POA: Diagnosis not present

## 2015-11-11 DIAGNOSIS — S335XXA Sprain of ligaments of lumbar spine, initial encounter: Secondary | ICD-10-CM | POA: Diagnosis not present

## 2015-11-13 DIAGNOSIS — M5416 Radiculopathy, lumbar region: Secondary | ICD-10-CM | POA: Diagnosis not present

## 2015-11-13 DIAGNOSIS — M545 Low back pain: Secondary | ICD-10-CM | POA: Diagnosis not present

## 2015-11-21 ENCOUNTER — Other Ambulatory Visit: Payer: Self-pay | Admitting: Family Medicine

## 2015-11-21 DIAGNOSIS — Z1231 Encounter for screening mammogram for malignant neoplasm of breast: Secondary | ICD-10-CM

## 2015-11-27 ENCOUNTER — Telehealth: Payer: Self-pay | Admitting: Family Medicine

## 2015-11-27 NOTE — Telephone Encounter (Signed)
Pt is requesting a refill on her HYDROcodone-acetaminophen (NORCO) 10-325 MG tablet. °

## 2015-11-28 ENCOUNTER — Ambulatory Visit (HOSPITAL_COMMUNITY)
Admission: RE | Admit: 2015-11-28 | Discharge: 2015-11-28 | Disposition: A | Discharge status: Home / Self Care | Payer: Medicare Other | Source: Ambulatory Visit | Attending: Family Medicine | Admitting: Family Medicine

## 2015-11-28 DIAGNOSIS — Z1231 Encounter for screening mammogram for malignant neoplasm of breast: Secondary | ICD-10-CM

## 2015-11-28 MED ORDER — HYDROCODONE-ACETAMINOPHEN 10-325 MG PO TABS: 1.0000 | ORAL_TABLET | Freq: Four times a day (QID) | ORAL | 0 refills | Status: DC | PRN

## 2015-11-28 NOTE — Telephone Encounter (Signed)
Spoke with patient and informed her per Dr.Scott Luking- She may have 20 tablets, one every 6 hours when necessary pain caution drowsiness, this is a schedule 2 drug. Requires office visits on a regular basis in order to prescribe if it is been greater than 3 months-and it has- we cannot do further refills until patient is seen. Patient verbalized understanding.

## 2015-11-28 NOTE — Telephone Encounter (Signed)
She may have 20 tablets, one every 6 hours when necessary pain caution drowsiness, this is a schedule 2 drug. Requires office visits on a regular basis in order to prescribe if it is been greater than 3 months-and it has- we cannot do further refills until patient is seen

## 2015-11-29 DIAGNOSIS — M5412 Radiculopathy, cervical region: Secondary | ICD-10-CM | POA: Diagnosis not present

## 2015-12-02 ENCOUNTER — Encounter: Payer: Self-pay | Admitting: Family Medicine

## 2015-12-02 ENCOUNTER — Ambulatory Visit (INDEPENDENT_AMBULATORY_CARE_PROVIDER_SITE_OTHER): Payer: Medicare Other | Admitting: Family Medicine

## 2015-12-02 VITALS — BP 118/72 | Ht 65.0 in

## 2015-12-02 DIAGNOSIS — M545 Low back pain, unspecified: Secondary | ICD-10-CM | POA: Insufficient documentation

## 2015-12-02 DIAGNOSIS — I10 Essential (primary) hypertension: Secondary | ICD-10-CM

## 2015-12-02 DIAGNOSIS — E119 Type 2 diabetes mellitus without complications: Secondary | ICD-10-CM

## 2015-12-02 DIAGNOSIS — Z23 Encounter for immunization: Secondary | ICD-10-CM

## 2015-12-02 DIAGNOSIS — G43019 Migraine without aura, intractable, without status migrainosus: Secondary | ICD-10-CM | POA: Diagnosis not present

## 2015-12-02 DIAGNOSIS — G8929 Other chronic pain: Secondary | ICD-10-CM

## 2015-12-02 MED ORDER — HYDROCODONE-ACETAMINOPHEN 10-325 MG PO TABS: ORAL_TABLET | ORAL | 0 refills | Status: DC

## 2015-12-02 NOTE — Progress Notes (Signed)
   Subjective:    Patient ID: [name], female    DOB: 1965-03-31, 50 y.o.   MRN: [id number]  HPI  This patient was seen today for chronic pain  The medication list was reviewed and updated.   -Compliance with medication: Take as needed.  - Number patient states they take daily: Takes as needed for migraine headaches. States takes 1-2 on occasion.  -when was the last dose patient took? Patient states last Wednesday   The patient was advised the importance of maintaining medication and not using illegal substances with these.  Refills needed: yes   The patient was educated that we can provide 3 monthly scripts for their medication, it is their responsibility to follow the instructions.  Side effects or complications from medications: None   Patient is aware that pain medications are meant to minimize the severity of the pain to allow their pain levels to improve to allow for better function. They are aware of that pain medications cannot totally remove their pain.   Due for UDT ( at least once per year) : N/A  She sees a specialist on a regular basis for injections of her lower back she states her overall joints are hanging in there but she does notice osteoarthritis in her knees.  Patient is not due for any of her blood work currently. She states her sugars have been doing well she's been using Victoza recently but when that runs out she will go to the knee were medicine Bydureon  Blood pressure under decent control   Review of Systems  Constitutional: Negative for activity change, fatigue and fever.  Respiratory: Negative for cough and shortness of breath.   Cardiovascular: Negative for chest pain and leg swelling.  Neurological: Negative for headaches.       Objective:   Physical Exam  Constitutional: She appears well-nourished. No distress.  Cardiovascular: Normal rate, regular rhythm and normal heart sounds.   No murmur heard. Pulmonary/Chest: Effort normal  and breath sounds normal. No respiratory distress.  Musculoskeletal: She exhibits no edema.  Lymphadenopathy:    She has no cervical adenopathy.  Neurological: She is alert. She exhibits normal muscle tone.  Psychiatric: Her behavior is normal.  Vitals reviewed.         Assessment & Plan:  Patient takes her pain medication mainly for her lower back but sometimes osteoarthritis of the knees and occasionally for headache she does not abuse medicine she really only uses about 40 tablets every 6-12 weeks. A new prescription was given for her. She was encouraged to use sparingly and to follow-up if any problems

## 2015-12-16 DIAGNOSIS — M25561 Pain in right knee: Secondary | ICD-10-CM | POA: Diagnosis not present

## 2015-12-27 DIAGNOSIS — M5412 Radiculopathy, cervical region: Secondary | ICD-10-CM | POA: Diagnosis not present

## 2016-01-17 DIAGNOSIS — M545 Low back pain: Secondary | ICD-10-CM | POA: Diagnosis not present

## 2016-01-17 DIAGNOSIS — M5416 Radiculopathy, lumbar region: Secondary | ICD-10-CM | POA: Diagnosis not present

## 2016-01-24 ENCOUNTER — Ambulatory Visit (INDEPENDENT_AMBULATORY_CARE_PROVIDER_SITE_OTHER): Payer: Medicare Other | Admitting: Nurse Practitioner

## 2016-01-24 ENCOUNTER — Encounter: Payer: Self-pay | Admitting: Nurse Practitioner

## 2016-01-24 VITALS — BP 122/80 | Temp 97.7°F | Ht 65.0 in | Wt 224.4 lb

## 2016-01-24 DIAGNOSIS — J329 Chronic sinusitis, unspecified: Secondary | ICD-10-CM

## 2016-01-24 MED ORDER — AZITHROMYCIN 250 MG PO TABS: ORAL_TABLET | ORAL | 0 refills | Status: DC

## 2016-01-24 MED ORDER — METHYLPREDNISOLONE ACETATE 40 MG/ML IJ SUSP
40.0000 mg | Freq: Once | INTRAMUSCULAR | Status: AC
  Administered 2016-01-24: 40 mg via INTRAMUSCULAR

## 2016-01-24 MED ORDER — HYDROCODONE-ACETAMINOPHEN 10-325 MG PO TABS: ORAL_TABLET | ORAL | 0 refills | Status: DC

## 2016-01-24 NOTE — Patient Instructions (Signed)
Rhinocort or Nasacort as directed

## 2016-01-25 ENCOUNTER — Encounter: Payer: Self-pay | Admitting: Nurse Practitioner

## 2016-01-25 NOTE — Progress Notes (Signed)
Subjective:  Presents for c/o headache, runny nose and sore throat that began a couple of days ago. Frontal area headache. Ear pressure. No cough or wheezing. Blood sugars running around 120. Also requesting a refill of her hydrocodone tablets last prescribed in October.  Objective:   BP 122/80   Temp 97.7 F (36.5 C) (Oral)   Ht 5\' 5"  (1.651 m)   Wt 224 lb 6 oz (101.8 kg)   BMI 37.34 kg/m  NAD. Alert, oriented. TMs clear effusion. Pharynx moderate erythema with PND noted. Neck supple with mild anterior adenopathy. Lungs clear. Heart RRR.   Assessment: Rhinosinusitis - Plan: methylPREDNISolone acetate (DEPO-MEDROL) injection 40 mg  Plan:  Meds ordered this encounter  Medications  . azithromycin (ZITHROMAX Z-PAK) 250 MG tablet    Sig: Take 2 tablets (500 mg) on  Day 1,  followed by 1 tablet (250 mg) once daily on Days 2 through 5.    Dispense:  6 each    Refill:  0    Order Specific Question:   Supervising Provider    Answer:   Merlyn AlbertLUKING, WILLIAM S [[id number]]  . HYDROcodone-acetaminophen (NORCO) 10-325 MG tablet    Sig: One q 6 hours prn pain caution drowsiness, use sparingly    Dispense:  40 tablet    Refill:  0    Use sparingly    Order Specific Question:   Supervising Provider    Answer:   Merlyn AlbertLUKING, WILLIAM S [[id number]]  . methylPREDNISolone acetate (DEPO-MEDROL) injection 40 mg   N[facility] reviewed.  OTC meds as directed for cough and congestion. Call back next week if no improvement, sooner if worse.

## 2016-02-13 DIAGNOSIS — M545 Low back pain: Secondary | ICD-10-CM | POA: Diagnosis not present

## 2016-02-21 ENCOUNTER — Encounter (HOSPITAL_COMMUNITY): Payer: Self-pay | Admitting: Physical Therapy

## 2016-02-21 ENCOUNTER — Ambulatory Visit (HOSPITAL_COMMUNITY): Payer: Medicare HMO | Attending: Sports Medicine | Admitting: Physical Therapy

## 2016-02-21 DIAGNOSIS — R2689 Other abnormalities of gait and mobility: Secondary | ICD-10-CM | POA: Insufficient documentation

## 2016-02-21 DIAGNOSIS — M25552 Pain in left hip: Secondary | ICD-10-CM | POA: Diagnosis present

## 2016-02-21 DIAGNOSIS — M6281 Muscle weakness (generalized): Secondary | ICD-10-CM

## 2016-02-21 DIAGNOSIS — M25551 Pain in right hip: Secondary | ICD-10-CM | POA: Insufficient documentation

## 2016-02-21 DIAGNOSIS — M545 Low back pain: Secondary | ICD-10-CM | POA: Insufficient documentation
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
## 2016-02-26 ENCOUNTER — Ambulatory Visit: Payer: Self-pay | Admitting: Family Medicine

## 2016-02-27 ENCOUNTER — Ambulatory Visit (HOSPITAL_COMMUNITY): Payer: Medicare HMO

## 2016-02-28 ENCOUNTER — Ambulatory Visit (HOSPITAL_COMMUNITY): Payer: Medicare HMO | Admitting: Physical Therapy

## 2016-02-28 DIAGNOSIS — M545 Low back pain: Secondary | ICD-10-CM | POA: Diagnosis not present

## 2016-02-28 DIAGNOSIS — M6281 Muscle weakness (generalized): Secondary | ICD-10-CM

## 2016-02-28 DIAGNOSIS — M25552 Pain in left hip: Secondary | ICD-10-CM

## 2016-02-28 DIAGNOSIS — R2689 Other abnormalities of gait and mobility: Secondary | ICD-10-CM

## 2016-02-28 DIAGNOSIS — M25551 Pain in right hip: Secondary | ICD-10-CM
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
## 2016-03-02 ENCOUNTER — Encounter (HOSPITAL_COMMUNITY): Payer: Medicaid Other | Admitting: Physical Therapy

## 2016-03-04 ENCOUNTER — Ambulatory Visit (HOSPITAL_COMMUNITY): Payer: Medicare HMO | Admitting: Physical Therapy

## 2016-03-04 DIAGNOSIS — M545 Low back pain: Secondary | ICD-10-CM

## 2016-03-04 DIAGNOSIS — M25552 Pain in left hip: Secondary | ICD-10-CM

## 2016-03-04 DIAGNOSIS — M25551 Pain in right hip: Secondary | ICD-10-CM

## 2016-03-04 DIAGNOSIS — M6281 Muscle weakness (generalized): Secondary | ICD-10-CM

## 2016-03-04 DIAGNOSIS — R2689 Other abnormalities of gait and mobility: Secondary | ICD-10-CM
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
## 2016-03-06 ENCOUNTER — Ambulatory Visit (HOSPITAL_COMMUNITY): Payer: Medicare HMO | Admitting: Physical Therapy

## 2016-03-06 DIAGNOSIS — M6281 Muscle weakness (generalized): Secondary | ICD-10-CM

## 2016-03-06 DIAGNOSIS — M545 Low back pain: Secondary | ICD-10-CM

## 2016-03-06 DIAGNOSIS — M25551 Pain in right hip: Secondary | ICD-10-CM

## 2016-03-06 DIAGNOSIS — R2689 Other abnormalities of gait and mobility: Secondary | ICD-10-CM

## 2016-03-06 DIAGNOSIS — M25552 Pain in left hip: Secondary | ICD-10-CM
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
## 2016-03-09 ENCOUNTER — Ambulatory Visit (HOSPITAL_COMMUNITY): Payer: Medicare HMO | Admitting: Physical Therapy

## 2016-03-10 ENCOUNTER — Ambulatory Visit: Payer: Self-pay | Admitting: Family Medicine

## 2016-03-13 ENCOUNTER — Ambulatory Visit (HOSPITAL_COMMUNITY): Payer: Medicare HMO | Admitting: Physical Therapy

## 2016-03-16 ENCOUNTER — Ambulatory Visit (HOSPITAL_COMMUNITY): Payer: Medicare HMO | Admitting: Physical Therapy

## 2016-03-18 ENCOUNTER — Ambulatory Visit: Payer: Medicare HMO | Admitting: Gastroenterology

## 2016-03-20 ENCOUNTER — Ambulatory Visit (HOSPITAL_COMMUNITY): Payer: Medicare HMO | Admitting: Physical Therapy

## 2016-03-20 ENCOUNTER — Ambulatory Visit: Payer: Self-pay | Admitting: Family Medicine

## 2016-03-23 ENCOUNTER — Encounter: Payer: Self-pay | Admitting: Family Medicine

## 2016-03-23 ENCOUNTER — Ambulatory Visit (INDEPENDENT_AMBULATORY_CARE_PROVIDER_SITE_OTHER): Payer: Medicare HMO | Admitting: Family Medicine

## 2016-03-23 VITALS — BP 152/94 | Ht 65.0 in | Wt 225.4 lb

## 2016-03-23 DIAGNOSIS — I10 Essential (primary) hypertension: Secondary | ICD-10-CM | POA: Diagnosis not present

## 2016-03-23 DIAGNOSIS — Z13220 Encounter for screening for lipoid disorders: Secondary | ICD-10-CM | POA: Diagnosis not present

## 2016-03-23 DIAGNOSIS — Z79899 Other long term (current) drug therapy: Secondary | ICD-10-CM

## 2016-03-23 DIAGNOSIS — E119 Type 2 diabetes mellitus without complications: Secondary | ICD-10-CM | POA: Diagnosis not present

## 2016-03-23 DIAGNOSIS — G629 Polyneuropathy, unspecified: Secondary | ICD-10-CM | POA: Diagnosis not present

## 2016-03-23 DIAGNOSIS — R5383 Other fatigue: Secondary | ICD-10-CM

## 2016-03-23 LAB — POCT GLYCOSYLATED HEMOGLOBIN (HGB A1C): Hemoglobin A1C: 5.8

## 2016-03-23 MED ORDER — DULOXETINE HCL 20 MG PO CPEP: ORAL_CAPSULE | ORAL | 3 refills | Status: DC

## 2016-03-23 MED ORDER — HYDROCODONE-ACETAMINOPHEN 10-325 MG PO TABS: ORAL_TABLET | ORAL | 0 refills | Status: DC

## 2016-03-23 MED ORDER — PROMETHAZINE HCL 25 MG PO TABS
25.0000 mg | ORAL_TABLET | Freq: Three times a day (TID) | ORAL | 2 refills | Status: DC | PRN
Start: 2016-03-23 — End: 2017-07-20

## 2016-03-23 MED ORDER — LIRAGLUTIDE 18 MG/3ML ~~LOC~~ SOPN: 1.2000 mg | PEN_INJECTOR | Freq: Every day | SUBCUTANEOUS | 6 refills | Status: DC

## 2016-03-23 NOTE — Progress Notes (Signed)
   Subjective:    Patient ID: [name], female    DOB: [birth year], 51 y.o.   MRN: [id number]  Hypertension  This is a chronic problem. The current episode started more than 1 year ago. There are no compliance problems.    Patient would like to discuss a medications for diabetes    pain medication-Patient states she uses medicine intermittently for low back pain and also for severe migraines denies abusing the medication. Patient gets intermittent swelling in the right ankle in the inner aspect and sometimes into the calf but she has to close veins no true PND no orthopnea Patient also gets intermittent tingling into her feet. Denies any particular troubles they are Patient does try to watch salt in her diet she does take her blood pressure medicine on a regular basis Does not excise on a regular basis Her moods overall are doing okay denies being depressed states medicine helps her    antinausea medPatient requests medication be sent in   Results for orders placed or performed in visit on 03/23/16  POCT HgB A1C  Result Value Ref Range   Hemoglobin A1C 5.8      Review of Systems  patient denies any chest tightness pressure pai shotness breanausevmting diarhea does get frequentheadaches aso gets frequentlow bak pa    Objective:   Physical Exam  neck no masses eardrums normal throat is nrmal lungs clear heart eglar regular Extremities no edema      Assessment & Plan:  Blood pressure good control continue current measures  Diabetes excellent control restart Victoza 1.2 daily  Hyperlipidemia check lipid profile await the results  Depression under good control continue Cymbalta  Polyneuropathy in the feet check B12 along with CBC due to fatigue and tiredness may need neurology consult nerve conduction study  Migraines under fairly good control with medication does use hydrocodone intermittently for migraines  Chronic lumbar pain does not radiate into the legs but  uses hydrocodone intermittently averages no more than 15-18 tablets per month prescription for 45 tablets given  Lab work ordered  Follow-up in a proximally 6 months sooner problems

## 2016-03-23 NOTE — Patient Instructions (Addendum)
Diabetes Mellitus and Food It is important for you to manage your blood sugar (glucose) level. Your blood glucose level can be greatly affected by what you eat. Eating healthier foods in the appropriate amounts throughout the day at about the same time each day will help you control your blood glucose level. It can also help slow or prevent worsening of your diabetes mellitus. Healthy eating may even help you improve the level of your blood pressure and reach or maintain a healthy weight. General recommendations for healthful eating and cooking habits include:  Eating meals and snacks regularly. Avoid going long periods of time without eating to lose weight.  Eating a diet that consists mainly of plant-based foods, such as fruits, vegetables, nuts, legumes, and whole grains.  Using low-heat cooking methods, such as baking, instead of high-heat cooking methods, such as deep frying. Work with your dietitian to make sure you understand how to use the Nutrition Facts information on food labels. How can food affect me? Carbohydrates  Carbohydrates affect your blood glucose level more than any other type of food. Your dietitian will help you determine how many carbohydrates to eat at each meal and teach you how to count carbohydrates. Counting carbohydrates is important to keep your blood glucose at a healthy level, especially if you are using insulin or taking certain medicines for diabetes mellitus. Alcohol  Alcohol can cause sudden decreases in blood glucose (hypoglycemia), especially if you use insulin or take certain medicines for diabetes mellitus. Hypoglycemia can be a life-threatening condition. Symptoms of hypoglycemia (sleepiness, dizziness, and disorientation) are similar to symptoms of having too much alcohol. If your health care provider has given you approval to drink alcohol, do so in moderation and use the following guidelines:  Women should not have more than one drink per day, and men  should not have more than two drinks per day. One drink is equal to:  12 oz of beer.  5 oz of wine.  1 oz of hard liquor.  Do not drink on an empty stomach.  Keep yourself hydrated. Have water, diet soda, or unsweetened iced tea.  Regular soda, juice, and other mixers might contain a lot of carbohydrates and should be counted. What foods are not recommended? As you make food choices, it is important to remember that all foods are not the same. Some foods have fewer nutrients per serving than other foods, even though they might have the same number of calories or carbohydrates. It is difficult to get your body what it needs when you eat foods with fewer nutrients. Examples of foods that you should avoid that are high in calories and carbohydrates but low in nutrients include:  Trans fats (most processed foods list trans fats on the Nutrition Facts label).  Regular soda.  Juice.  Candy.  Sweets, such as cake, pie, doughnuts, and cookies.  Fried foods. What foods can I eat? Eat nutrient-rich foods, which will nourish your body and keep you healthy. The food you should eat also will depend on several factors, including:  The calories you need.  The medicines you take.  Your weight.  Your blood glucose level.  Your blood pressure level.  Your cholesterol level. You should eat a variety of foods, including:  Protein.  Lean cuts of meat.  Proteins low in saturated fats, such as fish, egg whites, and beans. Avoid processed meats.  Fruits and vegetables.  Fruits and vegetables that may help control blood glucose levels, such as apples, mangoes, and   yams.  Dairy products.  Choose fat-free or low-fat dairy products, such as milk, yogurt, and cheese.  Grains, bread, pasta, and rice.  Choose whole grain products, such as multigrain bread, whole oats, and brown rice. These foods may help control blood pressure.  Fats.  Foods containing healthful fats, such as nuts,  avocado, olive oil, canola oil, and fish. Does everyone with diabetes mellitus have the same meal plan? Because every person with diabetes mellitus is different, there is not one meal plan that works for everyone. It is very important that you meet with a dietitian who will help you create a meal plan that is just right for you. This information is not intended to replace advice given to you by your health care provider. Make sure you discuss any questions you have with your health care provider. Document Released: 10/23/2004 Document Revised: 07/04/2015 Document Reviewed: 12/23/2012 Elsevier Interactive Patient Education  2017 [employer] DASH Eating Plan DASH stands for "Dietary Approaches to Stop Hypertension." The DASH eating plan is a healthy eating plan that has been shown to reduce high blood pressure (hypertension). Additional health benefits may include reducing the risk of type 2 diabetes mellitus, heart disease, and stroke. The DASH eating plan may also help with weight loss. What do I need to know about the DASH eating plan? For the DASH eating plan, you will follow these general guidelines:  Choose foods with less than 150 milligrams of sodium per serving (as listed on the food label).  Use salt-free seasonings or herbs instead of table salt or sea salt.  Check with your health care provider or pharmacist before using salt substitutes.  Eat lower-sodium products. These are often labeled as "low-sodium" or "no salt added."  Eat fresh foods. Avoid eating a lot of canned foods.  Eat more vegetables, fruits, and low-fat dairy products.  Choose whole grains. Look for the word "whole" as the first word in the ingredient list.  Choose fish and skinless chicken or turkey more often than red meat. Limit fish, poultry, and meat to 6 oz (170 g) each day.  Limit sweets, desserts, sugars, and sugary drinks.  Choose heart-healthy fats.  Eat more home-cooked food and less restaurant,  buffet, and fast food.  Limit fried foods.  Do not fry foods. Cook foods using methods such as baking, boiling, grilling, and broiling instead.  When eating at a restaurant, ask that your food be prepared with less salt, or no salt if possible. What foods can I eat? Seek help from a dietitian for individual calorie needs. Grains  Whole grain or whole wheat bread. Brown rice. Whole grain or whole wheat pasta. Quinoa, bulgur, and whole grain cereals. Low-sodium cereals. Corn or whole wheat flour tortillas. Whole grain cornbread. Whole grain crackers. Low-sodium crackers. Vegetables  Fresh or frozen vegetables (raw, steamed, roasted, or grilled). Low-sodium or reduced-sodium tomato and vegetable juices. Low-sodium or reduced-sodium tomato sauce and paste. Low-sodium or reduced-sodium canned vegetables. Fruits  All fresh, canned (in natural juice), or frozen fruits. Meat and Other Protein Products  Ground beef (85% or leaner), grass-fed beef, or beef trimmed of fat. Skinless chicken or turkey. Ground chicken or turkey. Pork trimmed of fat. All fish and seafood. Eggs. Dried beans, peas, or lentils. Unsalted nuts and seeds. Unsalted canned beans. Dairy  Low-fat dairy products, such as skim or 1% milk, 2% or reduced-fat cheeses, low-fat ricotta or cottage cheese, or plain low-fat yogurt. Low-sodium or reduced-sodium cheeses. Fats and Oils  Tub margarines without   trans fats. Light or reduced-fat mayonnaise and salad dressings (reduced sodium). Avocado. Safflower, olive, or canola oils. Natural peanut or almond butter. Other  Unsalted popcorn and pretzels. The items listed above may not be a complete list of recommended foods or beverages. Contact your dietitian for more options.  What foods are not recommended? Grains  White bread. White pasta. White rice. Refined cornbread. Bagels and croissants. Crackers that contain trans fat. Vegetables  Creamed or fried vegetables. Vegetables in a cheese  sauce. Regular canned vegetables. Regular canned tomato sauce and paste. Regular tomato and vegetable juices. Fruits  Canned fruit in light or heavy syrup. Fruit juice. Meat and Other Protein Products  Fatty cuts of meat. Ribs, chicken wings, bacon, sausage, bologna, salami, chitterlings, fatback, hot dogs, bratwurst, and packaged luncheon meats. Salted nuts and seeds. Canned beans with salt. Dairy  Whole or 2% milk, cream, half-and-half, and cream cheese. Whole-fat or sweetened yogurt. Full-fat cheeses or blue cheese. Nondairy creamers and whipped toppings. Processed cheese, cheese spreads, or cheese curds. Condiments  Onion and garlic salt, seasoned salt, table salt, and sea salt. Canned and packaged gravies. Worcestershire sauce. Tartar sauce. Barbecue sauce. Teriyaki sauce. Soy sauce, including reduced sodium. Steak sauce. Fish sauce. Oyster sauce. Cocktail sauce. Horseradish. Ketchup and mustard. Meat flavorings and tenderizers. Bouillon cubes. Hot sauce. Tabasco sauce. Marinades. Taco seasonings. Relishes. Fats and Oils  Butter, stick margarine, lard, shortening, ghee, and bacon fat. Coconut, palm kernel, or palm oils. Regular salad dressings. Other  Pickles and olives. Salted popcorn and pretzels. The items listed above may not be a complete list of foods and beverages to avoid. Contact your dietitian for more information.  Where can I find more information? National Heart, Lung, and Blood Institute: www.nhlbi.nih.gov/health/health-topics/topics/dash/ This information is not intended to replace advice given to you by your health care provider. Make sure you discuss any questions you have with your health care provider. Document Released: 01/15/2011 Document Revised: 07/04/2015 Document Reviewed: 11/30/2012 Elsevier Interactive Patient Education  2017 [employer] Inc.  

## 2016-03-24 DIAGNOSIS — E119 Type 2 diabetes mellitus without complications: Secondary | ICD-10-CM | POA: Diagnosis not present

## 2016-03-24 DIAGNOSIS — R5383 Other fatigue: Secondary | ICD-10-CM | POA: Diagnosis not present

## 2016-03-24 DIAGNOSIS — G629 Polyneuropathy, unspecified: Secondary | ICD-10-CM | POA: Diagnosis not present

## 2016-03-24 DIAGNOSIS — Z13220 Encounter for screening for lipoid disorders: Secondary | ICD-10-CM | POA: Diagnosis not present

## 2016-03-24 DIAGNOSIS — I10 Essential (primary) hypertension: Secondary | ICD-10-CM | POA: Diagnosis not present

## 2016-03-24 DIAGNOSIS — Z79899 Other long term (current) drug therapy: Secondary | ICD-10-CM | POA: Diagnosis not present

## 2016-03-25 LAB — CBC WITH DIFFERENTIAL/PLATELET
BASOS: 1 %
Basophils Absolute: 0 10*3/uL (ref 0.0–0.2)
EOS (ABSOLUTE): 0.2 10*3/uL (ref 0.0–0.4)
Eos: 4 %
HEMATOCRIT: 39.7 % (ref 34.0–46.6)
HEMOGLOBIN: 12.9 g/dL (ref 11.1–15.9)
IMMATURE GRANS (ABS): 0 10*3/uL (ref 0.0–0.1)
Immature Granulocytes: 0 %
LYMPHS: 33 %
Lymphocytes Absolute: 1.9 10*3/uL (ref 0.7–3.1)
MCH: 29.7 pg (ref 26.6–33.0)
MCHC: 32.5 g/dL (ref 31.5–35.7)
MCV: 92 fL (ref 79–97)
MONOCYTES: 7 %
Monocytes Absolute: 0.4 10*3/uL (ref 0.1–0.9)
Neutrophils Absolute: 3.2 10*3/uL (ref 1.4–7.0)
Neutrophils: 55 %
Platelets: 338 10*3/uL (ref 150–379)
RBC: 4.34 x10E6/uL (ref 3.77–5.28)
RDW: 13.6 % (ref 12.3–15.4)
WBC: 5.7 10*3/uL (ref 3.4–10.8)

## 2016-03-25 LAB — LIPID PANEL
CHOLESTEROL TOTAL: 160 mg/dL (ref 100–199)
Chol/HDL Ratio: 4.3 ratio units (ref 0.0–4.4)
HDL: 37 mg/dL — AB (ref >39)
LDL CALC: 103 mg/dL — AB (ref 0–99)
TRIGLYCERIDES: 99 mg/dL (ref 0–149)
VLDL CHOLESTEROL CAL: 20 mg/dL (ref 5–40)

## 2016-03-25 LAB — HEPATIC FUNCTION PANEL
ALT: 29 IU/L (ref 0–32)
AST: 25 IU/L (ref 0–40)
Albumin: 4.4 g/dL (ref 3.5–5.5)
Alkaline Phosphatase: 80 IU/L (ref 39–117)
BILIRUBIN TOTAL: 0.6 mg/dL (ref 0.0–1.2)
Bilirubin, Direct: 0.17 mg/dL (ref 0.00–0.40)
Total Protein: 7.8 g/dL (ref 6.0–8.5)

## 2016-03-25 LAB — MICROALBUMIN / CREATININE URINE RATIO
Creatinine, Urine: 93.3 mg/dL
MICROALB/CREAT RATIO: 14.8 mg/g{creat} (ref 0.0–30.0)
Microalbumin, Urine: 13.8 ug/mL

## 2016-03-25 LAB — BASIC METABOLIC PANEL
BUN/Creatinine Ratio: 12 (ref 9–23)
BUN: 9 mg/dL (ref 6–24)
CALCIUM: 9.8 mg/dL (ref 8.7–10.2)
CHLORIDE: 92 mmol/L — AB (ref 96–106)
CO2: 27 mmol/L (ref 18–29)
Creatinine, Ser: 0.78 mg/dL (ref 0.57–1.00)
GFR calc non Af Amer: 88 mL/min/{1.73_m2} (ref >59)
GFR, EST AFRICAN AMERICAN: 101 mL/min/{1.73_m2} (ref >59)
Glucose: 101 mg/dL — ABNORMAL HIGH (ref 65–99)
POTASSIUM: 3.9 mmol/L (ref 3.5–5.2)
Sodium: 137 mmol/L (ref 134–144)

## 2016-03-25 LAB — VITAMIN B12: Vitamin B-12: 1321 pg/mL — ABNORMAL HIGH (ref 232–1245)

## 2016-03-26 ENCOUNTER — Encounter: Payer: Self-pay | Admitting: Family Medicine

## 2016-03-27 ENCOUNTER — Other Ambulatory Visit: Payer: Self-pay | Admitting: Family Medicine

## 2016-06-09 ENCOUNTER — Telehealth: Payer: Self-pay | Admitting: Family Medicine

## 2016-06-09 ENCOUNTER — Other Ambulatory Visit: Payer: Self-pay | Admitting: Nurse Practitioner

## 2016-06-09 MED ORDER — LORATADINE 10 MG PO TABS: 10.0000 mg | ORAL_TABLET | Freq: Every day | ORAL | 11 refills | Status: DC

## 2016-06-09 MED ORDER — HYDROCODONE-ACETAMINOPHEN 10-325 MG PO TABS: ORAL_TABLET | ORAL | 0 refills | Status: DC

## 2016-06-09 NOTE — Telephone Encounter (Signed)
The patient may have 30 tablets. Continue original direction for when necessary use, if ongoing need she will need to have an office visit before further prescriptions of this medicine class II medicine

## 2016-06-09 NOTE — Telephone Encounter (Signed)
Pt is requesting a refill on HYDROcodone-acetaminophen (NORCO) 10-325 MG tablet  

## 2016-06-10 NOTE — Telephone Encounter (Signed)
Spoke with patient and informed her that Eber Jones had filled prescription yesterday and Dr.Scott did authorize another refill but will need appointment for any further refills. Patient verbalized understanding.

## 2016-06-10 NOTE — Telephone Encounter (Signed)
Left message return call [name] Refilled medication on 06/09/16)

## 2016-06-29 DIAGNOSIS — G4700 Insomnia, unspecified: Secondary | ICD-10-CM | POA: Diagnosis not present

## 2016-06-29 DIAGNOSIS — Z972 Presence of dental prosthetic device (complete) (partial): Secondary | ICD-10-CM | POA: Diagnosis not present

## 2016-06-29 DIAGNOSIS — Z7984 Long term (current) use of oral hypoglycemic drugs: Secondary | ICD-10-CM | POA: Diagnosis not present

## 2016-06-29 DIAGNOSIS — K08409 Partial loss of teeth, unspecified cause, unspecified class: Secondary | ICD-10-CM | POA: Diagnosis not present

## 2016-06-29 DIAGNOSIS — G43109 Migraine with aura, not intractable, without status migrainosus: Secondary | ICD-10-CM | POA: Diagnosis not present

## 2016-06-29 DIAGNOSIS — R69 Illness, unspecified: Secondary | ICD-10-CM | POA: Diagnosis not present

## 2016-06-29 DIAGNOSIS — M62838 Other muscle spasm: Secondary | ICD-10-CM | POA: Diagnosis not present

## 2016-06-29 DIAGNOSIS — K219 Gastro-esophageal reflux disease without esophagitis: Secondary | ICD-10-CM | POA: Diagnosis not present

## 2016-06-29 DIAGNOSIS — Z7982 Long term (current) use of aspirin: Secondary | ICD-10-CM | POA: Diagnosis not present

## 2016-06-29 DIAGNOSIS — I10 Essential (primary) hypertension: Secondary | ICD-10-CM | POA: Diagnosis not present

## 2016-06-29 DIAGNOSIS — Z0000 Encounter for general adult medical examination without abnormal findings: Secondary | ICD-10-CM | POA: Diagnosis not present

## 2016-06-29 DIAGNOSIS — J309 Allergic rhinitis, unspecified: Secondary | ICD-10-CM | POA: Diagnosis not present

## 2016-06-29 DIAGNOSIS — E119 Type 2 diabetes mellitus without complications: Secondary | ICD-10-CM | POA: Diagnosis not present

## 2016-06-29 DIAGNOSIS — Z79899 Other long term (current) drug therapy: Secondary | ICD-10-CM | POA: Diagnosis not present

## 2016-06-29 DIAGNOSIS — Z6836 Body mass index (BMI) 36.0-36.9, adult: Secondary | ICD-10-CM | POA: Diagnosis not present

## 2016-07-15 DIAGNOSIS — M1711 Unilateral primary osteoarthritis, right knee: Secondary | ICD-10-CM | POA: Diagnosis not present

## 2016-07-16 ENCOUNTER — Ambulatory Visit: Payer: Medicare HMO | Admitting: Family Medicine

## 2016-07-21 ENCOUNTER — Ambulatory Visit (INDEPENDENT_AMBULATORY_CARE_PROVIDER_SITE_OTHER): Payer: Medicare HMO | Admitting: Family Medicine

## 2016-07-21 ENCOUNTER — Other Ambulatory Visit: Payer: Self-pay | Admitting: *Deleted

## 2016-07-21 ENCOUNTER — Encounter: Payer: Self-pay | Admitting: Family Medicine

## 2016-07-21 VITALS — BP 130/80 | Ht 65.0 in | Wt 218.4 lb

## 2016-07-21 DIAGNOSIS — K219 Gastro-esophageal reflux disease without esophagitis: Secondary | ICD-10-CM | POA: Diagnosis not present

## 2016-07-21 DIAGNOSIS — R69 Illness, unspecified: Secondary | ICD-10-CM | POA: Diagnosis not present

## 2016-07-21 DIAGNOSIS — M79671 Pain in right foot: Secondary | ICD-10-CM | POA: Diagnosis not present

## 2016-07-21 DIAGNOSIS — E119 Type 2 diabetes mellitus without complications: Secondary | ICD-10-CM | POA: Diagnosis not present

## 2016-07-21 DIAGNOSIS — I10 Essential (primary) hypertension: Secondary | ICD-10-CM | POA: Diagnosis not present

## 2016-07-21 DIAGNOSIS — G43019 Migraine without aura, intractable, without status migrainosus: Secondary | ICD-10-CM

## 2016-07-21 DIAGNOSIS — F321 Major depressive disorder, single episode, moderate: Secondary | ICD-10-CM | POA: Diagnosis not present

## 2016-07-21 LAB — POCT GLYCOSYLATED HEMOGLOBIN (HGB A1C): Hemoglobin A1C: 5.8

## 2016-07-21 MED ORDER — DULOXETINE HCL 60 MG PO CPEP: ORAL_CAPSULE | ORAL | 5 refills | Status: DC

## 2016-07-21 MED ORDER — RANITIDINE HCL 300 MG PO TABS: 300.0000 mg | ORAL_TABLET | Freq: Every day | ORAL | 6 refills | Status: DC

## 2016-07-21 NOTE — Progress Notes (Signed)
Left message for pt to return call. Order for right foot xray put in

## 2016-07-21 NOTE — Progress Notes (Signed)
   Subjective:    Patient ID: [name], female    DOB: October 18, 1965, 51 y.o.   MRN: [id number]  Diabetes  She presents for her follow-up diabetic visit. She has type 2 diabetes mellitus. Pertinent negatives for hypoglycemia include no confusion. Pertinent negatives for diabetes include no chest pain, no fatigue, no polydipsia, no polyphagia and no weakness. She has not had a previous visit with a dietitian. She does not see a podiatrist.Eye exam is not current.   Patient finds herself having more trouble with depression related related issues feeling sad feeling down not as active not as involved not suicidal.  Patient has concerns of pain to left side of right foot. Intermittent pain on the right side of her foot when she walks denies any injury to it   Also has concerns of fatigue, and weak legs. Relates a fair amount of fatigue tiredness but does not do any exercise on a regular basis does not always eat healthy  Results for orders placed or performed in visit on 07/21/16  POCT HgB A1C  Result Value Ref Range   Hemoglobin A1C 5.8     Review of Systems  Constitutional: Negative for activity change, appetite change and fatigue.  HENT: Negative for congestion.   Respiratory: Negative for cough.   Cardiovascular: Negative for chest pain.  Gastrointestinal: Negative for abdominal pain.  Endocrine: Negative for polydipsia and polyphagia.  Neurological: Negative for weakness.  Psychiatric/Behavioral: Negative for confusion.       Objective:   Physical Exam  Constitutional: She appears well-nourished. No distress.  Cardiovascular: Normal rate, regular rhythm and normal heart sounds.   No murmur heard. Pulmonary/Chest: Effort normal and breath sounds normal. No respiratory distress.  Musculoskeletal: She exhibits no edema.  Lymphadenopathy:    She has no cervical adenopathy.  Neurological: She is alert. She exhibits normal muscle tone.  Psychiatric:  Depression see PHQ9    Vitals reviewed.   25 minutes was spent with the patient. Greater than half the time was spent in discussion and answering questions and counseling regarding the issues that the patient came in for today.       Assessment & Plan:  Major depression increase Cymbalta patient does not want counseling patient not suicidal follow-up in one month to see how she is doing  Blood pressure continue current medication watch diet exercise recheck blood pressure in one month may need adjusting  Migraines probably related to the increased depression continue medication watch diet  Diabetes good control continue current measures watch diet stay physically active  I think fatigue in leg weakness is related to lack of activity hopefully treating depression will get things doing better may need to do testing if ongoing troubles  Reflux refill on ranitidine given follow-up if ongoing troubles.  Foot pain will go ahead and do x-ray to rule out stress fracture

## 2016-07-21 NOTE — Patient Instructions (Signed)
DASH Eating Plan DASH stands for "Dietary Approaches to Stop Hypertension." The DASH eating plan is a healthy eating plan that has been shown to reduce high blood pressure (hypertension). It may also reduce your risk for type 2 diabetes, heart disease, and stroke. The DASH eating plan may also help with weight loss. What are tips for following this plan? General guidelines  Avoid eating more than 2,300 mg (milligrams) of salt (sodium) a day. If you have hypertension, you may need to reduce your sodium intake to 1,500 mg a day.  Limit alcohol intake to no more than 1 drink a day for nonpregnant women and 2 drinks a day for men. One drink equals 12 oz of beer, 5 oz of wine, or 1 oz of hard liquor.  Work with your health care provider to maintain a healthy body weight or to lose weight. Ask what an ideal weight is for you.  Get at least 30 minutes of exercise that causes your heart to beat faster (aerobic exercise) most days of the week. Activities may include walking, swimming, or biking.  Work with your health care provider or diet and nutrition specialist (dietitian) to adjust your eating plan to your individual calorie needs. Reading food labels  Check food labels for the amount of sodium per serving. Choose foods with less than 5 percent of the Daily Value of sodium. Generally, foods with less than 300 mg of sodium per serving fit into this eating plan.  To find whole grains, look for the word "whole" as the first word in the ingredient list. Shopping  Buy products labeled as "low-sodium" or "no salt added."  Buy fresh foods. Avoid canned foods and premade or frozen meals. Cooking  Avoid adding salt when cooking. Use salt-free seasonings or herbs instead of table salt or sea salt. Check with your health care provider or pharmacist before using salt substitutes.  Do not fry foods. Cook foods using healthy methods such as baking, boiling, grilling, and broiling instead.  Cook with  heart-healthy oils, such as olive, canola, soybean, or sunflower oil. Meal planning   Eat a balanced diet that includes: ? 5 or more servings of fruits and vegetables each day. At each meal, try to fill half of your plate with fruits and vegetables. ? Up to 6-8 servings of whole grains each day. ? Less than 6 oz of lean meat, poultry, or fish each day. A 3-oz serving of meat is about the same size as a deck of cards. One egg equals 1 oz. ? 2 servings of low-fat dairy each day. ? A serving of nuts, seeds, or beans 5 times each week. ? Heart-healthy fats. Healthy fats called Omega-3 fatty acids are found in foods such as flaxseeds and coldwater fish, like sardines, salmon, and mackerel.  Limit how much you eat of the following: ? Canned or prepackaged foods. ? Food that is high in trans fat, such as fried foods. ? Food that is high in saturated fat, such as fatty meat. ? Sweets, desserts, sugary drinks, and other foods with added sugar. ? Full-fat dairy products.  Do not salt foods before eating.  Try to eat at least 2 vegetarian meals each week.  Eat more home-cooked food and less restaurant, buffet, and fast food.  When eating at a restaurant, ask that your food be prepared with less salt or no salt, if possible. What foods are recommended? The items listed may not be a complete list. Talk with your dietitian about what   dietary choices are best for you. Grains Whole-grain or whole-wheat bread. Whole-grain or whole-wheat pasta. Brown rice. Oatmeal. Quinoa. Bulgur. Whole-grain and low-sodium cereals. Pita bread. Low-fat, low-sodium crackers. Whole-wheat flour tortillas. Vegetables Fresh or frozen vegetables (raw, steamed, roasted, or grilled). Low-sodium or reduced-sodium tomato and vegetable juice. Low-sodium or reduced-sodium tomato sauce and tomato paste. Low-sodium or reduced-sodium canned vegetables. Fruits All fresh, dried, or frozen fruit. Canned fruit in natural juice (without  added sugar). Meat and other protein foods Skinless chicken or turkey. Ground chicken or turkey. Pork with fat trimmed off. Fish and seafood. Egg whites. Dried beans, peas, or lentils. Unsalted nuts, nut butters, and seeds. Unsalted canned beans. Lean cuts of beef with fat trimmed off. Low-sodium, lean deli meat. Dairy Low-fat (1%) or fat-free (skim) milk. Fat-free, low-fat, or reduced-fat cheeses. Nonfat, low-sodium ricotta or cottage cheese. Low-fat or nonfat yogurt. Low-fat, low-sodium cheese. Fats and oils Soft margarine without trans fats. Vegetable oil. Low-fat, reduced-fat, or light mayonnaise and salad dressings (reduced-sodium). Canola, safflower, olive, soybean, and sunflower oils. Avocado. Seasoning and other foods Herbs. Spices. Seasoning mixes without salt. Unsalted popcorn and pretzels. Fat-free sweets. What foods are not recommended? The items listed may not be a complete list. Talk with your dietitian about what dietary choices are best for you. Grains Baked goods made with fat, such as croissants, muffins, or some breads. Dry pasta or rice meal packs. Vegetables Creamed or fried vegetables. Vegetables in a cheese sauce. Regular canned vegetables (not low-sodium or reduced-sodium). Regular canned tomato sauce and paste (not low-sodium or reduced-sodium). Regular tomato and vegetable juice (not low-sodium or reduced-sodium). Pickles. Olives. Fruits Canned fruit in a light or heavy syrup. Fried fruit. Fruit in cream or butter sauce. Meat and other protein foods Fatty cuts of meat. Ribs. Fried meat. Bacon. Sausage. Bologna and other processed lunch meats. Salami. Fatback. Hotdogs. Bratwurst. Salted nuts and seeds. Canned beans with added salt. Canned or smoked fish. Whole eggs or egg yolks. Chicken or turkey with skin. Dairy Whole or 2% milk, cream, and half-and-half. Whole or full-fat cream cheese. Whole-fat or sweetened yogurt. Full-fat cheese. Nondairy creamers. Whipped toppings.  Processed cheese and cheese spreads. Fats and oils Butter. Stick margarine. Lard. Shortening. Ghee. Bacon fat. Tropical oils, such as coconut, palm kernel, or palm oil. Seasoning and other foods Salted popcorn and pretzels. Onion salt, garlic salt, seasoned salt, table salt, and sea salt. Worcestershire sauce. Tartar sauce. Barbecue sauce. Teriyaki sauce. Soy sauce, including reduced-sodium. Steak sauce. Canned and packaged gravies. Fish sauce. Oyster sauce. Cocktail sauce. Horseradish that you find on the shelf. Ketchup. Mustard. Meat flavorings and tenderizers. Bouillon cubes. Hot sauce and Tabasco sauce. Premade or packaged marinades. Premade or packaged taco seasonings. Relishes. Regular salad dressings. Where to find more information:  National Heart, Lung, and Blood Institute: www.nhlbi.nih.gov  American Heart Association: www.heart.org Summary  The DASH eating plan is a healthy eating plan that has been shown to reduce high blood pressure (hypertension). It may also reduce your risk for type 2 diabetes, heart disease, and stroke.  With the DASH eating plan, you should limit salt (sodium) intake to 2,300 mg a day. If you have hypertension, you may need to reduce your sodium intake to 1,500 mg a day.  When on the DASH eating plan, aim to eat more fresh fruits and vegetables, whole grains, lean proteins, low-fat dairy, and heart-healthy fats.  Work with your health care provider or diet and nutrition specialist (dietitian) to adjust your eating plan to your individual   calorie needs. This information is not intended to replace advice given to you by your health care provider. Make sure you discuss any questions you have with your health care provider. Document Released: 01/15/2011 Document Revised: 01/20/2016 Document Reviewed: 01/20/2016 Elsevier Interactive Patient Education  2017 Elsevier Inc.  

## 2016-07-21 NOTE — Addendum Note (Signed)
Addended by: Theodora BlowREWS, Phinneas Shakoor R on: 07/21/2016 03:40 PM   Modules accepted: Orders

## 2016-07-21 NOTE — Progress Notes (Signed)
Left message return call 07/21/16 

## 2016-07-22 ENCOUNTER — Telehealth: Payer: Self-pay | Admitting: *Deleted

## 2016-07-22 NOTE — Telephone Encounter (Signed)
Left message return 07/22/2016

## 2016-07-22 NOTE — Telephone Encounter (Signed)
Left message to return call  Luking, Jonna CoupScott A, MD  [facility]        Please let patient know that I intended for her to do x-ray of her right foot rule out a stress fracture, please order x-ray the foot then inform the patient to get this done somewhere within the next 10 days-thanks

## 2016-07-23 NOTE — Telephone Encounter (Signed)
Spoke with patient and informed her per Dr.Scott Luking Xray was ordered for your right foot to rule out stress fracture. Xray needs to be completed in the next 10 days. Patient verbalized understanding.

## 2016-07-27 ENCOUNTER — Ambulatory Visit (HOSPITAL_COMMUNITY)
Admission: RE | Admit: 2016-07-27 | Discharge: 2016-07-27 | Disposition: A | Discharge status: Home / Self Care | Payer: Medicare HMO | Source: Ambulatory Visit | Attending: Family Medicine | Admitting: Family Medicine

## 2016-07-27 DIAGNOSIS — M79671 Pain in right foot: Secondary | ICD-10-CM | POA: Insufficient documentation

## 2016-07-29 ENCOUNTER — Other Ambulatory Visit: Payer: Self-pay | Admitting: *Deleted

## 2016-07-29 DIAGNOSIS — M79671 Pain in right foot: Secondary | ICD-10-CM

## 2016-08-05 ENCOUNTER — Encounter: Payer: Self-pay | Admitting: Family Medicine

## 2016-08-11 DIAGNOSIS — M1711 Unilateral primary osteoarthritis, right knee: Secondary | ICD-10-CM | POA: Diagnosis not present

## 2016-08-18 ENCOUNTER — Ambulatory Visit: Payer: Medicare HMO | Admitting: Family Medicine

## 2016-08-18 DIAGNOSIS — M1711 Unilateral primary osteoarthritis, right knee: Secondary | ICD-10-CM | POA: Diagnosis not present

## 2016-08-20 ENCOUNTER — Encounter: Payer: Self-pay | Admitting: Family Medicine

## 2016-08-24 DIAGNOSIS — M79671 Pain in right foot: Secondary | ICD-10-CM | POA: Diagnosis not present

## 2016-08-24 DIAGNOSIS — S92301A Fracture of unspecified metatarsal bone(s), right foot, initial encounter for closed fracture: Secondary | ICD-10-CM | POA: Diagnosis not present

## 2016-08-24 DIAGNOSIS — M7670 Peroneal tendinitis, unspecified leg: Secondary | ICD-10-CM | POA: Diagnosis not present

## 2016-08-25 DIAGNOSIS — M1711 Unilateral primary osteoarthritis, right knee: Secondary | ICD-10-CM | POA: Diagnosis not present

## 2016-08-28 ENCOUNTER — Emergency Department (HOSPITAL_COMMUNITY)
Admission: EM | Admit: 2016-08-28 | Discharge: 2016-08-28 | Disposition: A | Discharge status: Home / Self Care | Payer: Medicare HMO | Attending: Emergency Medicine | Admitting: Emergency Medicine

## 2016-08-28 ENCOUNTER — Encounter (HOSPITAL_COMMUNITY): Payer: Self-pay

## 2016-08-28 DIAGNOSIS — M5442 Lumbago with sciatica, left side: Secondary | ICD-10-CM

## 2016-08-28 DIAGNOSIS — E119 Type 2 diabetes mellitus without complications: Secondary | ICD-10-CM | POA: Diagnosis not present

## 2016-08-28 DIAGNOSIS — Z7982 Long term (current) use of aspirin: Secondary | ICD-10-CM | POA: Insufficient documentation

## 2016-08-28 DIAGNOSIS — I10 Essential (primary) hypertension: Secondary | ICD-10-CM | POA: Insufficient documentation

## 2016-08-28 DIAGNOSIS — Z79899 Other long term (current) drug therapy: Secondary | ICD-10-CM | POA: Diagnosis not present

## 2016-08-28 DIAGNOSIS — M545 Low back pain: Secondary | ICD-10-CM | POA: Diagnosis present

## 2016-08-28 DIAGNOSIS — M5441 Lumbago with sciatica, right side: Secondary | ICD-10-CM | POA: Insufficient documentation

## 2016-08-28 MED ORDER — METHYLPREDNISOLONE SODIUM SUCC 125 MG IJ SOLR
125.0000 mg | Freq: Once | INTRAMUSCULAR | Status: AC
  Administered 2016-08-28: 125 mg via INTRAMUSCULAR
  Filled 2016-08-28: qty 2

## 2016-08-28 MED ORDER — PREDNISONE 10 MG PO TABS: ORAL_TABLET | ORAL | 0 refills | Status: DC

## 2016-08-28 MED ORDER — HYDROCODONE-ACETAMINOPHEN 5-325 MG PO TABS: 2.0000 | ORAL_TABLET | ORAL | 0 refills | Status: DC | PRN

## 2016-08-28 MED ORDER — KETOROLAC TROMETHAMINE 60 MG/2ML IM SOLN
60.0000 mg | Freq: Once | INTRAMUSCULAR | Status: AC
  Administered 2016-08-28: 60 mg via INTRAMUSCULAR
  Filled 2016-08-28: qty 2

## 2016-08-28 NOTE — Discharge Instructions (Signed)
See your Physician on Monday for recheck

## 2016-08-28 NOTE — ED Notes (Signed)
Pt alert & oriented x4, stable gait. Patient given discharge instructions, paperwork & prescription(s). Patient informed not to drive, operate any equipment & handel any important documents 4 hours after taking pain medication. Patient verbalized understanding. Pt left department in wheelchair escorted by staff. Pt left w/ no further questions. 

## 2016-08-28 NOTE — ED Notes (Signed)
Pt C/O left leg pain, pain shoots down the leg. Pt denies any injury. Has been using OTC meds & heat, w/o relief.

## 2016-08-28 NOTE — ED Triage Notes (Signed)
Lower back pain for 1 1/2 week with radiation to left hip, leg and tingling in left foot. Pt report injury to back one year ago.

## 2016-08-29 NOTE — ED Provider Notes (Signed)
[facility] Provider Note   CSN: [id number] Arrival date & time: 08/28/16  1727     History   Chief Complaint Chief Complaint  Patient presents with  . Back Pain    HPI HODA HON is a 51 y.o. female.  The history is provided by the patient. No language interpreter was used.  Back Pain   This is a new problem. The current episode started more than 1 week ago. The problem occurs constantly. The problem has been gradually worsening. The pain is present in the lumbar spine. The quality of the pain is described as aching. The pain is moderate. The symptoms are aggravated by bending. The pain is worse during the day. Pertinent negatives include no chest pain. She has tried nothing for the symptoms. The treatment provided no relief.  Pt complains of chronic pain.  Pt reports pain has been worse for the past few days.  Pt is out of her pain medications  Past Medical History:  Diagnosis Date  . Allergy   . Anxiety   . Arthritis   . Diabetes mellitus without complication (HCC)   . Folliculitis 07/20/2012  . Hidradenitis suppurativa   . Hypertension   . Migraine headache   . Pulmonary nodule   . Sleep apnea     Patient Active Problem List   Diagnosis Date Noted  . Depression, major, single episode, moderate (HCC) 07/21/2016  . Chronic bilateral low back pain 12/02/2015  . Dysphagia 09/11/2015  . Gastroesophageal reflux disease without esophagitis 07/24/2015  . Colon cancer screening   . Encounter for screening colonoscopy 03/14/2015  . Insomnia 11/26/2014  . Varicose veins 11/26/2014  . Osteoarthritis of both knees 08/28/2014  . Sleep apnea 05/25/2014  . Depression 02/04/2014  . Skin abscess 12/01/2012  . Hidradenitis 08/03/2012  . Diabetes (HCC) 07/20/2012  . Folliculitis 07/20/2012  . Migraine 06/04/2008  . Essential hypertension 06/04/2008  . CONSTIPATION, CHRONIC 06/04/2008  . Chronic arthralgias of knees and hips 06/04/2008  . ABDOMINAL PAIN  06/04/2008    Past Surgical History:  Procedure Laterality Date  . ABDOMINAL HYSTERECTOMY    . COLONOSCOPY WITH PROPOFOL N/A 04/08/2015   Dr. Jena Gauss: normal, repeat in 10 years   . ENDOMETRIAL ABLATION    . FOREIGN BODY REMOVAL N/A 05/26/2013   Procedure: FOREIGN BODY REMOVAL ADULT ABDOMINAL WALL;  Surgeon: Dalia Heading, MD;  Location: [facility];  Service: General;  Laterality: N/A;  . KNEE SURGERY Right 2012  . PILONIDAL CYST EXCISION  June 2011    OB History    Gravida Para Term Preterm AB Living   1 1       1    SAB TAB Ectopic Multiple Live Births           1       Home Medications    Prior to Admission medications   Medication Sig Start Date End Date Taking? Authorizing Provider  aspirin EC 81 MG tablet Take 81 mg by mouth daily.   Yes [provider]  cyclobenzaprine (FLEXERIL) 10 MG tablet Take 1 tablet (10 mg total) by mouth 3 (three) times daily as needed for muscle spasms. 06/17/15  Yes Pollina, Canary Brim, MD  docusate sodium (COLACE) 100 MG capsule Take 100 mg by mouth daily as needed for mild constipation.   Yes [provider]  DULoxetine (CYMBALTA) 60 MG capsule 2 po qd-stop lexapro Patient taking differently: Take 120 mg by mouth daily.  07/21/16  Yes Luking, Jonna Coup,  MD  fluticasone (FLONASE) 50 MCG/ACT nasal spray Place 2 sprays into both nostrils daily as needed for allergies or rhinitis. 08/28/14  Yes Babs SciaraLuking, Scott A, MD  linaclotide (LINZESS) 145 MCG CAPS capsule Take 1 capsule (145 mcg total) by mouth daily. 09/11/15  Yes Gelene MinkBoone, Anna W, NP  liraglutide (VICTOZA) 18 MG/3ML SOPN Inject 0.2 mLs (1.2 mg total) into the skin daily. 03/23/16  Yes Babs SciaraLuking, Scott A, MD  loratadine (CLARITIN) 10 MG tablet Take 1 tablet (10 mg total) by mouth daily. 06/09/16  Yes Campbell RichesHoskins, Carolyn C, NP  metoprolol (LOPRESSOR) 100 MG tablet Take 1 tablet (100 mg total) by mouth 2 (two) times daily. Patient taking differently: Take 100 mg by mouth daily.  08/26/15  Yes Babs SciaraLuking,  Scott A, MD  promethazine (PHENERGAN) 25 MG tablet Take 1 tablet (25 mg total) by mouth every 8 (eight) hours as needed for nausea or vomiting. 03/23/16  Yes Luking, Jonna CoupScott A, MD  ranitidine (ZANTAC) 300 MG tablet Take 1 tablet (300 mg total) by mouth at bedtime. 07/21/16  Yes Babs SciaraLuking, Scott A, MD  traZODone (DESYREL) 100 MG tablet Take 1 tablet (100 mg total) by mouth at bedtime. 08/26/15  Yes Luking, Scott A, MD  triamcinolone cream (KENALOG) 0.1 % APPLY ONE APPLICATION TOPICALLY TWICE DAILY AS NEEDED FOR RASH; USE UP TO 2 WEEKS 01/30/14  Yes Merlyn AlbertLuking, William S, MD  triamterene-hydrochlorothiazide (MAXZIDE-25) 37.5-25 MG tablet Take 1 tablet by mouth daily. 08/26/15  Yes Babs SciaraLuking, Scott A, MD  HYDROcodone-acetaminophen (NORCO/VICODIN) 5-325 MG tablet Take 2 tablets by mouth every 4 (four) hours as needed. 08/28/16   Elson AreasSofia, Leslie K, PA-C  predniSONE (DELTASONE) 10 MG tablet 6,5,4,3,2,1 taper 08/28/16   Elson AreasSofia, Leslie K, PA-C  ULTICARE PEN NEEDLES 29G X 12MM MISC USE DAILY AS DIRECTED FOR VICTOZA PEN 03/27/16   Babs SciaraLuking, Scott A, MD    Family History Family History  Problem Relation Age of Onset  . Stroke Mother   . Hypertension Mother   . Thyroid disease Father   . Hypertension Sister   . Hypertension Brother   . Cancer Paternal Grandfather        prostate  . Colon cancer Maternal Grandfather     Social History Social History  Substance Use Topics  . Smoking status: Never Smoker  . Smokeless tobacco: Never Used  . Alcohol use No     Allergies   Bactrim [sulfamethoxazole-trimethoprim]; Lisinopril; and Other   Review of Systems Review of Systems  Cardiovascular: Negative for chest pain.  Musculoskeletal: Positive for back pain.  All other systems reviewed and are negative.    Physical Exam Updated Vital Signs BP 91/72 (BP Location: Left Arm)   Pulse 82   Temp 98.6 F (37 C)   Resp 18   Ht 5\' 8"  (1.727 m)   Wt 99.8 kg (220 lb)   SpO2 100%   BMI 33.45 kg/m   Physical Exam    Constitutional: She appears well-developed and well-nourished.  Cardiovascular: Normal rate.   Pulmonary/Chest: Effort normal.  Abdominal: Soft.  Musculoskeletal: She exhibits tenderness.  Tender low back,  Pain with moving.  Neurological: She is alert.  Skin: Skin is warm.  Psychiatric: She has a normal mood and affect.  Nursing note and vitals reviewed.    ED Treatments / Results  Labs (all labs ordered are listed, but only abnormal results are displayed) Labs Reviewed - No data to display  EKG  EKG Interpretation None       Radiology No  results found.  Procedures Procedures (including critical care time)  Medications Ordered in ED Medications  methylPREDNISolone sodium succinate (SOLU-MEDROL) 125 mg/2 mL injection 125 mg (125 mg Intramuscular Given 08/28/16 1946)  ketorolac (TORADOL) injection 60 mg (60 mg Intramuscular Given 08/28/16 1947)     Initial Impression / Assessment and Plan / ED Course  I have reviewed the triage vital signs and the nursing notes.  Pertinent labs & imaging results that were available during my care of the patient were reviewed by me and considered in my medical decision making (see chart for details).     [place] database reviewed.  Pt has had sporadic pain medication from her MD.  No current RX.  Pt advised I will give limited amount.  Pt will contact her MD on Monday  Final Clinical Impressions(s) / ED Diagnoses   Final diagnoses:  Right-sided low back pain with left-sided sciatica, unspecified chronicity    New Prescriptions Discharge Medication List as of 08/28/2016  7:57 PM    START taking these medications   Details  HYDROcodone-acetaminophen (NORCO/VICODIN) 5-325 MG tablet Take 2 tablets by mouth every 4 (four) hours as needed., Starting Fri 08/28/2016, Print    predniSONE (DELTASONE) 10 MG tablet 6,5,4,3,2,1 taper, Print      An After Visit Summary was printed and given to the patient.    Elson Areas, PA-C 08/29/16  1610    Benjiman Core, MD 08/29/16 [phone]

## 2016-09-08 DIAGNOSIS — M545 Low back pain: Secondary | ICD-10-CM | POA: Diagnosis not present

## 2016-09-12 DIAGNOSIS — M545 Low back pain: Secondary | ICD-10-CM | POA: Diagnosis not present

## 2016-09-14 DIAGNOSIS — M7670 Peroneal tendinitis, unspecified leg: Secondary | ICD-10-CM | POA: Diagnosis not present

## 2016-09-14 DIAGNOSIS — M79671 Pain in right foot: Secondary | ICD-10-CM | POA: Diagnosis not present

## 2016-09-14 DIAGNOSIS — S92301A Fracture of unspecified metatarsal bone(s), right foot, initial encounter for closed fracture: Secondary | ICD-10-CM | POA: Diagnosis not present

## 2016-09-23 ENCOUNTER — Other Ambulatory Visit: Payer: Self-pay | Admitting: Nurse Practitioner

## 2016-09-23 ENCOUNTER — Telehealth: Payer: Self-pay | Admitting: Nurse Practitioner

## 2016-09-23 MED ORDER — HYDROCODONE-ACETAMINOPHEN 5-325 MG PO TABS: 1.0000 | ORAL_TABLET | ORAL | 0 refills | Status: DC | PRN

## 2016-09-23 NOTE — Telephone Encounter (Signed)
Patient in with her grandson today. Requesting refill on Hydrocodone. Most recent was 10 pills given to her by ED. Takes sparingly but increased lately due to back pain. Will be seeing specialist soon for back injections. Recommend office visit and work up if she will be on continuous pain med. NCCSR reviewed.

## 2016-10-09 DIAGNOSIS — M545 Low back pain: Secondary | ICD-10-CM | POA: Diagnosis not present

## 2016-10-09 DIAGNOSIS — M5416 Radiculopathy, lumbar region: Secondary | ICD-10-CM | POA: Diagnosis not present

## 2016-10-21 ENCOUNTER — Other Ambulatory Visit: Payer: Self-pay | Admitting: Family Medicine

## 2016-10-21 ENCOUNTER — Other Ambulatory Visit: Payer: Self-pay | Admitting: Gastroenterology

## 2016-10-27 DIAGNOSIS — M5416 Radiculopathy, lumbar region: Secondary | ICD-10-CM | POA: Diagnosis not present

## 2016-10-27 DIAGNOSIS — M545 Low back pain: Secondary | ICD-10-CM | POA: Diagnosis not present

## 2016-11-06 DIAGNOSIS — M545 Low back pain: Secondary | ICD-10-CM | POA: Diagnosis not present

## 2016-11-06 DIAGNOSIS — M5416 Radiculopathy, lumbar region: Secondary | ICD-10-CM | POA: Diagnosis not present

## 2016-11-13 ENCOUNTER — Telehealth: Payer: Self-pay | Admitting: Family Medicine

## 2016-11-13 NOTE — Telephone Encounter (Signed)
Pt is requesting a refill on HYDROcodone-acetaminophen (NORCO/VICODIN) 5-325 MG tablet   

## 2016-11-13 NOTE — Telephone Encounter (Signed)
Pt requesting Hydrocodone. She was see last here on 07/21/2016 for DM management,seen in ED on 08/28/2016 they gave her 10 pills,then you gave her # 40 on 09/23/2016. Please advise.Thanks,CS

## 2016-11-19 ENCOUNTER — Other Ambulatory Visit: Payer: Self-pay | Admitting: Family Medicine

## 2016-11-19 DIAGNOSIS — Z1231 Encounter for screening mammogram for malignant neoplasm of breast: Secondary | ICD-10-CM

## 2016-11-20 ENCOUNTER — Other Ambulatory Visit: Payer: Self-pay | Admitting: Nurse Practitioner

## 2016-11-20 MED ORDER — HYDROCODONE-ACETAMINOPHEN 5-325 MG PO TABS: 1.0000 | ORAL_TABLET | ORAL | 0 refills | Status: DC | PRN

## 2016-11-20 NOTE — Telephone Encounter (Signed)
Left message for pt to return call to let her know rx is up front ready for pickup.

## 2016-11-20 NOTE — Telephone Encounter (Signed)
Prescription printed. Please let patient know.   Note: NCCSR reviewed

## 2016-11-23 DIAGNOSIS — M545 Low back pain: Secondary | ICD-10-CM | POA: Diagnosis not present

## 2016-11-23 DIAGNOSIS — M5416 Radiculopathy, lumbar region: Secondary | ICD-10-CM | POA: Diagnosis not present

## 2016-11-28 DIAGNOSIS — R69 Illness, unspecified: Secondary | ICD-10-CM | POA: Diagnosis not present

## 2016-12-04 ENCOUNTER — Ambulatory Visit
Admission: RE | Admit: 2016-12-04 | Discharge: 2016-12-04 | Disposition: A | Discharge status: Home / Self Care | Payer: Medicare HMO | Source: Ambulatory Visit | Attending: Family Medicine | Admitting: Family Medicine

## 2016-12-04 DIAGNOSIS — Z1231 Encounter for screening mammogram for malignant neoplasm of breast: Secondary | ICD-10-CM

## 2016-12-08 ENCOUNTER — Other Ambulatory Visit: Payer: Self-pay | Admitting: Family Medicine

## 2016-12-08 DIAGNOSIS — R928 Other abnormal and inconclusive findings on diagnostic imaging of breast: Secondary | ICD-10-CM

## 2016-12-11 ENCOUNTER — Encounter: Payer: Self-pay | Admitting: Nurse Practitioner

## 2016-12-11 ENCOUNTER — Ambulatory Visit (INDEPENDENT_AMBULATORY_CARE_PROVIDER_SITE_OTHER): Payer: Medicare HMO | Admitting: Nurse Practitioner

## 2016-12-11 VITALS — BP 120/80 | Temp 98.1°F | Wt 220.5 lb

## 2016-12-11 DIAGNOSIS — B349 Viral infection, unspecified: Secondary | ICD-10-CM | POA: Diagnosis not present

## 2016-12-11 DIAGNOSIS — R062 Wheezing: Secondary | ICD-10-CM

## 2016-12-11 MED ORDER — PREDNISONE 20 MG PO TABS: ORAL_TABLET | ORAL | 0 refills | Status: DC

## 2016-12-11 MED ORDER — AZITHROMYCIN 250 MG PO TABS: ORAL_TABLET | ORAL | 0 refills | Status: DC

## 2016-12-11 MED ORDER — ALBUTEROL SULFATE HFA 108 (90 BASE) MCG/ACT IN AERS: 2.0000 | INHALATION_SPRAY | RESPIRATORY_TRACT | 0 refills | Status: DC | PRN

## 2016-12-11 MED ORDER — HYDROCODONE-HOMATROPINE 5-1.5 MG/5ML PO SYRP: 5.0000 mL | ORAL_SOLUTION | ORAL | 0 refills | Status: DC | PRN

## 2016-12-11 MED ORDER — FLUTICASONE PROPIONATE 50 MCG/ACT NA SUSP: 2.0000 | Freq: Every day | NASAL | 5 refills | Status: DC | PRN

## 2016-12-12 ENCOUNTER — Encounter: Payer: Self-pay | Admitting: Nurse Practitioner

## 2016-12-12 NOTE — Progress Notes (Signed)
Subjective: Presents for complaints of sore throat runny nose headache and cough that began yesterday.  No documented fever but some sweating.  Frequent cough producing thick cloudy mucus.  Slight wheeze at times.  No ear pain.  No vomiting diarrhea or abdominal pain.  Taking fluids well.  Voiding normal limit.  Blood sugars have been fine.  Got very little rest last night due to cough.  Objective:   BP 120/80   Temp 98.1 F (36.7 C) (Oral)   Wt 220 lb 8 oz (100 kg)   BMI 33.53 kg/m  NAD.  Alert, oriented.  TMs clear effusion, no erythema.  Pharynx injected with cloudy PND noted.  Neck supple with mild soft anterior adenopathy.  Lungs clear.  Heart regular rate and rhythm.  Frequent nonproductive cough noted.  Assessment:  Viral illness  Wheezing    Plan:   Meds ordered this encounter  Medications  . LYRICA 75 MG capsule  . azithromycin (ZITHROMAX Z-PAK) 250 MG tablet    Sig: Take 2 tablets (500 mg) on  Day 1,  followed by 1 tablet (250 mg) once daily on Days 2 through 5.    Dispense:  6 each    Refill:  0    Order Specific Question:   Supervising Provider    Answer:   Merlyn AlbertLUKING, WILLIAM S [[id number]]  . predniSONE (DELTASONE) 20 MG tablet    Sig: 3 po qd x 2 d then 2 po qd x 2 d then 1 po qd x 2 d    Dispense:  12 tablet    Refill:  0    Order Specific Question:   Supervising Provider    Answer:   Merlyn AlbertLUKING, WILLIAM S [[id number]]  . HYDROcodone-homatropine (HYCODAN) 5-1.5 MG/5ML syrup    Sig: Take 5 mLs by mouth every 4 (four) hours as needed.    Dispense:  90 mL    Refill:  0    Order Specific Question:   Supervising Provider    Answer:   Merlyn AlbertLUKING, WILLIAM S [[id number]]  . fluticasone (FLONASE) 50 MCG/ACT nasal spray    Sig: Place 2 sprays into both nostrils daily as needed for allergies or rhinitis.    Dispense:  16 g    Refill:  5    PLEASE GIVE A 90 DAY SUPPLY WITH ONE ADDITIONAL REFILL    Order Specific Question:   Supervising Provider    Answer:   Merlyn AlbertLUKING, WILLIAM S [[id number]]  . albuterol  (PROVENTIL HFA;VENTOLIN HFA) 108 (90 Base) MCG/ACT inhaler    Sig: Inhale 2 puffs into the lungs every 4 (four) hours as needed.    Dispense:  1 Inhaler    Refill:  0    Order Specific Question:   Supervising Provider    Answer:   Riccardo DubinLUKING, WILLIAM S [[id number]]   Given written prescriptions for Zithromax and prednisone to have on hand over the weekend in case symptoms worsen.  Albuterol inhaler as directed as needed tightness or wheezing.  Cough syrup for nighttime use.  Drowsiness precautions.  Do not take within 4 hours of taking her warning signs reviewed.  Call back in 72 hours if no improvement, sooner if worse.

## 2016-12-16 ENCOUNTER — Ambulatory Visit
Admission: RE | Admit: 2016-12-16 | Discharge: 2016-12-16 | Disposition: A | Discharge status: Home / Self Care | Payer: Medicare HMO | Source: Ambulatory Visit | Attending: Family Medicine | Admitting: Family Medicine

## 2016-12-16 ENCOUNTER — Other Ambulatory Visit: Payer: Self-pay | Admitting: Family Medicine

## 2016-12-16 ENCOUNTER — Ambulatory Visit: Payer: Medicare HMO

## 2016-12-16 DIAGNOSIS — R928 Other abnormal and inconclusive findings on diagnostic imaging of breast: Secondary | ICD-10-CM

## 2016-12-16 DIAGNOSIS — R922 Inconclusive mammogram: Secondary | ICD-10-CM | POA: Diagnosis not present

## 2016-12-16 DIAGNOSIS — N651 Disproportion of reconstructed breast: Secondary | ICD-10-CM | POA: Diagnosis not present

## 2016-12-29 DIAGNOSIS — H524 Presbyopia: Secondary | ICD-10-CM | POA: Diagnosis not present

## 2016-12-29 DIAGNOSIS — E119 Type 2 diabetes mellitus without complications: Secondary | ICD-10-CM | POA: Diagnosis not present

## 2016-12-29 DIAGNOSIS — Z794 Long term (current) use of insulin: Secondary | ICD-10-CM | POA: Diagnosis not present

## 2016-12-29 LAB — HM DIABETES EYE EXAM

## 2017-01-01 ENCOUNTER — Encounter: Payer: Self-pay | Admitting: *Deleted

## 2017-01-24 ENCOUNTER — Other Ambulatory Visit: Payer: Self-pay | Admitting: Family Medicine

## 2017-01-25 ENCOUNTER — Other Ambulatory Visit: Payer: Self-pay | Admitting: *Deleted

## 2017-01-25 MED ORDER — METOPROLOL TARTRATE 100 MG PO TABS: 100.0000 mg | ORAL_TABLET | Freq: Two times a day (BID) | ORAL | 0 refills | Status: DC

## 2017-01-29 ENCOUNTER — Encounter: Payer: Self-pay | Admitting: Nurse Practitioner

## 2017-01-29 ENCOUNTER — Ambulatory Visit (INDEPENDENT_AMBULATORY_CARE_PROVIDER_SITE_OTHER): Payer: Medicare HMO | Admitting: Nurse Practitioner

## 2017-01-29 VITALS — BP 112/72 | HR 75 | Temp 98.1°F | Ht 66.0 in | Wt 225.0 lb

## 2017-01-29 DIAGNOSIS — L732 Hidradenitis suppurativa: Secondary | ICD-10-CM

## 2017-01-29 MED ORDER — HYDROCODONE-ACETAMINOPHEN 5-325 MG PO TABS: 1.0000 | ORAL_TABLET | ORAL | 0 refills | Status: DC | PRN

## 2017-01-29 MED ORDER — DOXYCYCLINE HYCLATE 100 MG PO TABS: 100.0000 mg | ORAL_TABLET | Freq: Two times a day (BID) | ORAL | 2 refills | Status: DC

## 2017-01-29 NOTE — Patient Instructions (Signed)
Hidradenitis Suppurativa Hidradenitis suppurativa is a long-term (chronic) skin disease that starts with blocked sweat glands or hair follicles. Bacteria may grow in these blocked openings of your skin. Hidradenitis suppurativa is like a severe form of acne that develops in areas of your body where acne would be unusual. It is most likely to affect the areas of your body where skin rubs against skin and becomes moist. This includes your:  Underarms.  Groin.  Genital areas.  Buttocks.  Upper thighs.  Breasts.  Hidradenitis suppurativa may start out with small pimples. The pimples can develop into deep sores that break open (rupture) and drain pus. Over time your skin may thicken and become scarred. Hidradenitis suppurativa cannot be passed from person to person. What are the causes? The exact cause of hidradenitis suppurativa is not known. This condition may be due to:  Female and female hormones. The condition is rare before and after puberty.  An overactive body defense system (immune system). Your immune system may overreact to the blocked hair follicles or sweat glands and cause swelling and pus-filled sores.  What increases the risk? You may have a higher risk of hidradenitis suppurativa if you:  Are a woman.  Are between ages 11 and 55.  Have a family history of hidradenitis suppurativa.  Have a personal history of acne.  Are overweight.  Smoke.  Take the drug lithium.  What are the signs or symptoms? The first signs of an outbreak are usually painful skin bumps that look like pimples. As the condition progresses:  Skin bumps may get bigger and grow deeper into the skin.  Bumps under the skin may rupture and drain smelly pus.  Skin may become itchy and infected.  Skin may thicken and scar.  Drainage may continue through tunnels under the skin (fistulas).  Walking and moving your arms can become painful.  How is this diagnosed? Your health care provider may  diagnose hidradenitis suppurativa based on your medical history and your signs and symptoms. A physical exam will also be done. You may need to see a health care provider who specializes in skin diseases (dermatologist). You may also have tests done to confirm the diagnosis. These can include:  Swabbing a sample of pus or drainage from your skin so it can be sent to the lab and tested for infection.  Blood tests to check for infection.  How is this treated? The same treatment will not work for everybody with hidradenitis suppurativa. Your treatment will depend on how severe your symptoms are. You may need to try several treatments to find what works best for you. Part of your treatment may include cleaning and bandaging (dressing) your wounds. You may also have to take medicines, such as the following:  Antibiotics.  Acne medicines.  Medicines to block or suppress the immune system.  A diabetes medicine (metformin) is sometimes used to treat this condition.  For women, birth control pills can sometimes help relieve symptoms.  You may need surgery if you have a severe case of hidradenitis suppurativa that does not respond to medicine. Surgery may involve:  Using a laser to clear the skin and remove hair follicles.  Opening and draining deep sores.  Removing the areas of skin that are diseased and scarred.  Follow these instructions at home:  Learn as much as you can about your disease, and work closely with your health care providers.  Take medicines only as directed by your health care provider.  If you were prescribed   an antibiotic medicine, finish it all even if you start to feel better.  If you are overweight, losing weight may be very helpful. Try to reach and maintain a healthy weight.  Do not use any tobacco products, including cigarettes, chewing tobacco, or electronic cigarettes. If you need help quitting, ask your health care provider.  Do not shave the areas where you  get hidradenitis suppurativa.  Do not wear deodorant.  Wear loose-fitting clothes.  Try not to overheat and get sweaty.  Take a daily bleach bath as directed by your health care provider. ? Fill your bathtub halfway with water. ? Pour in  cup of unscented household bleach. ? Soak for 5-10 minutes.  Cover sore areas with a warm, clean washcloth (compress) for 5-10 minutes. Contact a health care provider if:  You have a flare-up of hidradenitis suppurativa.  You have chills or a fever.  You are having trouble controlling your symptoms at home. This information is not intended to replace advice given to you by your health care provider. Make sure you discuss any questions you have with your health care provider. Document Released: 09/10/2003 Document Revised: 07/04/2015 Document Reviewed: 04/28/2013 Elsevier Interactive Patient Education  2018 [employer]  

## 2017-01-31 ENCOUNTER — Encounter: Payer: Self-pay | Admitting: Nurse Practitioner

## 2017-01-31 NOTE — Progress Notes (Signed)
Subjective:  Presents for c/o knots in the left armpit and right groin area. Has has recurrent cysts in both areas. Usually drains clear fluids. Will leave scars. No fever. Has been diagnosed with hidradenitis in the past.   Objective:   BP 112/72 (BP Location: Left Arm, Patient Position: Sitting)   Pulse 75   Temp 98.1 F (36.7 C) (Oral)   Ht 5\' 6"  (1.676 m)   Wt 225 lb (102.1 kg)   SpO2 97%   BMI 36.32 kg/m  NAD. Alert, oriented. Lungs clear. Heart RRR. Linear fluid filled cyst and one pustule noted left axillary area. Multiple scarring noted both axillae and groin area. Similar linear cyst noted above mons pubis.   Assessment:   Problem List Items Addressed This Visit      Musculoskeletal and Integument   Hidradenitis suppurativa - Primary       Plan:   Meds ordered this encounter  Medications  . doxycycline (VIBRA-TABS) 100 MG tablet    Sig: Take 1 tablet (100 mg total) by mouth 2 (two) times daily.    Dispense:  60 tablet    Refill:  2    Order Specific Question:   Supervising Provider    Answer:   Merlyn AlbertLUKING, WILLIAM S [[id number]]  . HYDROcodone-acetaminophen (NORCO/VICODIN) 5-325 MG tablet    Sig: Take 1 tablet by mouth every 4 (four) hours as needed.    Dispense:  40 tablet    Refill:  0    Order Specific Question:   Supervising Provider    Answer:   Merlyn AlbertLUKING, WILLIAM S [[id number]]   Given information on condition. Explained that it is a chronic condition. Start daily Doxycyline as preventive measure. May decrease to one daily for maintenance. Use antibacterial soap for washing these areas.  Warning signs reviewed. Call back next week if no improvement, sooner if worse.

## 2017-02-16 ENCOUNTER — Encounter: Payer: Self-pay | Admitting: Nurse Practitioner

## 2017-02-16 ENCOUNTER — Ambulatory Visit (INDEPENDENT_AMBULATORY_CARE_PROVIDER_SITE_OTHER): Payer: Medicare HMO | Admitting: Nurse Practitioner

## 2017-02-16 VITALS — BP 132/86 | Temp 99.5°F | Ht 66.0 in | Wt 227.0 lb

## 2017-02-16 DIAGNOSIS — J111 Influenza due to unidentified influenza virus with other respiratory manifestations: Secondary | ICD-10-CM | POA: Diagnosis not present

## 2017-02-16 MED ORDER — OSELTAMIVIR PHOSPHATE 75 MG PO CAPS: 75.0000 mg | ORAL_CAPSULE | Freq: Two times a day (BID) | ORAL | 0 refills | Status: DC

## 2017-02-16 NOTE — Progress Notes (Signed)
Subjective: Presents for complaints of cough runny nose headache sore throat muscle aches and fatigue that began less than 48 hours ago.  Slight fever.  Right ear pain.  Frequent cough.  Minimal wheezing.  Vomiting multiple times yesterday, none today.  No diarrhea.  Taking fluids well.  No urinary symptoms.  Has voided this morning without difficulty.  Has had her flu vaccine.  Objective:   BP 132/86   Temp 99.5 F (37.5 C) (Oral)   Ht 5\' 6"  (1.676 m)   Wt 227 lb (103 kg)   BMI 36.64 kg/m  NAD.  Alert, oriented.  Fatigued in appearance.  TMs clear effusion, no erythema.  Pharynx no erythema, mucous membranes moist.  Neck supple with mild soft anterior adenopathy.  Lungs clear.  Heart regular rate and rhythm.  Abdomen soft nondistended with mild generalized tenderness.  No specific area of discomfort.  Assessment:  Influenza    Plan:   Meds ordered this encounter  Medications  . oseltamivir (TAMIFLU) 75 MG capsule    Sig: Take 1 capsule (75 mg total) by mouth 2 (two) times daily.    Dispense:  10 capsule    Refill:  0    Order Specific Question:   Supervising Provider    Answer:   Merlyn AlbertLUKING, WILLIAM S [[id number]]   Reviewed symptomatic care and warning signs including dehydration.  Call back in 72 hours if no improvement, sooner if worse.  Increase clear fluid intake.   Return if symptoms worsen or fail to improve.

## 2017-02-16 NOTE — Patient Instructions (Signed)

## 2017-04-02 ENCOUNTER — Ambulatory Visit (INDEPENDENT_AMBULATORY_CARE_PROVIDER_SITE_OTHER): Payer: Medicare HMO | Admitting: Nurse Practitioner

## 2017-04-02 ENCOUNTER — Encounter: Payer: Self-pay | Admitting: Nurse Practitioner

## 2017-04-02 ENCOUNTER — Ambulatory Visit (HOSPITAL_COMMUNITY)
Admission: RE | Admit: 2017-04-02 | Discharge: 2017-04-02 | Disposition: A | Discharge status: Home / Self Care | Payer: Medicare HMO | Source: Ambulatory Visit | Attending: Nurse Practitioner | Admitting: Nurse Practitioner

## 2017-04-02 VITALS — BP 130/80 | Ht 66.0 in | Wt 225.8 lb

## 2017-04-02 DIAGNOSIS — R0602 Shortness of breath: Secondary | ICD-10-CM | POA: Insufficient documentation

## 2017-04-02 DIAGNOSIS — Z79899 Other long term (current) drug therapy: Secondary | ICD-10-CM | POA: Diagnosis not present

## 2017-04-02 DIAGNOSIS — E119 Type 2 diabetes mellitus without complications: Secondary | ICD-10-CM

## 2017-04-02 DIAGNOSIS — R52 Pain, unspecified: Secondary | ICD-10-CM

## 2017-04-02 DIAGNOSIS — R5383 Other fatigue: Secondary | ICD-10-CM | POA: Diagnosis not present

## 2017-04-02 DIAGNOSIS — K219 Gastro-esophageal reflux disease without esophagitis: Secondary | ICD-10-CM

## 2017-04-02 DIAGNOSIS — E785 Hyperlipidemia, unspecified: Secondary | ICD-10-CM | POA: Diagnosis not present

## 2017-04-02 DIAGNOSIS — R05 Cough: Secondary | ICD-10-CM | POA: Insufficient documentation

## 2017-04-02 DIAGNOSIS — R059 Cough, unspecified: Secondary | ICD-10-CM

## 2017-04-02 DIAGNOSIS — J310 Chronic rhinitis: Secondary | ICD-10-CM | POA: Diagnosis not present

## 2017-04-02 DIAGNOSIS — G43019 Migraine without aura, intractable, without status migrainosus: Secondary | ICD-10-CM | POA: Diagnosis not present

## 2017-04-02 LAB — POCT GLYCOSYLATED HEMOGLOBIN (HGB A1C): Hemoglobin A1C: 6.5

## 2017-04-03 DIAGNOSIS — R52 Pain, unspecified: Secondary | ICD-10-CM | POA: Diagnosis not present

## 2017-04-03 DIAGNOSIS — Z79899 Other long term (current) drug therapy: Secondary | ICD-10-CM | POA: Diagnosis not present

## 2017-04-03 DIAGNOSIS — R05 Cough: Secondary | ICD-10-CM | POA: Diagnosis not present

## 2017-04-03 DIAGNOSIS — E119 Type 2 diabetes mellitus without complications: Secondary | ICD-10-CM | POA: Diagnosis not present

## 2017-04-03 DIAGNOSIS — E785 Hyperlipidemia, unspecified: Secondary | ICD-10-CM | POA: Diagnosis not present

## 2017-04-03 DIAGNOSIS — R0602 Shortness of breath: Secondary | ICD-10-CM | POA: Diagnosis not present

## 2017-04-04 ENCOUNTER — Encounter: Payer: Self-pay | Admitting: Nurse Practitioner

## 2017-04-04 NOTE — Progress Notes (Signed)
Subjective:  Presents for multiple complaints. Has had a flare up of her migraines. Having headaches about every day. Throbbing constant headache in the frontal area. Sensitivity to light and sound. Mild blurred vision. Nausea, no vomiting. No aura. No numbness or weakness of the face, arms or legs. No difficulty speaking or swallowing. No relief with Hydrocodone, Aleve or Tylenol. Also head congestion for about 2 months. Non productive cough. Runny nose, sneezing. No fever. Watery eyes. Slight wheezing. Having flare up of her GERD. Out of her meds x 2 days. Sleeping on 2 pillows now at night. Some SOB unassociated with activity. No edema. Stopped Doxycycline for about a week due to nausea. Generalized myalgias and fatigue.   Objective:   BP 130/80   Ht 5\' 6"  (1.676 m)   Wt 225 lb 12.8 oz (102.4 kg)   SpO2 97%   BMI 36.45 kg/m  NAD. Alert, oriented. TMs clear effusion. Pharynx non erythematous with cloudy PND noted. Nasal mucosa pale and mildly boggy. Neck supple with mild anterior adenopathy. Lungs clear. Heart RRR. EKG normal. Abdomen soft, non distended with distinct epigastric area tenderness. LE: no edema.   Assessment:   Problem List Items Addressed This Visit      Cardiovascular and Mediastinum   Migraine     Digestive   Gastroesophageal reflux disease without esophagitis     Endocrine   Diabetes (HCC) - Primary   Relevant Orders   POCT glycosylated hemoglobin (Hb A1C) (Completed)   Basic metabolic panel (Completed)   Microalbumin / creatinine urine ratio (Completed)    Other Visit Diagnoses    Shortness of breath       Relevant Orders   PR ELECTROCARDIOGRAM, COMPLETE   DG Chest 2 View (Completed)   CBC with Differential/Platelet (Completed)   Brain natriuretic peptide (Completed)   Cough       Relevant Orders   PR ELECTROCARDIOGRAM, COMPLETE   DG Chest 2 View (Completed)   CBC with Differential/Platelet (Completed)   Body aches       Relevant Orders   TSH (Completed)    Vitamin B12 (Completed)   Ferritin (Completed)   High risk medication use       Relevant Orders   Hepatic function panel (Completed)   Hyperlipidemia, unspecified hyperlipidemia type       Relevant Orders   Lipid panel (Completed)   Mixed rhinitis          Plan:    Restart Flonase and Loratadine.  Restart Zantac as directed for reflux.  Chest xray and labs pending. Warning signs reviewed. Recheck in 2 weeks, sooner depending on results or if worse.  40 minutes was spent with the patient.  This statement verifies that 40 minutes was indeed spent with the patient. Greater than half the time was spent in discussion, counseling and answering questions  regarding the issues that the patient came in for today as reflected in the diagnosis (s) please refer to documentation for further details. Return in 2 weeks (on 04/16/2017).

## 2017-04-05 ENCOUNTER — Telehealth: Payer: Self-pay

## 2017-04-05 DIAGNOSIS — R69 Illness, unspecified: Secondary | ICD-10-CM | POA: Diagnosis not present

## 2017-04-05 LAB — CBC WITH DIFFERENTIAL/PLATELET
BASOS: 0 %
Basophils Absolute: 0 10*3/uL (ref 0.0–0.2)
EOS (ABSOLUTE): 0.2 10*3/uL (ref 0.0–0.4)
EOS: 4 %
HEMATOCRIT: 40.5 % (ref 34.0–46.6)
HEMOGLOBIN: 13 g/dL (ref 11.1–15.9)
Immature Grans (Abs): 0 10*3/uL (ref 0.0–0.1)
Immature Granulocytes: 0 %
LYMPHS ABS: 2.6 10*3/uL (ref 0.7–3.1)
Lymphs: 47 %
MCH: 28.9 pg (ref 26.6–33.0)
MCHC: 32.1 g/dL (ref 31.5–35.7)
MCV: 90 fL (ref 79–97)
MONOCYTES: 9 %
MONOS ABS: 0.5 10*3/uL (ref 0.1–0.9)
NEUTROS ABS: 2.2 10*3/uL (ref 1.4–7.0)
Neutrophils: 40 %
Platelets: 342 10*3/uL (ref 150–379)
RBC: 4.5 x10E6/uL (ref 3.77–5.28)
RDW: 14.2 % (ref 12.3–15.4)
WBC: 5.6 10*3/uL (ref 3.4–10.8)

## 2017-04-05 LAB — BASIC METABOLIC PANEL
BUN/Creatinine Ratio: 19 (ref 9–23)
BUN: 14 mg/dL (ref 6–24)
CALCIUM: 10.1 mg/dL (ref 8.7–10.2)
CO2: 24 mmol/L (ref 20–29)
CREATININE: 0.73 mg/dL (ref 0.57–1.00)
Chloride: 99 mmol/L (ref 96–106)
GFR calc Af Amer: 109 mL/min/{1.73_m2} (ref >59)
GFR, EST NON AFRICAN AMERICAN: 94 mL/min/{1.73_m2} (ref >59)
Glucose: 119 mg/dL — ABNORMAL HIGH (ref 65–99)
POTASSIUM: 4 mmol/L (ref 3.5–5.2)
Sodium: 141 mmol/L (ref 134–144)

## 2017-04-05 LAB — HEPATIC FUNCTION PANEL
ALBUMIN: 4.3 g/dL (ref 3.5–5.5)
ALT: 19 IU/L (ref 0–32)
AST: 16 IU/L (ref 0–40)
Alkaline Phosphatase: 72 IU/L (ref 39–117)
Bilirubin Total: 0.3 mg/dL (ref 0.0–1.2)
Bilirubin, Direct: 0.09 mg/dL (ref 0.00–0.40)
TOTAL PROTEIN: 7.6 g/dL (ref 6.0–8.5)

## 2017-04-05 LAB — BRAIN NATRIURETIC PEPTIDE: BNP: 5.1 pg/mL (ref 0.0–100.0)

## 2017-04-05 LAB — LIPID PANEL
CHOL/HDL RATIO: 4.4 ratio (ref 0.0–4.4)
Cholesterol, Total: 148 mg/dL (ref 100–199)
HDL: 34 mg/dL — ABNORMAL LOW (ref >39)
LDL CALC: 90 mg/dL (ref 0–99)
Triglycerides: 120 mg/dL (ref 0–149)
VLDL Cholesterol Cal: 24 mg/dL (ref 5–40)

## 2017-04-05 LAB — VITAMIN B12: VITAMIN B 12: 859 pg/mL (ref 232–1245)

## 2017-04-05 LAB — TSH: TSH: 0.638 u[IU]/mL (ref 0.450–4.500)

## 2017-04-05 LAB — MICROALBUMIN / CREATININE URINE RATIO
CREATININE, UR: 111.8 mg/dL
MICROALB/CREAT RATIO: 3.1 mg/g{creat} (ref 0.0–30.0)
MICROALBUM., U, RANDOM: 3.5 ug/mL

## 2017-04-05 LAB — FERRITIN: FERRITIN: 65 ng/mL (ref 15–150)

## 2017-04-05 NOTE — Telephone Encounter (Signed)
Patient states she was seen last week by Eber Jonesarolyn and told that she would call in a medication for reflux and migraines to her drug store. Please advise.

## 2017-04-05 NOTE — Progress Notes (Signed)
Left msg on patients answering machine

## 2017-04-06 ENCOUNTER — Other Ambulatory Visit: Payer: Self-pay | Admitting: Nurse Practitioner

## 2017-04-06 MED ORDER — RANITIDINE HCL 300 MG PO TABS
300.0000 mg | ORAL_TABLET | Freq: Every day | ORAL | 5 refills | Status: DC
Start: 2017-04-06 — End: 2017-07-20

## 2017-04-06 MED ORDER — TOPIRAMATE 50 MG PO TABS: ORAL_TABLET | ORAL | 0 refills | Status: DC

## 2017-04-06 NOTE — Telephone Encounter (Signed)
Sent in both meds. Follow up as planned. Call back sooner if worse.

## 2017-04-06 NOTE — Telephone Encounter (Signed)
Spoke with patient verbalized understanding.

## 2017-04-07 ENCOUNTER — Encounter: Payer: Self-pay | Admitting: Family Medicine

## 2017-04-16 ENCOUNTER — Ambulatory Visit (INDEPENDENT_AMBULATORY_CARE_PROVIDER_SITE_OTHER): Payer: Medicare HMO | Admitting: Nurse Practitioner

## 2017-04-16 ENCOUNTER — Encounter: Payer: Self-pay | Admitting: Nurse Practitioner

## 2017-04-16 VITALS — BP 110/72 | Temp 98.6°F | Ht 66.0 in | Wt 222.0 lb

## 2017-04-16 DIAGNOSIS — G43019 Migraine without aura, intractable, without status migrainosus: Secondary | ICD-10-CM | POA: Diagnosis not present

## 2017-04-16 DIAGNOSIS — E119 Type 2 diabetes mellitus without complications: Secondary | ICD-10-CM

## 2017-04-16 DIAGNOSIS — L732 Hidradenitis suppurativa: Secondary | ICD-10-CM | POA: Diagnosis not present

## 2017-04-16 DIAGNOSIS — G8929 Other chronic pain: Secondary | ICD-10-CM

## 2017-04-16 DIAGNOSIS — M545 Low back pain: Secondary | ICD-10-CM

## 2017-04-16 MED ORDER — LIRAGLUTIDE 18 MG/3ML ~~LOC~~ SOPN: 1.8000 mg | PEN_INJECTOR | Freq: Every day | SUBCUTANEOUS | 1 refills | Status: DC

## 2017-04-17 ENCOUNTER — Encounter: Payer: Self-pay | Admitting: Nurse Practitioner

## 2017-04-17 MED ORDER — MINOCYCLINE HCL 100 MG PO CAPS: 100.0000 mg | ORAL_CAPSULE | Freq: Two times a day (BID) | ORAL | 2 refills | Status: DC

## 2017-04-17 NOTE — Progress Notes (Signed)
Subjective:  Presents for recheck. See previous note. Her previous cough, SOB and body aches have significantly improved. GERD now stable on Zantac. Taking Topamax BID; has seen some improvement in the number of migraines. Has hidradenitis suppurativa. Could not take Doxycyline due to stomach upset. Has a beginning infection in the left axillary area. Doing well on Victoza. Denies any adverse effects. Currently on 1.2 mg dose. Also requesting [place] to pain [place] in [place]. Has chronic low back pain as well as other arthralgias.   Objective:   BP 110/72   Temp 98.6 F (37 C) (Oral)   Ht 5\' 6"  (1.676 m)   Wt 222 lb (100.7 kg)   BMI 35.83 kg/m  NAD. Alert, oriented. Cheerful affect. Much improved from previous visit. Lungs clear. Heart RRR. Small area of erythema with a tiny pustule noted in mid left axillary area.  Results for orders placed or performed in visit on 04/02/17  CBC with Differential/Platelet  Result Value Ref Range   WBC 5.6 3.4 - 10.8 x10E3/uL   RBC 4.50 3.77 - 5.28 x10E6/uL   Hemoglobin 13.0 11.1 - 15.9 g/dL   Hematocrit 16.140.5 09.634.0 - 46.6 %   MCV 90 79 - 97 fL   MCH 28.9 26.6 - 33.0 pg   MCHC 32.1 31.5 - 35.7 g/dL   RDW 04.514.2 40.912.3 - 81.115.4 %   Platelets 342 150 - 379 x10E3/uL   Neutrophils 40 Not Estab. %   Lymphs 47 Not Estab. %   Monocytes 9 Not Estab. %   Eos 4 Not Estab. %   Basos 0 Not Estab. %   Neutrophils Absolute 2.2 1.4 - 7.0 x10E3/uL   Lymphocytes Absolute 2.6 0.7 - 3.1 x10E3/uL   Monocytes Absolute 0.5 0.1 - 0.9 x10E3/uL   EOS (ABSOLUTE) 0.2 0.0 - 0.4 x10E3/uL   Basophils Absolute 0.0 0.0 - 0.2 x10E3/uL   Immature Granulocytes 0 Not Estab. %   Immature Grans (Abs) 0.0 0.0 - 0.1 x10E3/uL  Brain natriuretic peptide  Result Value Ref Range   BNP 5.1 0.0 - 100.0 pg/mL  Lipid panel  Result Value Ref Range   Cholesterol, Total 148 100 - 199 mg/dL   Triglycerides 914120 0 - 149 mg/dL   HDL 34 (L) >78>39 mg/dL   VLDL Cholesterol Cal 24 5 - 40 mg/dL   LDL Calculated 90 0 - 99 mg/dL   Chol/HDL Ratio 4.4 0.0 - 4.4 ratio  Hepatic function panel  Result Value Ref Range   Total Protein 7.6 6.0 - 8.5 g/dL   Albumin 4.3 3.5 - 5.5 g/dL   Bilirubin Total 0.3 0.0 - 1.2 mg/dL   Bilirubin, Direct 2.950.09 0.00 - 0.40 mg/dL   Alkaline Phosphatase 72 39 - 117 IU/L   AST 16 0 - 40 IU/L   ALT 19 0 - 32 IU/L  Basic metabolic panel  Result Value Ref Range   Glucose 119 (H) 65 - 99 mg/dL   BUN 14 6 - 24 mg/dL   Creatinine, Ser 6.210.73 0.57 - 1.00 mg/dL   GFR calc non Af Amer 94 >59 mL/min/1.73   GFR calc Af Amer 109 >59 mL/min/1.73   BUN/Creatinine Ratio 19 9 - 23   Sodium 141 134 - 144 mmol/L   Potassium 4.0 3.5 - 5.2 mmol/L   Chloride 99 96 - 106 mmol/L   CO2 24 20 - 29 mmol/L   Calcium 10.1 8.7 - 10.2 mg/dL  TSH  Result Value Ref Range   TSH 0.638 0.450 -  4.500 uIU/mL  Microalbumin / creatinine urine ratio  Result Value Ref Range   Creatinine, Urine 111.8 Not Estab. mg/dL   Microalbumin, Urine 3.5 Not Estab. ug/mL   Microalb/Creat Ratio 3.1 0.0 - 30.0 mg/g creat  Vitamin B12  Result Value Ref Range   Vitamin B-12 859 232 - 1,245 pg/mL  Ferritin  Result Value Ref Range   Ferritin 65 15 - 150 ng/mL  POCT glycosylated hemoglobin (Hb A1C)  Result Value Ref Range   Hemoglobin A1C 6.5    Reviewed labs with patient.   Assessment:   Problem List Items Addressed This Visit      Cardiovascular and Mediastinum   Migraine     Endocrine   Diabetes (HCC)   Relevant Medications   liraglutide (VICTOZA) 18 MG/3ML SOPN     Musculoskeletal and Integument   Hidradenitis suppurativa - Primary     Other   Chronic bilateral low back pain   Relevant Orders   Ambulatory referral to Pain Clinic         Plan:     Meds ordered this encounter  Medications  . liraglutide (VICTOZA) 18 MG/3ML SOPN    Sig: Inject 0.3 mLs (1.8 mg total) into the skin daily.    Dispense:  9 pen    Refill:  1    Give 90 day supply with refills    Order Specific  Question:   Supervising Provider    Answer:   Merlyn Albert [[id number]]  . minocycline (MINOCIN,DYNACIN) 100 MG capsule    Sig: Take 1 capsule (100 mg total) by mouth 2 (two) times daily.    Dispense:  60 capsule    Refill:  2    Order Specific Question:   Supervising Provider    Answer:   Merlyn Albert [[id number]]   Increase Victoza dose. Contact office if any adverse effects.  Encouraged activity as tolerated and healthy diet.  Trial of Minocycline for hidradentitis.  Refer to pain specialist.  Return in about 3 months (around 07/17/2017) for recheck.  25 minutes was spent with the patient.  This statement verifies that 25 minutes was indeed spent with the patient. Greater than half the time was spent in discussion, counseling and answering questions  regarding the issues that the patient came in for today as reflected in the diagnosis (s) please refer to documentation for further details.  Call back sooner if needed.

## 2017-04-22 ENCOUNTER — Encounter: Payer: Self-pay | Admitting: Family Medicine

## 2017-04-26 ENCOUNTER — Encounter: Payer: Self-pay | Admitting: Family Medicine

## 2017-05-06 DIAGNOSIS — G8929 Other chronic pain: Secondary | ICD-10-CM | POA: Diagnosis not present

## 2017-05-06 DIAGNOSIS — M545 Low back pain: Secondary | ICD-10-CM | POA: Diagnosis not present

## 2017-05-06 DIAGNOSIS — Z79899 Other long term (current) drug therapy: Secondary | ICD-10-CM | POA: Diagnosis not present

## 2017-05-12 DIAGNOSIS — Z79899 Other long term (current) drug therapy: Secondary | ICD-10-CM | POA: Diagnosis not present

## 2017-05-12 DIAGNOSIS — M47816 Spondylosis without myelopathy or radiculopathy, lumbar region: Secondary | ICD-10-CM | POA: Diagnosis not present

## 2017-05-18 DIAGNOSIS — M47816 Spondylosis without myelopathy or radiculopathy, lumbar region: Secondary | ICD-10-CM | POA: Diagnosis not present

## 2017-05-18 DIAGNOSIS — Z79899 Other long term (current) drug therapy: Secondary | ICD-10-CM | POA: Diagnosis not present

## 2017-05-25 DIAGNOSIS — M6281 Muscle weakness (generalized): Secondary | ICD-10-CM | POA: Diagnosis not present

## 2017-05-25 DIAGNOSIS — M5416 Radiculopathy, lumbar region: Secondary | ICD-10-CM | POA: Diagnosis not present

## 2017-05-25 DIAGNOSIS — M545 Low back pain: Secondary | ICD-10-CM | POA: Diagnosis not present

## 2017-05-25 DIAGNOSIS — M5442 Lumbago with sciatica, left side: Secondary | ICD-10-CM | POA: Diagnosis not present

## 2017-05-26 ENCOUNTER — Telehealth: Payer: Self-pay | Admitting: Nurse Practitioner

## 2017-05-26 DIAGNOSIS — R69 Illness, unspecified: Secondary | ICD-10-CM | POA: Diagnosis not present

## 2017-05-26 NOTE — Telephone Encounter (Signed)
Patient is requesting a refill on her One Touch Ultra test strips 90 day supply.  [facility]

## 2017-05-26 NOTE — Telephone Encounter (Signed)
Script printed. Await dr signature 

## 2017-05-26 NOTE — Telephone Encounter (Signed)
rx faxed  Pt notified

## 2017-06-01 DIAGNOSIS — M5442 Lumbago with sciatica, left side: Secondary | ICD-10-CM | POA: Diagnosis not present

## 2017-06-01 DIAGNOSIS — M6281 Muscle weakness (generalized): Secondary | ICD-10-CM | POA: Diagnosis not present

## 2017-06-01 DIAGNOSIS — M545 Low back pain: Secondary | ICD-10-CM | POA: Diagnosis not present

## 2017-06-01 DIAGNOSIS — M5416 Radiculopathy, lumbar region: Secondary | ICD-10-CM | POA: Diagnosis not present

## 2017-06-02 DIAGNOSIS — G4700 Insomnia, unspecified: Secondary | ICD-10-CM | POA: Diagnosis not present

## 2017-06-02 DIAGNOSIS — E1151 Type 2 diabetes mellitus with diabetic peripheral angiopathy without gangrene: Secondary | ICD-10-CM | POA: Diagnosis not present

## 2017-06-02 DIAGNOSIS — I10 Essential (primary) hypertension: Secondary | ICD-10-CM | POA: Diagnosis not present

## 2017-06-02 DIAGNOSIS — E1142 Type 2 diabetes mellitus with diabetic polyneuropathy: Secondary | ICD-10-CM | POA: Diagnosis not present

## 2017-06-02 DIAGNOSIS — G8929 Other chronic pain: Secondary | ICD-10-CM | POA: Diagnosis not present

## 2017-06-02 DIAGNOSIS — E11620 Type 2 diabetes mellitus with diabetic dermatitis: Secondary | ICD-10-CM | POA: Diagnosis not present

## 2017-06-02 DIAGNOSIS — R69 Illness, unspecified: Secondary | ICD-10-CM | POA: Diagnosis not present

## 2017-06-02 DIAGNOSIS — G43109 Migraine with aura, not intractable, without status migrainosus: Secondary | ICD-10-CM | POA: Diagnosis not present

## 2017-06-17 DIAGNOSIS — G8929 Other chronic pain: Secondary | ICD-10-CM | POA: Diagnosis not present

## 2017-06-17 DIAGNOSIS — Z79899 Other long term (current) drug therapy: Secondary | ICD-10-CM | POA: Diagnosis not present

## 2017-06-17 DIAGNOSIS — M545 Low back pain: Secondary | ICD-10-CM | POA: Diagnosis not present

## 2017-06-17 DIAGNOSIS — M47816 Spondylosis without myelopathy or radiculopathy, lumbar region: Secondary | ICD-10-CM | POA: Diagnosis not present

## 2017-06-18 ENCOUNTER — Other Ambulatory Visit: Payer: Self-pay | Admitting: Nurse Practitioner

## 2017-06-18 DIAGNOSIS — M545 Low back pain: Principal | ICD-10-CM

## 2017-06-18 DIAGNOSIS — G8929 Other chronic pain: Secondary | ICD-10-CM

## 2017-06-28 ENCOUNTER — Ambulatory Visit
Admission: RE | Admit: 2017-06-28 | Discharge: 2017-06-28 | Disposition: A | Discharge status: Home / Self Care | Payer: Medicare HMO | Source: Ambulatory Visit | Attending: Nurse Practitioner | Admitting: Nurse Practitioner

## 2017-06-28 DIAGNOSIS — M5127 Other intervertebral disc displacement, lumbosacral region: Secondary | ICD-10-CM | POA: Diagnosis not present

## 2017-06-28 DIAGNOSIS — G8929 Other chronic pain: Secondary | ICD-10-CM

## 2017-06-28 DIAGNOSIS — M545 Low back pain: Principal | ICD-10-CM

## 2017-06-28 MED ORDER — GADOBENATE DIMEGLUMINE 529 MG/ML IV SOLN
20.0000 mL | Freq: Once | INTRAVENOUS | Status: AC | PRN
  Administered 2017-06-28: 20 mL via INTRAVENOUS

## 2017-07-12 DIAGNOSIS — G8929 Other chronic pain: Secondary | ICD-10-CM | POA: Diagnosis not present

## 2017-07-12 DIAGNOSIS — M545 Low back pain: Secondary | ICD-10-CM | POA: Diagnosis not present

## 2017-07-12 DIAGNOSIS — M47816 Spondylosis without myelopathy or radiculopathy, lumbar region: Secondary | ICD-10-CM | POA: Diagnosis not present

## 2017-07-12 DIAGNOSIS — Z79899 Other long term (current) drug therapy: Secondary | ICD-10-CM | POA: Diagnosis not present

## 2017-07-16 ENCOUNTER — Ambulatory Visit: Payer: Medicare HMO | Admitting: Nurse Practitioner

## 2017-07-19 ENCOUNTER — Telehealth: Payer: Self-pay | Admitting: Nurse Practitioner

## 2017-07-19 NOTE — Telephone Encounter (Addendum)
Patient dropped off radiology report for you to review from [facility].  See on desk.  She has an appointment tomorrow.

## 2017-07-20 ENCOUNTER — Ambulatory Visit (INDEPENDENT_AMBULATORY_CARE_PROVIDER_SITE_OTHER): Payer: Medicare HMO | Admitting: Nurse Practitioner

## 2017-07-20 ENCOUNTER — Encounter: Payer: Self-pay | Admitting: Nurse Practitioner

## 2017-07-20 VITALS — BP 138/88 | HR 86 | Ht 66.0 in | Wt 220.8 lb

## 2017-07-20 DIAGNOSIS — E119 Type 2 diabetes mellitus without complications: Secondary | ICD-10-CM | POA: Diagnosis not present

## 2017-07-20 DIAGNOSIS — L732 Hidradenitis suppurativa: Secondary | ICD-10-CM

## 2017-07-20 DIAGNOSIS — G43019 Migraine without aura, intractable, without status migrainosus: Secondary | ICD-10-CM | POA: Diagnosis not present

## 2017-07-20 DIAGNOSIS — B001 Herpesviral vesicular dermatitis: Secondary | ICD-10-CM | POA: Diagnosis not present

## 2017-07-20 LAB — POCT GLYCOSYLATED HEMOGLOBIN (HGB A1C): HEMOGLOBIN A1C: 5.5 % (ref 4.0–5.6)

## 2017-07-20 MED ORDER — RANITIDINE HCL 300 MG PO TABS: 300.0000 mg | ORAL_TABLET | Freq: Every day | ORAL | 1 refills | Status: DC

## 2017-07-20 MED ORDER — METOPROLOL TARTRATE 100 MG PO TABS: 100.0000 mg | ORAL_TABLET | Freq: Two times a day (BID) | ORAL | 1 refills | Status: DC

## 2017-07-20 MED ORDER — MINOCYCLINE HCL 100 MG PO CAPS: 100.0000 mg | ORAL_CAPSULE | Freq: Two times a day (BID) | ORAL | 1 refills | Status: DC

## 2017-07-20 MED ORDER — PROMETHAZINE HCL 25 MG PO TABS: 25.0000 mg | ORAL_TABLET | Freq: Three times a day (TID) | ORAL | 2 refills | Status: DC | PRN

## 2017-07-20 MED ORDER — VALACYCLOVIR HCL 1 G PO TABS: ORAL_TABLET | ORAL | 5 refills | Status: DC

## 2017-07-20 MED ORDER — TRIAMTERENE-HCTZ 37.5-25 MG PO TABS: 1.0000 | ORAL_TABLET | Freq: Every day | ORAL | 0 refills | Status: DC

## 2017-07-20 MED ORDER — TOPIRAMATE 50 MG PO TABS: ORAL_TABLET | ORAL | 1 refills | Status: DC

## 2017-07-20 MED ORDER — DULOXETINE HCL 60 MG PO CPEP: ORAL_CAPSULE | ORAL | 1 refills | Status: DC

## 2017-07-20 NOTE — Progress Notes (Signed)
Subjective: Presents for recheck on her diabetes.  Compliant with medications.  Blood sugars at home averaging 112-128 fasting.  Diet overall healthy.  Seeing pain management.  Requesting Valtrex refilled today due to a fever blister that started this morning.  Is currently on Topamax which is controlling her migraines.  Hidradenitis under good control with minocycline which she takes as needed.  No chest pain/ischemic type pain or shortness of breath  Objective:   BP 138/88 (BP Location: Left Arm, Patient Position: Sitting)   Pulse 86   Ht 5\' 6"  (1.676 m)   Wt 220 lb 12.8 oz (100.2 kg)   SpO2 98%   BMI 35.64 kg/m  NAD.  Alert, oriented.  Cheerful affect.  Thoughts logical coherent and relevant.  Dressed appropriately.  Making good eye contact.  Lungs clear.  Heart regular rate and rhythm.  Abdomen soft nondistended nontender.  A small herpetic lesion is noted only right upper lip. Diabetic Foot Exam - Simple   Simple Foot Form Diabetic Foot exam was performed with the following findings:  Yes 07/20/2017 11:00 AM  Visual Inspection No deformities, no ulcerations, no other skin breakdown bilaterally:  Yes Sensation Testing Intact to touch and monofilament testing bilaterally:  Yes Pulse Check See comments:  Yes Comments DP pulses present bilateral.  Toes warm with good capillary refill.    Results for orders placed or performed in visit on 07/20/17  POCT HgB A1C  Result Value Ref Range   Hemoglobin A1C 5.5 4.0 - 5.6 %   HbA1c, POC (prediabetic range)  5.7 - 6.4 %   HbA1c, POC (controlled diabetic range)  0.0 - 7.0 %     Assessment:   Problem List Items Addressed This Visit      Cardiovascular and Mediastinum   Migraine   Relevant Medications   triamterene-hydrochlorothiazide (MAXZIDE-25) 37.5-25 MG tablet   metoprolol tartrate (LOPRESSOR) 100 MG tablet   topiramate (TOPAMAX) 50 MG tablet   DULoxetine (CYMBALTA) 60 MG capsule     Digestive   Herpes labialis   Relevant  Medications   valACYclovir (VALTREX) 1000 MG tablet     Endocrine   Diabetes (HCC) - Primary   Relevant Orders   POCT HgB A1C (Completed)     Musculoskeletal and Integument   Hidradenitis suppurativa   Relevant Medications   valACYclovir (VALTREX) 1000 MG tablet        Plan:   Meds ordered this encounter  Medications  . triamterene-hydrochlorothiazide (MAXZIDE-25) 37.5-25 MG tablet    Sig: Take 1 tablet by mouth daily.    Dispense:  90 tablet    Refill:  0    Order Specific Question:   Supervising Provider    Answer:   Merlyn Albert [[id number]]  . metoprolol tartrate (LOPRESSOR) 100 MG tablet    Sig: Take 1 tablet (100 mg total) by mouth 2 (two) times daily.    Dispense:  180 tablet    Refill:  1    Order Specific Question:   Supervising Provider    Answer:   Merlyn Albert [[id number]]  . minocycline (MINOCIN,DYNACIN) 100 MG capsule    Sig: Take 1 capsule (100 mg total) by mouth 2 (two) times daily. For infection    Dispense:  180 capsule    Refill:  1    Order Specific Question:   Supervising Provider    Answer:   Merlyn Albert [[id number]]  . promethazine (PHENERGAN) 25 MG tablet    Sig: Take 1 tablet (25  mg total) by mouth every 8 (eight) hours as needed for nausea or vomiting.    Dispense:  30 tablet    Refill:  2    Order Specific Question:   Supervising Provider    Answer:   Merlyn AlbertLUKING, WILLIAM S [[id number]]  . ranitidine (ZANTAC) 300 MG tablet    Sig: Take 1 tablet (300 mg total) by mouth at bedtime. Prn acid reflux    Dispense:  90 tablet    Refill:  1    Order Specific Question:   Supervising Provider    Answer:   Merlyn AlbertLUKING, WILLIAM S [[id number]]  . topiramate (TOPAMAX) 50 MG tablet    Sig: Take one po at bedtime for migraines    Dispense:  90 tablet    Refill:  1    Order Specific Question:   Supervising Provider    Answer:   Merlyn AlbertLUKING, WILLIAM S [[id number]]  . DULoxetine (CYMBALTA) 60 MG capsule    Sig: Take one po qd    Dispense:  90 capsule    Refill:  1    Order Specific  Question:   Supervising Provider    Answer:   Merlyn AlbertLUKING, WILLIAM S [[id number]]  . valACYclovir (VALTREX) 1000 MG tablet    Sig: Take 2 po x 2 doses 12 hours apart prn fever blisters    Dispense:  4 tablet    Refill:  5    Order Specific Question:   Supervising Provider    Answer:   Merlyn AlbertLUKING, WILLIAM S [[id number]]   Continue current medications as directed.  Encouraged activity as tolerated continued healthy diet and weight loss efforts. Return in about 6 months (around 01/19/2018) for Recheck. Recommend wellness exam. 25 minutes was spent with the patient.  This statement verifies that 25 minutes was indeed spent with the patient. Greater than half the time was spent in discussion, counseling and answering questions  regarding the issues that the patient came in for today as reflected in the diagnosis (s) please refer to documentation for further details.

## 2017-07-20 NOTE — Telephone Encounter (Signed)
Reviewed with patient at visit 07/20/17

## 2017-08-10 DIAGNOSIS — M47816 Spondylosis without myelopathy or radiculopathy, lumbar region: Secondary | ICD-10-CM | POA: Diagnosis not present

## 2017-08-10 DIAGNOSIS — G8929 Other chronic pain: Secondary | ICD-10-CM | POA: Diagnosis not present

## 2017-08-10 DIAGNOSIS — M545 Low back pain: Secondary | ICD-10-CM | POA: Diagnosis not present

## 2017-08-10 DIAGNOSIS — Z79899 Other long term (current) drug therapy: Secondary | ICD-10-CM | POA: Diagnosis not present

## 2017-08-17 DIAGNOSIS — M5416 Radiculopathy, lumbar region: Secondary | ICD-10-CM | POA: Diagnosis not present

## 2017-08-17 DIAGNOSIS — M545 Low back pain: Secondary | ICD-10-CM | POA: Diagnosis not present

## 2017-08-18 ENCOUNTER — Encounter: Payer: Self-pay | Admitting: Nurse Practitioner

## 2017-08-18 ENCOUNTER — Ambulatory Visit (INDEPENDENT_AMBULATORY_CARE_PROVIDER_SITE_OTHER): Payer: Medicare HMO | Admitting: Nurse Practitioner

## 2017-08-18 VITALS — BP 128/82 | Ht 66.0 in | Wt 221.6 lb

## 2017-08-18 DIAGNOSIS — Z0000 Encounter for general adult medical examination without abnormal findings: Secondary | ICD-10-CM | POA: Diagnosis not present

## 2017-08-18 DIAGNOSIS — Z01419 Encounter for gynecological examination (general) (routine) without abnormal findings: Secondary | ICD-10-CM

## 2017-08-18 DIAGNOSIS — Z113 Encounter for screening for infections with a predominantly sexual mode of transmission: Secondary | ICD-10-CM

## 2017-08-18 DIAGNOSIS — R69 Illness, unspecified: Secondary | ICD-10-CM | POA: Diagnosis not present

## 2017-08-20 ENCOUNTER — Encounter: Payer: Self-pay | Admitting: Nurse Practitioner

## 2017-08-20 LAB — CHLAMYDIA/GONOCOCCUS/TRICHOMONAS, NAA
Chlamydia by NAA: NEGATIVE
GONOCOCCUS BY NAA: NEGATIVE
TRICH VAG BY NAA: NEGATIVE

## 2017-08-20 LAB — SPECIMEN STATUS REPORT

## 2017-08-20 NOTE — Progress Notes (Signed)
   Subjective:    Patient ID: [name], female    DOB: 1965/12/10, 52 y.o.   MRN: [id number]  HPI presents for her wellness exam.  Has had a hysterectomy but has both ovaries.  Same sexual partner.  Regular vision and dental exams.  Active lifestyle, mainly walking.    Review of Systems  Constitutional: Negative for activity change, appetite change and fatigue.  HENT: Negative for dental problem, ear pain, sinus pressure and sore throat.   Respiratory: Negative for cough, chest tightness, shortness of breath and wheezing.   Cardiovascular: Negative for chest pain.  Gastrointestinal: Negative for abdominal distention, abdominal pain, constipation, diarrhea, nausea and vomiting.  Genitourinary: Negative for difficulty urinating, dysuria, enuresis, frequency, genital sores, pelvic pain, urgency and vaginal discharge.   Depression screen [facility] 2/9 08/18/2017 07/21/2016 01/31/2014  Decreased Interest 0 2 2  Down, Depressed, Hopeless 2 2 2   PHQ - 2 Score 2 4 4   Altered sleeping 3 2 3   Tired, decreased energy 3 2 3   Change in appetite 3 2 2   Feeling bad or failure about yourself  0 2 0  Trouble concentrating 1 2 3   Moving slowly or fidgety/restless 1 1 0  Suicidal thoughts 0 0 0  PHQ-9 Score 13 15 15   Difficult doing work/chores Somewhat difficult - -        Objective:   Physical Exam  Constitutional: She is oriented to person, place, and time. She appears well-developed. No distress.  HENT:  Right Ear: External ear normal.  Left Ear: External ear normal.  Mouth/Throat: Oropharynx is clear and moist.  Neck: Normal range of motion. Neck supple. No tracheal deviation present. No thyromegaly present.  Cardiovascular: Normal rate, regular rhythm and normal heart sounds. Exam reveals no gallop.  No murmur heard. Pulmonary/Chest: Effort normal and breath sounds normal. Right breast exhibits no inverted nipple, no mass, no skin change and no tenderness. Left breast exhibits no  inverted nipple, no mass, no skin change and no tenderness. Breasts are symmetrical.  Axillae no adenopathy.  Abdominal: Soft. She exhibits no distension. There is no tenderness.  Genitourinary: Vagina normal. No vaginal discharge found.  Genitourinary Comments: External GU: No rashes or lesions.  Vagina no discharge.  Bimanual exam no tenderness or obvious masses.  Exam limited due to abdominal girth.  Musculoskeletal: She exhibits no edema.  Lymphadenopathy:    She has no cervical adenopathy.  Neurological: She is alert and oriented to person, place, and time.  Skin: Skin is warm and dry. No rash noted.  Healing lesions from hidradenitis.   Psychiatric: She has a normal mood and affect. Her behavior is normal.          Assessment & Plan:  Well woman exam  Screen for STD (sexually transmitted disease) - Plan: Chlamydia/Gonococcus/Trichomonas, NAA  Requests GC and CT screening. Defers changes in meds for anxiety and depression at this time.  Encouraged continued activity and weight loss efforts.  Return in about 4 months (around 12/19/2017), or diabetes, for physical in one year.

## 2017-09-02 DIAGNOSIS — M5416 Radiculopathy, lumbar region: Secondary | ICD-10-CM | POA: Diagnosis not present

## 2017-09-02 DIAGNOSIS — M545 Low back pain: Secondary | ICD-10-CM | POA: Diagnosis not present

## 2017-09-17 ENCOUNTER — Other Ambulatory Visit: Payer: Self-pay | Admitting: *Deleted

## 2017-09-17 MED ORDER — LIRAGLUTIDE 18 MG/3ML ~~LOC~~ SOPN: 1.8000 mg | PEN_INJECTOR | Freq: Every day | SUBCUTANEOUS | 0 refills | Status: DC

## 2017-09-29 DIAGNOSIS — M5416 Radiculopathy, lumbar region: Secondary | ICD-10-CM | POA: Diagnosis not present

## 2017-09-29 DIAGNOSIS — M545 Low back pain: Secondary | ICD-10-CM | POA: Diagnosis not present

## 2017-10-25 ENCOUNTER — Other Ambulatory Visit: Payer: Self-pay | Admitting: *Deleted

## 2017-10-25 MED ORDER — LORATADINE 10 MG PO TABS: 10.0000 mg | ORAL_TABLET | Freq: Every day | ORAL | 3 refills | Status: DC

## 2017-10-26 DIAGNOSIS — M47816 Spondylosis without myelopathy or radiculopathy, lumbar region: Secondary | ICD-10-CM | POA: Diagnosis not present

## 2017-10-26 DIAGNOSIS — M545 Low back pain: Secondary | ICD-10-CM | POA: Diagnosis not present

## 2017-10-26 DIAGNOSIS — M5416 Radiculopathy, lumbar region: Secondary | ICD-10-CM | POA: Diagnosis not present

## 2017-11-11 ENCOUNTER — Other Ambulatory Visit: Payer: Self-pay | Admitting: Family Medicine

## 2017-11-11 DIAGNOSIS — Z1231 Encounter for screening mammogram for malignant neoplasm of breast: Secondary | ICD-10-CM

## 2017-11-11 DIAGNOSIS — R928 Other abnormal and inconclusive findings on diagnostic imaging of breast: Secondary | ICD-10-CM

## 2017-12-17 ENCOUNTER — Other Ambulatory Visit: Payer: Self-pay

## 2017-12-17 ENCOUNTER — Ambulatory Visit
Admission: RE | Admit: 2017-12-17 | Discharge: 2017-12-17 | Disposition: A | Discharge status: Home / Self Care | Payer: Medicare HMO | Source: Ambulatory Visit | Attending: Family Medicine | Admitting: Family Medicine

## 2017-12-17 DIAGNOSIS — Z1231 Encounter for screening mammogram for malignant neoplasm of breast: Secondary | ICD-10-CM

## 2017-12-17 MED ORDER — TRIAMTERENE-HCTZ 37.5-25 MG PO TABS: 1.0000 | ORAL_TABLET | Freq: Every day | ORAL | 0 refills | Status: DC

## 2017-12-20 ENCOUNTER — Ambulatory Visit: Payer: Medicare HMO | Admitting: Family Medicine

## 2017-12-21 ENCOUNTER — Ambulatory Visit: Payer: Medicare HMO | Admitting: Family Medicine

## 2017-12-21 ENCOUNTER — Other Ambulatory Visit: Payer: Self-pay | Admitting: Family Medicine

## 2017-12-21 ENCOUNTER — Ambulatory Visit (INDEPENDENT_AMBULATORY_CARE_PROVIDER_SITE_OTHER): Payer: Medicare HMO | Admitting: Family Medicine

## 2017-12-21 VITALS — BP 130/88 | Ht 66.0 in | Wt 220.8 lb

## 2017-12-21 DIAGNOSIS — E785 Hyperlipidemia, unspecified: Secondary | ICD-10-CM | POA: Diagnosis not present

## 2017-12-21 DIAGNOSIS — I10 Essential (primary) hypertension: Secondary | ICD-10-CM | POA: Diagnosis not present

## 2017-12-21 DIAGNOSIS — E119 Type 2 diabetes mellitus without complications: Secondary | ICD-10-CM

## 2017-12-21 DIAGNOSIS — Z23 Encounter for immunization: Secondary | ICD-10-CM | POA: Diagnosis not present

## 2017-12-21 DIAGNOSIS — G629 Polyneuropathy, unspecified: Secondary | ICD-10-CM | POA: Diagnosis not present

## 2017-12-21 DIAGNOSIS — R05 Cough: Secondary | ICD-10-CM | POA: Diagnosis not present

## 2017-12-21 DIAGNOSIS — R053 Chronic cough: Secondary | ICD-10-CM

## 2017-12-21 DIAGNOSIS — R0602 Shortness of breath: Secondary | ICD-10-CM

## 2017-12-21 LAB — POCT GLYCOSYLATED HEMOGLOBIN (HGB A1C): Hemoglobin A1C: 6.8 % — AB (ref 4.0–5.6)

## 2017-12-21 MED ORDER — HYDROCODONE-ACETAMINOPHEN 7.5-325 MG PO TABS: 1.0000 | ORAL_TABLET | Freq: Four times a day (QID) | ORAL | 0 refills | Status: DC | PRN

## 2017-12-21 NOTE — Progress Notes (Signed)
Subjective:    Patient ID: [name], female    DOB: 11-10-1965, 52 y.o.   MRN: [id number]  Diabetes  She presents for her follow-up diabetic visit. She has type 2 diabetes mellitus. There are no hypoglycemic associated symptoms. Pertinent negatives for hypoglycemia include no confusion or dizziness. There are no diabetic associated symptoms. Pertinent negatives for diabetes include no chest pain, no fatigue, no polydipsia, no polyphagia and no weakness. There are no hypoglycemic complications. There are no diabetic complications. She does not see a podiatrist.Eye exam is current.   Sciatica for a year Has seen pain management Tried accupuncture Tried injections Nothing seems to be helping much  Now she relates a lot of tingling in her hands and her feet she feels they are going numb on her she is on Topamax for her migraines.  It is known that this is a potential side effect.  I did discuss this with her.  She is willing to try holding off on this over the course of the next month  She relates she will be seeing a pain medicine specialist but she requests a short prescription of her hydrocodone 7.5 mg the drug registry was checked she was given 20 tablets  Blood pressure under good control with current medication.  Medicine to help blood pressure as well as her migraines She also takes her diuretic without difficulty  Patient's diabetes under good control she does try to watch her diet she is not able to exercise a lot she is tolerating the big toes well no low sugar spells  Patient has chronic cough with some shortness of breath has been a chest x-ray in the past which was negative she denies high fever chills sweats wheezing or difficulty breathing just intermittent shortness of breath uses albuterol as needed   Tingling for 6 months Results for orders placed or performed in visit on 12/21/17  POCT HgB A1C  Result Value Ref Range   Hemoglobin A1C 6.8 (A) 4.0 - 5.6 %   HbA1c POC (<> result, manual entry)     HbA1c, POC (prediabetic range)     HbA1c, POC (controlled diabetic range)      Review of Systems  Constitutional: Negative for activity change, appetite change and fatigue.  HENT: Negative for congestion and rhinorrhea.   Respiratory: Negative for cough and shortness of breath.   Cardiovascular: Negative for chest pain and leg swelling.  Gastrointestinal: Negative for abdominal pain and diarrhea.  Endocrine: Negative for polydipsia and polyphagia.  Skin: Negative for color change.  Neurological: Negative for dizziness and weakness.  Psychiatric/Behavioral: Negative for behavioral problems and confusion.       Objective:   Physical Exam  Constitutional: She appears well-nourished. No distress.  HENT:  Head: Normocephalic and atraumatic.  Eyes: Right eye exhibits no discharge. Left eye exhibits no discharge.  Neck: No tracheal deviation present.  Cardiovascular: Normal rate, regular rhythm and normal heart sounds.  No murmur heard. Pulmonary/Chest: Effort normal and breath sounds normal. No respiratory distress.  Musculoskeletal: She exhibits no edema.  Lymphadenopathy:    She has no cervical adenopathy.  Neurological: She is alert. Coordination normal.  Skin: Skin is warm and dry.  Psychiatric: She has a normal mood and affect. Her behavior is normal.  Vitals reviewed.  Diabetic foot exam normal.       Assessment & Plan:  The patient was seen today as part of a comprehensive visit for diabetes. The importance of keeping her A1c at or  below 7 was discussed.  Importance of regular physical activity was discussed.   The importance of adherence to medication as well as a controlled low starch/sugar diet was also discussed.  Standard follow-up visit recommended.  Also patient aware failure to keep diabetes under control increases the risk of complications.  Neuropathy more than likely related to Topamax but could be a underlying  problem she is Artie had her B12 checked she will hold off Topamax for the next month she will also do nerve conduction studies of the hip exam the feet to make sure there is not a polyneuropathy going on  HTN- Patient was seen today as part of a visit regarding hypertension. The importance of healthy diet and regular physical activity was discussed. The importance of compliance with medications discussed.  Ideal goal is to keep blood pressure low elevated levels certainly below 140/90 when possible.  The patient was counseled that keeping blood pressure under control lessen his risk of complications.  The importance of regular follow-ups was discussed with the patient.  Low-salt diet such as DASH recommended.  Regular physical activity was recommended as well.  Patient was advised to keep regular follow-ups.  Mild shortness of breath with intermittent coughing more than likely a lung related issue I do believe the patient would benefit from having pulmonary function testing completed.  May need further medicines or potential work-up with pulmonary  25 minutes was spent with the patient.  This statement verifies that 25 minutes was indeed spent with the patient.  More than 50% of this visit-total duration of the visit-was spent in counseling and coordination of care. The issues that the patient came in for today as reflected in the diagnosis (s) please refer to documentation for further details.  The patient was seen today as part of an evaluation regarding hyperlipidemia.  Recent lab work has been reviewed with the patient as well as the goals for good cholesterol care.  In addition to this medications have been discussed the importance of compliance with diet and medications discussed as well.  Finally the patient is aware that poor control of cholesterol, noncompliance can dramatically increase the risk of complications. The patient will keep regular office visits and the patient does agreed to  periodic lab work. Labs were ordered  Follow-up 6 months

## 2017-12-21 NOTE — Patient Instructions (Signed)
Per Dr.Scott, recommends stopping Topamax for one month to see if it helps with tingling.

## 2017-12-22 DIAGNOSIS — I10 Essential (primary) hypertension: Secondary | ICD-10-CM | POA: Diagnosis not present

## 2017-12-22 DIAGNOSIS — E785 Hyperlipidemia, unspecified: Secondary | ICD-10-CM | POA: Diagnosis not present

## 2017-12-23 ENCOUNTER — Encounter: Payer: Self-pay | Admitting: Family Medicine

## 2017-12-23 LAB — BASIC METABOLIC PANEL
BUN / CREAT RATIO: 19 (ref 9–23)
BUN: 16 mg/dL (ref 6–24)
CO2: 28 mmol/L (ref 20–29)
CREATININE: 0.83 mg/dL (ref 0.57–1.00)
Calcium: 9.9 mg/dL (ref 8.7–10.2)
Chloride: 96 mmol/L (ref 96–106)
GFR, EST AFRICAN AMERICAN: 92 mL/min/{1.73_m2} (ref >59)
GFR, EST NON AFRICAN AMERICAN: 80 mL/min/{1.73_m2} (ref >59)
Glucose: 129 mg/dL — ABNORMAL HIGH (ref 65–99)
Potassium: 4.1 mmol/L (ref 3.5–5.2)
SODIUM: 138 mmol/L (ref 134–144)

## 2017-12-23 LAB — LIPID PANEL
CHOLESTEROL TOTAL: 134 mg/dL (ref 100–199)
Chol/HDL Ratio: 4.3 ratio (ref 0.0–4.4)
HDL: 31 mg/dL — ABNORMAL LOW (ref >39)
LDL Calculated: 77 mg/dL (ref 0–99)
TRIGLYCERIDES: 132 mg/dL (ref 0–149)
VLDL CHOLESTEROL CAL: 26 mg/dL (ref 5–40)

## 2017-12-25 ENCOUNTER — Emergency Department (HOSPITAL_COMMUNITY): Payer: Medicare HMO

## 2017-12-25 ENCOUNTER — Emergency Department (HOSPITAL_COMMUNITY)
Admission: EM | Admit: 2017-12-25 | Discharge: 2017-12-25 | Disposition: A | Discharge status: Home / Self Care | Payer: Medicare HMO | Attending: Emergency Medicine | Admitting: Emergency Medicine

## 2017-12-25 ENCOUNTER — Encounter (HOSPITAL_COMMUNITY): Payer: Self-pay

## 2017-12-25 ENCOUNTER — Other Ambulatory Visit: Payer: Self-pay

## 2017-12-25 DIAGNOSIS — Y9301 Activity, walking, marching and hiking: Secondary | ICD-10-CM | POA: Insufficient documentation

## 2017-12-25 DIAGNOSIS — I10 Essential (primary) hypertension: Secondary | ICD-10-CM | POA: Diagnosis not present

## 2017-12-25 DIAGNOSIS — R51 Headache: Secondary | ICD-10-CM | POA: Insufficient documentation

## 2017-12-25 DIAGNOSIS — Y999 Unspecified external cause status: Secondary | ICD-10-CM | POA: Diagnosis not present

## 2017-12-25 DIAGNOSIS — Z7982 Long term (current) use of aspirin: Secondary | ICD-10-CM | POA: Diagnosis not present

## 2017-12-25 DIAGNOSIS — M79671 Pain in right foot: Secondary | ICD-10-CM

## 2017-12-25 DIAGNOSIS — M25561 Pain in right knee: Secondary | ICD-10-CM | POA: Diagnosis not present

## 2017-12-25 DIAGNOSIS — Z79899 Other long term (current) drug therapy: Secondary | ICD-10-CM | POA: Diagnosis not present

## 2017-12-25 DIAGNOSIS — Y929 Unspecified place or not applicable: Secondary | ICD-10-CM | POA: Diagnosis not present

## 2017-12-25 DIAGNOSIS — S8991XA Unspecified injury of right lower leg, initial encounter: Secondary | ICD-10-CM | POA: Diagnosis not present

## 2017-12-25 DIAGNOSIS — S0990XA Unspecified injury of head, initial encounter: Secondary | ICD-10-CM | POA: Diagnosis not present

## 2017-12-25 DIAGNOSIS — E119 Type 2 diabetes mellitus without complications: Secondary | ICD-10-CM | POA: Insufficient documentation

## 2017-12-25 DIAGNOSIS — W19XXXA Unspecified fall, initial encounter: Secondary | ICD-10-CM

## 2017-12-25 DIAGNOSIS — W0110XA Fall on same level from slipping, tripping and stumbling with subsequent striking against unspecified object, initial encounter: Secondary | ICD-10-CM | POA: Insufficient documentation

## 2017-12-25 DIAGNOSIS — S99921A Unspecified injury of right foot, initial encounter: Secondary | ICD-10-CM | POA: Diagnosis not present

## 2017-12-25 LAB — CBC WITH DIFFERENTIAL/PLATELET
Abs Immature Granulocytes: 0.01 10*3/uL (ref 0.00–0.07)
Basophils Absolute: 0 10*3/uL (ref 0.0–0.1)
Basophils Relative: 1 %
Eosinophils Absolute: 0.2 10*3/uL (ref 0.0–0.5)
Eosinophils Relative: 3 %
HCT: 41.3 % (ref 36.0–46.0)
Hemoglobin: 13.1 g/dL (ref 12.0–15.0)
Immature Granulocytes: 0 %
Lymphocytes Relative: 27 %
Lymphs Abs: 1.9 10*3/uL (ref 0.7–4.0)
MCH: 28.5 pg (ref 26.0–34.0)
MCHC: 31.7 g/dL (ref 30.0–36.0)
MCV: 90 fL (ref 80.0–100.0)
Monocytes Absolute: 0.5 10*3/uL (ref 0.1–1.0)
Monocytes Relative: 7 %
Neutro Abs: 4.5 10*3/uL (ref 1.7–7.7)
Neutrophils Relative %: 62 %
Platelets: 296 10*3/uL (ref 150–400)
RBC: 4.59 MIL/uL (ref 3.87–5.11)
RDW: 13.2 % (ref 11.5–15.5)
WBC: 7.1 10*3/uL (ref 4.0–10.5)
nRBC: 0 % (ref 0.0–0.2)

## 2017-12-25 LAB — COMPREHENSIVE METABOLIC PANEL
ALK PHOS: 67 U/L (ref 38–126)
ALT: 18 U/L (ref 0–44)
AST: 21 U/L (ref 15–41)
Albumin: 4.2 g/dL (ref 3.5–5.0)
Anion gap: 9 (ref 5–15)
BUN: 12 mg/dL (ref 6–20)
CALCIUM: 9.6 mg/dL (ref 8.9–10.3)
CO2: 28 mmol/L (ref 22–32)
CREATININE: 0.73 mg/dL (ref 0.44–1.00)
Chloride: 99 mmol/L (ref 98–111)
Glucose, Bld: 174 mg/dL — ABNORMAL HIGH (ref 70–99)
Potassium: 3.6 mmol/L (ref 3.5–5.1)
Sodium: 136 mmol/L (ref 135–145)
Total Bilirubin: 0.5 mg/dL (ref 0.3–1.2)
Total Protein: 8.1 g/dL (ref 6.5–8.1)

## 2017-12-25 LAB — URINALYSIS, ROUTINE W REFLEX MICROSCOPIC
Bilirubin Urine: NEGATIVE
Glucose, UA: NEGATIVE mg/dL
Hgb urine dipstick: NEGATIVE
Ketones, ur: NEGATIVE mg/dL
Leukocytes, UA: NEGATIVE
Nitrite: NEGATIVE
Protein, ur: NEGATIVE mg/dL
Specific Gravity, Urine: 1.012 (ref 1.005–1.030)
pH: 6 (ref 5.0–8.0)

## 2017-12-25 MED ORDER — SODIUM CHLORIDE 0.9 % IV BOLUS
500.0000 mL | Freq: Once | INTRAVENOUS | Status: AC
  Administered 2017-12-25: 500 mL via INTRAVENOUS

## 2017-12-25 MED ORDER — HYDROCODONE-ACETAMINOPHEN 5-325 MG PO TABS: 1.0000 | ORAL_TABLET | ORAL | 0 refills | Status: DC | PRN

## 2017-12-25 MED ORDER — FENTANYL CITRATE (PF) 100 MCG/2ML IJ SOLN
50.0000 ug | Freq: Once | INTRAMUSCULAR | Status: AC
  Administered 2017-12-25: 50 ug via INTRAVENOUS
  Filled 2017-12-25: qty 2

## 2017-12-25 NOTE — ED Provider Notes (Signed)
[facility] EMERGENCY DEPARTMENT Provider Note   CSN: [id number] Arrival date & time: 12/25/17  1407     History   Chief Complaint Chief Complaint  Patient presents with  . Fall  . Headache  . Knee Pain    HPI [name] is a 52 y.o. female.  Status post accidental fall yesterday hyperextending her right knee and hitting the back of her head.  She may have lost consciousness but not sure.  No residual neurological deficits.  Now complains of right knee, right foot, occipital pain.  No other extremity injuries.  Severity of symptoms is moderate.  Palpation and positioning make pain worse.     Past Medical History:  Diagnosis Date  . Allergy   . Anxiety   . Arthritis   . Diabetes mellitus without complication (HCC)   . Folliculitis 07/20/2012  . Hidradenitis suppurativa   . Hypertension   . Migraine headache   . Pulmonary nodule   . Sleep apnea     Patient Active Problem List   Diagnosis Date Noted  . Herpes labialis 07/20/2017  . Depression, major, single episode, moderate (HCC) 07/21/2016  . Chronic bilateral low back pain 12/02/2015  . Dysphagia 09/11/2015  . Gastroesophageal reflux disease without esophagitis 07/24/2015  . Colon cancer screening   . Encounter for screening colonoscopy 03/14/2015  . Insomnia 11/26/2014  . Varicose veins 11/26/2014  . Osteoarthritis of both knees 08/28/2014  . Sleep apnea 05/25/2014  . Depression 02/04/2014  . Skin abscess 12/01/2012  . Hidradenitis suppurativa 08/03/2012  . Diabetes (HCC) 07/20/2012  . Folliculitis 07/20/2012  . Migraine 06/04/2008  . Essential hypertension 06/04/2008  . CONSTIPATION, CHRONIC 06/04/2008  . Chronic arthralgias of knees and hips 06/04/2008  . ABDOMINAL PAIN 06/04/2008    Past Surgical History:  Procedure Laterality Date  . ABDOMINAL HYSTERECTOMY    . COLONOSCOPY WITH PROPOFOL N/A 04/08/2015   Dr. Jena Gauss: normal, repeat in 10 years   . ENDOMETRIAL ABLATION    . FOREIGN BODY  REMOVAL N/A 05/26/2013   Procedure: FOREIGN BODY REMOVAL ADULT ABDOMINAL WALL;  Surgeon: Dalia Heading, MD;  Location: [facility];  Service: General;  Laterality: N/A;  . KNEE SURGERY Right 2012  . PILONIDAL CYST EXCISION  June 2011     OB History    Gravida  1   Para  1   Term      Preterm      AB      Living  1     SAB      TAB      Ectopic      Multiple      Live Births  1            Home Medications    Prior to Admission medications   Medication Sig Start Date End Date Taking? Authorizing Provider  albuterol (PROVENTIL HFA;VENTOLIN HFA) 108 (90 Base) MCG/ACT inhaler Inhale 2 puffs into the lungs every 4 (four) hours as needed. 12/11/16  Yes Campbell Riches, NP  aspirin EC 81 MG tablet Take 81 mg by mouth daily.   Yes [provider]  cyclobenzaprine (FLEXERIL) 10 MG tablet Take 1 tablet (10 mg total) by mouth 3 (three) times daily as needed for muscle spasms. 06/17/15  Yes Pollina, Canary Brim, MD  DULoxetine (CYMBALTA) 60 MG capsule Take one po qd Patient taking differently: Take 60 mg by mouth daily. Take one po qd 07/20/17  Yes Campbell Riches, NP  fluticasone (  FLONASE) 50 MCG/ACT nasal spray Place 2 sprays into both nostrils daily as needed for allergies or rhinitis. 12/11/16  Yes Campbell RichesHoskins, Carolyn C, NP  HYDROcodone-acetaminophen (NORCO) 7.5-325 MG tablet Take 1 tablet by mouth every 6 (six) hours as needed for up to 5 days. 12/21/17 12/26/17 Yes Babs SciaraLuking, Scott A, MD  LINZESS 145 MCG CAPS capsule TAKE ONE CAPSULE BY MOUTH ONCE DAILY 10/24/16  Yes Anice PaganiniGill, Eric A, NP  liraglutide (VICTOZA) 18 MG/3ML SOPN Inject 0.3 mLs (1.8 mg total) into the skin daily. 09/17/17  Yes Babs SciaraLuking, Scott A, MD  loratadine (CLARITIN) 10 MG tablet Take 1 tablet (10 mg total) by mouth daily. 10/25/17  Yes Luking, Scott A, MD  LYRICA 75 MG capsule Take 75 mg by mouth daily.  11/27/16  Yes [provider]  metoprolol tartrate (LOPRESSOR) 100 MG tablet Take 1 tablet (100 mg  total) by mouth 2 (two) times daily. 07/20/17  Yes Campbell RichesHoskins, Carolyn C, NP  ranitidine (ZANTAC) 300 MG tablet Take 1 tablet (300 mg total) by mouth at bedtime. Prn acid reflux 07/20/17  Yes Campbell RichesHoskins, Carolyn C, NP  RELION PEN NEEDLES 31G X 6 MM MISC AS DIRECTED FOR  VICTOZA  PEN 12/21/17  Yes Babs SciaraLuking, Scott A, MD  topiramate (TOPAMAX) 50 MG tablet Take one po at bedtime for migraines 07/20/17  Yes Campbell RichesHoskins, Carolyn C, NP  traZODone (DESYREL) 100 MG tablet Take 1 tablet (100 mg total) by mouth at bedtime. 08/26/15  Yes Luking, Jonna CoupScott A, MD  triamterene-hydrochlorothiazide (MAXZIDE-25) 37.5-25 MG tablet Take 1 tablet by mouth daily. 12/17/17  Yes Babs SciaraLuking, Scott A, MD  valACYclovir (VALTREX) 1000 MG tablet Take 2 po x 2 doses 12 hours apart prn fever blisters 07/20/17  Yes Campbell RichesHoskins, Carolyn C, NP    Family History Family History  Problem Relation Age of Onset  . Stroke Mother   . Hypertension Mother   . Thyroid disease Father   . Hypertension Sister   . Hypertension Brother   . Cancer Paternal Grandfather        prostate  . Colon cancer Maternal Grandfather     Social History Social History   Tobacco Use  . Smoking status: Never Smoker  . Smokeless tobacco: Never Used  Substance Use Topics  . Alcohol use: No    Alcohol/week: 0.0 standard drinks  . Drug use: No     Allergies   Bactrim [sulfamethoxazole-trimethoprim]; Lisinopril; Other; and Metformin and related   Review of Systems Review of Systems  All other systems reviewed and are negative.    Physical Exam Updated Vital Signs Ht 5\' 6"  (1.676 m)   Wt 99.8 kg   BMI 35.51 kg/m   Physical Exam  Constitutional: She is oriented to person, place, and time. She appears well-developed and well-nourished.  nad  HENT:  Head: Normocephalic.  Tender occipital area.  Eyes: Conjunctivae are normal.  Neck: Neck supple.  Cardiovascular: Normal rate and regular rhythm.  Pulmonary/Chest: Effort normal and breath sounds normal.    Abdominal: Soft. Bowel sounds are normal.  Musculoskeletal:  Right knee: Tender anterior aspect.  Right foot: Tender dorsal aspect.  Neurological: She is alert and oriented to person, place, and time.  Skin: Skin is warm and dry.  Psychiatric: She has a normal mood and affect. Her behavior is normal.  Nursing note and vitals reviewed.    ED Treatments / Results  Labs (all labs ordered are listed, but only abnormal results are displayed) Labs Reviewed  COMPREHENSIVE METABOLIC PANEL - Abnormal;  Notable for the following components:      Result Value   Glucose, Bld 174 (*)    All other components within normal limits  CBC WITH DIFFERENTIAL/PLATELET  URINALYSIS, ROUTINE W REFLEX MICROSCOPIC    EKG EKG Interpretation  Date/Time:  Saturday December 25 2017 15:16:18 EST Ventricular Rate:  56 PR Interval:    QRS Duration: 83 QT Interval:  421 QTC Calculation: 407 R Axis:   60 Text Interpretation:  Sinus rhythm Confirmed by Donnetta Hutching ([id number]) on 12/25/2017 3:25:18 PM   Radiology Ct Head Wo Contrast  Result Date: 12/25/2017 CLINICAL DATA:  Fall, hit head. EXAM: CT HEAD WITHOUT CONTRAST TECHNIQUE: Contiguous axial images were obtained from the base of the skull through the vertex without intravenous contrast. COMPARISON:  06/17/2015 FINDINGS: Brain: No acute intracranial abnormality. Specifically, no hemorrhage, hydrocephalus, mass lesion, acute infarction, or significant intracranial injury. Vascular: No hyperdense vessel or unexpected calcification. Skull: No acute calvarial abnormality. Sinuses/Orbits: Visualized paranasal sinuses and mastoids clear. Orbital soft tissues unremarkable. Other: None IMPRESSION: Normal study. Electronically Signed   By: Charlett Nose M.D.   On: 12/25/2017 16:07   Dg Knee Complete 4 Views Right  Result Date: 12/25/2017 CLINICAL DATA:  Right knee pain following fall, initial encounter EXAM: RIGHT KNEE - COMPLETE 4+ VIEW COMPARISON:  MRI from  04/14/2010 FINDINGS: Tricompartmental degenerative changes are noted worst in the medial joint space. No joint effusion is seen. No acute fracture or dislocation is noted. IMPRESSION: Degenerative changes without acute abnormality. Electronically Signed   By: Alcide Clever M.D.   On: 12/25/2017 16:13   Dg Foot Complete Right  Result Date: 12/25/2017 CLINICAL DATA:  Recent fall with foot pain, initial encounter EXAM: RIGHT FOOT COMPLETE - 3+ VIEW COMPARISON:  None. FINDINGS: No acute fracture or dislocation is noted. Calcaneal spurring is noted. No soft tissue abnormality is seen. IMPRESSION: No acute abnormality noted. Electronically Signed   By: Alcide Clever M.D.   On: 12/25/2017 16:14    Procedures Procedures (including critical care time)  Medications Ordered in ED Medications  sodium chloride 0.9 % bolus 500 mL (0 mLs Intravenous Stopped 12/25/17 1543)  fentaNYL (SUBLIMAZE) injection 50 mcg (50 mcg Intravenous Given 12/25/17 1519)     Initial Impression / Assessment and Plan / ED Course  I have reviewed the triage vital signs and the nursing notes.  Pertinent labs & imaging results that were available during my care of the patient were reviewed by me and considered in my medical decision making (see chart for details).     Patient presents after a fall yesterday.  Work-up including labs, EKG, CT head, plain films of right knee and right foot all negative.  Patient hemodynamically stable at discharge.  Final Clinical Impressions(s) / ED Diagnoses   Final diagnoses:  Fall, initial encounter  Acute pain of right knee  Right foot pain    ED Discharge Orders    None       Donnetta Hutching, MD 12/25/17 1731

## 2017-12-25 NOTE — Discharge Instructions (Addendum)
Tests were good.   Follow-up your primary care doctor. °

## 2017-12-25 NOTE — ED Triage Notes (Signed)
Pt walking in house and reports she fell. She states she hit her head and fell on her right knee. States head us hurting as well as right knee. Denies LOC

## 2017-12-25 NOTE — ED Notes (Signed)
Purewick placed on pt. 

## 2017-12-28 ENCOUNTER — Encounter: Payer: Self-pay | Admitting: Family Medicine

## 2017-12-28 ENCOUNTER — Other Ambulatory Visit: Payer: Self-pay

## 2017-12-28 ENCOUNTER — Telehealth: Payer: Self-pay | Admitting: Family Medicine

## 2017-12-28 DIAGNOSIS — R69 Illness, unspecified: Secondary | ICD-10-CM | POA: Diagnosis not present

## 2017-12-28 MED ORDER — ALBUTEROL SULFATE HFA 108 (90 BASE) MCG/ACT IN AERS: 2.0000 | INHALATION_SPRAY | RESPIRATORY_TRACT | 5 refills | Status: DC | PRN

## 2017-12-28 MED ORDER — BLOOD GLUCOSE METER KIT: PACK | 11 refills | Status: DC

## 2017-12-28 NOTE — Telephone Encounter (Signed)
Pt was seen on 11/12 and requested her lancets and test strips be sent to [facility] - [place], [place] - [address] An inhaler was also suppose to be sent to [facility] - , [place] - [address]   She would also like to check on the status of "coughing test" that was suppose to be set up at [facility].   CB# [phone]

## 2017-12-28 NOTE — Telephone Encounter (Signed)
Please advise 

## 2017-12-28 NOTE — Telephone Encounter (Signed)
Pt contacted to inform that supplies script will be sent over to [facility]. [facility]. Tried to contact [facility] at [facility]; no answer. Spoke with Enid DerryBrendale and she is not sure so she sent a email to short stay to see if they know who to contact to inquire about PFT. Pt verbalized understanding. Pt would like her cell phone to be called.

## 2017-12-28 NOTE — Telephone Encounter (Signed)
1.  She may have lancets strips and any supplies she needs she may test once per day based on her medication, 2 diabetes This may be done for her monthly as well as 12 refills  As for an inhaler she may have an inhaler along with 5 refills  Please also look into pulmonary function testing which was ordered on her last visit please give her an update and help her out thanks

## 2018-01-09 DIAGNOSIS — J45909 Unspecified asthma, uncomplicated: Secondary | ICD-10-CM

## 2018-01-09 HISTORY — DX: Unspecified asthma, uncomplicated: J45.909

## 2018-01-12 ENCOUNTER — Ambulatory Visit (HOSPITAL_COMMUNITY)
Admission: RE | Admit: 2018-01-12 | Discharge: 2018-01-12 | Disposition: A | Discharge status: Home / Self Care | Payer: Medicare HMO | Source: Ambulatory Visit | Attending: Family Medicine | Admitting: Family Medicine

## 2018-01-12 DIAGNOSIS — R0602 Shortness of breath: Secondary | ICD-10-CM | POA: Insufficient documentation

## 2018-01-12 DIAGNOSIS — R05 Cough: Secondary | ICD-10-CM | POA: Insufficient documentation

## 2018-01-12 DIAGNOSIS — R053 Chronic cough: Secondary | ICD-10-CM

## 2018-01-12 LAB — PULMONARY FUNCTION TEST
DL/VA % pred: 116 %
DL/VA: 5.87 ml/min/mmHg/L
DLCO UNC % PRED: 95 %
DLCO UNC: 25.66 ml/min/mmHg
DLCO cor % pred: 96 %
DLCO cor: 25.9 ml/min/mmHg
FEF 25-75 Post: 1.73 L/sec
FEF 25-75 Pre: 1.2 L/sec
FEF2575-%Change-Post: 44 %
FEF2575-%Pred-Post: 70 %
FEF2575-%Pred-Pre: 49 %
FEV1-%CHANGE-POST: 21 %
FEV1-%PRED-POST: 82 %
FEV1-%Pred-Pre: 67 %
FEV1-POST: 2 L
FEV1-PRE: 1.64 L
FEV1FVC-%Change-Post: 21 %
FEV1FVC-%Pred-Pre: 77 %
FEV6-%Change-Post: 2 %
FEV6-%PRED-POST: 86 %
FEV6-%PRED-PRE: 84 %
FEV6-POST: 2.56 L
FEV6-Pre: 2.49 L
FEV6FVC-%CHANGE-POST: 0 %
FEV6FVC-%PRED-POST: 102 %
FEV6FVC-%Pred-Pre: 102 %
FVC-%Change-Post: 0 %
FVC-%Pred-Post: 86 %
FVC-%Pred-Pre: 86 %
FVC-Post: 2.62 L
FVC-Pre: 2.62 L
PRE FEV6/FVC RATIO: 100 %
Post FEV1/FVC ratio: 76 %
Post FEV6/FVC ratio: 100 %
Pre FEV1/FVC ratio: 63 %

## 2018-01-12 MED ORDER — ALBUTEROL SULFATE (2.5 MG/3ML) 0.083% IN NEBU
2.5000 mg | INHALATION_SOLUTION | Freq: Once | RESPIRATORY_TRACT | Status: AC
  Administered 2018-01-12: 2.5 mg via RESPIRATORY_TRACT

## 2018-01-18 ENCOUNTER — Other Ambulatory Visit: Payer: Self-pay | Admitting: Nurse Practitioner

## 2018-01-18 DIAGNOSIS — M47816 Spondylosis without myelopathy or radiculopathy, lumbar region: Secondary | ICD-10-CM | POA: Diagnosis not present

## 2018-01-18 DIAGNOSIS — M545 Low back pain: Secondary | ICD-10-CM | POA: Diagnosis not present

## 2018-01-18 DIAGNOSIS — M5416 Radiculopathy, lumbar region: Secondary | ICD-10-CM | POA: Diagnosis not present

## 2018-01-19 NOTE — Telephone Encounter (Signed)
Limited refills. Patient needs ov. 

## 2018-01-20 ENCOUNTER — Encounter: Payer: Self-pay | Admitting: Internal Medicine

## 2018-01-20 NOTE — Telephone Encounter (Signed)
SCHEDULED AND LETTER SENT  °

## 2018-01-27 DIAGNOSIS — M545 Low back pain: Secondary | ICD-10-CM | POA: Diagnosis not present

## 2018-01-27 DIAGNOSIS — M5416 Radiculopathy, lumbar region: Secondary | ICD-10-CM | POA: Diagnosis not present

## 2018-01-28 ENCOUNTER — Other Ambulatory Visit: Payer: Self-pay | Admitting: *Deleted

## 2018-01-28 MED ORDER — FLUTICASONE PROPIONATE HFA 110 MCG/ACT IN AERO: 2.0000 | INHALATION_SPRAY | Freq: Two times a day (BID) | RESPIRATORY_TRACT | 5 refills | Status: DC

## 2018-02-06 ENCOUNTER — Telehealth: Payer: Self-pay | Admitting: Family Medicine

## 2018-02-06 NOTE — Telephone Encounter (Signed)
Nurses Please communicate with patient Her insurance company sent us a notification that patient is on ranitidine Certain manufacturers of ranitidine have recalled the product because of potential impurities in the ranitidine that could increase risk of cancer What the patient should do- She should go to her pharmacy and talk with them to find out if the ranitidine they issued her was recalled If it was recalled she should discuss with us switching to a different product If her pharmacy states that the supplier they are using does not have trouble with ranitidine then she may continue using

## 2018-02-07 MED ORDER — FAMOTIDINE 20 MG PO TABS: 20.0000 mg | ORAL_TABLET | Freq: Every day | ORAL | 1 refills | Status: DC

## 2018-02-07 NOTE — Telephone Encounter (Signed)
Patient is aware [facility].

## 2018-02-07 NOTE — Addendum Note (Signed)
Addended by: Meredith LeedsSUTTON, CRYSTAL L on: 02/07/2018 09:44 AM   Modules accepted: Orders

## 2018-02-07 NOTE — Telephone Encounter (Signed)
So in this situation I recommend Famotidine 20 mg, 1 daily, may have 30-day supply with 5 refills If her insurance prefers 90-day she can do that with a refill It is important and put into the note for the pharmacy that we are stopping ranitidine

## 2018-02-07 NOTE — Telephone Encounter (Signed)
Patient states the recall has effected her medication and she states she is comfortable with what ever you decide to replace it.

## 2018-02-14 DIAGNOSIS — M79673 Pain in unspecified foot: Secondary | ICD-10-CM | POA: Diagnosis not present

## 2018-02-14 DIAGNOSIS — R208 Other disturbances of skin sensation: Secondary | ICD-10-CM | POA: Diagnosis not present

## 2018-02-14 DIAGNOSIS — E1140 Type 2 diabetes mellitus with diabetic neuropathy, unspecified: Secondary | ICD-10-CM | POA: Diagnosis not present

## 2018-02-14 DIAGNOSIS — M545 Low back pain: Secondary | ICD-10-CM | POA: Diagnosis not present

## 2018-02-16 DIAGNOSIS — E039 Hypothyroidism, unspecified: Secondary | ICD-10-CM | POA: Diagnosis not present

## 2018-02-16 DIAGNOSIS — Z79899 Other long term (current) drug therapy: Secondary | ICD-10-CM | POA: Diagnosis not present

## 2018-02-16 DIAGNOSIS — D519 Vitamin B12 deficiency anemia, unspecified: Secondary | ICD-10-CM | POA: Diagnosis not present

## 2018-02-16 DIAGNOSIS — E559 Vitamin D deficiency, unspecified: Secondary | ICD-10-CM | POA: Diagnosis not present

## 2018-02-16 DIAGNOSIS — R5383 Other fatigue: Secondary | ICD-10-CM | POA: Diagnosis not present

## 2018-03-21 ENCOUNTER — Ambulatory Visit (INDEPENDENT_AMBULATORY_CARE_PROVIDER_SITE_OTHER): Payer: Medicare HMO | Admitting: Family Medicine

## 2018-03-21 ENCOUNTER — Ambulatory Visit: Payer: Medicare HMO | Admitting: Family Medicine

## 2018-03-21 ENCOUNTER — Encounter: Payer: Self-pay | Admitting: Family Medicine

## 2018-03-21 VITALS — BP 148/90 | Temp 98.3°F | Ht 66.0 in | Wt 218.0 lb

## 2018-03-21 DIAGNOSIS — J329 Chronic sinusitis, unspecified: Secondary | ICD-10-CM

## 2018-03-21 MED ORDER — CEFPROZIL 500 MG PO TABS: 500.0000 mg | ORAL_TABLET | Freq: Two times a day (BID) | ORAL | 0 refills | Status: DC

## 2018-03-21 NOTE — Progress Notes (Signed)
   Subjective:    Patient ID: [name], female    DOB: December 27, 1965, 53 y.o.   MRN: [id number]  Sinusitis  This is a new problem. Episode onset: 4 days ago. Associated symptoms include congestion, coughing, ear pain, headaches and a sore throat. (Abdominal pain)   Pt had bad headache the first four days  Got very sixk on sund, coughing and nauseated, boy hurting, throat hurting the entire time  Pos rod cough green yellow   Headache frontal , worse with cough, pos gunk nasal disch  Energy level zero   Appetite not good    Review of Systems  HENT: Positive for congestion, ear pain and sore throat.   Respiratory: Positive for cough.   Neurological: Positive for headaches.       Objective:   Physical Exam  Alert, mild malaise. Hydration good Vitals stable. frontal/ maxillary tenderness evident positive nasal congestion. pharynx normal neck supple  lungs clear/no crackles some mildwheezes. heart regular in rhythm       Assessment & Plan:  Impression rhinosinusitis/bronchitis, reac airways so encouraged to use albuterol prn and flovent bid faithfully, likely post viral, discussed with patient. plan antibiotics prescribed. Questions answered. Symptomatic care discussed. warning signs discussed. WSL

## 2018-03-28 ENCOUNTER — Telehealth: Payer: Self-pay | Admitting: Family Medicine

## 2018-03-28 ENCOUNTER — Ambulatory Visit: Payer: Medicare HMO | Admitting: Family Medicine

## 2018-03-28 ENCOUNTER — Ambulatory Visit (INDEPENDENT_AMBULATORY_CARE_PROVIDER_SITE_OTHER): Payer: Medicare HMO | Admitting: Family Medicine

## 2018-03-28 VITALS — BP 158/92 | Ht 66.0 in | Wt 217.4 lb

## 2018-03-28 DIAGNOSIS — J019 Acute sinusitis, unspecified: Secondary | ICD-10-CM | POA: Diagnosis not present

## 2018-03-28 DIAGNOSIS — R350 Frequency of micturition: Secondary | ICD-10-CM | POA: Diagnosis not present

## 2018-03-28 DIAGNOSIS — B9689 Other specified bacterial agents as the cause of diseases classified elsewhere: Secondary | ICD-10-CM | POA: Diagnosis not present

## 2018-03-28 LAB — POCT URINALYSIS DIPSTICK
SPEC GRAV UA: 1.02 (ref 1.010–1.025)
pH, UA: 6 (ref 5.0–8.0)

## 2018-03-28 MED ORDER — DOXYCYCLINE HYCLATE 100 MG PO TABS: 100.0000 mg | ORAL_TABLET | Freq: Two times a day (BID) | ORAL | 0 refills | Status: DC

## 2018-03-28 NOTE — Telephone Encounter (Signed)
Pt was seen 2/10 by Dr. Brett Canales. She is calling in to see if a different antibiotic could be called in or if she needs to have a recheck. She is still coughing, sore throat, congested, headache and frequent urination.   CB# [phone]  If able to call something else in please send to [facility] - [place], [place] - [address]

## 2018-03-28 NOTE — Patient Instructions (Signed)
Sinusitis, Adult  Sinusitis is inflammation of your sinuses. Sinuses are hollow spaces in the bones around your face. Your sinuses are located:   Around your eyes.   In the middle of your forehead.   Behind your nose.   In your cheekbones.  Mucus normally drains out of your sinuses. When your nasal tissues become inflamed or swollen, mucus can become trapped or blocked. This allows bacteria, viruses, and fungi to grow, which leads to infection. Most infections of the sinuses are caused by a virus.  Sinusitis can develop quickly. It can last for up to 4 weeks (acute) or for more than 12 weeks (chronic). Sinusitis often develops after a cold.  What are the causes?  This condition is caused by anything that creates swelling in the sinuses or stops mucus from draining. This includes:   Allergies.   Asthma.   Infection from bacteria or viruses.   Deformities or blockages in your nose or sinuses.   Abnormal growths in the nose (nasal polyps).   Pollutants, such as chemicals or irritants in the air.   Infection from fungi (rare).  What increases the risk?  You are more likely to develop this condition if you:   Have a weak body defense system (immune system).   Do a lot of swimming or diving.   Overuse nasal sprays.   Smoke.  What are the signs or symptoms?  The main symptoms of this condition are pain and a feeling of pressure around the affected sinuses. Other symptoms include:   Stuffy nose or congestion.   Thick drainage from your nose.   Swelling and warmth over the affected sinuses.   Headache.   Upper toothache.   A cough that may get worse at night.   Extra mucus that collects in the throat or the back of the nose (postnasal drip).   Decreased sense of smell and taste.   Fatigue.   A fever.   Sore throat.   Bad breath.  How is this diagnosed?  This condition is diagnosed based on:   Your symptoms.   Your medical history.   A physical exam.   Tests to find out if your condition is  acute or chronic. This may include:  ? Checking your nose for nasal polyps.  ? Viewing your sinuses using a device that has a light (endoscope).  ? Testing for allergies or bacteria.  ? Imaging tests, such as an MRI or CT scan.  In rare cases, a bone biopsy may be done to rule out more serious types of fungal sinus disease.  How is this treated?  Treatment for sinusitis depends on the cause and whether your condition is chronic or acute.   If caused by a virus, your symptoms should go away on their own within 10 days. You may be given medicines to relieve symptoms. They include:  ? Medicines that shrink swollen nasal passages (topical intranasal decongestants).  ? Medicines that treat allergies (antihistamines).  ? A spray that eases inflammation of the nostrils (topical intranasal corticosteroids).  ? Rinses that help get rid of thick mucus in your nose (nasal saline washes).   If caused by bacteria, your health care provider may recommend waiting to see if your symptoms improve. Most bacterial infections will get better without antibiotic medicine. You may be given antibiotics if you have:  ? A severe infection.  ? A weak immune system.   If caused by narrow nasal passages or nasal polyps, you may need   to have surgery.  Follow these instructions at home:  Medicines   Take, use, or apply over-the-counter and prescription medicines only as told by your health care provider. These may include nasal sprays.   If you were prescribed an antibiotic medicine, take it as told by your health care provider. Do not stop taking the antibiotic even if you start to feel better.  Hydrate and humidify     Drink enough fluid to keep your urine pale yellow. Staying hydrated will help to thin your mucus.   Use a cool mist humidifier to keep the humidity level in your home above 50%.   Inhale steam for 10-15 minutes, 3-4 times a day, or as told by your health care provider. You can do this in the bathroom while a hot shower is  running.   Limit your exposure to cool or dry air.  Rest   Rest as much as possible.   Sleep with your head raised (elevated).   Make sure you get enough sleep each night.  General instructions     Apply a warm, moist washcloth to your face 3-4 times a day or as told by your health care provider. This will help with discomfort.   Wash your hands often with soap and water to reduce your exposure to germs. If soap and water are not available, use hand sanitizer.   Do not smoke. Avoid being around people who are smoking (secondhand smoke).   Keep all follow-up visits as told by your health care provider. This is important.  Contact a health care provider if:   You have a fever.   Your symptoms get worse.   Your symptoms do not improve within 10 days.  Get help right away if:   You have a severe headache.   You have persistent vomiting.   You have severe pain or swelling around your face or eyes.   You have vision problems.   You develop confusion.   Your neck is stiff.   You have trouble breathing.  Summary   Sinusitis is soreness and inflammation of your sinuses. Sinuses are hollow spaces in the bones around your face.   This condition is caused by nasal tissues that become inflamed or swollen. The swelling traps or blocks the flow of mucus. This allows bacteria, viruses, and fungi to grow, which leads to infection.   If you were prescribed an antibiotic medicine, take it as told by your health care provider. Do not stop taking the antibiotic even if you start to feel better.   Keep all follow-up visits as told by your health care provider. This is important.  This information is not intended to replace advice given to you by your health care provider. Make sure you discuss any questions you have with your health care provider.  Document Released: 01/26/2005 Document Revised: 06/28/2017 Document Reviewed: 06/28/2017  Elsevier Interactive Patient Education  2019 [employer]

## 2018-03-28 NOTE — Telephone Encounter (Signed)
Pt was not seen for frequent urination last week so pt was given an appt for today

## 2018-03-28 NOTE — Progress Notes (Signed)
Subjective:    Patient ID: [name], female    DOB: [birth year], 52 y.o.   MRN: [id number]  HPI  Patient arrives for a follow up on on urine and flu.    Patient states she was seen last week for the flu and sinus infection and was given Cefzil and is not feeling any better - still has 3 days left. States body aches have improved. But still having sore throat and congestion and h/a. Still coughing a lot as well, productive of yellow/green phlegm. No fevers. No chills.  Has tried taking otc theraflu without relief.  States now having urinary frequency, No dysuria, no hematuria. Started about 5 days ago.     Review of Systems  Constitutional: Negative for chills and fever.  HENT: Positive for congestion, sinus pressure and sore throat. Negative for ear pain.   Eyes: Negative for discharge.  Respiratory: Positive for cough. Negative for shortness of breath and wheezing.   Gastrointestinal: Negative for diarrhea, nausea and vomiting.  Genitourinary: Positive for frequency. Negative for dysuria, flank pain and hematuria.       Objective:   Physical Exam Vitals signs and nursing note reviewed.  Constitutional:      General: She is not in acute distress.    Appearance: She is not toxic-appearing.  HENT:     Head: Normocephalic and atraumatic.     Right Ear: Tympanic membrane normal.     Left Ear: Tympanic membrane normal.     Nose: Congestion present.     Right Sinus: Maxillary sinus tenderness and frontal sinus tenderness present.     Left Sinus: Maxillary sinus tenderness and frontal sinus tenderness present.     Mouth/Throat:     Mouth: Mucous membranes are moist.     Pharynx: Oropharynx is clear.  Eyes:     General:        Right eye: No discharge.        Left eye: No discharge.  Neck:     Musculoskeletal: Neck supple. No neck rigidity.  Cardiovascular:     Rate and Rhythm: Normal rate and regular rhythm.     Heart sounds: Normal heart sounds.  Pulmonary:   Effort: Pulmonary effort is normal. No respiratory distress.     Breath sounds: Normal breath sounds. No wheezing or rales.  Lymphadenopathy:     Cervical: No cervical adenopathy.  Skin:    General: Skin is warm and dry.  Neurological:     Mental Status: She is alert and oriented to person, place, and time.      Results for orders placed or performed in visit on 03/28/18  POCT urinalysis dipstick  Result Value Ref Range   Color, UA     Clarity, UA     Glucose, UA     Bilirubin, UA     Ketones, UA     Spec Grav, UA 1.020 1.010 - 1.025   Blood, UA     pH, UA 6.0 5.0 - 8.0   Protein, UA     Urobilinogen, UA     Nitrite, UA     Leukocytes, UA     Appearance     Odor     Microscopic exam of urine: rare epithelial cell, no WBCs or RBCs seen     Assessment & Plan:  1. Acute bacterial rhinosinusitis Discussed likely post-viral sinusitis. Recommend stopping cefzil and changing to doxycycline. Symptomatic care discussed. Warning signs discussed. F/u if symptoms worsen or fail to improve.  2. Increased urinary frequency - Plan: POCT urinalysis dipstick No sign of infection on exam of urine today, no need for urine culture. Warning signs discussed with patient, f/u if symptoms worsen or fail to improve.   Dr. Lilyan Punt was consulted on this case and is in agreement with the above treatment plan.

## 2018-04-12 ENCOUNTER — Other Ambulatory Visit: Payer: Self-pay | Admitting: Family Medicine

## 2018-04-13 ENCOUNTER — Other Ambulatory Visit: Payer: Self-pay | Admitting: *Deleted

## 2018-04-13 MED ORDER — DULOXETINE HCL 60 MG PO CPEP: ORAL_CAPSULE | ORAL | 1 refills | Status: DC

## 2018-04-14 ENCOUNTER — Other Ambulatory Visit: Payer: Self-pay

## 2018-04-16 ENCOUNTER — Other Ambulatory Visit: Payer: Self-pay

## 2018-04-16 ENCOUNTER — Emergency Department (HOSPITAL_COMMUNITY): Payer: Medicare HMO

## 2018-04-16 ENCOUNTER — Emergency Department (HOSPITAL_COMMUNITY)
Admission: EM | Admit: 2018-04-16 | Discharge: 2018-04-16 | Disposition: A | Discharge status: Home / Self Care | Payer: Medicare HMO | Attending: Emergency Medicine | Admitting: Emergency Medicine

## 2018-04-16 ENCOUNTER — Encounter (HOSPITAL_COMMUNITY): Payer: Self-pay | Admitting: Emergency Medicine

## 2018-04-16 DIAGNOSIS — Z79899 Other long term (current) drug therapy: Secondary | ICD-10-CM | POA: Insufficient documentation

## 2018-04-16 DIAGNOSIS — J069 Acute upper respiratory infection, unspecified: Secondary | ICD-10-CM

## 2018-04-16 DIAGNOSIS — R0602 Shortness of breath: Secondary | ICD-10-CM | POA: Diagnosis present

## 2018-04-16 DIAGNOSIS — R0600 Dyspnea, unspecified: Secondary | ICD-10-CM | POA: Diagnosis not present

## 2018-04-16 DIAGNOSIS — E119 Type 2 diabetes mellitus without complications: Secondary | ICD-10-CM | POA: Insufficient documentation

## 2018-04-16 DIAGNOSIS — I10 Essential (primary) hypertension: Secondary | ICD-10-CM | POA: Diagnosis not present

## 2018-04-16 DIAGNOSIS — R05 Cough: Secondary | ICD-10-CM | POA: Diagnosis not present

## 2018-04-16 DIAGNOSIS — J45901 Unspecified asthma with (acute) exacerbation: Secondary | ICD-10-CM | POA: Diagnosis not present

## 2018-04-16 DIAGNOSIS — R638 Other symptoms and signs concerning food and fluid intake: Secondary | ICD-10-CM | POA: Insufficient documentation

## 2018-04-16 DIAGNOSIS — J4521 Mild intermittent asthma with (acute) exacerbation: Secondary | ICD-10-CM | POA: Diagnosis not present

## 2018-04-16 DIAGNOSIS — R1110 Vomiting, unspecified: Secondary | ICD-10-CM | POA: Insufficient documentation

## 2018-04-16 DIAGNOSIS — R0789 Other chest pain: Secondary | ICD-10-CM | POA: Diagnosis not present

## 2018-04-16 DIAGNOSIS — Z794 Long term (current) use of insulin: Secondary | ICD-10-CM | POA: Insufficient documentation

## 2018-04-16 HISTORY — DX: Cough: R05

## 2018-04-16 HISTORY — DX: Unspecified asthma, uncomplicated: J45.909

## 2018-04-16 HISTORY — DX: Chronic cough: R05.3

## 2018-04-16 LAB — CBC WITH DIFFERENTIAL/PLATELET
Abs Immature Granulocytes: 0.01 10*3/uL (ref 0.00–0.07)
BASOS ABS: 0 10*3/uL (ref 0.0–0.1)
Basophils Relative: 0 %
Eosinophils Absolute: 0.4 10*3/uL (ref 0.0–0.5)
Eosinophils Relative: 6 %
HCT: 43.2 % (ref 36.0–46.0)
Hemoglobin: 13.9 g/dL (ref 12.0–15.0)
Immature Granulocytes: 0 %
Lymphocytes Relative: 15 %
Lymphs Abs: 1.1 10*3/uL (ref 0.7–4.0)
MCH: 28.5 pg (ref 26.0–34.0)
MCHC: 32.2 g/dL (ref 30.0–36.0)
MCV: 88.5 fL (ref 80.0–100.0)
Monocytes Absolute: 0.8 10*3/uL (ref 0.1–1.0)
Monocytes Relative: 11 %
Neutro Abs: 4.8 10*3/uL (ref 1.7–7.7)
Neutrophils Relative %: 68 %
Platelets: 305 10*3/uL (ref 150–400)
RBC: 4.88 MIL/uL (ref 3.87–5.11)
RDW: 13.2 % (ref 11.5–15.5)
WBC: 7.1 10*3/uL (ref 4.0–10.5)
nRBC: 0 % (ref 0.0–0.2)

## 2018-04-16 LAB — TROPONIN I: Troponin I: <0.03 ng/mL (ref <0.03)

## 2018-04-16 LAB — BASIC METABOLIC PANEL
ANION GAP: 9 (ref 5–15)
BUN: 13 mg/dL (ref 6–20)
CO2: 28 mmol/L (ref 22–32)
Calcium: 9.6 mg/dL (ref 8.9–10.3)
Chloride: 99 mmol/L (ref 98–111)
Creatinine, Ser: 0.76 mg/dL (ref 0.44–1.00)
GFR calc Af Amer: >60 mL/min (ref >60)
GFR calc non Af Amer: >60 mL/min (ref >60)
Glucose, Bld: 112 mg/dL — ABNORMAL HIGH (ref 70–99)
Potassium: 3.3 mmol/L — ABNORMAL LOW (ref 3.5–5.1)
SODIUM: 136 mmol/L (ref 135–145)

## 2018-04-16 LAB — INFLUENZA PANEL BY PCR (TYPE A & B)
INFLBPCR: NEGATIVE
Influenza A By PCR: NEGATIVE

## 2018-04-16 LAB — D-DIMER, QUANTITATIVE: D-Dimer, Quant: 0.36 ug/mL-FEU (ref 0.00–0.50)

## 2018-04-16 MED ORDER — IPRATROPIUM-ALBUTEROL 0.5-2.5 (3) MG/3ML IN SOLN
3.0000 mL | Freq: Once | RESPIRATORY_TRACT | Status: AC
  Administered 2018-04-16: 3 mL via RESPIRATORY_TRACT
  Filled 2018-04-16: qty 3

## 2018-04-16 MED ORDER — ALBUTEROL SULFATE (2.5 MG/3ML) 0.083% IN NEBU: 2.5000 mg | INHALATION_SOLUTION | Freq: Once | RESPIRATORY_TRACT | Status: DC

## 2018-04-16 MED ORDER — PREDNISONE 50 MG PO TABS
60.0000 mg | ORAL_TABLET | Freq: Once | ORAL | Status: AC
  Administered 2018-04-16: 60 mg via ORAL
  Filled 2018-04-16: qty 1

## 2018-04-16 MED ORDER — ALBUTEROL (5 MG/ML) CONTINUOUS INHALATION SOLN
10.0000 mg/h | INHALATION_SOLUTION | Freq: Once | RESPIRATORY_TRACT | Status: AC
  Administered 2018-04-16: 10 mg/h via RESPIRATORY_TRACT
  Filled 2018-04-16: qty 20

## 2018-04-16 MED ORDER — ALBUTEROL SULFATE (2.5 MG/3ML) 0.083% IN NEBU
5.0000 mg | INHALATION_SOLUTION | Freq: Once | RESPIRATORY_TRACT | Status: AC
  Administered 2018-04-16: 5 mg via RESPIRATORY_TRACT
  Filled 2018-04-16: qty 6

## 2018-04-16 MED ORDER — ACETAMINOPHEN 325 MG PO TABS
650.0000 mg | ORAL_TABLET | Freq: Once | ORAL | Status: AC
  Administered 2018-04-16: 650 mg via ORAL
  Filled 2018-04-16: qty 2

## 2018-04-16 MED ORDER — ALBUTEROL SULFATE HFA 108 (90 BASE) MCG/ACT IN AERS
INHALATION_SPRAY | RESPIRATORY_TRACT | Status: AC
  Administered 2018-04-16: 2 via RESPIRATORY_TRACT
  Filled 2018-04-16: qty 6.7

## 2018-04-16 MED ORDER — ALBUTEROL SULFATE (2.5 MG/3ML) 0.083% IN NEBU: 2.5000 mg | INHALATION_SOLUTION | RESPIRATORY_TRACT | Status: DC

## 2018-04-16 MED ORDER — POTASSIUM CHLORIDE CRYS ER 20 MEQ PO TBCR
20.0000 meq | EXTENDED_RELEASE_TABLET | Freq: Once | ORAL | Status: AC
  Administered 2018-04-16: 20 meq via ORAL
  Filled 2018-04-16: qty 1

## 2018-04-16 MED ORDER — PREDNISONE 20 MG PO TABS: 40.0000 mg | ORAL_TABLET | Freq: Every day | ORAL | 0 refills | Status: DC

## 2018-04-16 MED ORDER — ALBUTEROL SULFATE (2.5 MG/3ML) 0.083% IN NEBU
2.5000 mg | INHALATION_SOLUTION | Freq: Once | RESPIRATORY_TRACT | Status: AC
  Administered 2018-04-16: 2.5 mg via RESPIRATORY_TRACT
  Filled 2018-04-16: qty 3

## 2018-04-16 MED ORDER — IPRATROPIUM BROMIDE 0.02 % IN SOLN
1.0000 mg | Freq: Once | RESPIRATORY_TRACT | Status: AC
  Administered 2018-04-16: 1 mg via RESPIRATORY_TRACT
  Filled 2018-04-16: qty 5

## 2018-04-16 MED ORDER — DULOXETINE HCL 60 MG PO CPEP: ORAL_CAPSULE | ORAL | 1 refills | Status: DC

## 2018-04-16 MED ORDER — ALBUTEROL SULFATE HFA 108 (90 BASE) MCG/ACT IN AERS
1.0000 | INHALATION_SPRAY | Freq: Once | RESPIRATORY_TRACT | Status: AC
  Administered 2018-04-16: 2 via RESPIRATORY_TRACT

## 2018-04-16 NOTE — Discharge Instructions (Signed)
Take the prescription as directed.  Use your albuterol inhaler (2 to 4 puffs) every 4 hours for the next 7 days, then as needed for cough, wheezing, or shortness of breath. Take over the counter tylenol and ibuprofen, as directed on packaging, as needed for discomfort.  Take over the counter decongestant (such as sudafed), as directed on packaging, for the next week.  Use over the counter normal saline nasal spray with frequent nose blowing, several times per day for the next 2 weeks. Call your regular medical doctor Monday to schedule a follow up appointment within the next 3 days.  Return to the Emergency Department immediately sooner if worsening.

## 2018-04-16 NOTE — ED Notes (Signed)
Ambulated down hall.   o2 dropped to 95%.  Hr raised from 85 to 106.   C/o worsening sob, dizziness and cp. Gait was unsteady.

## 2018-04-16 NOTE — ED Notes (Signed)
Pt ambulated 25 ft, O2 did not fall below 97% on room air, HR remained high 80s, stand by assist, complains of a headache that makes her dizzy

## 2018-04-16 NOTE — ED Triage Notes (Signed)
SOB, emesis. Chest pressure that started last night

## 2018-04-16 NOTE — ED Notes (Signed)
Resp called for breathing treatment.   

## 2018-04-16 NOTE — ED Notes (Signed)
Pt ambulated in hall with sats 96-98 on RA  Pulse 102-107  resp 20-24  Pt complained of dizziness while ambulating but made complete turns without any stagger to gait

## 2018-04-16 NOTE — ED Provider Notes (Signed)
[facility] EMERGENCY DEPARTMENT Provider Note   CSN: [id number] Arrival date & time: 04/16/18  1157    History   Chief Complaint Chief Complaint  Patient presents with  . Shortness of Breath    HPI [name] is a 53 y.o. female.     HPI  Pt was seen at 1310.  Per pt, c/o gradual onset and worsening of persistent cough, wheezing and SOB that began yesterday. Endorses hx of asthma, and has been using home MDI without relief.  Has been associated with decreased appetite and post-tussive emesis. Pt states she started to have generalized chest "pain" since 0300 this morning. Describes the CP as constant and "pressure."  Denies palpitations, no back pain, no abd pain, no N/V without coughing, no diarrhea, no fevers, no rash.     Past Medical History:  Diagnosis Date  . Allergy   . Anxiety   . Arthritis   . Chronic cough   . Diabetes mellitus without complication ([place])   . Folliculitis 05/27/4079  . Hidradenitis suppurativa   . Hypertension   . Migraine headache   . Pulmonary nodule   . Reactive airway disease 01/2018   per PFT's  . Sleep apnea     Patient Active Problem List   Diagnosis Date Noted  . Herpes labialis 07/20/2017  . Depression, major, single episode, moderate ([place]) 07/21/2016  . Chronic bilateral low back pain 12/02/2015  . Dysphagia 09/11/2015  . Gastroesophageal reflux disease without esophagitis 07/24/2015  . Colon cancer screening   . Encounter for screening colonoscopy 03/14/2015  . Insomnia 11/26/2014  . Varicose veins 11/26/2014  . Osteoarthritis of both knees 08/28/2014  . Sleep apnea 05/25/2014  . Depression 02/04/2014  . Skin abscess 12/01/2012  . Hidradenitis suppurativa 08/03/2012  . Diabetes ([place]) 07/20/2012  . Folliculitis 44/81/8563  . Migraine 06/04/2008  . Essential hypertension 06/04/2008  . CONSTIPATION, CHRONIC 06/04/2008  . Chronic arthralgias of knees and hips 06/04/2008  . ABDOMINAL PAIN 06/04/2008    Past  Surgical History:  Procedure Laterality Date  . ABDOMINAL HYSTERECTOMY    . COLONOSCOPY WITH PROPOFOL N/A 04/08/2015   Dr. Gala Romney: normal, repeat in 10 years   . ENDOMETRIAL ABLATION    . FOREIGN BODY REMOVAL N/A 05/26/2013   Procedure: FOREIGN BODY REMOVAL ADULT ABDOMINAL WALL;  Surgeon: Jamesetta So, MD;  Location: [facility];  Service: General;  Laterality: N/A;  . KNEE SURGERY Right 2012  . PILONIDAL CYST EXCISION  June 2011     OB History    Gravida  1   Para  1   Term      Preterm      AB      Living  1     SAB      TAB      Ectopic      Multiple      Live Births  1            Home Medications    Prior to Admission medications   Medication Sig Start Date End Date Taking? Authorizing Provider  albuterol (PROVENTIL HFA;VENTOLIN HFA) 108 (90 Base) MCG/ACT inhaler Inhale 2 puffs into the lungs every 4 (four) hours as needed. 12/28/17   Kathyrn Drown, MD  aspirin EC 81 MG tablet Take 81 mg by mouth daily.    [provider]  blood glucose meter kit and supplies Dispense based on patient and insurance preference. Use to test sugar once a day. For dx  E11.9 12/28/17   Kathyrn Drown, MD  cyclobenzaprine (FLEXERIL) 10 MG tablet Take 1 tablet (10 mg total) by mouth 3 (three) times daily as needed for muscle spasms. 06/17/15   Orpah Greek, MD  doxycycline (VIBRA-TABS) 100 MG tablet Take 1 tablet (100 mg total) by mouth 2 (two) times daily. Take with a snack and tall glass of water. 03/28/18   Cheyenne Adas, NP  DULoxetine (CYMBALTA) 60 MG capsule Take one po qd 04/16/18   Kathyrn Drown, MD  famotidine (PEPCID) 20 MG tablet Take 1 tablet (20 mg total) by mouth daily. 02/07/18   Kathyrn Drown, MD  fluticasone (FLONASE) 50 MCG/ACT nasal spray Place 2 sprays into both nostrils daily as needed for allergies or rhinitis. 12/11/16   Nilda Simmer, NP  fluticasone (FLOVENT HFA) 110 MCG/ACT inhaler Inhale 2 puffs into the lungs 2 (two) times daily.  01/28/18   Mikey Kirschner, MD  HYDROcodone-acetaminophen (NORCO/VICODIN) 5-325 MG tablet Take 1 tablet by mouth every 4 (four) hours as needed. 12/25/17   Nat Christen, MD  LINZESS 145 MCG CAPS capsule TAKE 1 CAPSULE BY MOUTH ONCE DAILY 01/19/18   Mahala Menghini, PA-C  loratadine (CLARITIN) 10 MG tablet Take 1 tablet (10 mg total) by mouth daily. 10/25/17   Kathyrn Drown, MD  LYRICA 75 MG capsule Take 75 mg by mouth daily.  11/27/16   [provider]  metoprolol tartrate (LOPRESSOR) 100 MG tablet Take 1 tablet (100 mg total) by mouth 2 (two) times daily. 07/20/17   Nilda Simmer, NP  ranitidine (ZANTAC) 300 MG tablet Take 1 tablet (300 mg total) by mouth at bedtime. Prn acid reflux 07/20/17   Nilda Simmer, NP  RELION PEN NEEDLES 31G X 6 MM MISC AS DIRECTED FOR  VICTOZA  PEN 12/21/17   Kathyrn Drown, MD  topiramate (TOPAMAX) 50 MG tablet Take one po at bedtime for migraines 07/20/17   Nilda Simmer, NP  traZODone (DESYREL) 100 MG tablet Take 1 tablet (100 mg total) by mouth at bedtime. 08/26/15   Kathyrn Drown, MD  triamterene-hydrochlorothiazide (MAXZIDE-25) 37.5-25 MG tablet Take 1 tablet by mouth daily. 12/17/17   Kathyrn Drown, MD  valACYclovir (VALTREX) 1000 MG tablet Take 2 po x 2 doses 12 hours apart prn fever blisters 07/20/17   Nilda Simmer, NP  VICTOZA 18 MG/3ML SOPN INJECT 0.3 MLS (1.8 MG TOTAL) INTO THE SKIN DAILY 04/13/18   Kathyrn Drown, MD    Family History Family History  Problem Relation Age of Onset  . Stroke Mother   . Hypertension Mother   . Thyroid disease Father   . Hypertension Sister   . Hypertension Brother   . Cancer Paternal Grandfather        prostate  . Colon cancer Maternal Grandfather     Social History Social History   Tobacco Use  . Smoking status: Never Smoker  . Smokeless tobacco: Never Used  Substance Use Topics  . Alcohol use: No    Alcohol/week: 0.0 standard drinks  . Drug use: No     Allergies   Bactrim  [sulfamethoxazole-trimethoprim]; Lisinopril; Other; and Metformin and related   Review of Systems Review of Systems ROS: Statement: All systems negative except as marked or noted in the HPI; Constitutional: Negative for fever and chills. ; ; Eyes: Negative for eye pain, redness and discharge. ; ; ENMT: Negative for ear pain, hoarseness, nasal congestion, sinus pressure and sore throat. ; ;  Cardiovascular: +CP. Negative for palpitations, diaphoresis, and peripheral edema. ; ; Respiratory: +cough, wheezing, SOB, post-tussive emesis. Negative for stridor. ; ; Gastrointestinal: Negative for nausea/vomiting without coughing, diarrhea, abdominal pain, blood in stool, hematemesis, jaundice and rectal bleeding. . ; ; Genitourinary: Negative for dysuria, flank pain and hematuria. ; ; Musculoskeletal: Negative for back pain and neck pain. Negative for swelling and trauma.; ; Skin: Negative for pruritus, rash, abrasions, blisters, bruising and skin lesion.; ; Neuro: Negative for headache, lightheadedness and neck stiffness. Negative for weakness, altered level of consciousness, altered mental status, extremity weakness, paresthesias, involuntary movement, seizure and syncope.       Physical Exam Updated Vital Signs BP 128/87 (BP Location: Right Arm)   Pulse 71   Temp 98.3 F (36.8 C) (Oral)   Resp 17   Ht 5' 6" (1.676 m)   Wt 98.9 kg   SpO2 100%   BMI 35.19 kg/m    Patient Vitals for the past 24 hrs:  BP Temp Temp src Pulse Resp SpO2 Height Weight  04/16/18 1740 - - - - - 100 % - -  04/16/18 1730 135/74 - - - (!) 25 - - -  04/16/18 1721 - - - - - 100 % - -  04/16/18 1646 124/65 - - - 13 - - -  04/16/18 1603 - - - - - 100 % - -  04/16/18 1500 124/73 - - - (!) 22 - - -  04/16/18 1449 - - - - - 99 % - -  04/16/18 1430 126/79 - - - 20 - - -  04/16/18 1342 128/87 - - 71 17 100 % - -  04/16/18 1222 134/86 98.3 F (36.8 C) Oral 76 18 100 % 5' 6" (1.676 m) 98.9 kg      Physical  Exam 1315: Physical examination:  Nursing notes reviewed; Vital signs and O2 SAT reviewed;  Constitutional: Well developed, Well nourished, Well hydrated, In no acute distress; Head:  Normocephalic, atraumatic; Eyes: EOMI, PERRL, No scleral icterus; ENMT: TM's clear bilat. +edemetous nasal turbinates bilat with clear rhinorrhea. Mouth and pharynx without lesions. No tonsillar exudates. No intra-oral edema. No submandibular or sublingual edema. No hoarse voice, no drooling, no stridor. No pain with manipulation of larynx. No trismus. Mouth and pharynx normal, Mucous membranes moist; Neck: Supple, Full range of motion, No lymphadenopathy; Cardiovascular: Regular rate and rhythm, No gallop; Respiratory: Breath sounds diminished & equal bilaterally, scattered wheezes. No audible wheezing. Speaking full sentences with ease, Normal respiratory effort/excursion; Chest: Nontender, Movement normal; Abdomen: Soft, Nontender, Nondistended, Normal bowel sounds; Genitourinary: No CVA tenderness; Extremities: Peripheral pulses normal, No tenderness, No edema, No calf edema or asymmetry.; Neuro: AA&Ox3, Major CN grossly intact.  Speech clear. No gross focal motor or sensory deficits in extremities.; Skin: Color normal, Warm, Dry.   ED Treatments / Results  Labs (all labs ordered are listed, but only abnormal results are displayed)   EKG EKG Interpretation  Date/Time:  Saturday April 16 2018 12:35:35 EST Ventricular Rate:  72 PR Interval:    QRS Duration: 83 QT Interval:  402 QTC Calculation: 440 R Axis:   60 Text Interpretation:  Sinus rhythm Borderline T wave abnormalities Baseline wander When compared with ECG of 12/25/2017 No significant change was found Confirmed by Francine Graven [phone]) on 04/16/2018 1:31:15 PM   Radiology   Procedures Procedures (including critical care time)  Medications Ordered in ED Medications  albuterol (PROVENTIL) (2.5 MG/3ML) 0.083% nebulizer solution 5 mg (5 mg  Nebulization  Given 04/16/18 1308)  predniSONE (DELTASONE) tablet 60 mg (60 mg Oral Given 04/16/18 1340)     Initial Impression / Assessment and Plan / ED Course  I have reviewed the triage vital signs and the nursing notes.  Pertinent labs & imaging results that were available during my care of the patient were reviewed by me and considered in my medical decision making (see chart for details).     MDM Reviewed: previous chart, nursing note and vitals Reviewed previous: labs and ECG Interpretation: labs, ECG and x-ray Total time providing critical care: 30-74 minutes. This excludes time spent performing separately reportable procedures and services.    CRITICAL CARE Performed by: Francine Graven Total critical care time: 35 minutes Critical care time was exclusive of separately billable procedures and treating other patients. Critical care was necessary to treat or prevent imminent or life-threatening deterioration. Critical care was time spent personally by me on the following activities: development of treatment plan with patient and/or surrogate as well as nursing, discussions with consultants, evaluation of patient's response to treatment, examination of patient, obtaining history from patient or surrogate, ordering and performing treatments and interventions, ordering and review of laboratory studies, ordering and review of radiographic studies, pulse oximetry and re-evaluation of patient's condition.   Results for orders placed or performed during the hospital encounter of 45/36/46  Basic metabolic panel  Result Value Ref Range   Sodium 136 135 - 145 mmol/L   Potassium 3.3 (L) 3.5 - 5.1 mmol/L   Chloride 99 98 - 111 mmol/L   CO2 28 22 - 32 mmol/L   Glucose, Bld 112 (H) 70 - 99 mg/dL   BUN 13 6 - 20 mg/dL   Creatinine, Ser 0.76 0.44 - 1.00 mg/dL   Calcium 9.6 8.9 - 10.3 mg/dL   GFR calc non Af Amer >60 >60 mL/min   GFR calc Af Amer >60 >60 mL/min   Anion gap 9 5 - 15   Troponin I - Once  Result Value Ref Range   Troponin I <0.03 <0.03 ng/mL  CBC with Differential  Result Value Ref Range   WBC 7.1 4.0 - 10.5 K/uL   RBC 4.88 3.87 - 5.11 MIL/uL   Hemoglobin 13.9 12.0 - 15.0 g/dL   HCT 43.2 36.0 - 46.0 %   MCV 88.5 80.0 - 100.0 fL   MCH 28.5 26.0 - 34.0 pg   MCHC 32.2 30.0 - 36.0 g/dL   RDW 13.2 11.5 - 15.5 %   Platelets 305 150 - 400 K/uL   nRBC 0.0 0.0 - 0.2 %   Neutrophils Relative % 68 %   Neutro Abs 4.8 1.7 - 7.7 K/uL   Lymphocytes Relative 15 %   Lymphs Abs 1.1 0.7 - 4.0 K/uL   Monocytes Relative 11 %   Monocytes Absolute 0.8 0.1 - 1.0 K/uL   Eosinophils Relative 6 %   Eosinophils Absolute 0.4 0.0 - 0.5 K/uL   Basophils Relative 0 %   Basophils Absolute 0.0 0.0 - 0.1 K/uL   Immature Granulocytes 0 %   Abs Immature Granulocytes 0.01 0.00 - 0.07 K/uL  Influenza panel by PCR (type A & B)  Result Value Ref Range   Influenza A By PCR NEGATIVE NEGATIVE   Influenza B By PCR NEGATIVE NEGATIVE  Troponin I - Once  Result Value Ref Range   Troponin I <0.03 <0.03 ng/mL  D-dimer, quantitative  Result Value Ref Range   D-Dimer, Quant 0.36 0.00 - 0.50 ug/mL-FEU   Dg  Chest 2 View Result Date: 04/16/2018 CLINICAL DATA:  Dyspnea EXAM: CHEST - 2 VIEW COMPARISON:  04/02/2017 chest radiograph. FINDINGS: Stable cardiomediastinal silhouette with normal heart size. No pneumothorax. No pleural effusion. Lungs appear clear, with no acute consolidative airspace disease and no pulmonary edema. IMPRESSION: No active cardiopulmonary disease. Electronically Signed   By: Ilona Sorrel M.D.   On: 04/16/2018 14:05    1500:  Pt ambulated after short neb: Sats 95% R/A but pt c/o chest tightness and SOB. Lungs are diminished, Sats 100% R/A sitting on stretcher. Will dose hour long neb. Will also check 2nd troponin and d-dimer.   1800:  Doubt PE as cause for symptoms with normal d-dimer and low risk [place].  Doubt ACS as cause for symptoms with normal troponin x2 and  unchanged EKG from previous after 12+ hours of constant atypical symptoms.  Hour long neb complete. Lungs CTA bilat, resps easy, Sats 99% R/A. Repeat labs reassuring. Pt ambulated again: Sats remain 97% R/A. Pt continues to complain of various somatic symptoms; again reassured she did not have "the flu" and of her reassuring testing results. No clear indication for admission at this time. Pt verb understanding and is ready to go home now. Continue to tx for asthma exacerbation as well as URI. Dx and testing d/w pt.  Questions answered.  Verb understanding, agreeable to d/c home with outpt f/u.    Final Clinical Impressions(s) / ED Diagnoses   Final diagnoses:  None    ED Discharge Orders    None       Francine Graven, DO 04/20/18 1546

## 2018-04-18 ENCOUNTER — Encounter: Payer: Self-pay | Admitting: Internal Medicine

## 2018-04-18 ENCOUNTER — Ambulatory Visit: Payer: Medicare HMO | Admitting: Gastroenterology

## 2018-04-18 ENCOUNTER — Telehealth: Payer: Self-pay | Admitting: Gastroenterology

## 2018-04-18 NOTE — Telephone Encounter (Signed)
PATIENT WAS A NO SHOW AND LETTER SENT  °

## 2018-04-26 ENCOUNTER — Other Ambulatory Visit: Payer: Self-pay

## 2018-04-26 ENCOUNTER — Ambulatory Visit: Payer: Medicare HMO | Admitting: Gastroenterology

## 2018-04-26 ENCOUNTER — Encounter: Payer: Self-pay | Admitting: Gastroenterology

## 2018-04-26 VITALS — BP 138/91 | HR 76 | Temp 96.5°F | Ht 66.0 in | Wt 216.2 lb

## 2018-04-26 DIAGNOSIS — K5909 Other constipation: Secondary | ICD-10-CM | POA: Diagnosis not present

## 2018-04-26 MED ORDER — PLECANATIDE 3 MG PO TABS: 3.0000 mg | ORAL_TABLET | Freq: Every day | ORAL | 3 refills | Status: DC

## 2018-04-26 NOTE — Patient Instructions (Signed)
1. Stop Linzess. Start Trulance 3mg  daily for constipation.  2. Try limiting foods from the HIGH FODMAP column to see if this helps with bloating and gas.  3. You can take Gas-X or Beano to help with gas prevention/treatment.  4. If these measures don't help, consider adding a probiotic for four weeks.  5. Call if you have persistent rectal bleeding, bloating, thin stools. At that time we may consider updating your colonoscopy.  6. Return to the office in six months.     Abdominal Bloating When you have abdominal bloating, your abdomen may feel full, tight, or painful. It may also look bigger than normal or swollen (distended). Common causes of abdominal bloating include:  Swallowing air.  Constipation.  Problems digesting food.  Eating too much.  Irritable bowel syndrome. This is a condition that affects the large intestine.  Lactose intolerance. This is an inability to digest lactose, a natural sugar in dairy products.  Celiac disease. This is a condition that affects the ability to digest gluten, a protein found in some grains.  Gastroparesis. This is a condition that slows down the movement of food in the stomach and small intestine. It is more common in people with diabetes mellitus.  Gastroesophageal reflux disease (GERD). This is a digestive condition that makes stomach acid flow back into the esophagus.  Urinary retention. This means that the body is holding onto urine, and the bladder cannot be emptied all the way. Follow these instructions at home: Eating and drinking  Avoid eating too much.  Try not to swallow air while talking or eating.  Avoid eating while lying down.  Avoid these foods and drinks: ? Foods that cause gas, such as broccoli, cabbage, cauliflower, and baked beans. ? Carbonated drinks. ? Hard candy. ? Chewing gum. Medicines  Take over-the-counter and prescription medicines only as told by your health care provider.  Take probiotic medicines.  These medicines contain live bacteria or yeasts that can help digestion.  Take coated peppermint oil capsules. Activity  Try to exercise regularly. Exercise may help to relieve bloating that is caused by gas and relieve constipation. General instructions  Keep all follow-up visits as told by your health care provider. This is important. Contact a health care provider if:  You have nausea and vomiting.  You have diarrhea.  You have abdominal pain.  You have unusual weight loss or weight gain.  You have severe pain, and medicines do not help. Get help right away if:  You have severe chest pain.  You have trouble breathing.  You have shortness of breath.  You have trouble urinating.  You have darker urine than normal.  You have blood in your stools or have dark, tarry stools. Summary  Abdominal bloating means that the abdomen is swollen.  Common causes of abdominal bloating are swallowing air, constipation, and problems digesting food.  Avoid eating too much and avoid swallowing air.  Avoid foods that cause gas, carbonated drinks, hard candy, and chewing gum. This information is not intended to replace advice given to you by your health care provider. Make sure you discuss any questions you have with your health care provider. Document Released: 02/28/2016 Document Revised: 02/28/2016 Document Reviewed: 02/28/2016 Elsevier Interactive Patient Education  2019 [employer]   Low-FODMAP Eating Plan  FODMAPs (fermentable oligosaccharides, disaccharides, monosaccharides, and polyols) are sugars that are hard for some people to digest. A low-FODMAP eating plan may help some people who have bowel (intestinal) diseases to manage their symptoms. This  meal plan can be complicated to follow. Work with a diet and nutrition specialist (dietitian) to make a low-FODMAP eating plan that is right for you. A dietitian can make sure that you get enough nutrition from this diet. What  are tips for following this plan? Reading food labels  Check labels for hidden FODMAPs such as: ? High-fructose syrup. ? Honey. ? Agave. ? Natural fruit flavors. ? Onion or garlic powder.  Choose low-FODMAP foods that contain 3-4 grams of fiber per serving.  Check food labels for serving sizes. Eat only one serving at a time to make sure FODMAP levels stay low. Meal planning  Follow a low-FODMAP eating plan for up to 6 weeks, or as told by your health care provider or dietitian.  To follow the eating plan: 1. Eliminate high-FODMAP foods from your diet completely. 2. Gradually reintroduce high-FODMAP foods into your diet one at a time. Most people should wait a few days after introducing one high-FODMAP food before they introduce the next high-FODMAP food. Your dietitian can recommend how quickly you may reintroduce foods. 3. Keep a daily record of what you eat and drink, and make note of any symptoms that you have after eating. 4. Review your daily record with a dietitian regularly. Your dietitian can help you identify which foods you can eat and which foods you should avoid. General tips  Drink enough fluid each day to keep your urine pale yellow.  Avoid processed foods. These often have added sugar and may be high in FODMAPs.  Avoid most dairy products, whole grains, and sweeteners.  Work with a dietitian to make sure you get enough fiber in your diet. Recommended foods Grains  Gluten-free grains, such as rice, oats, buckwheat, quinoa, corn, polenta, and millet. Gluten-free pasta, bread, or cereal. Rice noodles. Corn tortillas. Vegetables  Eggplant, zucchini, cucumber, peppers, green beans, Brussels sprouts, bean sprouts, lettuce, arugula, kale, Swiss chard, spinach, collard greens, bok choy, summer squash, potato, and tomato. Limited amounts of corn, carrot, and sweet potato. Green parts of scallions. Fruits  Bananas, oranges, lemons, limes, blueberries, raspberries,  strawberries, grapes, cantaloupe, honeydew melon, kiwi, papaya, passion fruit, and pineapple. Limited amounts of dried cranberries, banana chips, and shredded coconut. Dairy  Lactose-free milk, yogurt, and kefir. Lactose-free cottage cheese and ice cream. Non-dairy milks, such as almond, coconut, hemp, and rice milk. Yogurts made of non-dairy milks. Limited amounts of goat cheese, brie, mozzarella, parmesan, swiss, and other hard cheeses. Meats and other protein foods  Unseasoned beef, pork, poultry, or fish. Eggs. Tomasa Blase. Tofu (firm) and tempeh. Limited amounts of nuts and seeds, such as almonds, walnuts, [place] nuts, pecans, peanuts, pumpkin seeds, chia seeds, and sunflower seeds. Fats and oils  Butter-free spreads. Vegetable oils, such as olive, canola, and sunflower oil. Seasoning and other foods  Artificial sweeteners with names that do not end in "ol" such as aspartame, saccharine, and stevia. Maple syrup, white table sugar, raw sugar, brown sugar, and molasses. Fresh basil, coriander, parsley, rosemary, and thyme. Beverages  Water and mineral water. Sugar-sweetened soft drinks. Small amounts of orange juice or cranberry juice. Black and green tea. Most dry wines. Coffee. This may not be a complete list of low-FODMAP foods. Talk with your dietitian for more information. Foods to avoid Grains  Wheat, including kamut, durum, and semolina. Barley and bulgur. Couscous. Wheat-based cereals. Wheat noodles, bread, crackers, and pastries. Vegetables  Chicory root, artichoke, asparagus, cabbage, snow peas, sugar snap peas, mushrooms, and cauliflower. Onions, garlic, leeks, and the white  part of scallions. Fruits  Fresh, dried, and juiced forms of apple, pear, watermelon, peach, plum, cherries, apricots, blackberries, boysenberries, figs, nectarines, and mango. Avocado. Dairy  Milk, yogurt, ice cream, and soft cheese. Cream and sour cream. Milk-based sauces. Custard. Meats and other protein  foods  Fried or fatty meat. Sausage. Cashews and pistachios. Soybeans, baked beans, black beans, chickpeas, kidney beans, fava beans, navy beans, lentils, and split peas. Seasoning and other foods  Any sugar-free gum or candy. Foods that contain artificial sweeteners such as sorbitol, mannitol, isomalt, or xylitol. Foods that contain honey, high-fructose corn syrup, or agave. Bouillon, vegetable stock, beef stock, and chicken stock. Garlic and onion powder. Condiments made with onion, such as hummus, chutney, pickles, relish, salad dressing, and salsa. Tomato paste. Beverages  Chicory-based drinks. Coffee substitutes. Chamomile tea. Fennel tea. Sweet or fortified wines such as port or sherry. Diet soft drinks made with isomalt, mannitol, maltitol, sorbitol, or xylitol. Apple, pear, and mango juice. Juices with high-fructose corn syrup. This may not be a complete list of high-FODMAP foods. Talk with your dietitian to discuss what dietary choices are best for you.  Summary  A low-FODMAP eating plan is a short-term diet that eliminates FODMAPs from your diet to help ease symptoms of certain bowel diseases.  The eating plan usually lasts up to 6 weeks. After that, high-FODMAP foods are restarted gradually, one at a time, so you can find out which may be causing symptoms.  A low-FODMAP eating plan can be complicated. It is best to work with a dietitian who has experience with this type of plan. This information is not intended to replace advice given to you by your health care provider. Make sure you discuss any questions you have with your health care provider. Document Released: 09/22/2016 Document Revised: 09/22/2016 Document Reviewed: 09/22/2016 Elsevier Interactive Patient Education  [employer].

## 2018-04-26 NOTE — Progress Notes (Signed)
Primary Care Physician: Kathyrn Drown, MD  Primary Gastroenterologist:  Garfield Cornea, MD   Chief Complaint  Patient presents with  . Constipation    BM every 2-3 days. Takes linzess daily. Straining with BM, occas blood if straining hard. Past 3 weeks it has been pencil like yellow    HPI: [name] is a 53 y.o. female here for follow up. She was last seen in 09/2015. H/o constipation. Last colonoscopy in 2017, normal, with plans for 10 year follow up.   She reports BM about every 3-4 days. Takes Linzess every day. For the last three weeks, stools yellow in color and thin stools. Some brbpr if straining a lot with stools. No abdominal pain. No heartburn on medication. Really feels like she needs something to help clean her out now. Miralax previously did not help. She complains of abdominal bloating and gas.    Current Outpatient Medications  Medication Sig Dispense Refill  . albuterol (PROVENTIL HFA;VENTOLIN HFA) 108 (90 Base) MCG/ACT inhaler Inhale 2 puffs into the lungs every 4 (four) hours as needed. 1 Inhaler 5  . aspirin EC 81 MG tablet Take 81 mg by mouth daily.    . blood glucose meter kit and supplies Dispense based on patient and insurance preference. Use to test sugar once a day. For dx E11.9 1 each 11  . DULoxetine (CYMBALTA) 60 MG capsule Take one po qd (Patient taking differently: Take 100 mg by mouth daily. Take one po qd) 90 capsule 1  . famotidine (PEPCID) 20 MG tablet Take 1 tablet (20 mg total) by mouth daily. 90 tablet 1  . fluticasone (FLONASE) 50 MCG/ACT nasal spray Place 2 sprays into both nostrils daily as needed for allergies or rhinitis. 16 g 5  . fluticasone (FLOVENT HFA) 110 MCG/ACT inhaler Inhale 2 puffs into the lungs 2 (two) times daily. 1 Inhaler 5  . LINZESS 145 MCG CAPS capsule TAKE 1 CAPSULE BY MOUTH ONCE DAILY 90 capsule 0  . loratadine (CLARITIN) 10 MG tablet Take 1 tablet (10 mg total) by mouth daily. (Patient taking differently:  Take 10 mg by mouth as needed. ) 30 tablet 3  . metoprolol tartrate (LOPRESSOR) 100 MG tablet Take 1 tablet (100 mg total) by mouth 2 (two) times daily. (Patient taking differently: Take 100 mg by mouth daily. ) 180 tablet 1  . RELION PEN NEEDLES 31G X 6 MM MISC AS DIRECTED FOR  VICTOZA  PEN 100 each 4  . topiramate (TOPAMAX) 50 MG tablet Take one po at bedtime for migraines 90 tablet 1  . triamterene-hydrochlorothiazide (MAXZIDE-25) 37.5-25 MG tablet Take 1 tablet by mouth daily. 90 tablet 0  . valACYclovir (VALTREX) 1000 MG tablet Take 2 po x 2 doses 12 hours apart prn fever blisters 4 tablet 5  . VICTOZA 18 MG/3ML SOPN INJECT 0.3 MLS (1.8 MG TOTAL) INTO THE SKIN DAILY 27 mL 2   No current facility-administered medications for this visit.     Allergies as of 04/26/2018 - Review Complete 04/26/2018  Allergen Reaction Noted  . Bactrim [sulfamethoxazole-trimethoprim] Rash 03/16/2013  . Lisinopril  08/28/2014  . Other  08/03/2012  . Metformin and related  12/21/2017    ROS:  General: Negative for anorexia, weight loss, fever, chills, fatigue, weakness. ENT: Negative for hoarseness, difficulty swallowing , nasal congestion. CV: Negative for chest pain, angina, palpitations, dyspnea on exertion, peripheral edema.  Respiratory: Negative for dyspnea at rest, dyspnea on exertion, cough, sputum, wheezing.  GI: See history of present illness. GU:  Negative for dysuria, hematuria, urinary incontinence, urinary frequency, nocturnal urination.  Endo: Negative for unusual weight change.    Physical Examination:   BP (!) 138/91   Pulse 76   Temp (!) 96.5 F (35.8 C) (Oral)   Ht _0  (1.676 m)   Wt 216 lb 3.2 oz (98.1 kg)   BMI 34.90 kg/m   General: Well-nourished, well-developed in no acute distress.  Eyes: No icterus. Mouth: Oropharyngeal mucosa moist and pink , no lesions erythema or exudate. Lungs: Clear to auscultation bilaterally.  Heart: Regular rate and rhythm, no murmurs rubs  or gallops.  Abdomen: Bowel sounds are normal, nontender, nondistended, no hepatosplenomegaly or masses, no abdominal bruits or hernia , no rebound or guarding.   Extremities: No lower extremity edema. No clubbing or deformities. Neuro: Alert and oriented x 4   Skin: Warm and dry, no jaundice.   Psych: Alert and cooperative, normal mood and affect.  Labs:  Lab Results  Component Value Date   CREATININE 0.76 04/16/2018   BUN 13 04/16/2018   NA 136 04/16/2018   K 3.3 (L) 04/16/2018   CL 99 04/16/2018   CO2 28 04/16/2018   Lab Results  Component Value Date   WBC 7.1 04/16/2018   HGB 13.9 04/16/2018   HCT 43.2 04/16/2018   MCV 88.5 04/16/2018   PLT 305 04/16/2018   Lab Results  Component Value Date   ALT 18 12/25/2017   AST 21 12/25/2017   ALKPHOS 67 12/25/2017   BILITOT 0.5 12/25/2017    Imaging Studies: Dg Chest 2 View  Result Date: 04/16/2018 CLINICAL DATA:  Dyspnea EXAM: CHEST - 2 VIEW COMPARISON:  04/02/2017 chest radiograph. FINDINGS: Stable cardiomediastinal silhouette with normal heart size. No pneumothorax. No pleural effusion. Lungs appear clear, with no acute consolidative airspace disease and no pulmonary edema. IMPRESSION: No active cardiopulmonary disease. Electronically Signed   By: Ilona Sorrel M.D.   On: 04/16/2018 14:05

## 2018-04-27 NOTE — Assessment & Plan Note (Signed)
Inadequately controlled. Discussed possibly increasing Linzess to but she would like to try alternative therapy. Stop Linzess. Start Trulance 3mg  daily for constipation. Suspect bloating will improve with better management of constipation. Try limiting foods from the HIGH FODMAP column to see if this helps with bloating and gas. Try Gas-X or Beano to help with gas prevention/treatment. If these measures don't help, consider adding a probiotic for four weeks. She will call if persistent rectal bleeding, bloating, thin stools. At that time may consider updating your colonoscopy if persistent problems.  Return to the office in six months.

## 2018-04-28 ENCOUNTER — Telehealth: Payer: Self-pay

## 2018-04-28 NOTE — Telephone Encounter (Signed)
PA for Trulance was sent through [email or web address]. waiting on approval or denial.

## 2018-04-28 NOTE — Progress Notes (Signed)
cc'd to pcp 

## 2018-08-03 ENCOUNTER — Other Ambulatory Visit: Payer: Self-pay

## 2018-08-03 ENCOUNTER — Ambulatory Visit (INDEPENDENT_AMBULATORY_CARE_PROVIDER_SITE_OTHER): Payer: Medicare HMO | Admitting: Family Medicine

## 2018-08-03 DIAGNOSIS — M2550 Pain in unspecified joint: Secondary | ICD-10-CM

## 2018-08-03 DIAGNOSIS — G43019 Migraine without aura, intractable, without status migrainosus: Secondary | ICD-10-CM

## 2018-08-03 MED ORDER — TOPIRAMATE 50 MG PO TABS: ORAL_TABLET | ORAL | 1 refills | Status: DC

## 2018-08-03 MED ORDER — TRAMADOL HCL 50 MG PO TABS: ORAL_TABLET | ORAL | 0 refills | Status: DC

## 2018-08-03 NOTE — Progress Notes (Signed)
   Subjective:    Patient ID: [name], female    DOB: [birth year], 53 y.o.   MRN: [id number]  HPIheadache for about one and a half month. Pain on top of her head. Headache is every day. Tried aleve and  topamax 50mg  one qhs with no relief.  Patient describes as a throbbing headache some nausea with it but no vomiting the pain is been going on almost on a daily basis she denies blurred vision with a double vision with it denies fever chills sweats cough wheezing difficulty breathing  Joint pain for about 3 and a half weeks. Having pain in Right shoulder, right knee, thumbs and right foot. Tried aleve with no relief.  She is having a lot of joint pains and discomfort over the past few weeks denies any fevers denies any tick bites denies having this problem previous.  Virtual Visit via Video Note  I connected with [name] on 08/03/18 at  3:00 PM EDT by a video enabled telemedicine application and verified that I am speaking with the correct person using two identifiers.  Location: Patient: home Provider: office   I discussed the limitations of evaluation and management by telemedicine and the availability of in person appointments. The patient expressed understanding and agreed to proceed.  History of Present Illness:    Observations/Objective:   Assessment and Plan:   Follow Up Instructions:    I discussed the assessment and treatment plan with the patient. The patient was provided an opportunity to ask questions and all were answered. The patient agreed with the plan and demonstrated an understanding of the instructions.   The patient was advised to call back or seek an in-person evaluation if the symptoms worsen or if the condition fails to improve as anticipated.  I provided 15 minutes of non-face-to-face time during this encounter.       Review of Systems  Constitutional: Negative for activity change, appetite change and fatigue.  HENT: Negative for  congestion and rhinorrhea.   Respiratory: Negative for cough and shortness of breath.   Cardiovascular: Negative for chest pain and leg swelling.  Gastrointestinal: Negative for abdominal pain and diarrhea.  Endocrine: Negative for polydipsia and polyphagia.  Musculoskeletal: Positive for arthralgias and back pain.  Skin: Negative for color change.  Neurological: Positive for headaches. Negative for dizziness and weakness.  Psychiatric/Behavioral: Negative for behavioral problems and confusion.       Objective:   Physical Exam Patient had virtual visit Appears to be in no distress Atraumatic Neuro able to relate and oriented No apparent resp distress Color normal        Assessment & Plan:  It is hard to know what is causing her headaches currently she does have migraine history she also has high blood pressure she does not know her blood pressure currently  We will bring her into the office to do blood pressure check and also to run some lab work on her arthralgias  Increase Topamax 50 mg twice daily Tramadol for infrequent use patient was cautioned not to use it frequently because it can cause issues with serotonin syndrome given that she is on Cymbalta  If her headaches continue to get worse despite our efforts we will be need to get neurology involved

## 2018-08-05 ENCOUNTER — Encounter: Payer: Self-pay | Admitting: Family Medicine

## 2018-08-05 ENCOUNTER — Other Ambulatory Visit: Payer: Self-pay

## 2018-08-05 ENCOUNTER — Ambulatory Visit (INDEPENDENT_AMBULATORY_CARE_PROVIDER_SITE_OTHER): Payer: Medicare HMO | Admitting: Family Medicine

## 2018-08-05 VITALS — BP 126/84 | Temp 97.5°F | Ht 66.0 in | Wt 209.0 lb

## 2018-08-05 DIAGNOSIS — M170 Bilateral primary osteoarthritis of knee: Secondary | ICD-10-CM | POA: Diagnosis not present

## 2018-08-05 DIAGNOSIS — E119 Type 2 diabetes mellitus without complications: Secondary | ICD-10-CM | POA: Diagnosis not present

## 2018-08-05 DIAGNOSIS — M2550 Pain in unspecified joint: Secondary | ICD-10-CM | POA: Diagnosis not present

## 2018-08-05 DIAGNOSIS — R51 Headache: Secondary | ICD-10-CM

## 2018-08-05 DIAGNOSIS — R519 Headache, unspecified: Secondary | ICD-10-CM

## 2018-08-05 LAB — POCT GLYCOSYLATED HEMOGLOBIN (HGB A1C): Hemoglobin A1C: 5.6 % (ref 4.0–5.6)

## 2018-08-05 MED ORDER — DICLOFENAC SODIUM 75 MG PO TBEC: 75.0000 mg | DELAYED_RELEASE_TABLET | Freq: Two times a day (BID) | ORAL | 2 refills | Status: DC

## 2018-08-05 NOTE — Progress Notes (Signed)
   Subjective:    Patient ID: [name], female    DOB: 10/18/1965, 53 y.o.   MRN: [id number]  Had virtual visit yesterday and pt states she was told to come in today to get a1c and discuss headaches and joint pain.  This patient has a known history of diabetes as well as known history of headaches. Recently the patient has been having some significant troubles with headaches she states this migraine came on a few days ago it is difficult time letting go recently we bumped up the dose of topiramate  Today she relates ankle pain bilateral knee pain shoulder pain she states severe aching and stiffness all day long she denies any tick bites denies any fever chills sweats the joint discomforts of been going on for several months seemingly worse over the past few days Diabetes She presents for her follow-up diabetic visit. Pertinent negatives for hypoglycemia include no confusion or dizziness. Pertinent negatives for diabetes include no chest pain and no weakness.   Joint pain on top part on her foot, right knee, both thumbs and right shoulder.   Results for orders placed or performed in visit on 08/05/18  POCT glycosylated hemoglobin (Hb A1C)  Result Value Ref Range   Hemoglobin A1C 5.6 4.0 - 5.6 %   HbA1c POC (<> result, manual entry)     HbA1c, POC (prediabetic range)     HbA1c, POC (controlled diabetic range)       Review of Systems  Constitutional: Negative for activity change and appetite change.  HENT: Negative for congestion and rhinorrhea.   Respiratory: Negative for cough and shortness of breath.   Cardiovascular: Negative for chest pain and leg swelling.  Gastrointestinal: Negative for abdominal pain, nausea and vomiting.  Musculoskeletal: Positive for arthralgias and back pain.  Skin: Negative for color change.  Neurological: Negative for dizziness and weakness.  Psychiatric/Behavioral: Negative for agitation and confusion.       Objective:   Physical Exam  Vitals signs reviewed.  Constitutional:      General: She is not in acute distress. HENT:     Head: Normocephalic.  Cardiovascular:     Rate and Rhythm: Normal rate and regular rhythm.     Heart sounds: Normal heart sounds. No murmur.  Pulmonary:     Effort: Pulmonary effort is normal.     Breath sounds: Normal breath sounds.  Lymphadenopathy:     Cervical: No cervical adenopathy.  Neurological:     Mental Status: She is alert.  Psychiatric:        Behavior: Behavior normal.           Assessment & Plan:  Diabetes decent control continue current measures  Headache migraine condition hopefully Topamax twice daily will help use tramadol sparingly  Arthralgias lab work ordered to rule out possibility of arthritic conditions

## 2018-08-06 LAB — ANA: Anti Nuclear Antibody (ANA): NEGATIVE

## 2018-08-06 LAB — C-REACTIVE PROTEIN: CRP: 13 mg/L — ABNORMAL HIGH (ref 0–10)

## 2018-08-06 LAB — RHEUMATOID FACTOR: Rhuematoid fact SerPl-aCnc: <10 IU/mL (ref 0.0–13.9)

## 2018-08-06 LAB — SEDIMENTATION RATE: Sed Rate: 24 mm/hr (ref 0–40)

## 2018-08-08 NOTE — Addendum Note (Signed)
Addended by: Vicente Males on: 08/08/2018 10:53 AM   Modules accepted: Orders

## 2018-08-15 ENCOUNTER — Encounter: Payer: Self-pay | Admitting: Family Medicine

## 2018-08-22 ENCOUNTER — Encounter: Payer: Medicare HMO | Admitting: Family Medicine

## 2018-08-24 ENCOUNTER — Ambulatory Visit (INDEPENDENT_AMBULATORY_CARE_PROVIDER_SITE_OTHER): Payer: Medicare HMO | Admitting: Nurse Practitioner

## 2018-08-24 ENCOUNTER — Encounter: Payer: Self-pay | Admitting: Nurse Practitioner

## 2018-08-24 ENCOUNTER — Other Ambulatory Visit: Payer: Self-pay

## 2018-08-24 VITALS — BP 130/88 | Temp 97.4°F | Ht 65.5 in | Wt 208.8 lb

## 2018-08-24 DIAGNOSIS — B3731 Acute candidiasis of vulva and vagina: Secondary | ICD-10-CM

## 2018-08-24 DIAGNOSIS — Z0000 Encounter for general adult medical examination without abnormal findings: Secondary | ICD-10-CM

## 2018-08-24 DIAGNOSIS — B373 Candidiasis of vulva and vagina: Secondary | ICD-10-CM | POA: Diagnosis not present

## 2018-08-24 DIAGNOSIS — R5383 Other fatigue: Secondary | ICD-10-CM

## 2018-08-24 DIAGNOSIS — Z79899 Other long term (current) drug therapy: Secondary | ICD-10-CM

## 2018-08-24 DIAGNOSIS — B9689 Other specified bacterial agents as the cause of diseases classified elsewhere: Secondary | ICD-10-CM

## 2018-08-24 DIAGNOSIS — E785 Hyperlipidemia, unspecified: Secondary | ICD-10-CM | POA: Diagnosis not present

## 2018-08-24 DIAGNOSIS — L089 Local infection of the skin and subcutaneous tissue, unspecified: Secondary | ICD-10-CM

## 2018-08-24 MED ORDER — DOXYCYCLINE HYCLATE 100 MG PO TABS: 100.0000 mg | ORAL_TABLET | Freq: Two times a day (BID) | ORAL | 0 refills | Status: DC

## 2018-08-24 MED ORDER — MUPIROCIN CALCIUM 2 % EX CREA: 1.0000 "application " | TOPICAL_CREAM | Freq: Three times a day (TID) | CUTANEOUS | 0 refills | Status: DC

## 2018-08-24 MED ORDER — FLUCONAZOLE 150 MG PO TABS: ORAL_TABLET | ORAL | 0 refills | Status: DC

## 2018-08-24 NOTE — Progress Notes (Signed)
Subjective:    Patient ID: [name], female    DOB: 03-Jan-1966, 53 y.o.   MRN: [id number]  HPI The patient comes in today for a wellness visit.    A review of their health history was completed.  A review of medications was also completed.  Any needed refills; triamterene- hctz, cymbalta, relion pen needles for victoza.   Eating habits: health conscious  Falls/  MVA accidents in past few months: none  Regular exercise: a little walking and chair exercises  Specialist pt sees on regular basis: none  Preventative health issues were discussed.   Additional concerns: break out on face  Presents for her preventive health physical. Had a hysterectomy. Still has ovaries. No pelvic pain. Same sexual partner. Has lost some weight. Trying to stay active as possible with current limitations. Some depression lately. Relates to her grandson leaving for college. No suicidal or homicidal thoughts or ideation. Has a sore near the left nostril for several days. Regular vision and dental exams.   Review of Systems  Constitutional: Positive for fatigue. Negative for activity change, appetite change and fever.  HENT: Negative for dental problem, ear pain, sinus pressure and sore throat.   Respiratory: Negative for cough, chest tightness, shortness of breath and wheezing.   Cardiovascular: Negative for chest pain.  Gastrointestinal: Negative for abdominal distention, abdominal pain, blood in stool, constipation, diarrhea, nausea and vomiting.  Genitourinary: Negative for difficulty urinating, dysuria, enuresis, frequency, genital sores, pelvic pain, urgency and vaginal discharge.       Mild vaginal itching.   Skin:       Sore just outside left nostril. Tender.    Depression screen [facility] 2/9 08/24/2018 08/18/2017 07/21/2016 01/31/2014  Decreased Interest 0 0 2 2  Down, Depressed, Hopeless 0 2 2 2   PHQ - 2 Score 0 2 4 4   Altered sleeping 3 3 2 3   Tired, decreased energy 3 3 2 3   Change  in appetite 3 3 2 2   Feeling bad or failure about yourself  0 0 2 0  Trouble concentrating 2 1 2 3   Moving slowly or fidgety/restless 2 1 1  0  Suicidal thoughts 0 0 0 0  PHQ-9 Score 13 13 15 15   Difficult doing work/chores Somewhat difficult Somewhat difficult - -      Objective:   Physical Exam Constitutional:      General: She is not in acute distress.    Appearance: She is well-developed.  HENT:     Nose:     Comments: Nasal mucosa moderate erythema along the septum left side.  Neck:     Musculoskeletal: Normal range of motion and neck supple.     Thyroid: No thyromegaly.     Trachea: No tracheal deviation.  Cardiovascular:     Rate and Rhythm: Normal rate and regular rhythm.     Heart sounds: Normal heart sounds. No murmur. No gallop.   Pulmonary:     Effort: Pulmonary effort is normal.     Breath sounds: Normal breath sounds.  Chest:     Breasts:        Right: Normal. No mass, skin change or tenderness.        Left: Normal. No mass, skin change or tenderness.  Abdominal:     General: There is no distension.     Palpations: Abdomen is soft.     Tenderness: There is no abdominal tenderness.  Genitourinary:    General: Normal vulva.  Vagina: Vaginal discharge present.     Comments: External GU: no rashes or lesions. Vagina: pink with very small amount of white clumpy discharge. Cervix surgically absent. Bimanual exam: no tenderness or obvious masses. Exam limited due to abd girth.  Lymphadenopathy:     Cervical: No cervical adenopathy.     Upper Body:     Right upper body: No supraclavicular, axillary or pectoral adenopathy.     Left upper body: No supraclavicular, axillary or pectoral adenopathy.  Skin:    General: Skin is warm and dry.     Findings: Lesion present.     Comments: Small raised erythematous papular lesion with top excoriated just underneath the left nostril.   Neurological:     Mental Status: She is alert and oriented to person, place, and time.   Psychiatric:        Mood and Affect: Mood normal.        Behavior: Behavior normal.        Thought Content: Thought content normal.        Judgment: Judgment normal.           Assessment & Plan:   Problem List Items Addressed This Visit    None    Visit Diagnoses    Routine general medical examination at a health care facility    -  Primary   Relevant Orders   Comprehensive metabolic panel   Lipid panel   TSH   VITAMIN D 25 Hydroxy (Vit-D Deficiency, Fractures)   Localized bacterial skin infection       Relevant Medications   mupirocin cream (BACTROBAN) 2 %   fluconazole (DIFLUCAN) 150 MG tablet   Vaginal candidiasis       Relevant Medications   mupirocin cream (BACTROBAN) 2 %   fluconazole (DIFLUCAN) 150 MG tablet   Hyperlipidemia, unspecified hyperlipidemia type       Relevant Orders   Lipid panel   High risk medication use       Relevant Orders   Comprehensive metabolic panel   TSH   VITAMIN D 25 Hydroxy (Vit-D Deficiency, Fractures)   Other fatigue       Relevant Orders   TSH     Meds ordered this encounter  Medications  . doxycycline (VIBRA-TABS) 100 MG tablet    Sig: Take 1 tablet (100 mg total) by mouth 2 (two) times daily.    Dispense:  20 tablet    Refill:  0    Order Specific Question:   Supervising Provider    Answer:   Lilyan PuntLUKING, SCOTT A [[id number]]  . mupirocin cream (BACTROBAN) 2 %    Sig: Apply 1 application topically 3 (three) times daily. To infected area and Inside nose prn    Dispense:  15 g    Refill:  0    Order Specific Question:   Supervising Provider    Answer:   Lilyan PuntLUKING, SCOTT A [[id number]]  . fluconazole (DIFLUCAN) 150 MG tablet    Sig: One po qd prn yeast infection; may repeat in 3-4 days if needed    Dispense:  2 tablet    Refill:  0    Order Specific Question:   Supervising Provider    Answer:   Lilyan PuntLUKING, SCOTT A [[id number]]   Apply Bactroban to lesion as directed and start oral antibiotic. Call back if worsens or persists.  Continue weight  loss efforts and activity as tolerated.  According to patient she is currently on 100 mg of Cymbalta per specialist  for her neuropathic pain.  Recommend counseling; patient will think about this. Also consider psych referral if needed.  Return in about 3 months (around 11/24/2018).

## 2018-08-25 ENCOUNTER — Encounter: Payer: Self-pay | Admitting: Nurse Practitioner

## 2018-09-01 DIAGNOSIS — Z0000 Encounter for general adult medical examination without abnormal findings: Secondary | ICD-10-CM | POA: Diagnosis not present

## 2018-09-01 DIAGNOSIS — R5383 Other fatigue: Secondary | ICD-10-CM | POA: Diagnosis not present

## 2018-09-01 DIAGNOSIS — E785 Hyperlipidemia, unspecified: Secondary | ICD-10-CM | POA: Diagnosis not present

## 2018-09-01 DIAGNOSIS — Z79899 Other long term (current) drug therapy: Secondary | ICD-10-CM | POA: Diagnosis not present

## 2018-09-02 ENCOUNTER — Telehealth: Payer: Self-pay | Admitting: *Deleted

## 2018-09-02 LAB — LIPID PANEL
Chol/HDL Ratio: 4 ratio (ref 0.0–4.4)
Cholesterol, Total: 141 mg/dL (ref 100–199)
HDL: 35 mg/dL — ABNORMAL LOW (ref >39)
LDL Calculated: 86 mg/dL (ref 0–99)
Triglycerides: 98 mg/dL (ref 0–149)
VLDL Cholesterol Cal: 20 mg/dL (ref 5–40)

## 2018-09-02 LAB — COMPREHENSIVE METABOLIC PANEL WITH GFR
ALT: 11 IU/L (ref 0–32)
AST: 13 IU/L (ref 0–40)
Albumin/Globulin Ratio: 1.6 (ref 1.2–2.2)
Albumin: 4.6 g/dL (ref 3.8–4.9)
Alkaline Phosphatase: 69 IU/L (ref 39–117)
BUN/Creatinine Ratio: 21 (ref 9–23)
BUN: 14 mg/dL (ref 6–24)
Bilirubin Total: 0.4 mg/dL (ref 0.0–1.2)
CO2: 27 mmol/L (ref 20–29)
Calcium: 9.7 mg/dL (ref 8.7–10.2)
Chloride: 98 mmol/L (ref 96–106)
Creatinine, Ser: 0.68 mg/dL (ref 0.57–1.00)
GFR calc Af Amer: 115 mL/min/1.73
GFR calc non Af Amer: 99 mL/min/1.73
Globulin, Total: 2.9 g/dL (ref 1.5–4.5)
Glucose: 115 mg/dL — ABNORMAL HIGH (ref 65–99)
Potassium: 3.7 mmol/L (ref 3.5–5.2)
Sodium: 142 mmol/L (ref 134–144)
Total Protein: 7.5 g/dL (ref 6.0–8.5)

## 2018-09-02 LAB — TSH: TSH: 0.839 u[IU]/mL (ref 0.450–4.500)

## 2018-09-02 LAB — VITAMIN D 25 HYDROXY (VIT D DEFICIENCY, FRACTURES): Vit D, 25-Hydroxy: 28.5 ng/mL — ABNORMAL LOW (ref 30.0–100.0)

## 2018-09-02 NOTE — Telephone Encounter (Signed)
Fax from [employer]. Diabetic pt is not currently taking a statin. Please consider, if appropriate. Form on carolyn's desk. Wellness done with carolyn on 08/24/18 and lipid done on 09/01/18.

## 2018-09-05 NOTE — Telephone Encounter (Signed)
Addressed this issue with her recent labs. See note.

## 2018-09-06 NOTE — Telephone Encounter (Signed)
error 

## 2018-09-08 NOTE — Progress Notes (Signed)
Office Visit Note  Patient: [name]             Date of Birth: 04/05/1965           MRN: [id number]             PCP: Babs SciaraLuking, Scott A, MD Referring: Babs SciaraLuking, Scott A, MD Visit Date: 09/22/2018 Occupation: Disabled  Subjective:  Pain in multiple joints..   History of Present Illness: [name] is a 53 y.o. female seen in consultation per request of Dr. Gerda DissLuking.  According to patient she had right knee joint arthroscopic surgery for meniscal tear repair in 2012.  She has had knee joint discomfort for many years and was diagnosed with osteoarthritis in her knee joints.  She states 4 months ago she started having pain and discomfort in her bilateral feet.  Gradually the pain moved to her both knees, both hands and left trochanteric area.  She states the pain from the left trochanter radiates down to her left lower extremity.  She has noticed swelling in her hands and feet.  She has chronic discomfort in her neck and lower back.  None of the other joints are painful.  She denies any family history of autoimmune disease.  Patient denies any history of psoriasis or chronic diarrhea.  Activities of Daily Living:  Patient reports morning stiffness for several hours.   Patient Reports nocturnal pain.  Difficulty dressing/grooming: Reports Difficulty climbing stairs: Reports Difficulty getting out of chair: Reports Difficulty using hands for taps, buttons, cutlery, and/or writing: Reports  Review of Systems  Constitutional: Positive for fatigue. Negative for night sweats, weight gain and weight loss.  HENT: Negative for mouth sores, trouble swallowing, trouble swallowing, mouth dryness and nose dryness.   Eyes: Positive for dryness. Negative for pain, redness, itching and visual disturbance.  Respiratory: Negative for cough, shortness of breath, wheezing and difficulty breathing.   Cardiovascular: Negative for chest pain, palpitations, hypertension, irregular heartbeat and  swelling in legs/feet.  Gastrointestinal: Positive for constipation. Negative for abdominal pain, blood in stool and diarrhea.  Endocrine: Negative for increased urination.  Genitourinary: Negative for painful urination and vaginal dryness.  Musculoskeletal: Positive for arthralgias, joint pain, joint swelling, myalgias, morning stiffness and myalgias. Negative for muscle weakness and muscle tenderness.  Skin: Negative for color change, rash, hair loss, redness, skin tightness, ulcers and sensitivity to sunlight.  Allergic/Immunologic: Negative for susceptible to infections.  Neurological: Positive for headaches. Negative for dizziness, numbness, memory loss, night sweats and weakness.  Hematological: Negative for bruising/bleeding tendency and swollen glands.  Psychiatric/Behavioral: Positive for depressed mood and sleep disturbance. Negative for confusion. The patient is nervous/anxious.     PMFS History:  Patient Active Problem List   Diagnosis Date Noted   Herpes labialis 07/20/2017   Depression, major, single episode, moderate (HCC) 07/21/2016   Chronic bilateral low back pain 12/02/2015   Dysphagia 09/11/2015   Gastroesophageal reflux disease without esophagitis 07/24/2015   Colon cancer screening    Encounter for screening colonoscopy 03/14/2015   Insomnia 11/26/2014   Varicose veins 11/26/2014   Osteoarthritis of both knees 08/28/2014   Sleep apnea 05/25/2014   Depression 02/04/2014   Skin abscess 12/01/2012   Hidradenitis suppurativa 08/03/2012   Diabetes (HCC) 07/20/2012   Folliculitis 07/20/2012   Migraine 06/04/2008   Essential hypertension 06/04/2008   CONSTIPATION, CHRONIC 06/04/2008   Chronic arthralgias of knees and hips 06/04/2008   ABDOMINAL PAIN 06/04/2008    Past Medical History:  Diagnosis Date  Allergy    Anxiety    Arthritis    Chronic cough    Diabetes mellitus without complication (HCC)    Folliculitis 07/20/2012    Hidradenitis suppurativa    Hypertension    Migraine headache    Pulmonary nodule    Reactive airway disease 01/2018   per PFT's   Sleep apnea     Family History  Problem Relation Age of Onset   Stroke Mother    Hypertension Mother    Thyroid disease Father    Hypertension Sister    Hypertension Brother    Cancer Paternal Grandfather        prostate   Colon cancer Maternal Grandfather    Healthy Daughter    Past Surgical History:  Procedure Laterality Date   ABDOMINAL HYSTERECTOMY     COLONOSCOPY WITH PROPOFOL N/A 04/08/2015   Dr. Jena Gaussourk: normal, repeat in 10 years    ENDOMETRIAL ABLATION     FOREIGN BODY REMOVAL N/A 05/26/2013   Procedure: FOREIGN BODY REMOVAL ADULT ABDOMINAL WALL;  Surgeon: Dalia HeadingMark A Jenkins, MD;  Location: [facility];  Service: General;  Laterality: N/A;   KNEE SURGERY Right 2012   PILONIDAL CYST EXCISION  June 2011   Social History   Social History Narrative   Not on file   Immunization History  Administered Date(s) Administered   Influenza Split 12/01/2012   Influenza,inj,Quad PF,6+ Mos 11/08/2013, 12/19/2014, 12/02/2015, 12/21/2017   Influenza-Unspecified 11/10/2011   Pneumococcal Polysaccharide-23 11/10/2010     Objective: Vital Signs: BP (!) 149/99 (BP Location: Right Arm, Patient Position: Sitting, Cuff Size: Large)    Pulse 80    Resp 14    Ht 5\' 5"  (1.651 m)    Wt 211 lb 12.8 oz (96.1 kg)    BMI 35.25 kg/m    Physical Exam Vitals signs and nursing note reviewed.  Constitutional:      Appearance: She is well-developed.  HENT:     Head: Normocephalic and atraumatic.  Eyes:     Conjunctiva/sclera: Conjunctivae normal.  Neck:     Musculoskeletal: Normal range of motion.  Cardiovascular:     Rate and Rhythm: Normal rate and regular rhythm.     Heart sounds: Normal heart sounds.  Pulmonary:     Effort: Pulmonary effort is normal.     Breath sounds: Normal breath sounds.  Abdominal:     General: Bowel sounds are  normal.     Palpations: Abdomen is soft.  Lymphadenopathy:     Cervical: No cervical adenopathy.  Skin:    General: Skin is warm and dry.     Capillary Refill: Capillary refill takes less than 2 seconds.  Neurological:     Mental Status: She is alert and oriented to person, place, and time.  Psychiatric:        Behavior: Behavior normal.      Musculoskeletal Exam: C-spine thoracic and lumbar spine fairly good range of motion although she had lot of discomfort in the trapezius area and discomfort range of motion of her lumbar spine.  Shoulder joints and elbow joints with good range of motion.  She has tenderness on palpation of bilateral wrist joints PIPs and DIPs no synovitis was noted.  She had painful range of motion of her left hip joint.  She had tenderness over left trochanteric bursa.  She had pain with range of motion of bilateral knee joints without any warmth swelling or effusion.  She has limited extension of her right knee joint.  She  has tenderness over her ankle joints midfoot and over MTPs but no synovitis was noted.  She had several tender points and hyperalgesia.  CDAI Exam: CDAI Score: -- Patient Global: --; Provider Global: -- Swollen: --; Tender: -- Joint Exam   No joint exam has been documented for this visit   There is currently no information documented on the homunculus. Go to the Rheumatology activity and complete the homunculus joint exam.  Investigation: Findings:  08/05/18: ANA-, RF<10, CRP 13, sed rate 24 09/01/18: TSH 0.839, vitamin D 28.5  Component     Latest Ref Rng & Units 08/05/2018  Sed Rate     0 - 40 mm/hr 24  CRP     0 - 10 mg/L 13 (H)  RA Latex Turbid.     0.0 - 13.9 IU/mL <10.0  Anti Nuclear Antibody (ANA)     Negative Negative   Component     Latest Ref Rng & Units 09/01/2018  TSH     0.450 - 4.500 uIU/mL 0.839  Vitamin D, 25-Hydroxy     30.0 - 100.0 ng/mL 28.5 (L)   Imaging: Xr Hip Unilat W Or W/o Pelvis 2-3 Views Left  Result  Date: 09/22/2018 No hip joint narrowing or sclerosis was noted.  No chondrocalcinosis was noted. Impression: Unremarkable x-ray of the hip joint.  Xr Foot 2 Views Left  Result Date: 09/22/2018 First MTP, PIP and DIP narrowing was noted.  None of the other MTP joints showed any narrowing.  No erosive changes were noted.  No intertarsal joint space narrowing was noted.  No tibiotalar subtalar joint space narrowing was noted.  Inferior and posterior calcaneal spurs were noted. Impression: These findings are consistent with osteoarthritis of the foot.  Xr Foot 2 Views Right  Result Date: 09/22/2018 First MTP, PIP and DIP narrowing was noted.  None of the other MTP joints showed any narrowing.  No erosive changes were noted.  No intertarsal joint space narrowing was noted.  No tibiotalar subtalar joint space narrowing was noted.  Inferior and posterior calcaneal spurs were noted. Impression: These findings are consistent with osteoarthritis of the foot.  Xr Hand 2 View Left  Result Date: 09/22/2018 Severe CMC narrowing and spurring was noted.  PIP and DIP narrowing was noted.  No MCP intercarpal radiocarpal joint space narrowing was noted. Impression: These findings are consistent with osteoarthritis of the hand.  Xr Hand 2 View Right  Result Date: 09/22/2018 Severe CMC narrowing and spurring was noted.  PIP and DIP narrowing was noted.  No MCP, intercarpal or radiocarpal joint space narrowing was noted. Impression: These findings are consistent with osteoarthritis of the hand.  Xr Knee 3 View Left  Result Date: 09/22/2018 Moderate medial compartment narrowing was noted.  Medial and lateral osteophytes were noted.  No chondrocalcinosis was noted.  Severe patellofemoral narrowing was noted. Impression: These findings are consistent with moderate osteoarthritis and severe chondromalacia patella.  Xr Knee 3 View Right  Result Date: 09/22/2018 Severe medial compartment narrowing with medial,  intercondylar and lateral osteophytes was noted.  No chondrocalcinosis was noted.  Severe patellofemoral narrowing was noted. Impression: These findings are consistent with severe osteoarthritis and severe chondromalacia patella.  Xr Lumbar Spine 2-3 Views  Result Date: 09/22/2018 No significant disc space narrowing was noted.  Facet joint arthropathy was noted. Impression: These findings are consistent with facet joint arthropathy.   Recent Labs: Lab Results  Component Value Date   WBC 7.1 04/16/2018   HGB 13.9 04/16/2018  PLT 305 04/16/2018   NA 142 09/01/2018   K 3.7 09/01/2018   CL 98 09/01/2018   CO2 27 09/01/2018   GLUCOSE 115 (H) 09/01/2018   BUN 14 09/01/2018   CREATININE 0.68 09/01/2018   BILITOT 0.4 09/01/2018   ALKPHOS 69 09/01/2018   AST 13 09/01/2018   ALT 11 09/01/2018   PROT 7.5 09/01/2018   ALBUMIN 4.6 09/01/2018   CALCIUM 9.7 09/01/2018   GFRAA 115 09/01/2018    Speciality Comments: No specialty comments available.  Procedures:  Large Joint Inj: L greater trochanter on 09/22/2018 11:26 AM Indications: pain Details: 27 G 1.5 in needle, lateral approach  Arthrogram: No  Medications: 40 mg triamcinolone acetonide 40 MG/ML; 1.5 mL lidocaine 1 % Aspirate: 0 mL Outcome: tolerated well, no immediate complications Procedure, treatment alternatives, risks and benefits explained, specific risks discussed. Consent was given by the patient. Immediately prior to procedure a time out was called to verify the correct patient, procedure, equipment, support staff and site/side marked as required. Patient was prepped and draped in the usual sterile fashion.     Allergies: Bactrim [sulfamethoxazole-trimethoprim], Lisinopril, Other, and Metformin and related   Assessment / Plan:     Visit Diagnoses: Pain in both hands -patient complains of pain and discomfort in both hands.  She also gives history of intermittent swelling.  I do not see any synovitis.  The clinical and  radiographic findings are consistent with osteoarthritis.  Joint protection was discussed.  Plan: XR Hand 2 View Right, XR Hand 2 View Left,  Pain in left hip -she has painful range of motion of her left hip joint.  She also had tenderness over left trochanteric bursa.the x-ray of the left wrist joint was unremarkable.  Plan: XR HIP UNILAT W OR W/O PELVIS 2-3 VIEWS LEFT,   Trochanteric bursitis, left hip -a handout on IT band exercises were given.  Different treatment options were discussed.  Per patient's request left trochanteric bursa was injected with cortisone as described above.  She tolerated the procedure well.  I have advised her to monitor blood sugar and blood pressure closely.  Chronic pain of both knees -she has pain and discomfort in her bilateral knee joints.  She has severe osteoarthritis in her right knee and mild osteoarthritis in her left knee joint.  She has severe chondromalacia patella in both knees.  Handout on knee joint exercises was given.  She will need right total knee replacement when she is ready.  Plan: XR KNEE 3 VIEW RIGHT, XR KNEE 3 VIEW LEFT,  Primary osteoarthritis of both knees  Pain in both feet -she complains of discomfort in bilateral feet and swelling.  No synovitis was noted.  The x-ray of bilateral feet and the clinical findings are consistent with osteoarthritis.  Proper fitting shoes were discussed.  I will obtain following labs today.  Plan: XR Foot 2 Views Right, XR Foot 2 Views Left, Cyclic citrul peptide antibody, IgG, 14-3-3 eta Protein, Uric acid, we will contact her with the lab results when available.  Chronic midline low back pain without sciatica - Plan: XR Lumbar Spine 2-3 Views, patient complains of a lot of pain and discomfort in her lower back.  The x-rays were consistent with facet joint arthropathy.  A handout on back exercises was given.  Myofascial pain -patient had multiple tender points and generalized hyperalgesia.  There is also family  history of fibromyalgia in her sister.  It raises concern about myofascial pain syndrome or fibromyalgia.  Plan: CK, if she has ongoing pain may be she will benefit from referral to pain clinic.  Family history of fibromyalgia -in her sister  Other medical problems are listed as follows:  Hidradenitis suppurativa   Gastroesophageal reflux disease without esophagitis  Essential hypertension -her blood pressure is elevated.  History of diabetes mellitus, type II   History of sleep apnea   History of depression   Other insomnia   Hx of migraines   Orders: Orders Placed This Encounter  Procedures   Large Joint Inj   XR Hand 2 View Right   XR Hand 2 View Left   XR KNEE 3 VIEW RIGHT   XR KNEE 3 VIEW LEFT   XR Foot 2 Views Right   XR Foot 2 Views Left   XR HIP UNILAT W OR W/O PELVIS 2-3 VIEWS LEFT   XR Lumbar Spine 2-3 Views   CK   Cyclic citrul peptide antibody, IgG   14-3-3 eta Protein   Uric acid   No orders of the defined types were placed in this encounter.   Face-to-face time spent with patient was 60 minutes. Greater than 50% of time was spent in counseling and coordination of care.  Follow-Up Instructions: Return if symptoms worsen or fail to improve, for Osteoarthritis, myofascial pain.   Bo Merino, MD  Note - This record has been created using Editor, commissioning.  Chart creation errors have been sought, but may not always  have been located. Such creation errors do not reflect on  the standard of medical care.

## 2018-09-22 ENCOUNTER — Ambulatory Visit (INDEPENDENT_AMBULATORY_CARE_PROVIDER_SITE_OTHER): Payer: Medicare HMO | Admitting: Rheumatology

## 2018-09-22 ENCOUNTER — Ambulatory Visit: Payer: Self-pay

## 2018-09-22 ENCOUNTER — Other Ambulatory Visit: Payer: Self-pay

## 2018-09-22 ENCOUNTER — Encounter: Payer: Self-pay | Admitting: Rheumatology

## 2018-09-22 ENCOUNTER — Telehealth: Payer: Self-pay

## 2018-09-22 VITALS — BP 149/99 | HR 80 | Resp 14 | Ht 65.0 in | Wt 211.8 lb

## 2018-09-22 DIAGNOSIS — M79641 Pain in right hand: Secondary | ICD-10-CM | POA: Diagnosis not present

## 2018-09-22 DIAGNOSIS — M79672 Pain in left foot: Secondary | ICD-10-CM

## 2018-09-22 DIAGNOSIS — M545 Low back pain, unspecified: Secondary | ICD-10-CM

## 2018-09-22 DIAGNOSIS — L732 Hidradenitis suppurativa: Secondary | ICD-10-CM

## 2018-09-22 DIAGNOSIS — G8929 Other chronic pain: Secondary | ICD-10-CM

## 2018-09-22 DIAGNOSIS — M25562 Pain in left knee: Secondary | ICD-10-CM | POA: Diagnosis not present

## 2018-09-22 DIAGNOSIS — M25552 Pain in left hip: Secondary | ICD-10-CM

## 2018-09-22 DIAGNOSIS — M7918 Myalgia, other site: Secondary | ICD-10-CM | POA: Diagnosis not present

## 2018-09-22 DIAGNOSIS — Z8269 Family history of other diseases of the musculoskeletal system and connective tissue: Secondary | ICD-10-CM

## 2018-09-22 DIAGNOSIS — M79642 Pain in left hand: Secondary | ICD-10-CM

## 2018-09-22 DIAGNOSIS — M79671 Pain in right foot: Secondary | ICD-10-CM

## 2018-09-22 DIAGNOSIS — M170 Bilateral primary osteoarthritis of knee: Secondary | ICD-10-CM

## 2018-09-22 DIAGNOSIS — Z8669 Personal history of other diseases of the nervous system and sense organs: Secondary | ICD-10-CM

## 2018-09-22 DIAGNOSIS — Z8639 Personal history of other endocrine, nutritional and metabolic disease: Secondary | ICD-10-CM

## 2018-09-22 DIAGNOSIS — K219 Gastro-esophageal reflux disease without esophagitis: Secondary | ICD-10-CM

## 2018-09-22 DIAGNOSIS — G4709 Other insomnia: Secondary | ICD-10-CM

## 2018-09-22 DIAGNOSIS — M25561 Pain in right knee: Secondary | ICD-10-CM | POA: Diagnosis not present

## 2018-09-22 DIAGNOSIS — I10 Essential (primary) hypertension: Secondary | ICD-10-CM

## 2018-09-22 DIAGNOSIS — M7062 Trochanteric bursitis, left hip: Secondary | ICD-10-CM

## 2018-09-22 DIAGNOSIS — Z8659 Personal history of other mental and behavioral disorders: Secondary | ICD-10-CM

## 2018-09-22 MED ORDER — TRIAMCINOLONE ACETONIDE 40 MG/ML IJ SUSP
40.0000 mg | INTRAMUSCULAR | Status: AC | PRN
  Administered 2018-09-22: 40 mg via INTRA_ARTICULAR

## 2018-09-22 MED ORDER — LIDOCAINE HCL 1 % IJ SOLN
1.5000 mL | INTRAMUSCULAR | Status: AC | PRN
  Administered 2018-09-22: 1.5 mL

## 2018-09-22 NOTE — Telephone Encounter (Signed)
Per Dr. Estanislado Pandy, if new patient labs are normal, patient can be called with results and follow up prn.

## 2018-09-22 NOTE — Patient Instructions (Addendum)
Journal for Nurse Practitioners, 15(4), 263-267. Retrieved November 15, 2017 from [email or web address].[email or web address]">  °Knee Exercises °Ask your health care provider which exercises are safe for you. Do exercises exactly as told by your health care provider and adjust them as directed. It is normal to feel mild stretching, pulling, tightness, or discomfort as you do these exercises. Stop right away if you feel sudden pain or your pain gets worse. Do not begin these exercises until told by your health care provider. °Stretching and range-of-motion exercises °These exercises warm up your muscles and joints and improve the movement and flexibility of your knee. These exercises also help to relieve pain and swelling. °Knee extension, prone °1. Lie on your abdomen (prone position) on a bed. °2. Place your left / right knee just beyond the edge of the surface so your knee is not on the bed. You can put a towel under your left / right thigh just above your kneecap for comfort. °3. Relax your leg muscles and allow gravity to straighten your knee (extension). You should feel a stretch behind your left / right knee. °4. Hold this position for __________ seconds. °5. Scoot up so your knee is supported between repetitions. °Repeat __________ times. Complete this exercise __________ times a day. °Knee flexion, active ° °1. Lie on your back with both legs straight. If this causes back discomfort, bend your left / right knee so your foot is flat on the floor. °2. Slowly slide your left / right heel back toward your buttocks. Stop when you feel a gentle stretch in the front of your knee or thigh (flexion). °3. Hold this position for __________ seconds. °4. Slowly slide your left / right heel back to the starting position. °Repeat __________ times. Complete this exercise __________ times a day. °Quadriceps stretch, prone ° °1. Lie on your abdomen on a firm surface, such as a bed or padded floor. °2. Bend your left / right knee and hold  your ankle. If you cannot reach your ankle or pant leg, loop a belt around your foot and grab the belt instead. °3. Gently pull your heel toward your buttocks. Your knee should not slide out to the side. You should feel a stretch in the front of your thigh and knee (quadriceps). °4. Hold this position for __________ seconds. °Repeat __________ times. Complete this exercise __________ times a day. °Hamstring, supine °1. Lie on your back (supine position). °2. Loop a belt or towel over the ball of your left / right foot. The ball of your foot is on the walking surface, right under your toes. °3. Straighten your left / right knee and slowly pull on the belt to raise your leg until you feel a gentle stretch behind your knee (hamstring). °? Do not let your knee bend while you do this. °? Keep your other leg flat on the floor. °4. Hold this position for __________ seconds. °Repeat __________ times. Complete this exercise __________ times a day. °Strengthening exercises °These exercises build strength and endurance in your knee. Endurance is the ability to use your muscles for a long time, even after they get tired. °Quadriceps, isometric °This exercise stretches the muscles in front of your thigh (quadriceps) without moving your knee joint (isometric). °1. Lie on your back with your left / right leg extended and your other knee bent. Put a rolled towel or small pillow under your knee if told by your health care provider. °2. Slowly tense the muscles in the front of your left /   right thigh. You should see your kneecap slide up toward your hip or see increased dimpling just above the knee. This motion will push the back of the knee toward the floor. °3. For __________ seconds, hold the muscle as tight as you can without increasing your pain. °4. Relax the muscles slowly and completely. °Repeat __________ times. Complete this exercise __________ times a day. °Straight leg raises °This exercise stretches the muscles in front  of your thigh (quadriceps) and the muscles that move your hips (hip flexors). °1. Lie on your back with your left / right leg extended and your other knee bent. °2. Tense the muscles in the front of your left / right thigh. You should see your kneecap slide up or see increased dimpling just above the knee. Your thigh may even shake a bit. °3. Keep these muscles tight as you raise your leg 4-6 inches (10-15 cm) off the floor. Do not let your knee bend. °4. Hold this position for __________ seconds. °5. Keep these muscles tense as you lower your leg. °6. Relax your muscles slowly and completely after each repetition. °Repeat __________ times. Complete this exercise __________ times a day. °Hamstring, isometric °1. Lie on your back on a firm surface. °2. Bend your left / right knee about __________ degrees. °3. Dig your left / right heel into the surface as if you are trying to pull it toward your buttocks. Tighten the muscles in the back of your thighs (hamstring) to "dig" as hard as you can without increasing any pain. °4. Hold this position for __________ seconds. °5. Release the tension gradually and allow your muscles to relax completely for __________ seconds after each repetition. °Repeat __________ times. Complete this exercise __________ times a day. °Hamstring curls °If told by your health care provider, do this exercise while wearing ankle weights. Begin with __________ lb weights. Then increase the weight by 1 lb (0.5 kg) increments. Do not wear ankle weights that are more than __________ lb. °1. Lie on your abdomen with your legs straight. °2. Bend your left / right knee as far as you can without feeling pain. Keep your hips flat against the floor. °3. Hold this position for __________ seconds. °4. Slowly lower your leg to the starting position. °Repeat __________ times. Complete this exercise __________ times a day. °Squats °This exercise strengthens the muscles in front of your thigh and knee  (quadriceps). °1. Stand in front of a table, with your feet and knees pointing straight ahead. You may rest your hands on the table for balance but not for support. °2. Slowly bend your knees and lower your hips like you are going to sit in a chair. °? Keep your weight over your heels, not over your toes. °? Keep your lower legs upright so they are parallel with the table legs. °? Do not let your hips go lower than your knees. °? Do not bend lower than told by your health care provider. °? If your knee pain increases, do not bend as low. °3. Hold the squat position for __________ seconds. °4. Slowly push with your legs to return to standing. Do not use your hands to pull yourself to standing. °Repeat __________ times. Complete this exercise __________ times a day. °Wall slides °This exercise strengthens the muscles in front of your thigh and knee (quadriceps). °1. Lean your back against a smooth wall or door, and walk your feet out 18-24 inches (46-61 cm) from it. °2. Place your feet hip-width apart. °3.   Slowly slide down the wall or door until your knees bend __________ degrees. Keep your knees over your heels, not over your toes. Keep your knees in line with your hips. 4. Hold this position for __________ seconds. Repeat __________ times. Complete this exercise __________ times a day. Straight leg raises This exercise strengthens the muscles that rotate the leg at the hip and move it away from your body (hip abductors). 1. Lie on your side with your left / right leg in the top position. Lie so your head, shoulder, knee, and hip line up. You may bend your bottom knee to help you keep your balance. 2. Roll your hips slightly forward so your hips are stacked directly over each other and your left / right knee is facing forward. 3. Leading with your heel, lift your top leg 4-6 inches (10-15 cm). You should feel the muscles in your outer hip lifting. ? Do not let your foot drift forward. ? Do not let your knee  roll toward the ceiling. 4. Hold this position for __________ seconds. 5. Slowly return your leg to the starting position. 6. Let your muscles relax completely after each repetition. Repeat __________ times. Complete this exercise __________ times a day. Straight leg raises This exercise stretches the muscles that move your hips away from the front of the pelvis (hip extensors). 1. Lie on your abdomen on a firm surface. You can put a pillow under your hips if that is more comfortable. 2. Tense the muscles in your buttocks and lift your left / right leg about 4-6 inches (10-15 cm). Keep your knee straight as you lift your leg. 3. Hold this position for __________ seconds. 4. Slowly lower your leg to the starting position. 5. Let your leg relax completely after each repetition. Repeat __________ times. Complete this exercise __________ times a day. This information is not intended to replace advice given to you by your health care provider. Make sure you discuss any questions you have with your health care provider. Document Released: 12/10/2004 Document Revised: 11/16/2017 Document Reviewed: 11/16/2017 Elsevier Patient Education  2020 [employer] Back Exercises The following exercises strengthen the muscles that help to support the trunk and back. They also help to keep the lower back flexible. Doing these exercises can help to prevent back pain or lessen existing pain.  If you have back pain or discomfort, try doing these exercises 2-3 times each day or as told by your health care provider.  As your pain improves, do them once each day, but increase the number of times that you repeat the steps for each exercise (do more repetitions).  To prevent the recurrence of back pain, continue to do these exercises once each day or as told by your health care provider. Do exercises exactly as told by your health care provider and adjust them as directed. It is normal to feel mild stretching,  pulling, tightness, or discomfort as you do these exercises, but you should stop right away if you feel sudden pain or your pain gets worse. Exercises Single knee to chest Repeat these steps 3-5 times for each leg: 1. Lie on your back on a firm bed or the floor with your legs extended. 2. Bring one knee to your chest. Your other leg should stay extended and in contact with the floor. 3. Hold your knee in place by grabbing your knee or thigh with both hands and hold. 4. Pull on your knee until you feel a gentle stretch in your  lower back or buttocks. 5. Hold the stretch for 10-30 seconds. 6. Slowly release and straighten your leg. Pelvic tilt Repeat these steps 5-10 times: 1. Lie on your back on a firm bed or the floor with your legs extended. 2. Bend your knees so they are pointing toward the ceiling and your feet are flat on the floor. 3. Tighten your lower abdominal muscles to press your lower back against the floor. This motion will tilt your pelvis so your tailbone points up toward the ceiling instead of pointing to your feet or the floor. 4. With gentle tension and even breathing, hold this position for 5-10 seconds. Cat-cow Repeat these steps until your lower back becomes more flexible: 1. Get into a hands-and-knees position on a firm surface. Keep your hands under your shoulders, and keep your knees under your hips. You may place padding under your knees for comfort. 2. Let your head hang down toward your chest. Contract your abdominal muscles and point your tailbone toward the floor so your lower back becomes rounded like the back of a cat. 3. Hold this position for 5 seconds. 4. Slowly lift your head, let your abdominal muscles relax and point your tailbone up toward the ceiling so your back forms a sagging arch like the back of a cow. 5. Hold this position for 5 seconds.  Press-ups Repeat these steps 5-10 times: 1. Lie on your abdomen (face-down) on the floor. 2. Place your palms  near your head, about shoulder-width apart. 3. Keeping your back as relaxed as possible and keeping your hips on the floor, slowly straighten your arms to raise the top half of your body and lift your shoulders. Do not use your back muscles to raise your upper torso. You may adjust the placement of your hands to make yourself more comfortable. 4. Hold this position for 5 seconds while you keep your back relaxed. 5. Slowly return to lying flat on the floor.  Bridges Repeat these steps 10 times: 1. Lie on your back on a firm surface. 2. Bend your knees so they are pointing toward the ceiling and your feet are flat on the floor. Your arms should be flat at your sides, next to your body. 3. Tighten your buttocks muscles and lift your buttocks off the floor until your waist is at almost the same height as your knees. You should feel the muscles working in your buttocks and the back of your thighs. If you do not feel these muscles, slide your feet 1-2 inches farther away from your buttocks. 4. Hold this position for 3-5 seconds. 5. Slowly lower your hips to the starting position, and allow your buttocks muscles to relax completely. If this exercise is too easy, try doing it with your arms crossed over your chest. Abdominal crunches Repeat these steps 5-10 times: 1. Lie on your back on a firm bed or the floor with your legs extended. 2. Bend your knees so they are pointing toward the ceiling and your feet are flat on the floor. 3. Cross your arms over your chest. 4. Tip your chin slightly toward your chest without bending your neck. 5. Tighten your abdominal muscles and slowly raise your trunk (torso) high enough to lift your shoulder blades a tiny bit off the floor. Avoid raising your torso higher than that because it can put too much stress on your low back and does not help to strengthen your abdominal muscles. 6. Slowly return to your starting position. Back lifts Repeat these  steps 5-10  times: 1. Lie on your abdomen (face-down) with your arms at your sides, and rest your forehead on the floor. 2. Tighten the muscles in your legs and your buttocks. 3. Slowly lift your chest off the floor while you keep your hips pressed to the floor. Keep the back of your head in line with the curve in your back. Your eyes should be looking at the floor. 4. Hold this position for 3-5 seconds. 5. Slowly return to your starting position. Contact a health care provider if:  Your back pain or discomfort gets much worse when you do an exercise.  Your worsening back pain or discomfort does not lessen within 2 hours after you exercise. If you have any of these problems, stop doing these exercises right away. Do not do them again unless your health care provider says that you can. Get help right away if:  You develop sudden, severe back pain. If this happens, stop doing the exercises right away. Do not do them again unless your health care provider says that you can. This information is not intended to replace advice given to you by your health care provider. Make sure you discuss any questions you have with your health care provider. Document Released: 03/05/2004 Document Revised: 06/02/2018 Document Reviewed: 10/28/2017 Elsevier Patient Education  [facility] Ask your health care provider which exercises are safe for you. Do exercises exactly as told by your health care provider and adjust them as directed. It is normal to feel mild stretching, pulling, tightness, or discomfort as you do these exercises. Stop right away if you feel sudden pain or your pain gets significantly worse. Do not begin these exercises until told by your health care provider. Stretching and range-of-motion exercises These exercises warm up your muscles and joints and improve the movement and flexibility of your hip and pelvis. Quadriceps stretch, prone  1. Lie on your abdomen on a firm  surface, such as a bed or padded floor (prone position). 2. Bend your left / right knee and reach back to hold your ankle or pant leg. If you cannot reach your ankle or pant leg, loop a belt around your foot and grab the belt instead. 3. Gently pull your heel toward your buttocks. Your knee should not slide out to the side. You should feel a stretch in the front of your thigh and knee (quadriceps). 4. Hold this position for __________ seconds. Repeat __________ times. Complete this exercise __________ times a day. Iliotibial band stretch An iliotibial band is a strong band of muscle tissue that runs from the outer side of your hip to the outer side of your thigh and knee. 1. Lie on your side with your left / right leg in the top position. 2. Bend both of your knees and grab your left / right ankle. Stretch out your bottom arm to help you balance. 3. Slowly bring your top knee back so your thigh goes behind your trunk. 4. Slowly lower your top leg toward the floor until you feel a gentle stretch on the outside of your left / right hip and thigh. If you do not feel a stretch and your knee will not fall farther, place the heel of your other foot on top of your knee and pull your knee down toward the floor with your foot. 5. Hold this position for __________ seconds. Repeat __________ times. Complete this exercise __________ times a day. Strengthening exercises These exercises build strength  and endurance in your hip and pelvis. Endurance is the ability to use your muscles for a long time, even after they get tired. Straight leg raises, side-lying This exercise strengthens the muscles that rotate the leg at the hip and move it away from your body (hip abductors). 1. Lie on your side with your left / right leg in the top position. Lie so your head, shoulder, hip, and knee line up. You may bend your bottom knee to help you balance. 2. Roll your hips slightly forward so your hips are stacked directly over  each other and your left / right knee is facing forward. 3. Tense the muscles in your outer thigh and lift your top leg 4-6 inches (10-15 cm). 4. Hold this position for __________ seconds. 5. Slowly return to the starting position. Let your muscles relax completely before doing another repetition. Repeat __________ times. Complete this exercise __________ times a day. Leg raises, prone This exercise strengthens the muscles that move the hips (hip extensors). 1. Lie on your abdomen on your bed or a firm surface. You can put a pillow under your hips if that is more comfortable for your lower back. 2. Bend your left / right knee so your foot is straight up in the air. 3. Squeeze your buttocks muscles and lift your left / right thigh off the bed. Do not let your back arch. 4. Tense your thigh muscle as hard as you can without increasing any knee pain. 5. Hold this position for __________ seconds. 6. Slowly lower your leg to the starting position and allow it to relax completely. Repeat __________ times. Complete this exercise __________ times a day. Hip hike 1. Stand sideways on a bottom step. Stand on your left / right leg with your other foot unsupported next to the step. You can hold on to the railing or wall for balance if needed. 2. Keep your knees straight and your torso square. Then lift your left / right hip up toward the ceiling. 3. Slowly let your left / right hip lower toward the floor, past the starting position. Your foot should get closer to the floor. Do not lean or bend your knees. Repeat __________ times. Complete this exercise __________ times a day. This information is not intended to replace advice given to you by your health care provider. Make sure you discuss any questions you have with your health care provider. Document Released: 01/26/2005 Document Revised: 05/19/2018 Document Reviewed: 11/17/2017 Elsevier Patient Education  2020 [employer]

## 2018-09-22 NOTE — Addendum Note (Signed)
Addended by: Carole Binning on: 09/22/2018 11:36 AM   Modules accepted: Orders

## 2018-09-22 NOTE — Telephone Encounter (Signed)
Noted  

## 2018-09-23 NOTE — Progress Notes (Signed)
I will discuss results at the follow-up visit.

## 2018-09-29 ENCOUNTER — Other Ambulatory Visit: Payer: Self-pay | Admitting: Family Medicine

## 2018-10-03 ENCOUNTER — Ambulatory Visit (INDEPENDENT_AMBULATORY_CARE_PROVIDER_SITE_OTHER): Payer: Medicare HMO | Admitting: Family Medicine

## 2018-10-03 ENCOUNTER — Other Ambulatory Visit: Payer: Self-pay

## 2018-10-03 DIAGNOSIS — G43019 Migraine without aura, intractable, without status migrainosus: Secondary | ICD-10-CM | POA: Diagnosis not present

## 2018-10-03 DIAGNOSIS — I10 Essential (primary) hypertension: Secondary | ICD-10-CM

## 2018-10-03 DIAGNOSIS — K219 Gastro-esophageal reflux disease without esophagitis: Secondary | ICD-10-CM

## 2018-10-03 MED ORDER — TOPIRAMATE 100 MG PO TABS: ORAL_TABLET | ORAL | 1 refills | Status: DC

## 2018-10-03 MED ORDER — TRAMADOL HCL 50 MG PO TABS: 50.0000 mg | ORAL_TABLET | Freq: Four times a day (QID) | ORAL | 0 refills | Status: DC | PRN

## 2018-10-03 MED ORDER — PROMETHAZINE HCL 25 MG PO TABS: ORAL_TABLET | ORAL | 2 refills | Status: DC

## 2018-10-03 MED ORDER — FAMOTIDINE 20 MG PO TABS: 20.0000 mg | ORAL_TABLET | Freq: Two times a day (BID) | ORAL | 5 refills | Status: DC

## 2018-10-03 MED ORDER — DULOXETINE HCL 30 MG PO CPEP: ORAL_CAPSULE | ORAL | 5 refills | Status: DC

## 2018-10-03 NOTE — Progress Notes (Signed)
   Subjective:    Patient ID: [name], female    DOB: 1965-10-01, 53 y.o.   MRN: [id number]  Headache  This is a new problem. Episode onset: 3 days. Associated symptoms include nausea. Pertinent negatives include no abdominal pain, coughing, dizziness, rhinorrhea, vomiting or weakness. Associated symptoms comments: Nausea, vomiting. Treatments tried: aleve, topamax. The treatment provided no relief.   Needs refill on pepcid 20mg  once daily.  Not having any major reflux related issues.  Patient is having some mild depression related symptoms related to her grandchildren going up and often not being as close grandson moved off to college granddaughter now in high school Virtual Visit via Telephone Note  I connected with [name] on 10/03/18 at  1:10 PM EDT by telephone and verified that I am speaking with the correct person using two identifiers.  Location: Patient: home Provider: office   I discussed the limitations, risks, security and privacy concerns of performing an evaluation and management service by telephone and the availability of in person appointments. I also discussed with the patient that there may be a patient responsible charge related to this service. The patient expressed understanding and agreed to proceed.   History of Present Illness:    Observations/Objective:   Assessment and Plan:   Follow Up Instructions:    I discussed the assessment and treatment plan with the patient. The patient was provided an opportunity to ask questions and all were answered. The patient agreed with the plan and demonstrated an understanding of the instructions.   The patient was advised to call back or seek an in-person evaluation if the symptoms worsen or if the condition fails to improve as anticipated.  I provided 17 minutes of non-face-to-face time during this encounter.      Review of Systems  Constitutional: Negative for activity change and  appetite change.  HENT: Negative for congestion and rhinorrhea.   Respiratory: Negative for cough and shortness of breath.   Cardiovascular: Negative for chest pain and leg swelling.  Gastrointestinal: Positive for nausea. Negative for abdominal pain and vomiting.  Skin: Negative for color change.  Neurological: Positive for headaches. Negative for dizziness and weakness.  Psychiatric/Behavioral: Negative for agitation and confusion.       Objective:   Physical Exam Today's visit was via telephone Physical exam was not possible for this visit        Assessment & Plan:  Depression related issues increase Cymbalta 30 mg 3 daily follow-up in 3 to 4 weeks see how patient is doing  Migraine related issues Phenergan as needed increase Topamax 100 mg twice daily follow-up again in 4 weeks time to see how patient is doing  Reflux related issues recommend increasing Pepcid 20 mg twice daily  Finally patient having some difficult times adjusting to her grandchildren growing up hopefully after the pandemic settles down the patient can get involved with volunteer work which will help give her a better purpose

## 2018-10-05 LAB — CYCLIC CITRUL PEPTIDE ANTIBODY, IGG/IGA: Cyclic Citrullin Peptide Ab: 9 units (ref 0–19)

## 2018-10-05 LAB — 14.3.3 ETA, RHEUM. ARTHRITIS: 14.3.3 ETA, Rheum. Arthritis: <0.2 ng/mL

## 2018-10-05 LAB — CK: Total CK: 114 U/L (ref 32–182)

## 2018-10-05 LAB — URIC ACID: Uric Acid: 4.9 mg/dL (ref 2.5–7.1)

## 2018-10-06 NOTE — Telephone Encounter (Signed)
Left message to advise patient lab results are normal and she may follow up as needed. Copy of results sent to PCP.

## 2018-10-11 ENCOUNTER — Telehealth: Payer: Self-pay | Admitting: *Deleted

## 2018-10-11 NOTE — Telephone Encounter (Signed)
Fax from [facility]. Pt is diabetic and not currently taking a statin. Please consider if appropriate

## 2018-10-13 NOTE — Telephone Encounter (Signed)
We will address at follow up.

## 2018-10-25 ENCOUNTER — Ambulatory Visit: Payer: Medicare HMO | Admitting: Rheumatology

## 2018-10-27 ENCOUNTER — Ambulatory Visit: Payer: Medicare HMO | Admitting: Nurse Practitioner

## 2018-10-27 ENCOUNTER — Ambulatory Visit: Payer: Medicare HMO | Admitting: Gastroenterology

## 2018-10-31 ENCOUNTER — Other Ambulatory Visit: Payer: Self-pay

## 2018-10-31 ENCOUNTER — Encounter: Payer: Medicare HMO | Admitting: Family Medicine

## 2018-10-31 NOTE — Progress Notes (Signed)
   Subjective:    Patient ID: [name], female    DOB: December 14, 1965, 53 y.o.   MRN: [id number]  HPI  Patient calls for a follow up on migraines.  Review of Systems     Objective:   Physical Exam        Assessment & Plan:

## 2018-11-01 NOTE — Progress Notes (Signed)
This encounter was created in error - please disregard.

## 2018-11-18 ENCOUNTER — Other Ambulatory Visit: Payer: Self-pay | Admitting: Family Medicine

## 2018-11-18 DIAGNOSIS — Z1231 Encounter for screening mammogram for malignant neoplasm of breast: Secondary | ICD-10-CM

## 2018-11-25 ENCOUNTER — Ambulatory Visit (INDEPENDENT_AMBULATORY_CARE_PROVIDER_SITE_OTHER): Payer: Medicare HMO | Admitting: Nurse Practitioner

## 2018-11-25 ENCOUNTER — Encounter: Payer: Self-pay | Admitting: Nurse Practitioner

## 2018-11-25 DIAGNOSIS — I10 Essential (primary) hypertension: Secondary | ICD-10-CM

## 2018-11-25 DIAGNOSIS — E785 Hyperlipidemia, unspecified: Secondary | ICD-10-CM | POA: Insufficient documentation

## 2018-11-25 DIAGNOSIS — E119 Type 2 diabetes mellitus without complications: Secondary | ICD-10-CM | POA: Diagnosis not present

## 2018-11-25 MED ORDER — ROSUVASTATIN CALCIUM 5 MG PO TABS: 5.0000 mg | ORAL_TABLET | Freq: Every day | ORAL | 2 refills | Status: DC

## 2018-11-25 MED ORDER — LOSARTAN POTASSIUM 25 MG PO TABS: 25.0000 mg | ORAL_TABLET | Freq: Every day | ORAL | 2 refills | Status: DC

## 2018-11-25 NOTE — Progress Notes (Signed)
Subjective:    Patient ID: [name], female    DOB: 04-15-1965, 53 y.o.   MRN: [id number]  Diabetes She presents for her follow-up diabetic visit. She has type 2 diabetes mellitus. Hypoglycemia symptoms include headaches. There are no diabetic associated symptoms. There are no hypoglycemic complications. There are no diabetic complications. She does not see a podiatrist.Eye exam is current (has appt next month).   Virtual Visit via Video Note  I connected with [name] on 11/25/18 at  9:20 AM EDT by a video enabled telemedicine application and verified that I am speaking with the correct person using two identifiers.  Location: Patient: home Provider: office   I discussed the limitations of evaluation and management by telemedicine and the availability of in person appointments. The patient expressed understanding and agreed to proceed.  History of Present Illness: Presents via video for recheck on her type 2 diabetes.  States her weight and blood sugars have been stable.  Blood sugar this morning was 109 fasting.  No chest pain/ischemic type pain or shortness of breath.  No burning or pain in the feet.  Does minimal walking.  No change in her appetite states she has a decreased appetite overall.  Slight nausea.  No change in her reflux symptoms.  Defers flu vaccine.  Has her eye exam scheduled for 10/29.   Observations/Objective: Format  Patient present at home Provider present at office Consent for interaction obtained Coronavirus outbreak made virtual visit necessary  NAD.  Alert, oriented.  Calm affect.  Thoughts logical coherent and relevant.  Making good eye contact.  Assessment and Plan: Problem List Items Addressed This Visit      Cardiovascular and Mediastinum   Essential hypertension   Relevant Medications   rosuvastatin (CRESTOR) 5 MG tablet   losartan (COZAAR) 25 MG tablet     Endocrine   Diabetes (HCC) - Primary   Relevant Medications   rosuvastatin (CRESTOR) 5 MG tablet   losartan (COZAAR) 25 MG tablet     Other   Hyperlipidemia LDL goal <70   Relevant Medications   rosuvastatin (CRESTOR) 5 MG tablet   losartan (COZAAR) 25 MG tablet     Meds ordered this encounter  Medications  . rosuvastatin (CRESTOR) 5 MG tablet    Sig: Take 1 tablet (5 mg total) by mouth daily. For cholesterol    Dispense:  30 tablet    Refill:  2    Order Specific Question:   Supervising Provider    Answer:   Lilyan Punt A [[id number]]  . losartan (COZAAR) 25 MG tablet    Sig: Take 1 tablet (25 mg total) by mouth daily. For BP and kidneys    Dispense:  30 tablet    Refill:  2    Order Specific Question:   Supervising Provider    Answer:   Lilyan Punt A [[id number]]     Follow Up Instructions: Discussed use of an ARB and statin to help reduce her risk.  Patient chooses to start both at this time. Reviewed potential adverse affects and benefits. Defers flu vaccine.  Repeat labs in 2 months. Return in about 3 months (around 02/25/2019) for recheck .    I discussed the assessment and treatment plan with the patient. The patient was provided an opportunity to ask questions and all were answered. The patient agreed with the plan and demonstrated an understanding of the instructions.   The patient was advised to call back or seek an in-person  evaluation if the symptoms worsen or if the condition fails to improve as anticipated.  I provided 15 minutes of non-face-to-face time during this encounter.        Review of Systems  Neurological: Positive for headaches.       Objective:   Physical Exam        Assessment & Plan:

## 2018-11-25 NOTE — Addendum Note (Signed)
Addended by: Vicente Males on: 11/25/2018 04:37 PM   Modules accepted: Orders

## 2018-11-29 ENCOUNTER — Other Ambulatory Visit: Payer: Self-pay

## 2018-11-29 ENCOUNTER — Other Ambulatory Visit (INDEPENDENT_AMBULATORY_CARE_PROVIDER_SITE_OTHER): Payer: Medicare HMO | Admitting: *Deleted

## 2018-11-29 DIAGNOSIS — Z23 Encounter for immunization: Secondary | ICD-10-CM | POA: Diagnosis not present

## 2018-12-04 ENCOUNTER — Other Ambulatory Visit: Payer: Self-pay | Admitting: Nurse Practitioner

## 2018-12-05 NOTE — Telephone Encounter (Signed)
Please clarify with patient is she taking minocycline currently? If so what for? May have 6 months on the other medicine metoprolol

## 2018-12-06 NOTE — Telephone Encounter (Signed)
Pt requesting refill for a rash in her private area. Came up 4 days ago. States she got it for follicilitis under her arm and now it is in her private area. Would also like a 90 day of supply of the bp med. She does not know the name of med but is for her kidneys.

## 2018-12-07 NOTE — Telephone Encounter (Signed)
She may have 90 days on the losartan As for the minocycline I recommend you twice daily over the course of the next 10 days then repeat again if necessary #60 if patient has ongoing troubles with this I do recommend that she follow-up with Hoyle Sauer or myself

## 2018-12-08 DIAGNOSIS — E119 Type 2 diabetes mellitus without complications: Secondary | ICD-10-CM | POA: Diagnosis not present

## 2018-12-08 DIAGNOSIS — Z794 Long term (current) use of insulin: Secondary | ICD-10-CM | POA: Diagnosis not present

## 2018-12-08 DIAGNOSIS — H25813 Combined forms of age-related cataract, bilateral: Secondary | ICD-10-CM | POA: Diagnosis not present

## 2018-12-08 DIAGNOSIS — H524 Presbyopia: Secondary | ICD-10-CM | POA: Diagnosis not present

## 2018-12-08 DIAGNOSIS — H04123 Dry eye syndrome of bilateral lacrimal glands: Secondary | ICD-10-CM | POA: Diagnosis not present

## 2018-12-08 LAB — HM DIABETES EYE EXAM

## 2018-12-27 ENCOUNTER — Telehealth: Payer: Self-pay | Admitting: Family Medicine

## 2018-12-27 MED ORDER — LOSARTAN POTASSIUM 25 MG PO TABS: 25.0000 mg | ORAL_TABLET | Freq: Every day | ORAL | 0 refills | Status: DC

## 2018-12-27 NOTE — Telephone Encounter (Signed)
Pt would like losartan (COZAAR) 25 MG tablet changed to a 90 day supply

## 2018-12-27 NOTE — Telephone Encounter (Signed)
Prescription sent electronically to pharmacy. Patient notified. 

## 2018-12-28 ENCOUNTER — Other Ambulatory Visit: Payer: Self-pay

## 2018-12-28 ENCOUNTER — Ambulatory Visit
Admission: RE | Admit: 2018-12-28 | Discharge: 2018-12-28 | Disposition: A | Discharge status: Home / Self Care | Payer: Medicare HMO | Source: Ambulatory Visit | Attending: Family Medicine | Admitting: Family Medicine

## 2018-12-28 DIAGNOSIS — Z1231 Encounter for screening mammogram for malignant neoplasm of breast: Secondary | ICD-10-CM | POA: Diagnosis not present

## 2018-12-30 ENCOUNTER — Other Ambulatory Visit: Payer: Self-pay | Admitting: Family Medicine

## 2018-12-30 DIAGNOSIS — R928 Other abnormal and inconclusive findings on diagnostic imaging of breast: Secondary | ICD-10-CM

## 2019-01-04 ENCOUNTER — Other Ambulatory Visit: Payer: Medicare HMO

## 2019-01-13 ENCOUNTER — Other Ambulatory Visit: Payer: Self-pay

## 2019-01-13 ENCOUNTER — Ambulatory Visit
Admission: RE | Admit: 2019-01-13 | Discharge: 2019-01-13 | Disposition: A | Discharge status: Home / Self Care | Payer: Medicare HMO | Source: Ambulatory Visit | Attending: Family Medicine | Admitting: Family Medicine

## 2019-01-13 ENCOUNTER — Ambulatory Visit: Payer: Medicare HMO

## 2019-01-13 DIAGNOSIS — R928 Other abnormal and inconclusive findings on diagnostic imaging of breast: Secondary | ICD-10-CM

## 2019-01-17 ENCOUNTER — Telehealth: Payer: Self-pay | Admitting: Family Medicine

## 2019-01-17 NOTE — Telephone Encounter (Signed)
Patient states having a lot of joint pain and wanting add to labs to test any type of arthritis .She has appointment 1/15 with Hoyle Sauer

## 2019-01-17 NOTE — Telephone Encounter (Signed)
Dr. Nicki Reaper ordered all of these tests back in the summer. I agree with him. If she is still having severe joint pain, we both recommend referral to a rheumatologist. Thanks.

## 2019-01-18 NOTE — Telephone Encounter (Signed)
Pt is requesting to not go back to Dr. Estanislado Pandy.

## 2019-01-18 NOTE — Telephone Encounter (Signed)
Patient notified and stated she would like to see specialist to be evaluated for fibromyalgia. Patient states she already saw a specialist a few months and all they evaluated her for was rheumatoid arthritis and she thinks It is fibromyalgia

## 2019-01-19 NOTE — Telephone Encounter (Signed)
Patient scheduled virtual visit with Hoyle Sauer to discuss fibromyalgia.

## 2019-01-19 NOTE — Telephone Encounter (Signed)
I would like to talk to the patient first especially if she suspects fibromyalgia. The rheumatologist may diagnose this but they always refer back to [place] for treatment. She and I can review the criteria for diagnosis. Thanks. (this can be a virtual or phone visit)

## 2019-01-26 DIAGNOSIS — E785 Hyperlipidemia, unspecified: Secondary | ICD-10-CM | POA: Diagnosis not present

## 2019-01-26 DIAGNOSIS — E119 Type 2 diabetes mellitus without complications: Secondary | ICD-10-CM | POA: Diagnosis not present

## 2019-01-26 DIAGNOSIS — I10 Essential (primary) hypertension: Secondary | ICD-10-CM | POA: Diagnosis not present

## 2019-01-27 ENCOUNTER — Other Ambulatory Visit: Payer: Self-pay

## 2019-01-27 ENCOUNTER — Ambulatory Visit (INDEPENDENT_AMBULATORY_CARE_PROVIDER_SITE_OTHER): Payer: Medicare HMO | Admitting: Nurse Practitioner

## 2019-01-27 DIAGNOSIS — M159 Polyosteoarthritis, unspecified: Secondary | ICD-10-CM | POA: Diagnosis not present

## 2019-01-27 DIAGNOSIS — G8929 Other chronic pain: Secondary | ICD-10-CM

## 2019-01-27 DIAGNOSIS — R52 Pain, unspecified: Secondary | ICD-10-CM

## 2019-01-27 DIAGNOSIS — M79671 Pain in right foot: Secondary | ICD-10-CM | POA: Diagnosis not present

## 2019-01-27 LAB — HEPATIC FUNCTION PANEL
ALT: 11 IU/L (ref 0–32)
AST: 15 IU/L (ref 0–40)
Albumin: 4.6 g/dL (ref 3.8–4.9)
Alkaline Phosphatase: 72 IU/L (ref 39–117)
Bilirubin Total: 0.3 mg/dL (ref 0.0–1.2)
Bilirubin, Direct: 0.1 mg/dL (ref 0.00–0.40)
Total Protein: 7.4 g/dL (ref 6.0–8.5)

## 2019-01-27 LAB — LIPID PANEL
Chol/HDL Ratio: 3.3 ratio (ref 0.0–4.4)
Cholesterol, Total: 116 mg/dL (ref 100–199)
HDL: 35 mg/dL — ABNORMAL LOW (ref >39)
LDL Chol Calc (NIH): 55 mg/dL (ref 0–99)
Triglycerides: 152 mg/dL — ABNORMAL HIGH (ref 0–149)
VLDL Cholesterol Cal: 26 mg/dL (ref 5–40)

## 2019-01-27 LAB — MICROALBUMIN / CREATININE URINE RATIO
Creatinine, Urine: 185.8 mg/dL
Microalb/Creat Ratio: 6 mg/g creat (ref 0–29)
Microalbumin, Urine: 11.2 ug/mL

## 2019-01-27 LAB — HEMOGLOBIN A1C
Est. average glucose Bld gHb Est-mCnc: 131 mg/dL
Hgb A1c MFr Bld: 6.2 % — ABNORMAL HIGH (ref 4.8–5.6)

## 2019-01-27 MED ORDER — DICLOFENAC SODIUM 75 MG PO TBEC: 75.0000 mg | DELAYED_RELEASE_TABLET | Freq: Two times a day (BID) | ORAL | 0 refills | Status: DC

## 2019-01-27 NOTE — Progress Notes (Addendum)
Subjective:    Patient ID: [name], female    DOB: Aug 06, 1965, 53 y.o.   MRN: [id number]  HPI  Patient calls to discuss possible fibromyalgia. Patient reports severe joint pain since June- worse for the last several months. Patient has seen rheumatology and they told her it was not rheumatoid arthritis.  Virtual Visit via Video Note  I connected with [name] on 01/27/19 at  1:40 PM EST by a video enabled telemedicine application and verified that I am speaking with the correct person using two identifiers.  Location: Patient: home Provider: office   I discussed the limitations of evaluation and management by telemedicine and the availability of in person appointments. The patient expressed understanding and agreed to proceed.  History of Present Illness: Presents to discuss chronic pain.  Was seen by rheumatologist on 09/22/2018.  See progress note.  Patient has significant osteoarthritis in multiple joints.  Pain has gotten to the point where is difficult to function and perform ADLs.  At this time has specific worsening pain in her right foot which makes it hard to bear weight.  Has not seen an orthopedic specialist.  Has tried multiple modalities but they do not seem to be working at this point.  Has seen a pain management specialist in the past but not recently.  Is concerned about possibility of fibromyalgia.  Positive family history: Sister.   Observations/Objective: Today's visit was via telephone Physical exam was not possible for this visit Reviewed labs from rheumatology visit on 8/13. Recent Results (from the past 2160 hour(s))  HM DIABETES EYE EXAM     Status: None   Collection Time: 12/08/18 12:00 AM  Result Value Ref Range   HM Diabetic Eye Exam    Lipid Profile     Status: Abnormal   Collection Time: 01/26/19 10:32 AM  Result Value Ref Range   Cholesterol, Total 116 100 - 199 mg/dL   Triglycerides 152 (H) 0 - 149 mg/dL   HDL 35 (L) >39  mg/dL   VLDL Cholesterol Cal 26 5 - 40 mg/dL   LDL Chol Calc (NIH) 55 0 - 99 mg/dL   Chol/HDL Ratio 3.3 0.0 - 4.4 ratio    Comment:                                   T. Chol/HDL Ratio                                             Men  Women                               1/2 Avg.Risk  3.4    3.3                                   Avg.Risk  5.0    4.4                                2X Avg.Risk  9.6    7.1  3X Avg.Risk 23.4   11.0   Hepatic function panel     Status: None   Collection Time: 01/26/19 10:32 AM  Result Value Ref Range   Total Protein 7.4 6.0 - 8.5 g/dL   Albumin 4.6 3.8 - 4.9 g/dL   Bilirubin Total 0.3 0.0 - 1.2 mg/dL   Bilirubin, Direct 8.52 0.00 - 0.40 mg/dL   Alkaline Phosphatase 72 39 - 117 IU/L   AST 15 0 - 40 IU/L   ALT 11 0 - 32 IU/L  Hemoglobin A1c     Status: Abnormal   Collection Time: 01/26/19 10:32 AM  Result Value Ref Range   Hgb A1c MFr Bld 6.2 (H) 4.8 - 5.6 %    Comment:          Prediabetes: 5.7 - 6.4          Diabetes: >6.4          Glycemic control for adults with diabetes: <7.0    Est. average glucose Bld gHb Est-mCnc 131 mg/dL  Urine Microalbumin w/creat. ratio     Status: None   Collection Time: 01/26/19 10:32 AM  Result Value Ref Range   Creatinine, Urine 185.8 Not Estab. mg/dL   Microalbumin, Urine 77.8 Not Estab. ug/mL   Microalb/Creat Ratio 6 0 - 29 mg/g creat    Comment:                        Normal:                0 -  29                        Moderately increased: 30 - 300                        Severely increased:       >300    Reviewed recent labs with patient during visit.   Assessment and Plan: Problem List Items Addressed This Visit      Musculoskeletal and Integument   Osteoarthritis involving multiple joints on both sides of body - Primary   Relevant Medications   diclofenac (VOLTAREN) 75 MG EC tablet   Other Relevant Orders   Ambulatory referral to Orthopedic Surgery   Ambulatory referral  to Pain Clinic     Other   Chronic pain of multiple sites   Relevant Medications   diclofenac (VOLTAREN) 75 MG EC tablet   Other Relevant Orders   Ambulatory referral to Pain Clinic    Other Visit Diagnoses    Right foot pain       Relevant Orders   Ambulatory referral to Orthopedic Surgery   Ambulatory referral to Pain Clinic     Meds ordered this encounter  Medications  . diclofenac (VOLTAREN) 75 MG EC tablet    Sig: Take 1 tablet (75 mg total) by mouth 2 (two) times daily.    Dispense:  180 tablet    Refill:  0    Order Specific Question:   Supervising Provider    Answer:   Lilyan Punt A [[id number]]     Follow Up Instructions: Take diclofenac as directed with food.  DC if any stomach upset.  For now continue Cymbalta as directed.  Referrals to orthopedic specialist and pain management.  Explained to patient that her issues at this point are chronic and her goal is to get her pain under control  so that she can function.  Call back in the meantime if needed. A review of rheumatology notes shows that fibromyalgia is still considered a possible diagnosis.   I discussed the assessment and treatment plan with the patient. The patient was provided an opportunity to ask questions and all were answered. The patient agreed with the plan and demonstrated an understanding of the instructions.   The patient was advised to call back or seek an in-person evaluation if the symptoms worsen or if the condition fails to improve as anticipated.  I provided 15 minutes of non-face-to-face time during this encounter.      Review of Systems     Objective:   Physical Exam        Assessment & Plan:

## 2019-01-28 ENCOUNTER — Encounter: Payer: Self-pay | Admitting: Nurse Practitioner

## 2019-01-28 DIAGNOSIS — R52 Pain, unspecified: Secondary | ICD-10-CM | POA: Insufficient documentation

## 2019-01-28 DIAGNOSIS — G8929 Other chronic pain: Secondary | ICD-10-CM | POA: Insufficient documentation

## 2019-01-28 DIAGNOSIS — M159 Polyosteoarthritis, unspecified: Secondary | ICD-10-CM | POA: Insufficient documentation

## 2019-02-06 ENCOUNTER — Other Ambulatory Visit: Payer: Self-pay | Admitting: Family Medicine

## 2019-02-06 ENCOUNTER — Encounter: Payer: Self-pay | Admitting: Family Medicine

## 2019-02-06 ENCOUNTER — Other Ambulatory Visit: Payer: Self-pay | Admitting: Nurse Practitioner

## 2019-02-15 DIAGNOSIS — E559 Vitamin D deficiency, unspecified: Secondary | ICD-10-CM | POA: Diagnosis not present

## 2019-02-15 DIAGNOSIS — Z79899 Other long term (current) drug therapy: Secondary | ICD-10-CM | POA: Diagnosis not present

## 2019-02-15 DIAGNOSIS — M129 Arthropathy, unspecified: Secondary | ICD-10-CM | POA: Diagnosis not present

## 2019-02-15 DIAGNOSIS — G43909 Migraine, unspecified, not intractable, without status migrainosus: Secondary | ICD-10-CM | POA: Diagnosis not present

## 2019-02-20 DIAGNOSIS — M545 Low back pain: Secondary | ICD-10-CM | POA: Diagnosis not present

## 2019-02-20 DIAGNOSIS — M47816 Spondylosis without myelopathy or radiculopathy, lumbar region: Secondary | ICD-10-CM | POA: Diagnosis not present

## 2019-02-20 DIAGNOSIS — M79671 Pain in right foot: Secondary | ICD-10-CM | POA: Diagnosis not present

## 2019-02-20 DIAGNOSIS — M47896 Other spondylosis, lumbar region: Secondary | ICD-10-CM | POA: Diagnosis not present

## 2019-02-24 ENCOUNTER — Encounter: Payer: Self-pay | Admitting: Nurse Practitioner

## 2019-02-24 ENCOUNTER — Ambulatory Visit (INDEPENDENT_AMBULATORY_CARE_PROVIDER_SITE_OTHER): Payer: Medicare HMO | Admitting: Nurse Practitioner

## 2019-02-24 ENCOUNTER — Other Ambulatory Visit: Payer: Self-pay

## 2019-02-24 VITALS — BP 118/72 | Temp 97.3°F | Wt 210.4 lb

## 2019-02-24 DIAGNOSIS — E119 Type 2 diabetes mellitus without complications: Secondary | ICD-10-CM | POA: Diagnosis not present

## 2019-02-24 DIAGNOSIS — I10 Essential (primary) hypertension: Secondary | ICD-10-CM | POA: Diagnosis not present

## 2019-02-24 NOTE — Progress Notes (Signed)
Subjective:    Patient ID: [name], female    DOB: Sep 06, 1965, 54 y.o.   MRN: [id number]  Diabetes She presents for her follow-up diabetic visit. She has type 2 diabetes mellitus. Risk factors for coronary artery disease include diabetes mellitus, dyslipidemia, hypertension and post-menopausal. Current diabetic treatments: victoza. She is compliant with treatment all of the time. Her weight is stable. She is following a diabetic diet.   Discuss recent labs See above. Had a recent eye exam. Has some cataracts but no retinopathy. Tolerating Victoza without difficulty. Has started seeing orthopedic about right foot issues. Wearing a boot with walking. Has seen the pain specialist. No changes in regimen but has been referred to neuro on 2/19. Does not check her sugar on a regular basis. Needs machine and supplies. Minimal activity due to OA. Some fatigue. No CP/ischemic type pain or unusual SOB.   Review of Systems     Objective:   Physical Exam NAD. Alert, oriented. Lungs clear. Heart RRR. LE: no edema.  Diabetic Foot Exam - Simple   Simple Foot Form Diabetic Foot exam was performed with the following findings: Yes   Visual Inspection No deformities, no ulcerations, no other skin breakdown bilaterally: Yes Sensation Testing Intact to touch and monofilament testing bilaterally: Yes Pulse Check See comments: Yes Comments DP pulses palpated bilat; normal cap refill    Recent Results (from the past 2160 hour(s))  HM DIABETES EYE EXAM     Status: None   Collection Time: 12/08/18 12:00 AM  Result Value Ref Range   HM Diabetic Eye Exam    Lipid Profile     Status: Abnormal   Collection Time: 01/26/19 10:32 AM  Result Value Ref Range   Cholesterol, Total 116 100 - 199 mg/dL   Triglycerides 152 (H) 0 - 149 mg/dL   HDL 35 (L) >39 mg/dL   VLDL Cholesterol Cal 26 5 - 40 mg/dL   LDL Chol Calc (NIH) 55 0 - 99 mg/dL   Chol/HDL Ratio 3.3 0.0 - 4.4 ratio    Comment:                                    T. Chol/HDL Ratio                                             Men  Women                               1/2 Avg.Risk  3.4    3.3                                   Avg.Risk  5.0    4.4                                2X Avg.Risk  9.6    7.1                                3X Avg.Risk 23.4   11.0   Hepatic function panel  Status: None   Collection Time: 01/26/19 10:32 AM  Result Value Ref Range   Total Protein 7.4 6.0 - 8.5 g/dL   Albumin 4.6 3.8 - 4.9 g/dL   Bilirubin Total 0.3 0.0 - 1.2 mg/dL   Bilirubin, Direct 7.78 0.00 - 0.40 mg/dL   Alkaline Phosphatase 72 39 - 117 IU/L   AST 15 0 - 40 IU/L   ALT 11 0 - 32 IU/L  Hemoglobin A1c     Status: Abnormal   Collection Time: 01/26/19 10:32 AM  Result Value Ref Range   Hgb A1c MFr Bld 6.2 (H) 4.8 - 5.6 %    Comment:          Prediabetes: 5.7 - 6.4          Diabetes: >6.4          Glycemic control for adults with diabetes: <7.0    Est. average glucose Bld gHb Est-mCnc 131 mg/dL  Urine Microalbumin w/creat. ratio     Status: None   Collection Time: 01/26/19 10:32 AM  Result Value Ref Range   Creatinine, Urine 185.8 Not Estab. mg/dL   Microalbumin, Urine 24.2 Not Estab. ug/mL   Microalb/Creat Ratio 6 0 - 29 mg/g creat    Comment:                        Normal:                0 -  29                        Moderately increased: 30 - 300                        Severely increased:       >300    Reviewed lab results with patient and discussed concerns.         Assessment & Plan:   Problem List Items Addressed This Visit      Cardiovascular and Mediastinum   Essential hypertension     Endocrine   Diabetes (HCC) - Primary     Recommend activity such as chair aerobics or walking program.  Will send in order for glucose testing machine and supplies. Check sugar about once daily and bring results to next visit.  Continue current medications as planned. Follow up with specialists as planned.  Return in  about 3 months (around 05/25/2019).

## 2019-02-26 ENCOUNTER — Encounter: Payer: Self-pay | Admitting: Nurse Practitioner

## 2019-02-28 ENCOUNTER — Other Ambulatory Visit: Payer: Self-pay | Admitting: *Deleted

## 2019-02-28 MED ORDER — BLOOD GLUCOSE METER KIT: PACK | 11 refills | Status: AC

## 2019-03-03 DIAGNOSIS — R69 Illness, unspecified: Secondary | ICD-10-CM | POA: Diagnosis not present

## 2019-03-29 ENCOUNTER — Encounter: Payer: Self-pay | Admitting: *Deleted

## 2019-03-31 ENCOUNTER — Ambulatory Visit: Payer: Medicare HMO | Admitting: Neurology

## 2019-04-06 DIAGNOSIS — M1711 Unilateral primary osteoarthritis, right knee: Secondary | ICD-10-CM | POA: Diagnosis not present

## 2019-04-11 DIAGNOSIS — R69 Illness, unspecified: Secondary | ICD-10-CM | POA: Diagnosis not present

## 2019-04-13 ENCOUNTER — Other Ambulatory Visit: Payer: Self-pay | Admitting: *Deleted

## 2019-04-13 MED ORDER — LOSARTAN POTASSIUM 25 MG PO TABS: 25.0000 mg | ORAL_TABLET | Freq: Every day | ORAL | 0 refills | Status: DC

## 2019-04-14 DIAGNOSIS — R69 Illness, unspecified: Secondary | ICD-10-CM | POA: Diagnosis not present

## 2019-04-25 DIAGNOSIS — R69 Illness, unspecified: Secondary | ICD-10-CM | POA: Diagnosis not present

## 2019-05-08 ENCOUNTER — Encounter: Payer: Self-pay | Admitting: Neurology

## 2019-05-08 ENCOUNTER — Ambulatory Visit: Payer: Medicare HMO | Admitting: Neurology

## 2019-05-08 ENCOUNTER — Other Ambulatory Visit: Payer: Self-pay

## 2019-05-08 VITALS — BP 146/88 | HR 54 | Temp 97.5°F | Ht 65.0 in | Wt 207.0 lb

## 2019-05-08 DIAGNOSIS — G441 Vascular headache, not elsewhere classified: Secondary | ICD-10-CM

## 2019-05-08 DIAGNOSIS — H538 Other visual disturbances: Secondary | ICD-10-CM | POA: Diagnosis not present

## 2019-05-08 DIAGNOSIS — R519 Headache, unspecified: Secondary | ICD-10-CM | POA: Diagnosis not present

## 2019-05-08 DIAGNOSIS — R51 Headache with orthostatic component, not elsewhere classified: Secondary | ICD-10-CM | POA: Diagnosis not present

## 2019-05-08 DIAGNOSIS — G43709 Chronic migraine without aura, not intractable, without status migrainosus: Secondary | ICD-10-CM

## 2019-05-08 MED ORDER — AJOVY 225 MG/1.5ML ~~LOC~~ SOAJ: 225.0000 mg | SUBCUTANEOUS | 2 refills | Status: DC

## 2019-05-08 MED ORDER — AJOVY 225 MG/1.5ML ~~LOC~~ SOAJ: 225.0000 mg | SUBCUTANEOUS | 4 refills | Status: DC

## 2019-05-08 NOTE — Progress Notes (Signed)
[facility]    Provider:  Dr Jaynee Eagles Requesting Provider: Jeanella Anton, NP Primary Care Provider: [facility]  CC:  migraines  HPI:  [name] is a 54 y.o. female here as requested by Jeanella Anton for migraines.  She has a past medical history of sleep apnea, migraines, lumbar spondylosis, IBS, hypertension, GERD, diabetes, chronic pain syndrome and chronic low back pain, arthritis, anxiety, chronic cough.  I reviewed [employer] notes, patient reports aura, headache, photophobia, phonophobia, nausea and vomiting, no vision loss, headache located in the entire head, radiates to the entire head and neck, pulsating throbbing sharp dull achy and knifelike.  She has had these for 40 years and they occur constantly and they last for 24 hours, severe and worsening.  Migraine triggers include fatigue and sleep deprivation.  Patient reports relief with Tylenol or NSAIDs, triptans, rest, sleep quiet cool room in darkened room.  She has associated vertigo, none associated paresthesias or autonomic symptoms.  She is on Topamax.  She is currently able to do activities of daily living without limitations.  By report there is poor compliance with treatment.  She is reported nothing works.  She was evaluated years ago that headache center.  I also reviewed lab work which included low vitamin D at 20, ANA negative, rheumatoid factor within normal limits.   She has had migraines since the age of 81, getting more persistent, she had one recently for 6 days, she has light and sound sensitivity, nausea, vomiting, pounding/pulsating/throbbing, aching, it can start unilaterally and spread to the whole head even in the back, she is having daily headaches and at least 15 moderately severe to severe migraine days. Laying down helps. It can be severe, for 6 days it was terrible, she couldn't eat. She wakes up with headaches, they are worse in the morning and are positional,  she has blurry vision, daily migraines and headaches, worsening, unknown triggers, ongoing for over a year or more with worsening and exacerbations. Also associated dizziness,fatigue. No other focal neurologic deficits, associated symptoms, inciting events or modifiable factors.    Reviewed notes, labs and imaging from outside physicians, which showed:  Medications tried include Cymbalta, Topamax, diclofenac, Tylenol, NSAIDs, metoprolol, Robaxin, Toradol injections, losartan, injections of Depo-Medrol, Zofran injections and disintegrating tablet, prednisone tablets, promethazine 25 mg tablets, trazodone, tramadol, Topamax up to 200 mg a day  TSH normal  Review of Systems: Patient complains of symptoms per HPI as well as the following symptoms: headache, sleep apnea. Pertinent negatives and positives per HPI. All others negative.   Social History   Socioeconomic History  . Marital status: Single    Spouse name: Not on file  . Number of children: 1  . Years of education: Not on file  . Highest education level: High school graduate  Occupational History  . Occupation: unemployed  Tobacco Use  . Smoking status: Never Smoker  . Smokeless tobacco: Never Used  Substance and Sexual Activity  . Alcohol use: No    Alcohol/week: 0.0 standard drinks  . Drug use: No  . Sexual activity: Not Currently    Birth control/protection: Surgical  Other Topics Concern  . Not on file  Social History Narrative   Lives alone   Right handed   Caffeine: maybe 1 cup of tea if she goes out to dinner. "I don't really do caffeine".   Social Determinants of Health   Financial Resource Strain:   . Difficulty of Paying Living Expenses:   Food  Insecurity:   . Worried About Charity fundraiser in the Last Year:   . Arboriculturist in the Last Year:   Transportation Needs:   . Film/video editor (Medical):   Marland Kitchen Lack of Transportation (Non-Medical):   Physical Activity:   . Days of Exercise per Week:    . Minutes of Exercise per Session:   Stress:   . Feeling of Stress :   Social Connections:   . Frequency of Communication with Friends and Family:   . Frequency of Social Gatherings with Friends and Family:   . Attends Religious Services:   . Active Member of Clubs or Organizations:   . Attends Archivist Meetings:   Marland Kitchen Marital Status:   Intimate Partner Violence:   . Fear of Current or Ex-Partner:   . Emotionally Abused:   Marland Kitchen Physically Abused:   . Sexually Abused:     Family History  Problem Relation Age of Onset  . Stroke Mother   . Hypertension Mother   . Diabetes Mother   . Hypercholesterolemia Mother   . Depression Mother   . Anxiety disorder Mother   . Thyroid disease Father   . Diabetes Father   . Hypertension Sister   . Hypertension Brother   . Cancer Paternal Grandfather        prostate  . Colon cancer Maternal Grandfather   . Healthy Daughter   . Migraines Neg Hx     Past Medical History:  Diagnosis Date  . Allergy   . Anxiety   . Arthritis   . Chronic cough   . Chronic low back pain   . Chronic pain syndrome   . Diabetes mellitus without complication ([place])   . Folliculitis 2/95/6213  . GERD (gastroesophageal reflux disease)   . Hidradenitis suppurativa   . Hypertension   . IBS (irritable bowel syndrome)   . Lumbar spondylolysis   . Migraine headache   . Pulmonary nodule   . Reactive airway disease 01/2018   per PFT's  . Sleep apnea     Patient Active Problem List   Diagnosis Date Noted  . Osteoarthritis involving multiple joints on both sides of body 01/28/2019  . Chronic pain of multiple sites 01/28/2019  . Hyperlipidemia LDL goal <70 11/25/2018  . Herpes labialis 07/20/2017  . Depression, major, single episode, moderate ([place]) 07/21/2016  . Chronic bilateral low back pain 12/02/2015  . Dysphagia 09/11/2015  . Gastroesophageal reflux disease without esophagitis 07/24/2015  . Colon cancer screening   . Encounter for screening  colonoscopy 03/14/2015  . Insomnia 11/26/2014  . Varicose veins 11/26/2014  . Osteoarthritis of both knees 08/28/2014  . Sleep apnea 05/25/2014  . Depression 02/04/2014  . Skin abscess 12/01/2012  . Hidradenitis suppurativa 08/03/2012  . Diabetes ([place]) 07/20/2012  . Folliculitis 08/65/7846  . Migraine 06/04/2008  . Essential hypertension 06/04/2008  . CONSTIPATION, CHRONIC 06/04/2008  . Chronic arthralgias of knees and hips 06/04/2008  . ABDOMINAL PAIN 06/04/2008    Past Surgical History:  Procedure Laterality Date  . ABDOMINAL HYSTERECTOMY    . COLONOSCOPY WITH PROPOFOL N/A 04/08/2015   Dr. Gala Romney: normal, repeat in 10 years   . ENDOMETRIAL ABLATION    . FOREIGN BODY REMOVAL N/A 05/26/2013   Procedure: FOREIGN BODY REMOVAL ADULT ABDOMINAL WALL;  Surgeon: Jamesetta So, MD;  Location: [facility];  Service: General;  Laterality: N/A;  . KNEE SURGERY Right 2012  . PILONIDAL CYST EXCISION  June 2011  .  TUBAL LIGATION      Current Outpatient Medications  Medication Sig Dispense Refill  . albuterol (PROVENTIL HFA;VENTOLIN HFA) 108 (90 Base) MCG/ACT inhaler Inhale 2 puffs into the lungs every 4 (four) hours as needed. 1 Inhaler 5  . Ascorbic Acid (VITAMIN C PO) Take by mouth.    Marland Kitchen aspirin EC 81 MG tablet Take 81 mg by mouth daily.    . blood glucose meter kit and supplies Dispense based on patient and insurance preference. Use to test sugar once a day. For dx E11.9 1 each 11  . diclofenac (VOLTAREN) 75 MG EC tablet Take 1 tablet (75 mg total) by mouth 2 (two) times daily. 180 tablet 0  . DULoxetine (CYMBALTA) 30 MG capsule Take 3 capsules po daily 90 capsule 5  . famotidine (PEPCID) 20 MG tablet Take 1 tablet (20 mg total) by mouth 2 (two) times daily. 60 tablet 5  . fluticasone (FLONASE) 50 MCG/ACT nasal spray Place 2 sprays into both nostrils daily as needed for allergies or rhinitis. 16 g 5  . fluticasone (FLOVENT HFA) 110 MCG/ACT inhaler Inhale 2 puffs into the lungs 2 (two) times  daily. 1 Inhaler 5  . loratadine (CLARITIN) 10 MG tablet Take 1 tablet (10 mg total) by mouth daily. (Patient taking differently: Take 10 mg by mouth as needed. ) 30 tablet 3  . losartan (COZAAR) 25 MG tablet Take 1 tablet (25 mg total) by mouth daily. For BP and kidneys 90 tablet 0  . metoprolol tartrate (LOPRESSOR) 100 MG tablet Take 1 tablet by mouth twice daily 180 tablet 0  . minocycline (MINOCIN) 100 MG capsule TAKE 1 CAPSULE BY MOUTH TWICE DAILY FOR  INFECTION 20 capsule 1  . Multiple Vitamins-Minerals (MULTIPLE VITAMINS/WOMENS PO) Take by mouth.    . promethazine (PHENERGAN) 25 MG tablet Take one half to one tablet bid prn nausea 20 tablet 2  . RELION PEN NEEDLES 31G X 6 MM MISC AS DIRECTED FOR  VICTOZA  PEN 100 each 4  . rosuvastatin (CRESTOR) 5 MG tablet TAKE 1 TABLET BY MOUTH ONCE DAILY FOR CHOLESTEROL 90 tablet 0  . topiramate (TOPAMAX) 100 MG tablet Take one bid 180 tablet 1  . traMADol (ULTRAM) 50 MG tablet Take 1 tablet (50 mg total) by mouth every 6 (six) hours as needed. 15 tablet 0  . triamterene-hydrochlorothiazide (MAXZIDE-25) 37.5-25 MG tablet Take 1 tablet by mouth once daily 90 tablet 0  . VICTOZA 18 MG/3ML SOPN INJECT 0.3 MLS (1.8 MG TOTAL) INTO THE SKIN DAILY 27 mL 2  . VITAMIN D PO Take by mouth.    . Fremanezumab-vfrm (AJOVY) 225 MG/1.5ML SOAJ Inject 225 mg into the skin every 30 (thirty) days. 1 pen 2   No current facility-administered medications for this visit.    Allergies as of 05/08/2019 - Review Complete 05/08/2019  Allergen Reaction Noted  . Bactrim [sulfamethoxazole-trimethoprim] Rash 03/16/2013  . Lisinopril  08/28/2014  . Other  08/03/2012  . Metformin and related  12/21/2017    Vitals: BP (!) 146/88 (BP Location: Right Arm, Patient Position: Sitting)   Pulse (!) 54   Temp (!) 97.5 F (36.4 C) Comment: taken at front  Ht 5' 5" (1.651 m)   Wt 207 lb (93.9 kg)   BMI 34.45 kg/m  Last Weight:  Wt Readings from Last 1 Encounters:  05/08/19 207 lb  (93.9 kg)   Last Height:   Ht Readings from Last 1 Encounters:  05/08/19 5' 5" (1.651 m)  Physical exam: Exam: Gen: NAD, conversant, well nourised, obese, well groomed                     CV: RRR, no MRG. No Carotid Bruits. No peripheral edema, warm, nontender Eyes: Conjunctivae clear without exudates or hemorrhage  Neuro: Detailed Neurologic Exam  Speech:    Speech is normal; fluent and spontaneous with normal comprehension.  Cognition:    The patient is oriented to person, place, and time;     recent and remote memory intact;     language fluent;     normal attention, concentration,     fund of knowledge Cranial Nerves:    The pupils are equal, round, and reactive to light. The fundi are normal and spontaneous venous pulsations are present. Visual fields are full to finger confrontation. Extraocular movements are intact. Trigeminal sensation is intact and the muscles of mastication are normal. The face is symmetric. The palate elevates in the midline. Hearing intact. Voice is normal. Shoulder shrug is normal. The tongue has normal motion without fasciculations.   Coordination:    No dysmetria   Gait:  normal native gait  Motor Observation:    No asymmetry, no atrophy, and no involuntary movements noted. Tone:    Normal muscle tone.    Posture:    Posture is normal. normal erect    Strength:    Strength is V/V in the upper and lower limbs.      Sensation: intact to LT     Reflex Exam:  DTR's:    Deep tendon reflexes in the upper and lower extremities are symmetrical bilaterally.   Toes:    The toes are downgoing bilaterally.   Clonus:    Clonus is absent.    Assessment/Plan:  54 y.o. female here as requested by Jeanella Anton for migraines.  She has a past medical history of sleep apnea, migraines, lumbar spondylosis, IBS, hypertension, GERD, diabetes, chronic pain syndrome and chronic low back pain, arthritis, anxiety, chronic cough.   - She has  failed multiple medications and compliance is an issue. She has constipation so will not give Aimovig, will try Ajovy and see if insurance approves otherwise try emgality because she has failed multiple classes of migrine medications and compliance is an issue so monthly injections would be best for patient I think.   - Also discussed her untreated sleep apnea and sequelae including headaches, stroke and others.  - MRI brain due to concerning symptoms of morning headaches, positional headaches,vision changes  to look for space occupying mass, chiari or intracranial hypertension (pseudotumor).  Discussed: To prevent or relieve headaches, try the following: Cool Compress. Lie down and place a cool compress on your head.  Avoid headache triggers. If certain foods or odors seem to have triggered your migraines in the past, avoid them. A headache diary might help you identify triggers.  Include physical activity in your daily routine. Try a daily walk or other moderate aerobic exercise.  Manage stress. Find healthy ways to cope with the stressors, such as delegating tasks on your to-do list.  Practice relaxation techniques. Try deep breathing, yoga, massage and visualization.  Eat regularly. Eating regularly scheduled meals and maintaining a healthy diet might help prevent headaches. Also, drink plenty of fluids.  Follow a regular sleep schedule. Sleep deprivation might contribute to headaches Consider biofeedback. With this mind-body technique, you learn to control certain bodily functions -- such as muscle tension, heart rate and blood pressure -- to  prevent headaches or reduce headache pain.    Proceed to emergency room if you experience new or worsening symptoms or symptoms do not resolve, if you have new neurologic symptoms or if headache is severe, or for any concerning symptom.   Provided education and documentation from American headache Society toolbox including articles on: chronic migraine  medication overuse headache, chronic migraines, prevention of migraines, behavioral and other nonpharmacologic treatments for headache.    Orders Placed This Encounter  Procedures  . MR BRAIN W WO CONTRAST  . Basic Metabolic Panel   Meds ordered this encounter  Medications  . Fremanezumab-vfrm (AJOVY) 225 MG/1.5ML SOAJ    Sig: Inject 225 mg into the skin every 30 (thirty) days.    Dispense:  1 pen    Refill:  2    Cc: Jeanella Anton, NP, [facility]  Sarina Ill, MD  [facility] [address] [place], [place] [address]  Phone [phone] Fax [phone]

## 2019-05-08 NOTE — Addendum Note (Signed)
Addended by: Naomie Dean B on: 05/08/2019 02:17 PM   Modules accepted: Orders

## 2019-05-08 NOTE — Patient Instructions (Signed)
MRI brain Blood work Start [employer] injection What is this medicine? FREMANEZUMAB (fre ma NEZ ue mab) is used to prevent migraine headaches. This medicine may be used for other purposes; ask your health care provider or pharmacist if you have questions. COMMON BRAND NAME(S): AJOVY What should I tell my health care provider before I take this medicine? They need to know if you have any of these conditions:  an unusual or allergic reaction to fremanezumab, other medicines, foods, dyes, or preservatives  pregnant or trying to get pregnant  breast-feeding How should I use this medicine? This medicine is for injection under the skin. You will be taught how to prepare and give this medicine. Use exactly as directed. Take your medicine at regular intervals. Do not take your medicine more often than directed. It is important that you put your used needles and syringes in a special sharps container. Do not put them in a trash can. If you do not have a sharps container, call your pharmacist or healthcare provider to get one. Talk to your pediatrician regarding the use of this medicine in children. Special care may be needed. Overdosage: If you think you have taken too much of this medicine contact a poison control center or emergency room at once. NOTE: This medicine is only for you. Do not share this medicine with others. What if I miss a dose? If you miss a dose, take it as soon as you can. If it is almost time for your next dose, take only that dose. Do not take double or extra doses. What may interact with this medicine? Interactions are not expected. This list may not describe all possible interactions. Give your health care provider a list of all the medicines, herbs, non-prescription drugs, or dietary supplements you use. Also tell them if you smoke, drink alcohol, or use illegal drugs. Some items may interact with your medicine. What should I watch for while using this  medicine? Tell your doctor or healthcare professional if your symptoms do not start to get better or if they get worse. What side effects may I notice from receiving this medicine? Side effects that you should report to your doctor or health care professional as soon as possible:  allergic reactions like skin rash, itching or hives, swelling of the face, lips, or tongue Side effects that usually do not require medical attention (report these to your doctor or health care professional if they continue or are bothersome):  pain, redness, or irritation at site where injected This list may not describe all possible side effects. Call your doctor for medical advice about side effects. You may report side effects to FDA at 1-800-FDA-1088. Where should I keep my medicine? Keep out of the reach of children. You will be instructed on how to store this medicine. Throw away any unused medicine after the expiration date on the label. NOTE: This sheet is a summary. It may not cover all possible information. If you have questions about this medicine, talk to your doctor, pharmacist, or health care provider.  2020 Elsevier/Gold Standard (2016-10-26 17:22:56)

## 2019-05-09 ENCOUNTER — Telehealth: Payer: Self-pay | Admitting: Neurology

## 2019-05-09 LAB — BASIC METABOLIC PANEL
BUN/Creatinine Ratio: 12 (ref 9–23)
BUN: 11 mg/dL (ref 6–24)
CO2: 25 mmol/L (ref 20–29)
Calcium: 10 mg/dL (ref 8.7–10.2)
Chloride: 105 mmol/L (ref 96–106)
Creatinine, Ser: 0.93 mg/dL (ref 0.57–1.00)
GFR calc Af Amer: 80 mL/min/{1.73_m2} (ref >59)
GFR calc non Af Amer: 69 mL/min/{1.73_m2} (ref >59)
Glucose: 94 mg/dL (ref 65–99)
Potassium: 4.3 mmol/L (ref 3.5–5.2)
Sodium: 143 mmol/L (ref 134–144)

## 2019-05-09 NOTE — Telephone Encounter (Signed)
[employer] medicare order sent to GI. They will obtain the auth and reach out to the patient to schedule.  °

## 2019-05-11 ENCOUNTER — Telehealth: Payer: Self-pay | Admitting: *Deleted

## 2019-05-11 NOTE — Telephone Encounter (Signed)
Ajovy PA in progress on CMM. KEY: [id number]. Will update when sent to plan.

## 2019-05-18 NOTE — Telephone Encounter (Signed)
Ajovy PA completed and sent to plan. Key: [id number] - PA Case ID: [id number]. Awaiting determination from [employer].

## 2019-05-18 NOTE — Telephone Encounter (Signed)
Per Cover My Meds, Tenna Child has approved the Ajovy.   This approval authorizes your coverage from 02/10/2019 - 02/09/2020.

## 2019-05-22 NOTE — Telephone Encounter (Signed)
I faxed the approval letter to pt's pharmacy. Received a receipt of confirmation.

## 2019-05-23 ENCOUNTER — Encounter: Payer: Self-pay | Admitting: Family Medicine

## 2019-05-23 ENCOUNTER — Ambulatory Visit (INDEPENDENT_AMBULATORY_CARE_PROVIDER_SITE_OTHER): Payer: Medicare HMO | Admitting: Family Medicine

## 2019-05-23 ENCOUNTER — Other Ambulatory Visit: Payer: Self-pay

## 2019-05-23 VITALS — BP 148/96 | Temp 98.0°F | Wt 209.0 lb

## 2019-05-23 DIAGNOSIS — R040 Epistaxis: Secondary | ICD-10-CM

## 2019-05-23 DIAGNOSIS — E785 Hyperlipidemia, unspecified: Secondary | ICD-10-CM

## 2019-05-23 DIAGNOSIS — G43019 Migraine without aura, intractable, without status migrainosus: Secondary | ICD-10-CM | POA: Diagnosis not present

## 2019-05-23 DIAGNOSIS — E119 Type 2 diabetes mellitus without complications: Secondary | ICD-10-CM | POA: Diagnosis not present

## 2019-05-23 DIAGNOSIS — I10 Essential (primary) hypertension: Secondary | ICD-10-CM | POA: Diagnosis not present

## 2019-05-23 DIAGNOSIS — Z79899 Other long term (current) drug therapy: Secondary | ICD-10-CM

## 2019-05-23 DIAGNOSIS — M1711 Unilateral primary osteoarthritis, right knee: Secondary | ICD-10-CM | POA: Diagnosis not present

## 2019-05-23 DIAGNOSIS — J301 Allergic rhinitis due to pollen: Secondary | ICD-10-CM | POA: Diagnosis not present

## 2019-05-23 MED ORDER — AMLODIPINE BESYLATE 2.5 MG PO TABS: 2.5000 mg | ORAL_TABLET | Freq: Every day | ORAL | 3 refills | Status: DC

## 2019-05-23 MED ORDER — FLUTICASONE PROPIONATE 50 MCG/ACT NA SUSP: 2.0000 | Freq: Every day | NASAL | 5 refills | Status: DC | PRN

## 2019-05-23 NOTE — Progress Notes (Signed)
Subjective:    Patient ID: [name], female    DOB: 07-17-1965, 54 y.o.   MRN: [id number]  Diabetes She presents for her follow-up diabetic visit. She has type 2 diabetes mellitus. Pertinent negatives for hypoglycemia include no confusion or dizziness. Pertinent negatives for diabetes include no chest pain, no fatigue, no polydipsia, no polyphagia and no weakness.  Patient states she checks her sugar 1x per day and they usually stay between 90-140.  Patient would like to discuss recent nose bleeds.  Patient considering a knee replacement.  Questions regarding tramadol after starting Ajovy.   Review of Systems  Constitutional: Negative for activity change, appetite change and fatigue.  HENT: Positive for nosebleeds. Negative for congestion and rhinorrhea.   Respiratory: Negative for cough and shortness of breath.   Cardiovascular: Negative for chest pain and leg swelling.  Gastrointestinal: Negative for abdominal pain and diarrhea.  Endocrine: Negative for polydipsia and polyphagia.  Skin: Negative for color change.  Neurological: Negative for dizziness and weakness.  Psychiatric/Behavioral: Negative for behavioral problems and confusion.       Objective:   Physical Exam Vitals reviewed.  Constitutional:      General: She is not in acute distress. HENT:     Head: Normocephalic and atraumatic.  Eyes:     General:        Right eye: No discharge.        Left eye: No discharge.  Neck:     Trachea: No tracheal deviation.  Cardiovascular:     Rate and Rhythm: Normal rate and regular rhythm.     Heart sounds: Normal heart sounds. No murmur.  Pulmonary:     Effort: Pulmonary effort is normal. No respiratory distress.     Breath sounds: Normal breath sounds.  Lymphadenopathy:     Cervical: No cervical adenopathy.  Skin:    General: Skin is warm and dry.  Neurological:     Mental Status: She is alert.     Coordination: Coordination normal.  Psychiatric:        Behavior: Behavior normal.           Assessment & Plan:  1. Essential hypertension Blood pressure under decent control in the past but today is higher than what I like to see it therefore add amlodipine 2.5 mg daily - Basic metabolic panel  2. Intractable migraine without aura and without status migrainosus Patient still having headaches every day some of this could be related to blood pressure patient has started a new injectable.  She wonders if she can take any medications to help her with her headache.  We will ask neurology to reach out to her regarding what she can do for her headaches currently.  Patient will follow up blood pressure again in 2 to 3 weeks time  3. Type 2 diabetes mellitus without complication, without long-term current use of insulin ([place]) Diabetes we will check lab work hopefully under decent control she feels her sugars been doing fairly well - CBC with Differential/Platelet - Hemoglobin A1c - Microalbumin / creatinine urine ratio  4. Hyperlipidemia LDL goal <70 Watching diet try to minimize fats in the diet continue medication check lab work - Lipid panel - CBC with Differential/Platelet  5. Seasonal allergic rhinitis due to pollen May use Flonase may use generic Allegra if ongoing troubles or worsening issues follow-up  6. Epistaxis Intermittent nosebleeds due to ROM nasal septum on the right side proper way to stop a nosebleed was shown plus also  tips on what to do to minimize nosebleeds  7. Primary osteoarthritis of right knee Significant osteoarthritis of right knee patient contemplating knee replacement which I think is a reasonable step for her she is medically fit to be able to do's surgery.  8. High risk medication use Labs ordered - Hepatic function panel - CBC with Differential/Platelet - Microalbumin / creatinine urine ratio 30 minutes spent with patient between reviewing notes reviewing specialist notes previous labs talking with the  patient covering her medications rechecking her blood pressure and discussing knee replacement surgery and documenting

## 2019-05-23 NOTE — Patient Instructions (Addendum)
Use OTC Allegra Equate version  Fexofenadine 180mg   For nose bleeds pinch nose when bleeds for 5 minutes Also saline nasal spray several times a day also vaseline thin layer at night   Diabetes Mellitus and Nutrition, Adult When you have diabetes (diabetes mellitus), it is very important to have healthy eating habits because your blood sugar (glucose) levels are greatly affected by what you eat and drink. Eating healthy foods in the appropriate amounts, at about the same times every day, can help you:  Control your blood glucose.  Lower your risk of heart disease.  Improve your blood pressure.  Reach or maintain a healthy weight. Every person with diabetes is different, and each person has different needs for a meal plan. Your health care provider may recommend that you work with a diet and nutrition specialist (dietitian) to make a meal plan that is best for you. Your meal plan may vary depending on factors such as:  The calories you need.  The medicines you take.  Your weight.  Your blood glucose, blood pressure, and cholesterol levels.  Your activity level.  Other health conditions you have, such as heart or kidney disease. How do carbohydrates affect me? Carbohydrates, also called carbs, affect your blood glucose level more than any other type of food. Eating carbs naturally raises the amount of glucose in your blood. Carb counting is a method for keeping track of how many carbs you eat. Counting carbs is important to keep your blood glucose at a healthy level, especially if you use insulin or take certain oral diabetes medicines. It is important to know how many carbs you can safely have in each meal. This is different for every person. Your dietitian can help you calculate how many carbs you should have at each meal and for each snack. Foods that contain carbs include:  Bread, cereal, rice, pasta, and crackers.  Potatoes and corn.  Peas, beans, and lentils.  Milk and  yogurt.  Fruit and juice.  Desserts, such as cakes, cookies, ice cream, and candy. How does alcohol affect me? Alcohol can cause a sudden decrease in blood glucose (hypoglycemia), especially if you use insulin or take certain oral diabetes medicines. Hypoglycemia can be a life-threatening condition. Symptoms of hypoglycemia (sleepiness, dizziness, and confusion) are similar to symptoms of having too much alcohol. If your health care provider says that alcohol is safe for you, follow these guidelines:  Limit alcohol intake to no more than 1 drink per day for nonpregnant women and 2 drinks per day for men. One drink equals 12 oz of beer, 5 oz of wine, or 1 oz of hard liquor.  Do not drink on an empty stomach.  Keep yourself hydrated with water, diet soda, or unsweetened iced tea.  Keep in mind that regular soda, juice, and other mixers may contain a lot of sugar and must be counted as carbs. What are tips for following this plan?  Reading food labels  Start by checking the serving size on the "Nutrition Facts" label of packaged foods and drinks. The amount of calories, carbs, fats, and other nutrients listed on the label is based on one serving of the item. Many items contain more than one serving per package.  Check the total grams (g) of carbs in one serving. You can calculate the number of servings of carbs in one serving by dividing the total carbs by 15. For example, if a food has 30 g of total carbs, it would be equal to  2 servings of carbs.  Check the number of grams (g) of saturated and trans fats in one serving. Choose foods that have low or no amount of these fats.  Check the number of milligrams (mg) of salt (sodium) in one serving. Most people should limit total sodium intake to less than 2,300 mg per day.  Always check the nutrition information of foods labeled as "low-fat" or "nonfat". These foods may be higher in added sugar or refined carbs and should be avoided.  Talk to  your dietitian to identify your daily goals for nutrients listed on the label. Shopping  Avoid buying canned, premade, or processed foods. These foods tend to be high in fat, sodium, and added sugar.  Shop around the outside edge of the grocery store. This includes fresh fruits and vegetables, bulk grains, fresh meats, and fresh dairy. Cooking  Use low-heat cooking methods, such as baking, instead of high-heat cooking methods like deep frying.  Cook using healthy oils, such as olive, canola, or sunflower oil.  Avoid cooking with butter, cream, or high-fat meats. Meal planning  Eat meals and snacks regularly, preferably at the same times every day. Avoid going long periods of time without eating.  Eat foods high in fiber, such as fresh fruits, vegetables, beans, and whole grains. Talk to your dietitian about how many servings of carbs you can eat at each meal.  Eat 4-6 ounces (oz) of lean protein each day, such as lean meat, chicken, fish, eggs, or tofu. One oz of lean protein is equal to: ? 1 oz of meat, chicken, or fish. ? 1 egg. ?  cup of tofu.  Eat some foods each day that contain healthy fats, such as avocado, nuts, seeds, and fish. Lifestyle  Check your blood glucose regularly.  Exercise regularly as told by your health care provider. This may include: ? 150 minutes of moderate-intensity or vigorous-intensity exercise each week. This could be brisk walking, biking, or water aerobics. ? Stretching and doing strength exercises, such as yoga or weightlifting, at least 2 times a week.  Take medicines as told by your health care provider.  Do not use any products that contain nicotine or tobacco, such as cigarettes and e-cigarettes. If you need help quitting, ask your health care provider.  Work with a Veterinary surgeon or diabetes educator to identify strategies to manage stress and any emotional and social challenges. Questions to ask a health care provider  Do I need to meet with  a diabetes educator?  Do I need to meet with a dietitian?  What number can I call if I have questions?  When are the best times to check my blood glucose? Where to find more information:  American Diabetes Association: diabetes.org  Academy of Nutrition and Dietetics: www.eatright.[employer] of Diabetes and Digestive and Kidney Diseases (NIH): CarFlippers.tn Summary  A healthy meal plan will help you control your blood glucose and maintain a healthy lifestyle.  Working with a diet and nutrition specialist (dietitian) can help you make a meal plan that is best for you.  Keep in mind that carbohydrates (carbs) and alcohol have immediate effects on your blood glucose levels. It is important to count carbs and to use alcohol carefully. This information is not intended to replace advice given to you by your health care provider. Make sure you discuss any questions you have with your health care provider. Document Revised: 01/08/2017 Document Reviewed: 03/02/2016 Elsevier Patient Education  2020 Elsevier Inc.  DASH Eating Plan  DASH stands for "Dietary Approaches to Stop Hypertension." The DASH eating plan is a healthy eating plan that has been shown to reduce high blood pressure (hypertension). It may also reduce your risk for type 2 diabetes, heart disease, and stroke. The DASH eating plan may also help with weight loss. What are tips for following this plan?  General guidelines  Avoid eating more than 2,300 mg (milligrams) of salt (sodium) a day. If you have hypertension, you may need to reduce your sodium intake to 1,500 mg a day.  Limit alcohol intake to no more than 1 drink a day for nonpregnant women and 2 drinks a day for men. One drink equals 12 oz of beer, 5 oz of wine, or 1 oz of hard liquor.  Work with your health care provider to maintain a healthy body weight or to lose weight. Ask what an ideal weight is for you.  Get at least 30 minutes of exercise that  causes your heart to beat faster (aerobic exercise) most days of the week. Activities may include walking, swimming, or biking.  Work with your health care provider or diet and nutrition specialist (dietitian) to adjust your eating plan to your individual calorie needs. Reading food labels   Check food labels for the amount of sodium per serving. Choose foods with less than 5 percent of the Daily Value of sodium. Generally, foods with less than 300 mg of sodium per serving fit into this eating plan.  To find whole grains, look for the word "whole" as the first word in the ingredient list. Shopping  Buy products labeled as "low-sodium" or "no salt added."  Buy fresh foods. Avoid canned foods and premade or frozen meals. Cooking  Avoid adding salt when cooking. Use salt-free seasonings or herbs instead of table salt or sea salt. Check with your health care provider or pharmacist before using salt substitutes.  Do not fry foods. Cook foods using healthy methods such as baking, boiling, grilling, and broiling instead.  Cook with heart-healthy oils, such as olive, canola, soybean, or sunflower oil. Meal planning  Eat a balanced diet that includes: ? 5 or more servings of fruits and vegetables each day. At each meal, try to fill half of your plate with fruits and vegetables. ? Up to 6-8 servings of whole grains each day. ? Less than 6 oz of lean meat, poultry, or fish each day. A 3-oz serving of meat is about the same size as a deck of cards. One egg equals 1 oz. ? 2 servings of low-fat dairy each day. ? A serving of nuts, seeds, or beans 5 times each week. ? Heart-healthy fats. Healthy fats called Omega-3 fatty acids are found in foods such as flaxseeds and coldwater fish, like sardines, salmon, and mackerel.  Limit how much you eat of the following: ? Canned or prepackaged foods. ? Food that is high in trans fat, such as fried foods. ? Food that is high in saturated fat, such as fatty  meat. ? Sweets, desserts, sugary drinks, and other foods with added sugar. ? Full-fat dairy products.  Do not salt foods before eating.  Try to eat at least 2 vegetarian meals each week.  Eat more home-cooked food and less restaurant, buffet, and fast food.  When eating at a restaurant, ask that your food be prepared with less salt or no salt, if possible. What foods are recommended? The items listed may not be a complete list. Talk with your dietitian about what dietary  choices are best for you. Grains Whole-grain or whole-wheat bread. Whole-grain or whole-wheat pasta. Brown rice. Orpah Cobb. Bulgur. Whole-grain and low-sodium cereals. Pita bread. Low-fat, low-sodium crackers. Whole-wheat flour tortillas. Vegetables Fresh or frozen vegetables (raw, steamed, roasted, or grilled). Low-sodium or reduced-sodium tomato and vegetable juice. Low-sodium or reduced-sodium tomato sauce and tomato paste. Low-sodium or reduced-sodium canned vegetables. Fruits All fresh, dried, or frozen fruit. Canned fruit in natural juice (without added sugar). Meat and other protein foods Skinless chicken or [place]. Ground chicken or [place]. Pork with fat trimmed off. Fish and seafood. Egg whites. Dried beans, peas, or lentils. Unsalted nuts, nut butters, and seeds. Unsalted canned beans. Lean cuts of beef with fat trimmed off. Low-sodium, lean deli meat. Dairy Low-fat (1%) or fat-free (skim) milk. Fat-free, low-fat, or reduced-fat cheeses. Nonfat, low-sodium ricotta or cottage cheese. Low-fat or nonfat yogurt. Low-fat, low-sodium cheese. Fats and oils Soft margarine without trans fats. Vegetable oil. Low-fat, reduced-fat, or light mayonnaise and salad dressings (reduced-sodium). Canola, safflower, olive, soybean, and sunflower oils. Avocado. Seasoning and other foods Herbs. Spices. Seasoning mixes without salt. Unsalted popcorn and pretzels. Fat-free sweets. What foods are not recommended? The items listed  may not be a complete list. Talk with your dietitian about what dietary choices are best for you. Grains Baked goods made with fat, such as croissants, muffins, or some breads. Dry pasta or rice meal packs. Vegetables Creamed or fried vegetables. Vegetables in a cheese sauce. Regular canned vegetables (not low-sodium or reduced-sodium). Regular canned tomato sauce and paste (not low-sodium or reduced-sodium). Regular tomato and vegetable juice (not low-sodium or reduced-sodium). Rosita Fire. Olives. Fruits Canned fruit in a light or heavy syrup. Fried fruit. Fruit in cream or butter sauce. Meat and other protein foods Fatty cuts of meat. Ribs. Fried meat. Tomasa Blase. Sausage. Bologna and other processed lunch meats. Salami. Fatback. Hotdogs. Bratwurst. Salted nuts and seeds. Canned beans with added salt. Canned or smoked fish. Whole eggs or egg yolks. Chicken or [place] with skin. Dairy Whole or 2% milk, cream, and half-and-half. Whole or full-fat cream cheese. Whole-fat or sweetened yogurt. Full-fat cheese. Nondairy creamers. Whipped toppings. Processed cheese and cheese spreads. Fats and oils Butter. Stick margarine. Lard. Shortening. Ghee. Bacon fat. Tropical oils, such as coconut, palm kernel, or palm oil. Seasoning and other foods Salted popcorn and pretzels. Onion salt, garlic salt, seasoned salt, table salt, and sea salt. Worcestershire sauce. Tartar sauce. Barbecue sauce. Teriyaki sauce. Soy sauce, including reduced-sodium. Steak sauce. Canned and packaged gravies. Fish sauce. Oyster sauce. Cocktail sauce. Horseradish that you find on the shelf. Ketchup. Mustard. Meat flavorings and tenderizers. Bouillon cubes. Hot sauce and Tabasco sauce. Premade or packaged marinades. Premade or packaged taco seasonings. Relishes. Regular salad dressings. Where to find more information:  National Heart, Lung, and Blood Institute: PopSteam.is  American Heart Association: www.heart.org Summary  The DASH  eating plan is a healthy eating plan that has been shown to reduce high blood pressure (hypertension). It may also reduce your risk for type 2 diabetes, heart disease, and stroke.  With the DASH eating plan, you should limit salt (sodium) intake to 2,300 mg a day. If you have hypertension, you may need to reduce your sodium intake to 1,500 mg a day.  When on the DASH eating plan, aim to eat more fresh fruits and vegetables, whole grains, lean proteins, low-fat dairy, and heart-healthy fats.  Work with your health care provider or diet and nutrition specialist (dietitian) to adjust your eating plan to your individual calorie  needs. This information is not intended to replace advice given to you by your health care provider. Make sure you discuss any questions you have with your health care provider. Document Revised: 01/08/2017 Document Reviewed: 01/20/2016 Elsevier Patient Education  2020 [employer].

## 2019-05-26 DIAGNOSIS — E119 Type 2 diabetes mellitus without complications: Secondary | ICD-10-CM | POA: Diagnosis not present

## 2019-05-26 DIAGNOSIS — E785 Hyperlipidemia, unspecified: Secondary | ICD-10-CM | POA: Diagnosis not present

## 2019-05-26 DIAGNOSIS — I10 Essential (primary) hypertension: Secondary | ICD-10-CM | POA: Diagnosis not present

## 2019-05-26 DIAGNOSIS — Z79899 Other long term (current) drug therapy: Secondary | ICD-10-CM | POA: Diagnosis not present

## 2019-05-27 LAB — HEMOGLOBIN A1C
Est. average glucose Bld gHb Est-mCnc: 128 mg/dL
Hgb A1c MFr Bld: 6.1 % — ABNORMAL HIGH (ref 4.8–5.6)

## 2019-05-27 LAB — CBC WITH DIFFERENTIAL/PLATELET
Basophils Absolute: 0 10*3/uL (ref 0.0–0.2)
Basos: 0 %
EOS (ABSOLUTE): 0.2 10*3/uL (ref 0.0–0.4)
Eos: 3 %
Hematocrit: 38 % (ref 34.0–46.6)
Hemoglobin: 12.6 g/dL (ref 11.1–15.9)
Immature Grans (Abs): 0 10*3/uL (ref 0.0–0.1)
Immature Granulocytes: 0 %
Lymphocytes Absolute: 2.1 10*3/uL (ref 0.7–3.1)
Lymphs: 33 %
MCH: 30.3 pg (ref 26.6–33.0)
MCHC: 33.2 g/dL (ref 31.5–35.7)
MCV: 91 fL (ref 79–97)
Monocytes Absolute: 0.6 10*3/uL (ref 0.1–0.9)
Monocytes: 9 %
Neutrophils Absolute: 3.6 10*3/uL (ref 1.4–7.0)
Neutrophils: 55 %
Platelets: 278 10*3/uL (ref 150–450)
RBC: 4.16 x10E6/uL (ref 3.77–5.28)
RDW: 12.9 % (ref 11.7–15.4)
WBC: 6.4 10*3/uL (ref 3.4–10.8)

## 2019-05-27 LAB — BASIC METABOLIC PANEL
BUN/Creatinine Ratio: 20 (ref 9–23)
BUN: 16 mg/dL (ref 6–24)
CO2: 25 mmol/L (ref 20–29)
Calcium: 10.1 mg/dL (ref 8.7–10.2)
Chloride: 103 mmol/L (ref 96–106)
Creatinine, Ser: 0.79 mg/dL (ref 0.57–1.00)
GFR calc Af Amer: 97 mL/min/{1.73_m2} (ref >59)
GFR calc non Af Amer: 85 mL/min/{1.73_m2} (ref >59)
Glucose: 107 mg/dL — ABNORMAL HIGH (ref 65–99)
Potassium: 4.1 mmol/L (ref 3.5–5.2)
Sodium: 140 mmol/L (ref 134–144)

## 2019-05-27 LAB — MICROALBUMIN / CREATININE URINE RATIO
Creatinine, Urine: 87.8 mg/dL
Microalb/Creat Ratio: 5 mg/g creat (ref 0–29)
Microalbumin, Urine: 4 ug/mL

## 2019-05-27 LAB — LIPID PANEL
Chol/HDL Ratio: 3.9 ratio (ref 0.0–4.4)
Cholesterol, Total: 136 mg/dL (ref 100–199)
HDL: 35 mg/dL — ABNORMAL LOW (ref >39)
LDL Chol Calc (NIH): 75 mg/dL (ref 0–99)
Triglycerides: 148 mg/dL (ref 0–149)
VLDL Cholesterol Cal: 26 mg/dL (ref 5–40)

## 2019-05-27 LAB — HEPATIC FUNCTION PANEL
ALT: 11 IU/L (ref 0–32)
AST: 13 IU/L (ref 0–40)
Albumin: 4.3 g/dL (ref 3.8–4.9)
Alkaline Phosphatase: 71 IU/L (ref 39–117)
Bilirubin Total: 0.4 mg/dL (ref 0.0–1.2)
Bilirubin, Direct: 0.14 mg/dL (ref 0.00–0.40)
Total Protein: 7.4 g/dL (ref 6.0–8.5)

## 2019-05-30 NOTE — Telephone Encounter (Signed)
[employer] medicare Berkley Harvey: [id number] (exp. 05/29/19 to 11/25/19) patient is scheduled at [facility] for 06/07/19.

## 2019-06-01 ENCOUNTER — Other Ambulatory Visit: Payer: Self-pay | Admitting: Family Medicine

## 2019-06-01 DIAGNOSIS — R69 Illness, unspecified: Secondary | ICD-10-CM | POA: Diagnosis not present

## 2019-06-07 ENCOUNTER — Other Ambulatory Visit: Payer: Self-pay

## 2019-06-07 ENCOUNTER — Ambulatory Visit
Admission: RE | Admit: 2019-06-07 | Discharge: 2019-06-07 | Disposition: A | Discharge status: Home / Self Care | Payer: Medicare HMO | Source: Ambulatory Visit | Attending: Neurology | Admitting: Neurology

## 2019-06-07 DIAGNOSIS — H538 Other visual disturbances: Secondary | ICD-10-CM

## 2019-06-07 DIAGNOSIS — R51 Headache with orthostatic component, not elsewhere classified: Secondary | ICD-10-CM

## 2019-06-07 DIAGNOSIS — G441 Vascular headache, not elsewhere classified: Secondary | ICD-10-CM

## 2019-06-07 DIAGNOSIS — R519 Headache, unspecified: Secondary | ICD-10-CM

## 2019-06-07 MED ORDER — GADOBENATE DIMEGLUMINE 529 MG/ML IV SOLN
20.0000 mL | Freq: Once | INTRAVENOUS | Status: AC | PRN
  Administered 2019-06-07: 20 mL via INTRAVENOUS

## 2019-06-14 ENCOUNTER — Ambulatory Visit (INDEPENDENT_AMBULATORY_CARE_PROVIDER_SITE_OTHER): Payer: Medicare HMO | Admitting: Family Medicine

## 2019-06-14 ENCOUNTER — Other Ambulatory Visit: Payer: Self-pay

## 2019-06-14 ENCOUNTER — Encounter: Payer: Self-pay | Admitting: Family Medicine

## 2019-06-14 VITALS — BP 132/88 | Temp 97.8°F | Ht 65.0 in | Wt 209.0 lb

## 2019-06-14 DIAGNOSIS — I10 Essential (primary) hypertension: Secondary | ICD-10-CM

## 2019-06-14 MED ORDER — METOPROLOL TARTRATE 50 MG PO TABS: 50.0000 mg | ORAL_TABLET | Freq: Two times a day (BID) | ORAL | 1 refills | Status: DC

## 2019-06-14 MED ORDER — TRIAMTERENE-HCTZ 37.5-25 MG PO TABS: 1.0000 | ORAL_TABLET | Freq: Every day | ORAL | 1 refills | Status: DC

## 2019-06-14 MED ORDER — AMLODIPINE BESYLATE 5 MG PO TABS: 5.0000 mg | ORAL_TABLET | Freq: Every day | ORAL | 1 refills | Status: DC

## 2019-06-14 MED ORDER — ROSUVASTATIN CALCIUM 5 MG PO TABS: ORAL_TABLET | ORAL | 1 refills | Status: DC

## 2019-06-14 NOTE — Progress Notes (Signed)
Subjective:    Patient ID: [name], female    DOB: 03-10-1965, 54 y.o.   MRN: [id number]  Hypertension This is a chronic problem. Pertinent negatives include no chest pain or shortness of breath. There are no compliance problems (takes med every day, tries to do a little walking, eats healthy with diet).    Dizziness and feeling off balanced for about one month.  Patient states at times she just feels little unsteady feels slightly dizzy denies unilateral numbness or weakness Results for orders placed or performed in visit on 05/23/19  Basic metabolic panel  Result Value Ref Range   Glucose 107 (H) 65 - 99 mg/dL   BUN 16 6 - 24 mg/dL   Creatinine, Ser 4.43 0.57 - 1.00 mg/dL   GFR calc non Af Amer 85 >59 mL/min/1.73   GFR calc Af Amer 97 >59 mL/min/1.73   BUN/Creatinine Ratio 20 9 - 23   Sodium 140 134 - 144 mmol/L   Potassium 4.1 3.5 - 5.2 mmol/L   Chloride 103 96 - 106 mmol/L   CO2 25 20 - 29 mmol/L   Calcium 10.1 8.7 - 10.2 mg/dL  Hepatic function panel  Result Value Ref Range   Total Protein 7.4 6.0 - 8.5 g/dL   Albumin 4.3 3.8 - 4.9 g/dL   Bilirubin Total 0.4 0.0 - 1.2 mg/dL   Bilirubin, Direct 1.54 0.00 - 0.40 mg/dL   Alkaline Phosphatase 71 39 - 117 IU/L   AST 13 0 - 40 IU/L   ALT 11 0 - 32 IU/L  Lipid panel  Result Value Ref Range   Cholesterol, Total 136 100 - 199 mg/dL   Triglycerides 008 0 - 149 mg/dL   HDL 35 (L) >67 mg/dL   VLDL Cholesterol Cal 26 5 - 40 mg/dL   LDL Chol Calc (NIH) 75 0 - 99 mg/dL   Chol/HDL Ratio 3.9 0.0 - 4.4 ratio  CBC with Differential/Platelet  Result Value Ref Range   WBC 6.4 3.4 - 10.8 x10E3/uL   RBC 4.16 3.77 - 5.28 x10E6/uL   Hemoglobin 12.6 11.1 - 15.9 g/dL   Hematocrit 61.9 50.9 - 46.6 %   MCV 91 79 - 97 fL   MCH 30.3 26.6 - 33.0 pg   MCHC 33.2 31.5 - 35.7 g/dL   RDW 32.6 71.2 - 45.8 %   Platelets 278 150 - 450 x10E3/uL   Neutrophils 55 Not Estab. %   Lymphs 33 Not Estab. %   Monocytes 9 Not Estab. %   Eos 3  Not Estab. %   Basos 0 Not Estab. %   Neutrophils Absolute 3.6 1.4 - 7.0 x10E3/uL   Lymphocytes Absolute 2.1 0.7 - 3.1 x10E3/uL   Monocytes Absolute 0.6 0.1 - 0.9 x10E3/uL   EOS (ABSOLUTE) 0.2 0.0 - 0.4 x10E3/uL   Basophils Absolute 0.0 0.0 - 0.2 x10E3/uL   Immature Granulocytes 0 Not Estab. %   Immature Grans (Abs) 0.0 0.0 - 0.1 x10E3/uL  Hemoglobin A1c  Result Value Ref Range   Hgb A1c MFr Bld 6.1 (H) 4.8 - 5.6 %   Est. average glucose Bld gHb Est-mCnc 128 mg/dL  Microalbumin / creatinine urine ratio  Result Value Ref Range   Creatinine, Urine 87.8 Not Estab. mg/dL   Microalbumin, Urine 4.0 Not Estab. ug/mL   Microalb/Creat Ratio 5 0 - 29 mg/g creat     Review of Systems  Constitutional: Negative for activity change, appetite change and fatigue.  HENT: Negative for congestion  and rhinorrhea.   Respiratory: Negative for cough and shortness of breath.   Cardiovascular: Negative for chest pain and leg swelling.  Gastrointestinal: Negative for abdominal pain and diarrhea.  Endocrine: Negative for polydipsia and polyphagia.  Skin: Negative for color change.  Neurological: Negative for dizziness and weakness.  Psychiatric/Behavioral: Negative for behavioral problems and confusion.       Objective:   Physical Exam Vitals reviewed.  Constitutional:      General: She is not in acute distress. HENT:     Head: Normocephalic and atraumatic.  Eyes:     General:        Right eye: No discharge.        Left eye: No discharge.  Neck:     Trachea: No tracheal deviation.  Cardiovascular:     Rate and Rhythm: Normal rate and regular rhythm.     Heart sounds: Normal heart sounds. No murmur.  Pulmonary:     Effort: Pulmonary effort is normal. No respiratory distress.     Breath sounds: Normal breath sounds.  Lymphadenopathy:     Cervical: No cervical adenopathy.  Skin:    General: Skin is warm and dry.  Neurological:     Mental Status: She is alert.     Coordination:  Coordination normal.  Psychiatric:        Behavior: Behavior normal.           Assessment & Plan:  Blood pressure was checked sitting standing no appreciable change Blood pressure not quite as good as I like to see the nurse got a good reading my reading was slightly elevated 144/94 Increase amlodipine 5 mg daily Patient to follow-up in 6 weeks Her headaches are starting to do somewhat better with neurology treatments

## 2019-06-23 ENCOUNTER — Other Ambulatory Visit: Payer: Self-pay | Admitting: Family Medicine

## 2019-07-06 ENCOUNTER — Telehealth: Payer: Self-pay | Admitting: *Deleted

## 2019-07-06 NOTE — Telephone Encounter (Signed)
Fax from [place]. Duloxetine is approved 02/10/19 -02/09/20. Called [facility] and notified them on prescriber voicemail and also tried to call pt to notify and no answer.

## 2019-07-06 NOTE — Telephone Encounter (Signed)
Pt.notified

## 2019-08-09 ENCOUNTER — Other Ambulatory Visit: Payer: Self-pay

## 2019-08-09 ENCOUNTER — Ambulatory Visit (INDEPENDENT_AMBULATORY_CARE_PROVIDER_SITE_OTHER): Payer: Medicare HMO | Admitting: Family Medicine

## 2019-08-09 ENCOUNTER — Encounter: Payer: Self-pay | Admitting: Family Medicine

## 2019-08-09 VITALS — BP 120/64 | Temp 97.7°F | Ht 65.0 in | Wt 213.8 lb

## 2019-08-09 DIAGNOSIS — G43019 Migraine without aura, intractable, without status migrainosus: Secondary | ICD-10-CM

## 2019-08-09 DIAGNOSIS — I10 Essential (primary) hypertension: Secondary | ICD-10-CM

## 2019-08-09 MED ORDER — BUPROPION HCL ER (SR) 150 MG PO TB12
150.0000 mg | ORAL_TABLET | Freq: Two times a day (BID) | ORAL | 2 refills | Status: DC
Start: 2019-08-09 — End: 2020-01-02

## 2019-08-09 NOTE — Progress Notes (Signed)
   Subjective:    Patient ID: [name], female    DOB: 1965/04/16, 54 y.o.   MRN: [id number]  Hypertension This is a chronic problem. There are no compliance problems (takes meds every day, eats healthy, a little exercise).    Needs refill on famotidine and flovent.  Would like to discuss stress and depression.   Discuss seeing spots and a film across eyes. Started a couple of months ago.     Review of Systems Denies any chest tightness pressure pain shortness of breath    Objective:   Physical Exam  Lungs clear heart regular HEENT benign Patient is having significant depression issues but working through things as best she can We will add Wellbutrin twice daily have the patient follow-up within 3 to 4 months. Have the patient give [place] a MyChart update within 3 to 4 weeks patient not suicidal    Assessment & Plan:  Headaches under good control continue current measures Blood pressure good control continue current measures Refills of medication given Follow-up if any ongoing troubles See eye specialist on a yearly basis  Follow-up within 3 months

## 2019-08-14 MED ORDER — FAMOTIDINE 20 MG PO TABS: 20.0000 mg | ORAL_TABLET | Freq: Two times a day (BID) | ORAL | 5 refills | Status: DC

## 2019-08-14 MED ORDER — FLOVENT HFA 110 MCG/ACT IN AERO: 2.0000 | INHALATION_SPRAY | Freq: Two times a day (BID) | RESPIRATORY_TRACT | 5 refills | Status: DC

## 2019-10-04 ENCOUNTER — Other Ambulatory Visit: Payer: Self-pay

## 2019-10-04 ENCOUNTER — Ambulatory Visit (INDEPENDENT_AMBULATORY_CARE_PROVIDER_SITE_OTHER): Payer: Medicare HMO | Admitting: Family Medicine

## 2019-10-04 ENCOUNTER — Encounter: Payer: Self-pay | Admitting: Family Medicine

## 2019-10-04 VITALS — BP 120/64 | HR 76 | Temp 97.7°F | Ht 65.0 in | Wt 216.0 lb

## 2019-10-04 DIAGNOSIS — E119 Type 2 diabetes mellitus without complications: Secondary | ICD-10-CM

## 2019-10-04 DIAGNOSIS — R69 Illness, unspecified: Secondary | ICD-10-CM | POA: Diagnosis not present

## 2019-10-04 DIAGNOSIS — R233 Spontaneous ecchymoses: Secondary | ICD-10-CM

## 2019-10-04 DIAGNOSIS — M79672 Pain in left foot: Secondary | ICD-10-CM | POA: Diagnosis not present

## 2019-10-04 DIAGNOSIS — R238 Other skin changes: Secondary | ICD-10-CM | POA: Diagnosis not present

## 2019-10-04 DIAGNOSIS — F324 Major depressive disorder, single episode, in partial remission: Secondary | ICD-10-CM

## 2019-10-04 LAB — POCT GLYCOSYLATED HEMOGLOBIN (HGB A1C): Hemoglobin A1C: 5.7 % — AB (ref 4.0–5.6)

## 2019-10-04 NOTE — Progress Notes (Signed)
   Subjective:    Patient ID: [name], female    DOB: Jul 22, 1965, 54 y.o.   MRN: [id number]  HPIfollow up on starting wellbutrin 4 weeks ago. Pt states she is doing well and no concerns today.  Patient not suicidal feel she is improving Knot on top of left foot that is tender. Came up about 3 weeks ago.  A lot of ongoing pain discomfort she feels there is a knot on top of her foot denies any injury.  Sharp pain on side of right foot. Started 3 weeks ago.  Intermittent sharp pain on the right side of the foot. Pt wanted a1c done today.   Results for orders placed or performed in visit on 10/04/19  POCT glycosylated hemoglobin (Hb A1C)  Result Value Ref Range   Hemoglobin A1C 5.7 (A) 4.0 - 5.6 %   HbA1c POC (<> result, manual entry)     HbA1c, POC (prediabetic range)     HbA1c, POC (controlled diabetic range)        Review of Systems Denies any chest tightness pressure pain shortness of breath no wheezing no vomiting    Objective:   Physical Exam  Lungs clear respiratory rate normal heart is regular pulse normal foot exam the right side of the foot I do not see any evidence of a fracture I recommend holding off on x-ray currently Left side she has a prominent tarsal bone near the ankle it is very tender where x-rays to make sure there is not an underlying fracture if this      Assessment & Plan:  Patient not suicidal .daigmed 1. Diabetes mellitus without complication (HCC) Good control currently continue current meds per - POCT glycosylated hemoglobin (Hb A1C)  2. Left foot pain X-ray of the foot recommended. - DG Foot Complete Left  3. Easy bruisability Patient had previous CBC looked good I told her to stop the 81 mg aspirin I do not feel it would benefit her if she has any ongoing bruising to let us know  4. Depression, major, single episode, in partial remission (HCC) Also depression is improving continue current medications follow-up 4 months

## 2019-10-10 ENCOUNTER — Other Ambulatory Visit: Payer: Self-pay

## 2019-10-10 ENCOUNTER — Ambulatory Visit (HOSPITAL_COMMUNITY)
Admission: RE | Admit: 2019-10-10 | Discharge: 2019-10-10 | Disposition: A | Discharge status: Home / Self Care | Payer: Medicare HMO | Source: Ambulatory Visit | Attending: Family Medicine | Admitting: Family Medicine

## 2019-10-10 DIAGNOSIS — M19072 Primary osteoarthritis, left ankle and foot: Secondary | ICD-10-CM | POA: Diagnosis not present

## 2019-10-10 DIAGNOSIS — M79672 Pain in left foot: Secondary | ICD-10-CM | POA: Insufficient documentation

## 2019-10-10 DIAGNOSIS — G8929 Other chronic pain: Secondary | ICD-10-CM | POA: Diagnosis not present

## 2019-11-04 ENCOUNTER — Other Ambulatory Visit: Payer: Self-pay | Admitting: Family Medicine

## 2019-11-06 NOTE — Telephone Encounter (Signed)
Refill x 6 months 

## 2019-11-07 ENCOUNTER — Other Ambulatory Visit: Payer: Self-pay | Admitting: Family Medicine

## 2019-11-07 ENCOUNTER — Other Ambulatory Visit: Payer: Self-pay

## 2019-11-07 ENCOUNTER — Other Ambulatory Visit (HOSPITAL_COMMUNITY)
Admission: RE | Admit: 2019-11-07 | Discharge: 2019-11-07 | Disposition: A | Discharge status: Home / Self Care | Payer: Medicare HMO | Source: Ambulatory Visit | Attending: Family Medicine | Admitting: Family Medicine

## 2019-11-07 ENCOUNTER — Encounter: Payer: Self-pay | Admitting: Family Medicine

## 2019-11-07 ENCOUNTER — Ambulatory Visit (INDEPENDENT_AMBULATORY_CARE_PROVIDER_SITE_OTHER): Payer: Medicare HMO | Admitting: Family Medicine

## 2019-11-07 ENCOUNTER — Other Ambulatory Visit: Payer: Self-pay | Admitting: Nurse Practitioner

## 2019-11-07 VITALS — BP 140/84 | Temp 97.4°F | Wt 214.2 lb

## 2019-11-07 DIAGNOSIS — M79604 Pain in right leg: Secondary | ICD-10-CM | POA: Diagnosis not present

## 2019-11-07 DIAGNOSIS — M25561 Pain in right knee: Secondary | ICD-10-CM

## 2019-11-07 DIAGNOSIS — M1711 Unilateral primary osteoarthritis, right knee: Secondary | ICD-10-CM

## 2019-11-07 DIAGNOSIS — R7989 Other specified abnormal findings of blood chemistry: Secondary | ICD-10-CM

## 2019-11-07 DIAGNOSIS — G629 Polyneuropathy, unspecified: Secondary | ICD-10-CM | POA: Diagnosis not present

## 2019-11-07 LAB — CBC WITH DIFFERENTIAL/PLATELET
Abs Immature Granulocytes: 0.02 10*3/uL (ref 0.00–0.07)
Basophils Absolute: 0.1 10*3/uL (ref 0.0–0.1)
Basophils Relative: 1 %
Eosinophils Absolute: 0.2 10*3/uL (ref 0.0–0.5)
Eosinophils Relative: 3 %
HCT: 38.3 % (ref 36.0–46.0)
Hemoglobin: 12.5 g/dL (ref 12.0–15.0)
Immature Granulocytes: 0 %
Lymphocytes Relative: 30 %
Lymphs Abs: 2 10*3/uL (ref 0.7–4.0)
MCH: 30.2 pg (ref 26.0–34.0)
MCHC: 32.6 g/dL (ref 30.0–36.0)
MCV: 92.5 fL (ref 80.0–100.0)
Monocytes Absolute: 0.6 10*3/uL (ref 0.1–1.0)
Monocytes Relative: 9 %
Neutro Abs: 3.9 10*3/uL (ref 1.7–7.7)
Neutrophils Relative %: 57 %
Platelets: 368 10*3/uL (ref 150–400)
RBC: 4.14 MIL/uL (ref 3.87–5.11)
RDW: 12.8 % (ref 11.5–15.5)
WBC: 6.7 10*3/uL (ref 4.0–10.5)
nRBC: 0 % (ref 0.0–0.2)

## 2019-11-07 LAB — TSH: TSH: 0.664 u[IU]/mL (ref 0.350–4.500)

## 2019-11-07 LAB — VITAMIN B12: Vitamin B-12: 541 pg/mL (ref 180–914)

## 2019-11-07 LAB — D-DIMER, QUANTITATIVE: D-Dimer, Quant: 0.64 ug/mL-FEU — ABNORMAL HIGH (ref 0.00–0.50)

## 2019-11-07 MED ORDER — MELOXICAM 15 MG PO TABS: 15.0000 mg | ORAL_TABLET | Freq: Every day | ORAL | 0 refills | Status: DC

## 2019-11-07 NOTE — Progress Notes (Signed)
   Subjective:    Patient ID: [name], female    DOB: [birth year], 54 y.o.   MRN: [id number]  HPI Pt having right knee pain for 3 weeks. Bruise came up also when knee pain started. Pt states the pain does go to feet. Tingling and numbness in fingertips. Both knees hurt but right knee is worse. Pt is taking Tylenol, Aleve, ice and heat. No help. Throbbing, stabbing, constant pain.  Patient relates pain around the knee bruising around the knee a feeling of discomfort beneath the knee and a feeling of discomfort into the back of the thigh.  Denies any chest tightness pressure pain or shortness of breath.  Denies any injury to this has a history of osteoarthritis. Also relates some intermittent numbness tingling in the fingertips and the toes that is present over the past few days but no unilateral numbness or weakness has not had this problem before.  PMH benign  Review of Systems    Please see above Objective:   Physical Exam Lungs clear respiratory rate normal heart regular right knee does have some fluid with it.  Tenderness in the knee but no tenderness in the calf.  Likelihood of blood clot is low.  But we need to rule this out No sign of any stroke       Assessment & Plan:  1. Right leg pain Mainly knee pain but we will do a D-dimer to help rule out blood clot no swelling in the leg no tenderness in the calf  Should be noted D-dimer came back elevated therefore we will do a stat ultrasound first thing in the morning.  I do not recommend going to the ER currently because of Covid situation - D-Dimer, Quantitative - CBC with Differential - TSH - B12  2. Primary osteoarthritis of right knee May use Tylenol as needed referral to orthopedics if any ongoing troubles may need injections  3. Peripheral polyneuropathy Patient complains of tingling in the toes in the fingers if this continues referral to neurology for further evaluation and nerve conduction studies

## 2019-11-08 ENCOUNTER — Other Ambulatory Visit: Payer: Self-pay | Admitting: Family Medicine

## 2019-11-08 ENCOUNTER — Ambulatory Visit (HOSPITAL_COMMUNITY)
Admission: RE | Admit: 2019-11-08 | Discharge: 2019-11-08 | Disposition: A | Discharge status: Home / Self Care | Payer: Medicare HMO | Source: Ambulatory Visit | Attending: Family Medicine | Admitting: Family Medicine

## 2019-11-08 DIAGNOSIS — R7989 Other specified abnormal findings of blood chemistry: Secondary | ICD-10-CM

## 2019-11-08 DIAGNOSIS — M79605 Pain in left leg: Secondary | ICD-10-CM

## 2019-11-08 DIAGNOSIS — M7121 Synovial cyst of popliteal space [Baker], right knee: Secondary | ICD-10-CM | POA: Diagnosis not present

## 2019-11-08 DIAGNOSIS — M79604 Pain in right leg: Secondary | ICD-10-CM

## 2019-11-08 NOTE — Progress Notes (Signed)
Error

## 2019-11-10 ENCOUNTER — Telehealth: Payer: Self-pay | Admitting: *Deleted

## 2019-11-10 DIAGNOSIS — R040 Epistaxis: Secondary | ICD-10-CM

## 2019-11-10 NOTE — Telephone Encounter (Signed)
So this is a new development Please discussed with patient proper way to stop a nosebleed including pinching the nose for at least 10 to 15 minutes If severe nosebleed go to ER Consultation with ENT for possible ablation cauterization Avoid anti for amatory use Check CBC, PT, PTT, follow-up office visit next week with myself or Clydie Braun

## 2019-11-10 NOTE — Telephone Encounter (Signed)
Patient wanted to update you on her condition- Patient states yesterday she had 3 noses bleeds- each lasting 10-15 minutes and each had a lot of clots with them-she had to use cold washcloth to stop the bleed. Patient also had some headache yesterday but has not had any nose bleeds or headache today. Patient aware Dr Lorin Picket not in the office today but wants to leave an update

## 2019-11-10 NOTE — Telephone Encounter (Signed)
Blood work ordered in [facility]. Warning signs discussed with patient. Patient advised techniques to help stop a nosebleed. Patient scheduled follow up with Clydie Braun NP next week after having blood work completed on Monday.

## 2019-11-14 ENCOUNTER — Ambulatory Visit (INDEPENDENT_AMBULATORY_CARE_PROVIDER_SITE_OTHER): Payer: Medicare HMO | Admitting: Family Medicine

## 2019-11-14 ENCOUNTER — Other Ambulatory Visit: Payer: Self-pay

## 2019-11-14 ENCOUNTER — Ambulatory Visit (HOSPITAL_COMMUNITY)
Admission: RE | Admit: 2019-11-14 | Discharge: 2019-11-14 | Disposition: A | Discharge status: Home / Self Care | Payer: Medicare HMO | Source: Ambulatory Visit | Attending: Family Medicine | Admitting: Family Medicine

## 2019-11-14 ENCOUNTER — Encounter: Payer: Self-pay | Admitting: Family Medicine

## 2019-11-14 VITALS — BP 142/96 | HR 103 | Temp 97.9°F | Ht 65.0 in | Wt 214.0 lb

## 2019-11-14 DIAGNOSIS — R7989 Other specified abnormal findings of blood chemistry: Secondary | ICD-10-CM | POA: Diagnosis not present

## 2019-11-14 DIAGNOSIS — I10 Essential (primary) hypertension: Secondary | ICD-10-CM | POA: Diagnosis not present

## 2019-11-14 DIAGNOSIS — R040 Epistaxis: Secondary | ICD-10-CM | POA: Diagnosis not present

## 2019-11-14 DIAGNOSIS — M79604 Pain in right leg: Secondary | ICD-10-CM | POA: Diagnosis not present

## 2019-11-14 DIAGNOSIS — M79605 Pain in left leg: Secondary | ICD-10-CM | POA: Insufficient documentation

## 2019-11-14 DIAGNOSIS — M7121 Synovial cyst of popliteal space [Baker], right knee: Secondary | ICD-10-CM | POA: Diagnosis not present

## 2019-11-14 MED ORDER — LOSARTAN POTASSIUM 50 MG PO TABS: 50.0000 mg | ORAL_TABLET | Freq: Every day | ORAL | 0 refills | Status: DC

## 2019-11-14 NOTE — Progress Notes (Signed)
Patient ID: [name], female    DOB: 03-Oct-1965, 54 y.o.   MRN: [id number]   Chief Complaint  Patient presents with  . Epistaxis   Subjective:  CC: nose bleed started on Friday  HPIhad a nose bleed 2 days ago that bleed off and on the whole day. Started at 0100 on Friday, again at 1100, and again in the evening on Friday. Held pressure on bony part of nose and used cold cloth. Had picture of clot that came from nose. This is not a new problem, however, she has never had three nose bleeds in one day. Not taking ASA currently. Stopped on the way in for lab work: PT, PTT, and CBC that was ordered by Dr. Sallee Lange. No results during this visit.  Right knee pain for a about 4 weeks. Tried aleve, tylenol. Saw dr Nicki Reaper on 9/28 and had ultrasound done to rule-out clot, getting second ultrasound today. He will receive results of this and review once available.    Medical History [name] has a past medical history of Allergy, Anxiety, Arthritis, Chronic cough, Chronic low back pain, Chronic pain syndrome, Diabetes mellitus without complication ([place]), Folliculitis (04/09/6008), GERD (gastroesophageal reflux disease), Hidradenitis suppurativa, Hypertension, IBS (irritable bowel syndrome), Lumbar spondylolysis, Migraine headache, Pulmonary nodule, Reactive airway disease (01/2018), and Sleep apnea.   Outpatient Encounter Medications as of 11/14/2019  Medication Sig  . albuterol (PROVENTIL HFA;VENTOLIN HFA) 108 (90 Base) MCG/ACT inhaler Inhale 2 puffs into the lungs every 4 (four) hours as needed.  Marland Kitchen amLODipine (NORVASC) 5 MG tablet Take 1 tablet (5 mg total) by mouth daily.  . Ascorbic Acid (VITAMIN C PO) Take by mouth.  . blood glucose meter kit and supplies Dispense based on patient and insurance preference. Use to test sugar once a day. For dx E11.9  . buPROPion (WELLBUTRIN SR) 150 MG 12 hr tablet Take 1 tablet (150 mg total) by mouth 2 (two) times daily.  . diclofenac (VOLTAREN) 75  MG EC tablet Take 1 tablet by mouth twice daily  . DULoxetine (CYMBALTA) 30 MG capsule Take 3 capsules po daily  . famotidine (PEPCID) 20 MG tablet Take 1 tablet (20 mg total) by mouth 2 (two) times daily.  . fluticasone (FLONASE) 50 MCG/ACT nasal spray Place 2 sprays into both nostrils daily as needed for allergies or rhinitis.  . fluticasone (FLOVENT HFA) 110 MCG/ACT inhaler Inhale 2 puffs into the lungs 2 (two) times daily.  . Fremanezumab-vfrm (AJOVY) 225 MG/1.5ML SOAJ Inject 225 mg into the skin every 30 (thirty) days.  . Fremanezumab-vfrm (AJOVY) 225 MG/1.5ML SOAJ Inject 225 mg into the skin every 30 (thirty) days.  . meloxicam (MOBIC) 15 MG tablet Take 1 tablet (15 mg total) by mouth daily.  . metoprolol tartrate (LOPRESSOR) 50 MG tablet Take 1 tablet (50 mg total) by mouth 2 (two) times daily.  . Multiple Vitamins-Minerals (MULTIPLE VITAMINS/WOMENS PO) Take by mouth.  . RELION PEN NEEDLES 31G X 6 MM MISC AS DIRECTED FOR  VICTOZA  PEN  . rosuvastatin (CRESTOR) 5 MG tablet TAKE 1 TABLET BY MOUTH ONCE DAILY FOR CHOLESTEROL  . triamterene-hydrochlorothiazide (MAXZIDE-25) 37.5-25 MG tablet Take 1 tablet by mouth daily.  Marland Kitchen VICTOZA 18 MG/3ML SOPN INJECT 0.3MLS (1.8MG TOTAL) INTO THE SKIN DAILY  . VITAMIN D PO Take by mouth.  . [DISCONTINUED] losartan (COZAAR) 25 MG tablet TAKE 1 TABLET BY MOUTH ONCE DAILY FOR BLOOD PRESSURE AND KIDNEYS  . losartan (COZAAR) 50 MG tablet Take 1 tablet (50 mg  total) by mouth daily.   No facility-administered encounter medications on file as of 11/14/2019.     Review of Systems  Constitutional: Negative.   HENT: Positive for nosebleeds.        Three on Friday. None since.   Eyes: Negative.   Respiratory: Negative.   Musculoskeletal:       Ongoing issue with knee pain R>L. Seeing Dr. Sallee Lange for this.     Vitals BP (!) 142/96   Pulse (!) 103   Temp 97.9 F (36.6 C)   Ht _0  (1.651 m)   Wt 214 lb (97.1 kg)   SpO2 99%   BMI 35.61 kg/m    Objective:   Physical Exam Vitals and nursing note reviewed.  Constitutional:      Appearance: Normal appearance.  HENT:     Nose: Nasal tenderness and mucosal edema present.     Comments: Right nare with edema noted. No obvious area of irritation visualized. No bleeding currently. Neurological:     Mental Status: She is alert.      Assessment and Plan   1. Epistaxis  2. Elevated blood pressure reading in office with diagnosis of hypertension - losartan (COZAAR) 50 MG tablet; Take 1 tablet (50 mg total) by mouth daily.  Dispense: 90 tablet; Refill: 0   Concerned that her nose bled three times on Friday. It is possible she did not hold pressure in the correct place on Friday. Reinforced proper area to hold (cartilage). She was able to demonstrate proper technique in office.  Plan: Use saline nasal spray daily and put Vaseline (or similar brand) inside right nare to prevent future nose bleeds. If the nose does bleed, use Afrin (or similar brand) one spray to side bleeding and hold pressure using technique described for 5-10 minutes.   She is experiencing pain in both knees.  This is a problem that she is seeing Dr. Wolfgang Phoenix for.  She is concerned about her knees and perhaps is why her blood pressure is elevated today due to the pain. I rechecked her blood pressure in the office and it was 148/96.  I will increase her losartan dose from 25 mg to 50 mg daily and she will follow up in 1 month to ensure effectiveness of this change in therapy.  She is getting her second ultrasound on her knees today to rule out blood clot.  She also stopped by the lab on her way in today to get a PT, PTT, and CBC done results not available for this visit.  These results will come to Dr. Sallee Lange and he will review them and give her feedback at that time.  Agrees with plan of care discussed today. Understands warning signs to seek further care: nose bleed that does not stop with treatment discussed  today. Changes in vision, headaches, that would indicate her blood pressure is not being controlled. Understands to follow-up in one month for blood pressure and that Dr. Wolfgang Phoenix will communicate with her once her ultrasound results are available.   Pecolia Ades, FNP-C  11/14/2019

## 2019-11-14 NOTE — Patient Instructions (Addendum)
Use saline spray daily followed by vaseline to nose. If your nose bleeds again, use Afrin spray one spray to side of nose that is bleeding and hold direct pressure as you practiced in the office for 5-10 minutes without stopping. Increase  Your blood pressure medicine Losartin to 50 mg daily and follow up in one month for blood pressure.    Oxymetazoline nasal spray What is this medicine? Oxymetazoline (OX ee me TAZ oh leen) is a nasal decongestant. This medicine is used to treat nasal congestion or a stuffy nose. This medicine will not treat an infection. This medicine may be used for other purposes; ask your health care provider or pharmacist if you have questions. COMMON BRAND NAME(S): 12 Hour Nasal, Afrin, Afrin Extra Moisturizing, Afrin Nasal Sinus, Afrin No Drip Severe Congestion, Dristan, Duration, Genasal, Mucinex Children's Stuffy Nose, Mucinex Full Force, Mucinex Moisture Smart, Mucinex Sinus-Max, Mucinex Sinus-Max Sinus & Allergy, NASAL Decongestant, Nasal Relief, Neo-Synephrine 12-Hour, Neo-Synephrine Severe Sinus Congestion, Nostrilla Fast Relief, Sinex 12-Hour, Sudafed OM Sinus Cold Moisturizing, Sudafed OM Sinus Congestion Moisturizing, Vicks Qlearquil Decongestant, Vicks Sinex, Vicks Sinex Severe, Vicks Sinus Daytime, Zicam Extreme Congestion Relief, Zicam Intense Sinus What should I tell my health care provider before I take this medicine? They need to know if you have any of these conditions:  diabetes  glaucoma  heart disease  high or low blood pressure  history of stroke  Raynaud's phenomenon  scleroderma  Sjogren's syndrome  thromboangiitis obliterans  thyroid disease  trouble urinating due to an enlarged prostate gland  an unusual or allergic reaction to oxymetazoline, other medicines, foods, dyes, or preservatives  pregnant or trying to get pregnant  breast-feeding How should I use this medicine? This medicine is for use in the nose. Do not take by  mouth. Follow the directions on the package label. Shake well before using. Use your medicine at regular intervals or as directed by your health care provider. Do not use it more often than directed. Do not use for more than 3 days in a row without advice. Make sure that you are using your nasal spray correctly. Ask your doctor or health care provider if you have any questions. Talk to your pediatrician regarding the use of this medicine in children. While this drug may be prescribed for children as young as 6 years for selected conditions, precautions do apply. Overdosage: If you think you have taken too much of this medicine contact a poison control center or emergency room at once. NOTE: This medicine is only for you. Do not share this medicine with others. What if I miss a dose? If you miss a dose, use it as soon as you can. If it is almost time for your next dose, use only that dose. Do not use double or extra doses. What may interact with this medicine? The medicine may interaction with the following medications:  MAOIs like isocarboxazid, phenelzine, rasagiline, selegiline, and tranylcypromine  medicines to treat blood pressure and heart disease like ace-inhibitors, beta-blockers, calcium-channel blockers, digoxin, and diuretics  medicines to treat enlarged prostate like alfuzosin, doxazosin, prazosin, and terazosin  nafarelin This list may not describe all possible interactions. Give your health care provider a list of all the medicines, herbs, non-prescription drugs, or dietary supplements you use. Also tell them if you smoke, drink alcohol, or use illegal drugs. Some items may interact with your medicine. What should I watch for while using this medicine? Tell your doctor or health care professional if your symptoms  do not start to get better or if they get worse. To prevent the spread of infection, do not share bottle with anyone else. What side effects may I notice from receiving this  medicine? Side effects that you should report to your doctor or health care professional as soon as possible:  allergic reactions like skin rash, itching or hives, swelling of the face, lips, or tongue Side effects that usually do not require medical attention (report to your doctor or health care professional if they continue or are bothersome):  burning, stinging, or irritation in the nose right after use  increased nasal discharge  sneezing This list may not describe all possible side effects. Call your doctor for medical advice about side effects. You may report side effects to FDA at 1-800-FDA-1088. Where should I keep my medicine? Keep out of the reach of children. Store at room temperature between 20 and 25 degrees C (68 and 77 degrees F). Throw away any unused medicine after the expiration date. NOTE: This sheet is a summary. It may not cover all possible information. If you have questions about this medicine, talk to your doctor, pharmacist, or health care provider.  2020 Elsevier/Gold Standard (2015-03-21 13:48:04)

## 2019-11-15 ENCOUNTER — Other Ambulatory Visit: Payer: Self-pay | Admitting: Family Medicine

## 2019-11-15 DIAGNOSIS — M25561 Pain in right knee: Secondary | ICD-10-CM

## 2019-11-15 DIAGNOSIS — M79605 Pain in left leg: Secondary | ICD-10-CM

## 2019-11-15 DIAGNOSIS — M79604 Pain in right leg: Secondary | ICD-10-CM

## 2019-11-15 LAB — CBC WITH DIFFERENTIAL/PLATELET
Basophils Absolute: 0.1 10*3/uL (ref 0.0–0.2)
Basos: 1 %
EOS (ABSOLUTE): 0.3 10*3/uL (ref 0.0–0.4)
Eos: 4 %
Hematocrit: 38.8 % (ref 34.0–46.6)
Hemoglobin: 12.7 g/dL (ref 11.1–15.9)
Immature Grans (Abs): 0 10*3/uL (ref 0.0–0.1)
Immature Granulocytes: 0 %
Lymphocytes Absolute: 2.4 10*3/uL (ref 0.7–3.1)
Lymphs: 36 %
MCH: 29.7 pg (ref 26.6–33.0)
MCHC: 32.7 g/dL (ref 31.5–35.7)
MCV: 91 fL (ref 79–97)
Monocytes Absolute: 0.6 10*3/uL (ref 0.1–0.9)
Monocytes: 9 %
Neutrophils Absolute: 3.4 10*3/uL (ref 1.4–7.0)
Neutrophils: 50 %
Platelets: 326 10*3/uL (ref 150–450)
RBC: 4.28 x10E6/uL (ref 3.77–5.28)
RDW: 13.1 % (ref 11.7–15.4)
WBC: 6.6 10*3/uL (ref 3.4–10.8)

## 2019-11-15 LAB — PT AND PTT
INR: 1 (ref 0.9–1.2)
Prothrombin Time: 10.1 s (ref 9.1–12.0)
aPTT: 30 s (ref 24–33)

## 2019-11-20 DIAGNOSIS — M1711 Unilateral primary osteoarthritis, right knee: Secondary | ICD-10-CM | POA: Diagnosis not present

## 2019-11-22 ENCOUNTER — Other Ambulatory Visit: Payer: Self-pay | Admitting: *Deleted

## 2019-11-22 ENCOUNTER — Telehealth: Payer: Self-pay | Admitting: *Deleted

## 2019-11-22 MED ORDER — MELOXICAM 15 MG PO TABS
15.0000 mg | ORAL_TABLET | Freq: Every day | ORAL | 0 refills | Status: DC
Start: 2019-11-22 — End: 2020-04-26

## 2019-11-22 NOTE — Telephone Encounter (Signed)
Patient called stating she had an appointment with Dr. Farris Has at [facility] and they talked to her about doing a full knee replacement. Patient states she has discussed this with you in the past and would like to have a virtual visit with you to discuss before her next appointment with them which she believes is 10/27. Patient already has follow up scheduled with you 10/26 and aware this may be the soonest she could be seen but she was adamant about sending you a message. Also requesting a refill on meloxicam, she prefers this over the diclofenac.

## 2019-11-22 NOTE — Telephone Encounter (Signed)
Stop diclofenac May do meloxicam 15 mg 1 daily, #30 May have a virtual visit for next week with me thank you (I realize schedule is crazy)

## 2019-11-22 NOTE — Telephone Encounter (Signed)
Medication sent (90 day ok per Dr. Lorin Picket). Please contact pt to schedule her for a virtual visit with Dr. Lorin Picket next week. Use same day if needed.

## 2019-11-23 NOTE — Telephone Encounter (Signed)
Patient's already has appointment 10/26

## 2019-12-01 IMAGING — CT CT HEAD W/O CM
3 series · 15 of 47 positions shown, 18 images · non-contrast
Comparison: 06/17/2015

CLINICAL DATA: Fall, hit head.

EXAM:
CT HEAD WITHOUT CONTRAST
TECHNIQUE: Contiguous axial images were obtained from the base of the skull
through the vertex without intravenous contrast.

[Series 2: head trauma wo · axial · 0.41mm/px · z∈[+269,+394]mm · 9 of 30 slices shown, 12 images]
[im 3/30  brain]
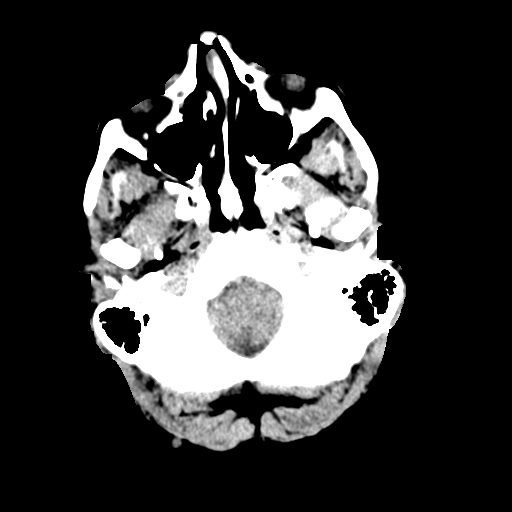
[im 3/30  bone]
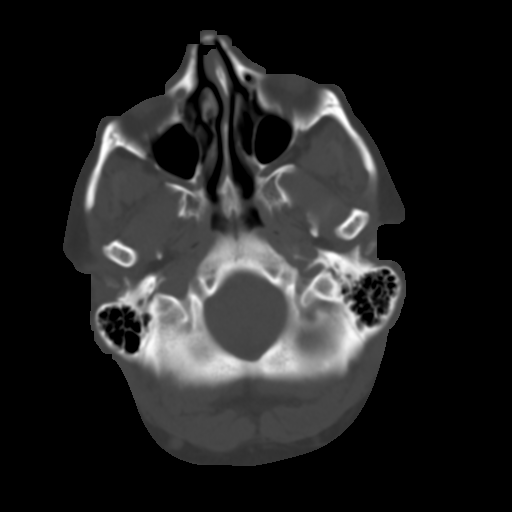
[im 6/30  brain]
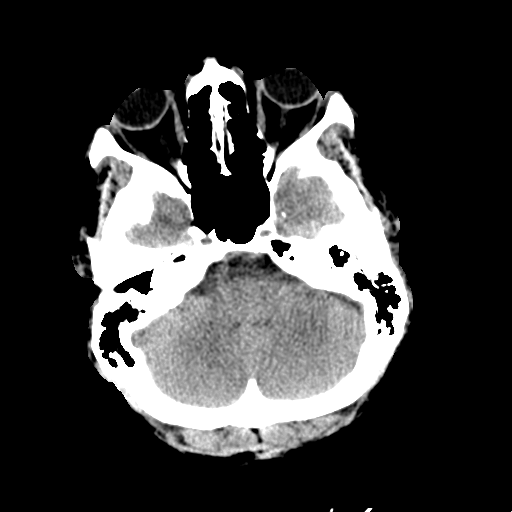
[im 9/30  brain]
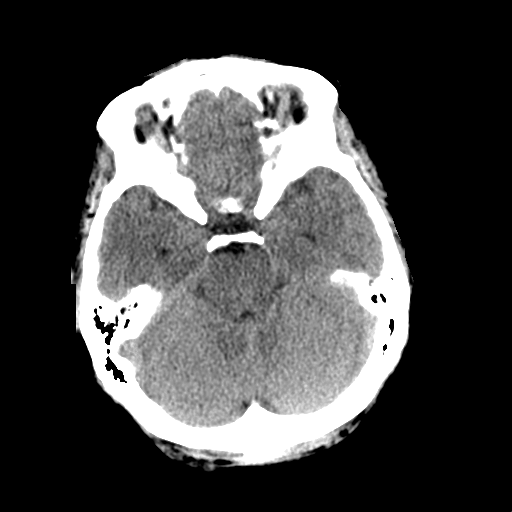
[im 12/30  brain]
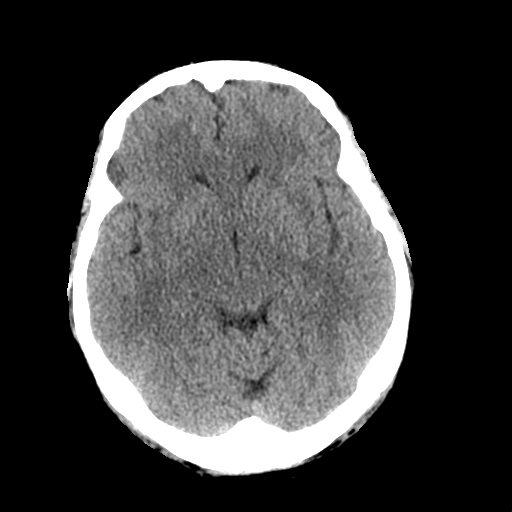
[im 16/30  brain]
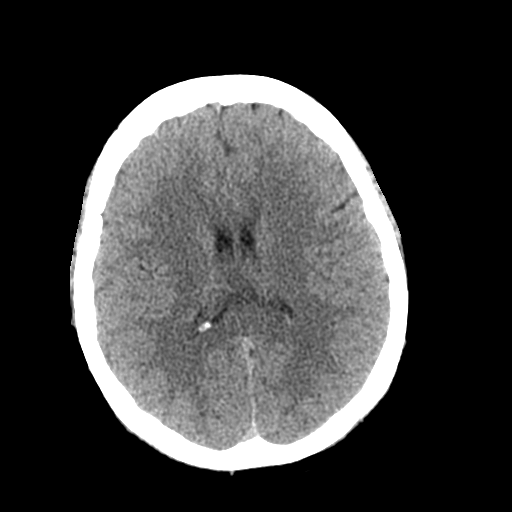
[im 16/30  bone]
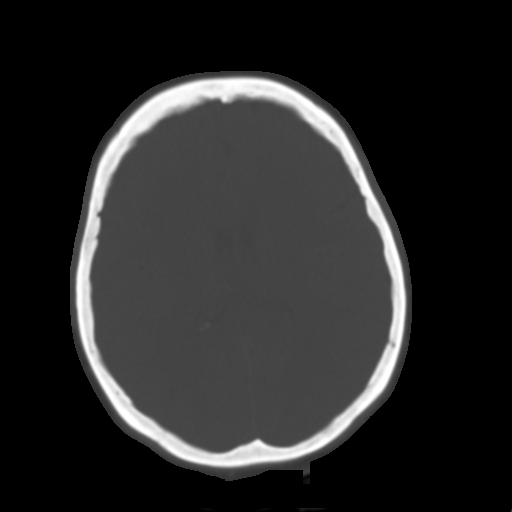
[im 19/30  brain]
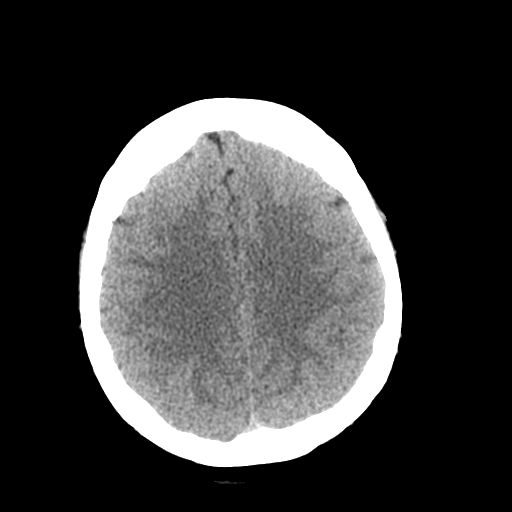
[im 22/30  brain]
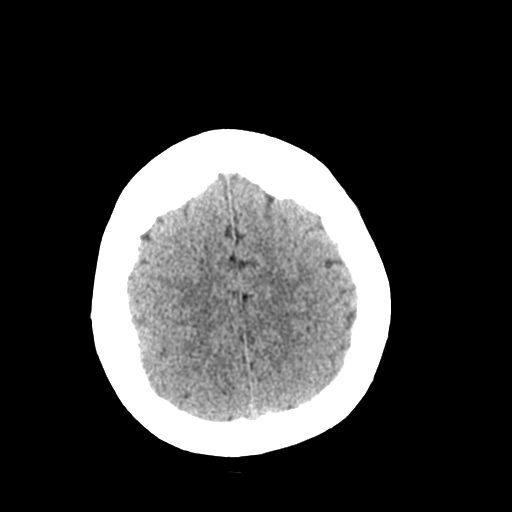
[im 25/30  brain]
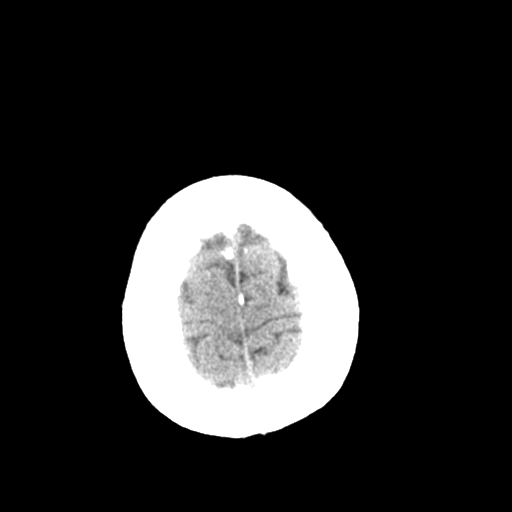
[im 28/30  brain]
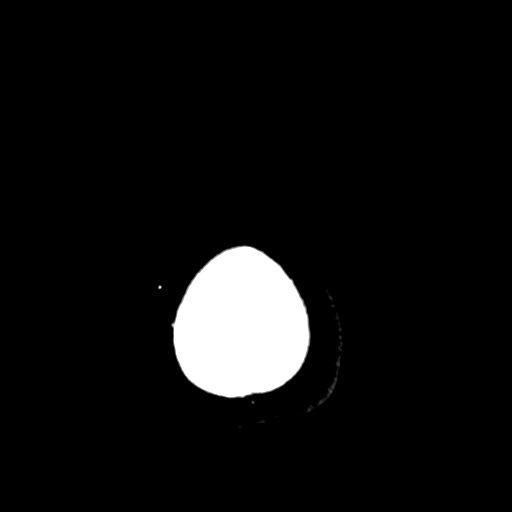
[im 28/30  bone]
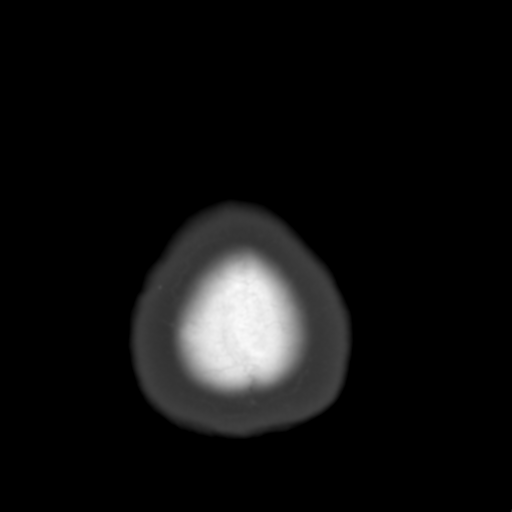

[Series 4: coronal soft tissue · coronal · 0.29mm/px · 3 of 63 slices shown]
[im 21/63  brain]
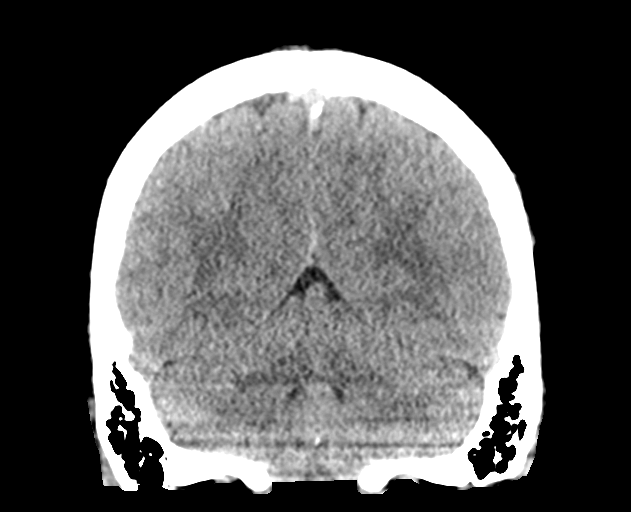
[im 28/63  brain]
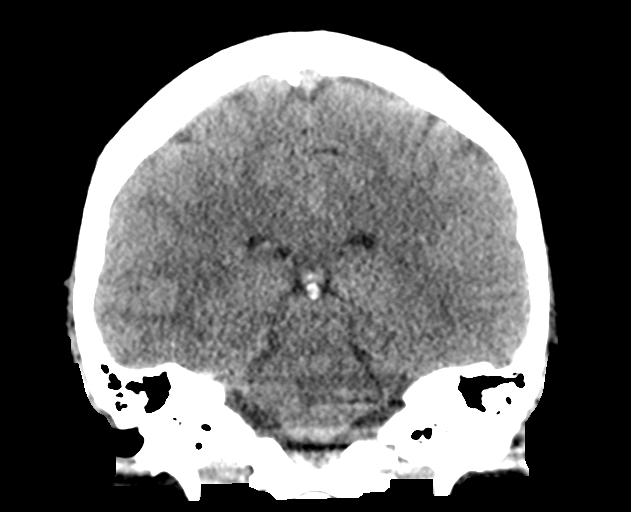
[im 35/63  brain]
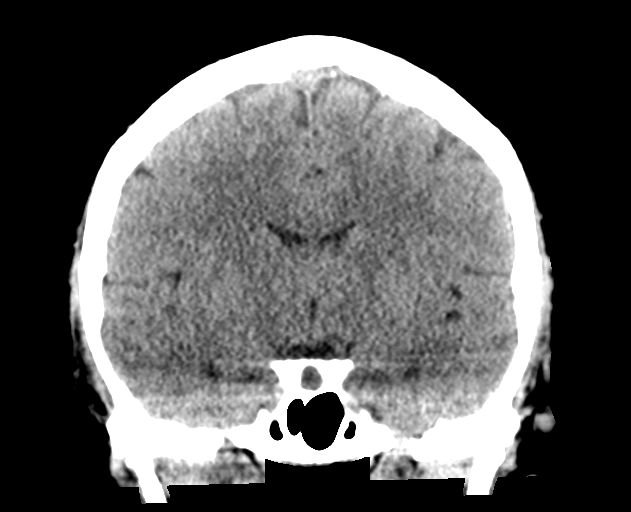

[Series 5: sagittal soft tissue · sagittal · 0.33mm/px · 3 of 56 slices shown]
[im 19/56  brain]
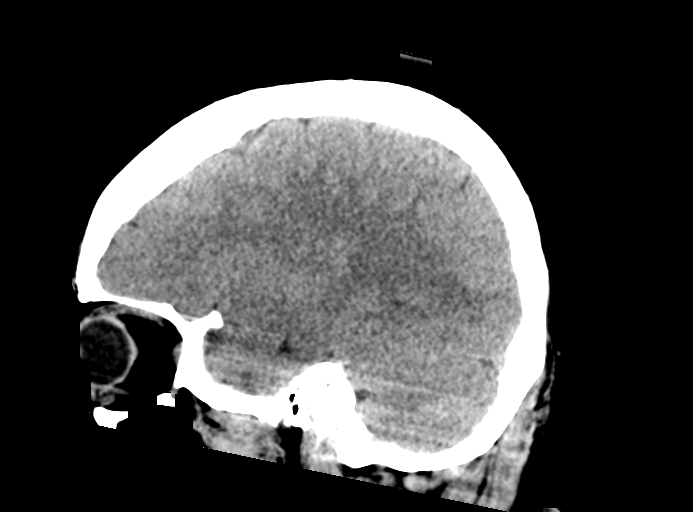
[im 28/56  brain]
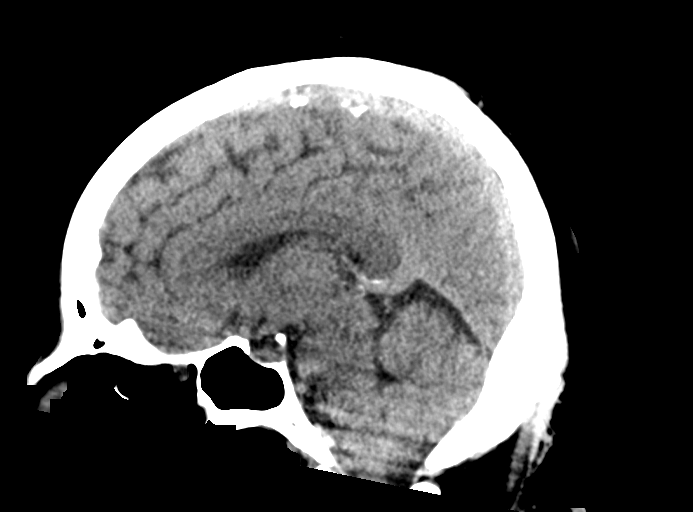
[im 37/56  brain]
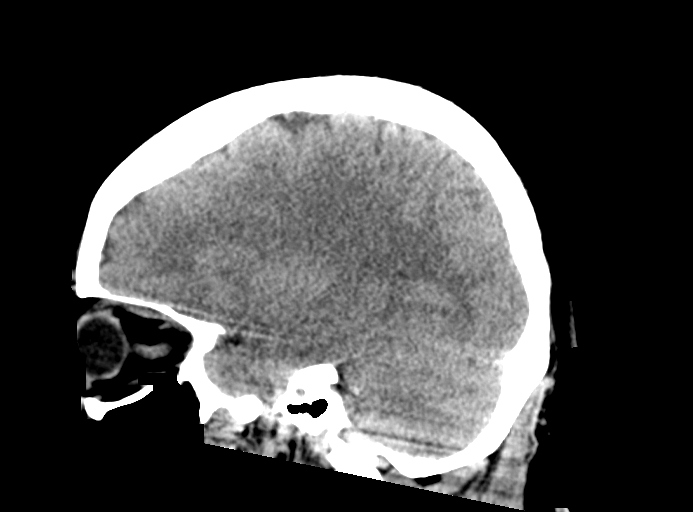

[15 of 47 positions shown; findings below may reference images not displayed]

FINDINGS: Brain: No acute intracranial abnormality. Specifically, no
hemorrhage, hydrocephalus, mass lesion, acute infarction, or
significant intracranial injury.

Vascular: No hyperdense vessel or unexpected calcification.

Skull: No acute calvarial abnormality.

Sinuses/Orbits: Visualized paranasal sinuses and mastoids clear.
Orbital soft tissues unremarkable.

Other: None
IMPRESSION: Normal study.

## 2019-12-01 IMAGING — DX DG KNEE COMPLETE 4+V*R*
4 series · 4 of 4 positions shown · non-contrast
Comparison: MRI from 04/14/2010

CLINICAL DATA: Right knee pain following fall, initial encounter

EXAM:
RIGHT KNEE - COMPLETE 4+ VIEW

[knee ap (1 of 3)]
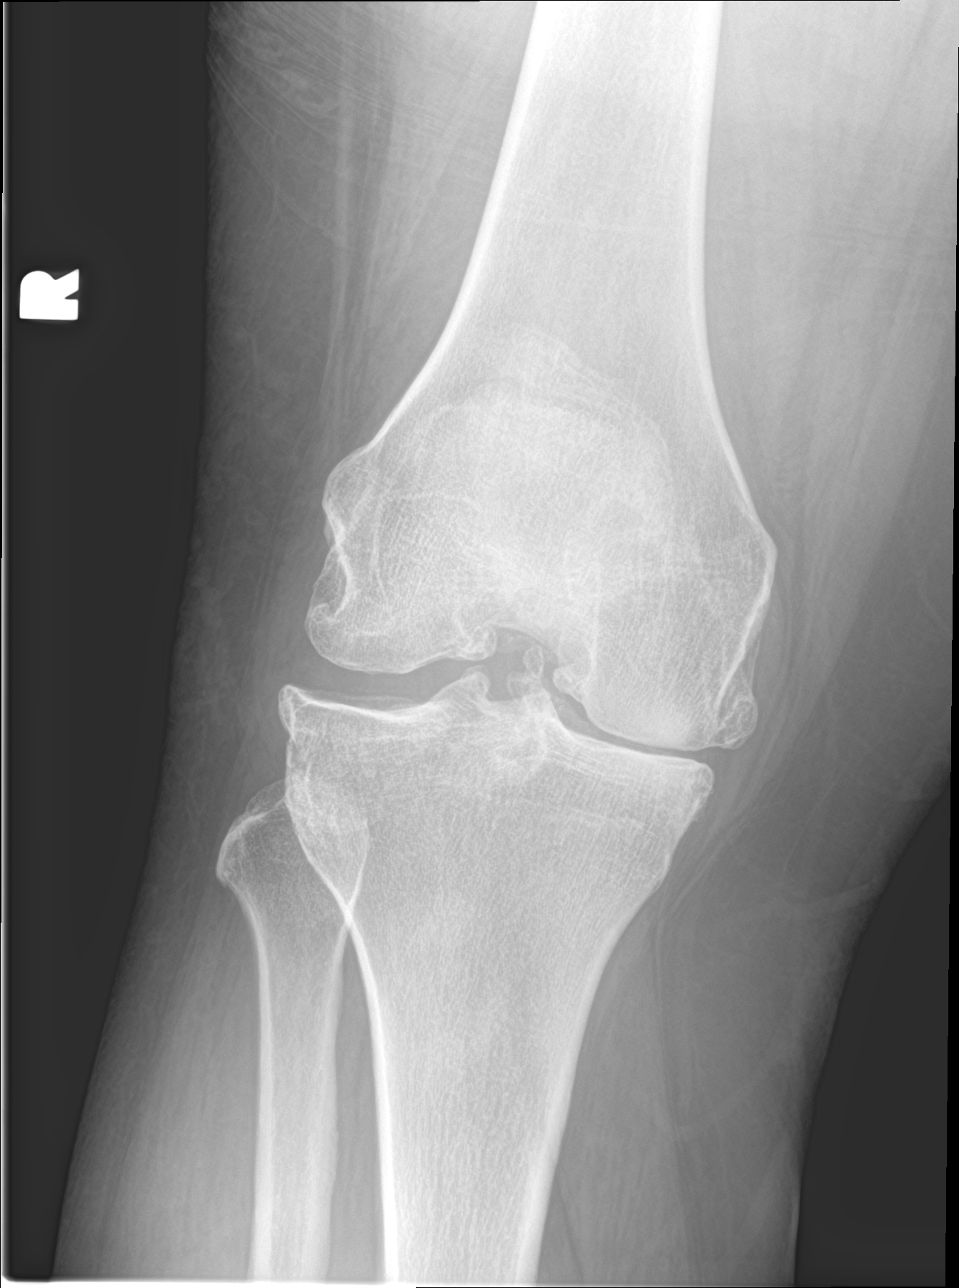

[knee ap (2 of 3)]
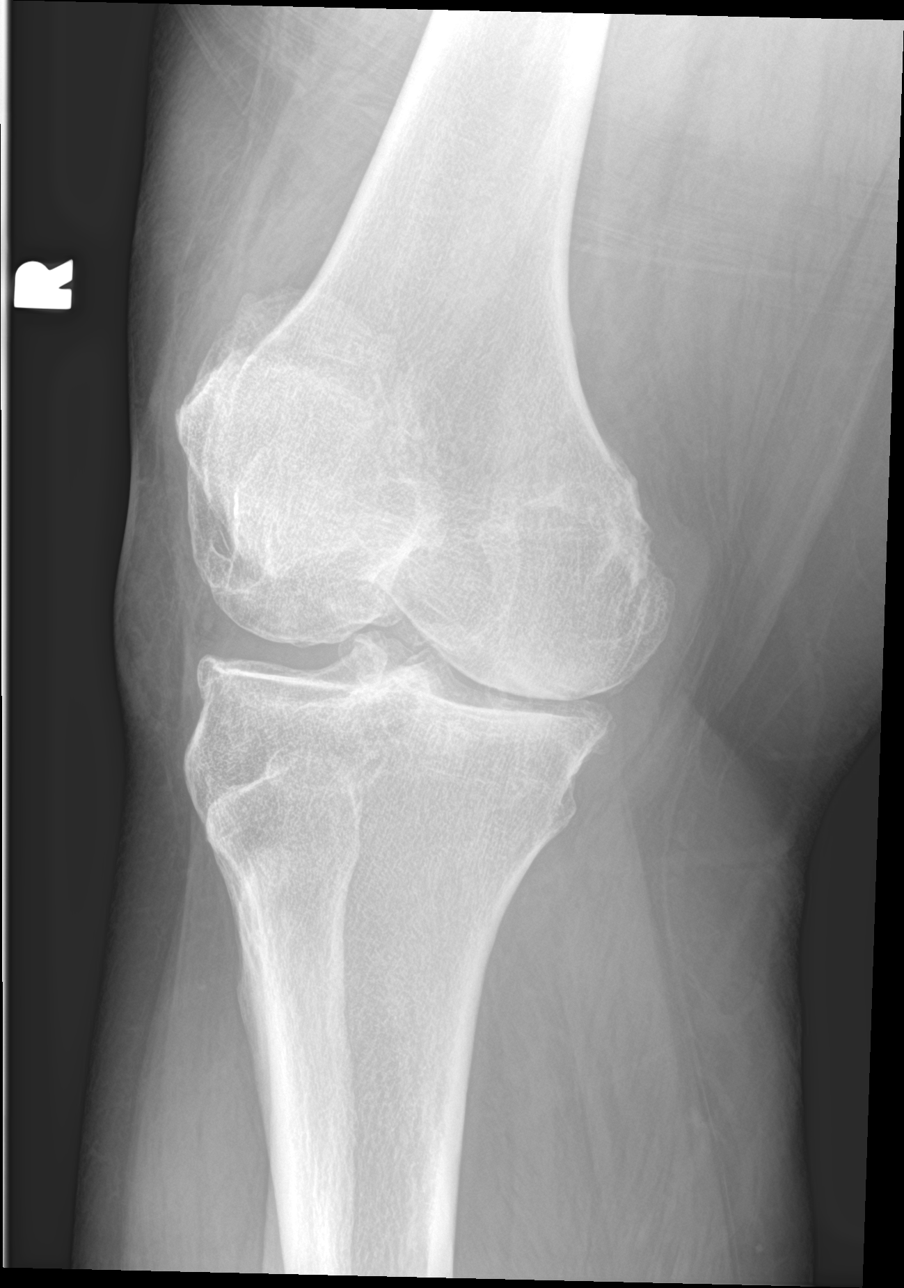

[knee ap (3 of 3)]
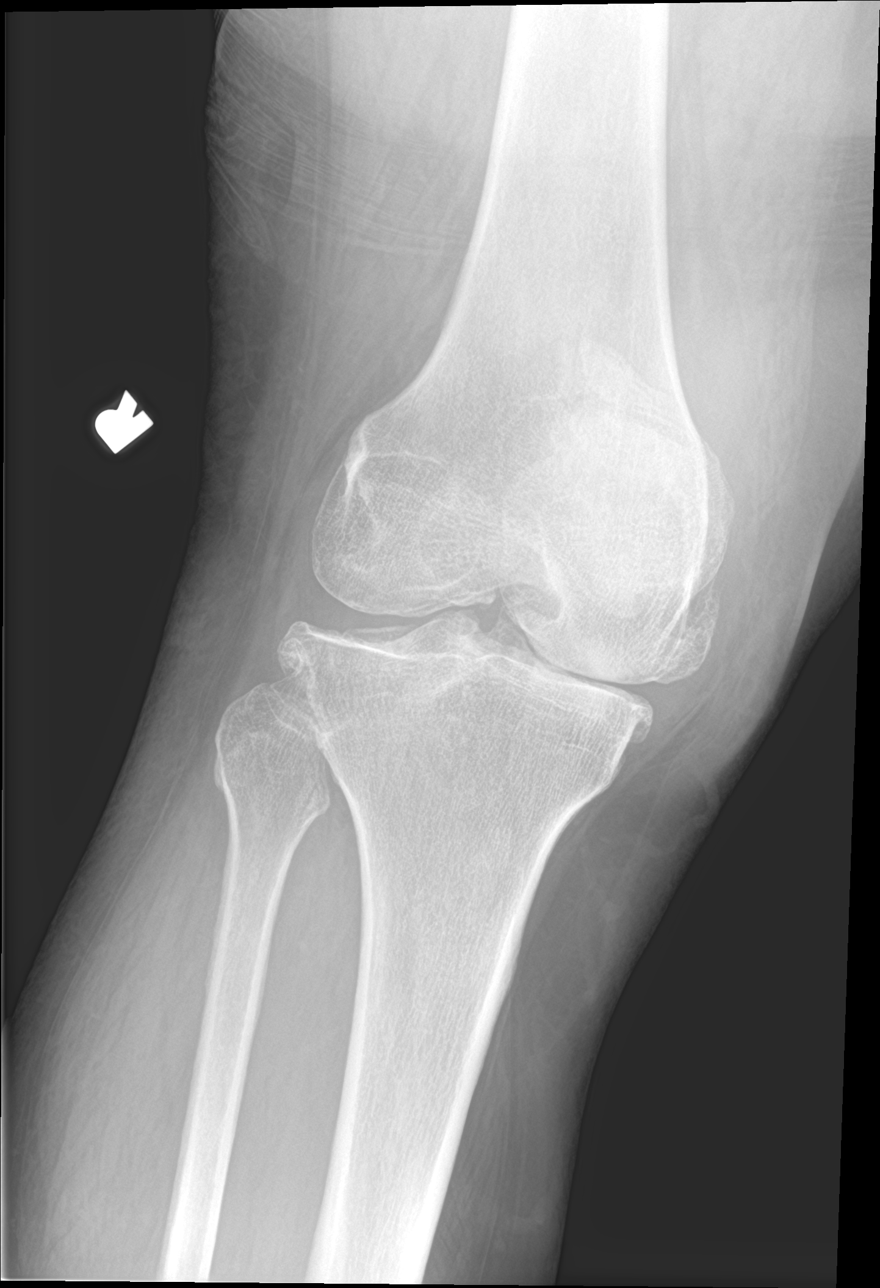

[knee lat]
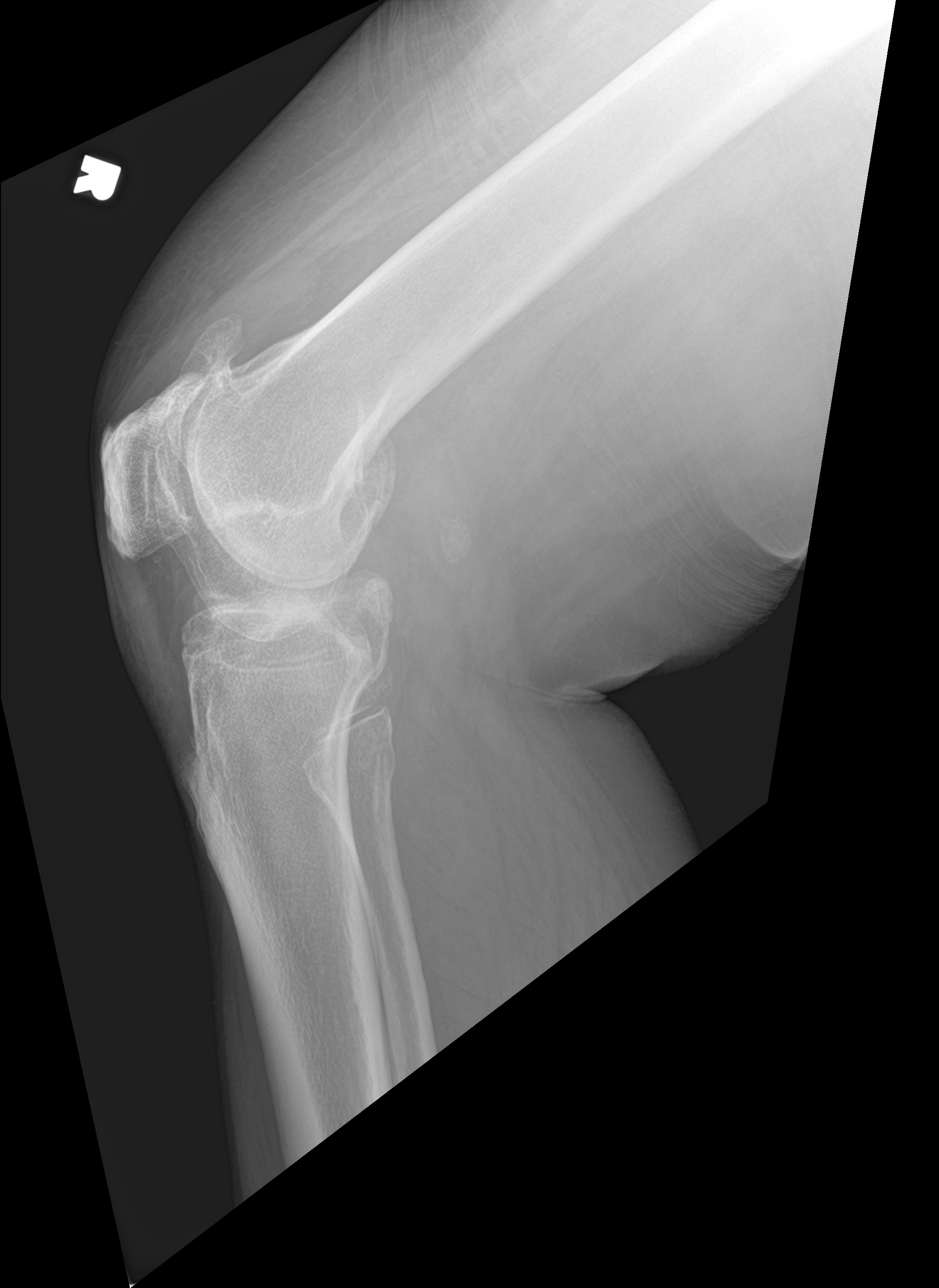

[4 of 4 positions shown; findings below may reference images not displayed]

FINDINGS: Tricompartmental degenerative changes are noted worst in the medial
joint space. No joint effusion is seen. No acute fracture or
dislocation is noted.
IMPRESSION: Degenerative changes without acute abnormality.

## 2019-12-05 ENCOUNTER — Ambulatory Visit (INDEPENDENT_AMBULATORY_CARE_PROVIDER_SITE_OTHER): Payer: Medicare HMO | Admitting: Family Medicine

## 2019-12-05 ENCOUNTER — Other Ambulatory Visit: Payer: Self-pay

## 2019-12-05 ENCOUNTER — Encounter: Payer: Self-pay | Admitting: Family Medicine

## 2019-12-05 VITALS — BP 136/90 | HR 69 | Temp 97.9°F | Wt 212.0 lb

## 2019-12-05 DIAGNOSIS — E785 Hyperlipidemia, unspecified: Secondary | ICD-10-CM

## 2019-12-05 DIAGNOSIS — E119 Type 2 diabetes mellitus without complications: Secondary | ICD-10-CM

## 2019-12-05 DIAGNOSIS — R519 Headache, unspecified: Secondary | ICD-10-CM | POA: Diagnosis not present

## 2019-12-05 DIAGNOSIS — G4739 Other sleep apnea: Secondary | ICD-10-CM

## 2019-12-05 DIAGNOSIS — G629 Polyneuropathy, unspecified: Secondary | ICD-10-CM | POA: Diagnosis not present

## 2019-12-05 DIAGNOSIS — M25561 Pain in right knee: Secondary | ICD-10-CM | POA: Diagnosis not present

## 2019-12-05 DIAGNOSIS — Z23 Encounter for immunization: Secondary | ICD-10-CM

## 2019-12-05 MED ORDER — GABAPENTIN 100 MG PO CAPS: 100.0000 mg | ORAL_CAPSULE | Freq: Three times a day (TID) | ORAL | 0 refills | Status: DC

## 2019-12-05 MED ORDER — LOSARTAN POTASSIUM 100 MG PO TABS: 100.0000 mg | ORAL_TABLET | Freq: Every day | ORAL | 0 refills | Status: DC

## 2019-12-05 MED ORDER — ONDANSETRON 8 MG PO TBDP: 8.0000 mg | ORAL_TABLET | Freq: Three times a day (TID) | ORAL | 4 refills | Status: DC | PRN

## 2019-12-05 NOTE — Progress Notes (Signed)
   Subjective:    Patient ID: [name], female    DOB: 1965-07-16, 54 y.o.   MRN: [id number]  Diabetes She presents for her follow-up diabetic visit. She has type 2 diabetes mellitus. Hypoglycemia symptoms include headaches. Pertinent negatives for hypoglycemia include no confusion or dizziness. Associated symptoms include fatigue. Pertinent negatives for diabetes include no chest pain, no polydipsia, no polyphagia and no weakness. There are no diabetic complications.   Patient reports numbness, tingling and aching in hands, feet and legs x 2 months.  Patient reports headaches x 2 weeks.    Review of Systems  Constitutional: Positive for fatigue. Negative for activity change and appetite change.  HENT: Negative for congestion and rhinorrhea.   Respiratory: Negative for cough and shortness of breath.   Cardiovascular: Negative for chest pain and leg swelling.  Gastrointestinal: Negative for abdominal pain and diarrhea.  Endocrine: Negative for polydipsia and polyphagia.  Skin: Negative for color change.  Neurological: Positive for light-headedness and headaches. Negative for dizziness and weakness.  Psychiatric/Behavioral: Negative for behavioral problems and confusion.       Objective:   Physical Exam Vitals reviewed.  Constitutional:      General: She is not in acute distress. HENT:     Head: Normocephalic and atraumatic.  Eyes:     General:        Right eye: No discharge.        Left eye: No discharge.  Neck:     Trachea: No tracheal deviation.  Cardiovascular:     Rate and Rhythm: Normal rate and regular rhythm.     Heart sounds: Normal heart sounds. No murmur heard.   Pulmonary:     Effort: Pulmonary effort is normal. No respiratory distress.     Breath sounds: Normal breath sounds.  Lymphadenopathy:     Cervical: No cervical adenopathy.  Skin:    General: Skin is warm and dry.  Neurological:     Mental Status: She is alert.     Coordination:  Coordination normal.  Psychiatric:        Behavior: Behavior normal.           Assessment & Plan:  1. Diabetes mellitus without complication (HCC) I do not trust our A1c machine here we will recheck A1c through blood work regular diet watching diet as well as take the medicine recommended - POCT glycosylated hemoglobin (Hb A1C) - Hemoglobin A1c  2. Peripheral polyneuropathy Peripheral neuropathy check lab work including B12 also send message to neurology I believe the patient would benefit from nerve conduction studies possible developing carpal tunnel as well - Comprehensive metabolic panel - Vitamin B12 We will try gabapentin 3 times daily slowly titrate up warning signs were discussed with him regarding drowsiness stop medicine if causing any severe issues 3. Frequent headaches Frequent headaches I believe stress related to some degree. Hopefully gabapentin will help some as well as some behavioral techniques continue current measures 4. Right knee pain, unspecified chronicity Seeing orthopedist for evaluation possible surgery coming up versus injections  5. Hyperlipidemia LDL goal <70 Check lab work watch diet stay active take medicine - Lipid panel  6. Other sleep apnea Has significant fatigue tiredness snoring sleepiness in the morning as well as during the day home sleep test recommended - Home sleep test  7. Need for vaccination Flu shot recommended - Flu Vaccine QUAD 36+ mos IM

## 2019-12-06 ENCOUNTER — Telehealth: Payer: Self-pay | Admitting: *Deleted

## 2019-12-06 DIAGNOSIS — M1711 Unilateral primary osteoarthritis, right knee: Secondary | ICD-10-CM | POA: Diagnosis not present

## 2019-12-06 LAB — COMPREHENSIVE METABOLIC PANEL
ALT: 12 IU/L (ref 0–32)
AST: 16 IU/L (ref 0–40)
Albumin/Globulin Ratio: 1.5 (ref 1.2–2.2)
Albumin: 4.9 g/dL (ref 3.8–4.9)
Alkaline Phosphatase: 77 IU/L (ref 44–121)
BUN/Creatinine Ratio: 16 (ref 9–23)
BUN: 15 mg/dL (ref 6–24)
Bilirubin Total: 0.4 mg/dL (ref 0.0–1.2)
CO2: 27 mmol/L (ref 20–29)
Calcium: 10.5 mg/dL — ABNORMAL HIGH (ref 8.7–10.2)
Chloride: 96 mmol/L (ref 96–106)
Creatinine, Ser: 0.94 mg/dL (ref 0.57–1.00)
GFR calc Af Amer: 78 mL/min/{1.73_m2} (ref >59)
GFR calc non Af Amer: 68 mL/min/{1.73_m2} (ref >59)
Globulin, Total: 3.3 g/dL (ref 1.5–4.5)
Glucose: 101 mg/dL — ABNORMAL HIGH (ref 65–99)
Potassium: 4.8 mmol/L (ref 3.5–5.2)
Sodium: 137 mmol/L (ref 134–144)
Total Protein: 8.2 g/dL (ref 6.0–8.5)

## 2019-12-06 LAB — LIPID PANEL
Chol/HDL Ratio: 3.5 ratio (ref 0.0–4.4)
Cholesterol, Total: 129 mg/dL (ref 100–199)
HDL: 37 mg/dL — ABNORMAL LOW (ref >39)
LDL Chol Calc (NIH): 75 mg/dL (ref 0–99)
Triglycerides: 87 mg/dL (ref 0–149)
VLDL Cholesterol Cal: 17 mg/dL (ref 5–40)

## 2019-12-06 LAB — HEMOGLOBIN A1C
Est. average glucose Bld gHb Est-mCnc: 140 mg/dL
Hgb A1c MFr Bld: 6.5 % — ABNORMAL HIGH (ref 4.8–5.6)

## 2019-12-06 LAB — VITAMIN B12: Vitamin B-12: 926 pg/mL (ref 232–1245)

## 2019-12-06 NOTE — Telephone Encounter (Signed)
Patient stated that [facility] pharmacy in [place] called her and stated they have it listed that she is allergic to gabapentin( not in our record or allergy list) Patient states she has no recollection if she was or not or the reaction and they told her they would need a letter from the doctor for for they could fill the medication.

## 2019-12-07 ENCOUNTER — Encounter: Payer: Self-pay | Admitting: Family Medicine

## 2019-12-07 NOTE — Telephone Encounter (Signed)
Letter was dictated, please print, please give to the patient or faxed to the pharmacy what ever works best thank you

## 2019-12-08 NOTE — Telephone Encounter (Signed)
Called pt and she would like to pick up letter. Letter up front for pickup.

## 2019-12-14 ENCOUNTER — Other Ambulatory Visit: Payer: Self-pay | Admitting: Family Medicine

## 2019-12-14 DIAGNOSIS — Z1321 Encounter for screening for nutritional disorder: Secondary | ICD-10-CM

## 2019-12-14 DIAGNOSIS — Z1231 Encounter for screening mammogram for malignant neoplasm of breast: Secondary | ICD-10-CM

## 2019-12-15 ENCOUNTER — Ambulatory Visit: Payer: Medicare HMO | Admitting: Family Medicine

## 2019-12-19 ENCOUNTER — Telehealth: Payer: Self-pay | Admitting: Family Medicine

## 2019-12-19 DIAGNOSIS — Z113 Encounter for screening for infections with a predominantly sexual mode of transmission: Secondary | ICD-10-CM

## 2019-12-19 NOTE — Telephone Encounter (Signed)
Nurses Please add HIV antibody, VDRL to the previous labs  As for GC chlamydia please talk with the patient if she is interested in screening for this this can be done through a urine specimen if she desires that please order that as well thank you

## 2019-12-19 NOTE — Telephone Encounter (Signed)
Pt has upcoming appt and upcoming labs. Pt would like STD testing added to recent lab orders. Please advise. Thank you

## 2019-12-19 NOTE — Telephone Encounter (Signed)
Left message to return call  (VDRL comes up only through [facility]; need to make sure that pt is OK with going to [facility])

## 2019-12-20 DIAGNOSIS — R69 Illness, unspecified: Secondary | ICD-10-CM | POA: Diagnosis not present

## 2019-12-20 NOTE — Telephone Encounter (Signed)
Pt returned call and verbalized understanding. Pt will come to office and pick up lab orders.

## 2019-12-20 NOTE — Addendum Note (Signed)
Addended by: Marlowe Shores on: 12/20/2019 11:45 AM   Modules accepted: Orders

## 2019-12-27 ENCOUNTER — Encounter (INDEPENDENT_AMBULATORY_CARE_PROVIDER_SITE_OTHER): Payer: Medicare HMO | Admitting: Ophthalmology

## 2019-12-27 DIAGNOSIS — R69 Illness, unspecified: Secondary | ICD-10-CM | POA: Diagnosis not present

## 2019-12-28 LAB — HIV ANTIBODY (ROUTINE TESTING W REFLEX): HIV Screen 4th Generation wRfx: NONREACTIVE

## 2019-12-28 LAB — RPR QUALITATIVE: RPR Ser Ql: NONREACTIVE

## 2019-12-30 LAB — GC/CHLAMYDIA PROBE AMP
Chlamydia trachomatis, NAA: NEGATIVE
Neisseria Gonorrhoeae by PCR: NEGATIVE

## 2020-01-02 ENCOUNTER — Other Ambulatory Visit: Payer: Self-pay

## 2020-01-02 ENCOUNTER — Ambulatory Visit (INDEPENDENT_AMBULATORY_CARE_PROVIDER_SITE_OTHER): Payer: Medicare HMO | Admitting: Family Medicine

## 2020-01-02 ENCOUNTER — Encounter: Payer: Self-pay | Admitting: Family Medicine

## 2020-01-02 VITALS — BP 102/76 | HR 92 | Temp 97.5°F | Wt 209.6 lb

## 2020-01-02 DIAGNOSIS — Z1329 Encounter for screening for other suspected endocrine disorder: Secondary | ICD-10-CM

## 2020-01-02 DIAGNOSIS — R5383 Other fatigue: Secondary | ICD-10-CM

## 2020-01-02 DIAGNOSIS — F321 Major depressive disorder, single episode, moderate: Secondary | ICD-10-CM | POA: Diagnosis not present

## 2020-01-02 DIAGNOSIS — G43019 Migraine without aura, intractable, without status migrainosus: Secondary | ICD-10-CM | POA: Diagnosis not present

## 2020-01-02 DIAGNOSIS — E119 Type 2 diabetes mellitus without complications: Secondary | ICD-10-CM

## 2020-01-02 DIAGNOSIS — I10 Essential (primary) hypertension: Secondary | ICD-10-CM

## 2020-01-02 DIAGNOSIS — I959 Hypotension, unspecified: Secondary | ICD-10-CM | POA: Diagnosis not present

## 2020-01-02 DIAGNOSIS — R69 Illness, unspecified: Secondary | ICD-10-CM | POA: Diagnosis not present

## 2020-01-02 MED ORDER — FLUOXETINE HCL 20 MG PO TABS: 20.0000 mg | ORAL_TABLET | Freq: Every day | ORAL | 3 refills | Status: DC

## 2020-01-02 MED ORDER — LOSARTAN POTASSIUM 25 MG PO TABS: 25.0000 mg | ORAL_TABLET | Freq: Every day | ORAL | 5 refills | Status: DC

## 2020-01-02 NOTE — Progress Notes (Signed)
Subjective:    Patient ID: [name], female    DOB: 01/30/1966, 54 y.o.   MRN: [id number]  Patient following up after starting gabapentin for neuropathy and headaches. Patient has been experiencing dizziness, nausea and headaches and feels this is coming from the increased dose of losartan and gabapentin.  Intractable migraine without aura and without status migrainosus - Plan: Ambulatory referral to Neurology  Primary hypertension - Plan: Basic metabolic panel, PTH, intact and calcium  Hypotension, unspecified hypotension type  Depression, major, single episode, moderate (HCC)  Diabetes mellitus without complication (HCC) - Plan: Hemoglobin A1c  Screening for thyroid disorder  Patient feeling very fatigued tired dizzy feels lightheaded when she stands up recently cut back on the losartan on her own and cut back on the gabapentin She finds her self feeling fatigued and tired Also still having frequent headaches using the medication from the neurologist and plans on following up in the near future She is also dealing with major depression finds her self feeling anxious nervous sad on a frequent basis    Review of Systems  Constitutional: Positive for fatigue. Negative for activity change and appetite change.  HENT: Negative for congestion and rhinorrhea.   Respiratory: Negative for cough and shortness of breath.   Cardiovascular: Negative for chest pain and leg swelling.  Gastrointestinal: Negative for abdominal pain and diarrhea.  Endocrine: Negative for polydipsia and polyphagia.  Skin: Negative for color change.  Neurological: Positive for dizziness, light-headedness and headaches. Negative for weakness.  Psychiatric/Behavioral: Negative for behavioral problems and confusion.       Objective:   Physical Exam Vitals reviewed.  Constitutional:      General: She is not in acute distress. HENT:     Head: Normocephalic and atraumatic.  Eyes:     General:          Right eye: No discharge.        Left eye: No discharge.  Neck:     Trachea: No tracheal deviation.  Cardiovascular:     Rate and Rhythm: Normal rate and regular rhythm.     Heart sounds: Normal heart sounds. No murmur heard.   Pulmonary:     Effort: Pulmonary effort is normal. No respiratory distress.     Breath sounds: Normal breath sounds.  Lymphadenopathy:     Cervical: No cervical adenopathy.  Skin:    General: Skin is warm and dry.  Neurological:     Mental Status: She is alert.     Coordination: Coordination normal.  Psychiatric:        Behavior: Behavior normal.           Assessment & Plan:  1. Intractable migraine without aura and without status migrainosus Referral back to neurology see Dr.Ahern hopefully they will be able to improve the frequency of her headaches but some of the headaches could be related to the depression - Ambulatory referral to Neurology  2. Primary hypertension Blood pressure overcorrected reduce losartan new dose 25 mg continue other medicines as this - Basic metabolic panel - PTH, intact and calcium  3. Hypotension, unspecified hypotension type Low blood pressure because of aggressive dosing of losartan reduce it to 25 mg  4. Depression, major, single episode, moderate (HCC) Major depression stop Wellbutrin start Prozac follow-up again in 4 weeks time sooner problems patient not suicidal Patient not suicidal if she becomes worse she will reach out for help immediately here or ER 5. Diabetes mellitus without complication (HCC) Diabetes check A1c  watch diet stay active continue Victoza - Hemoglobin A1c  6. Screening for thyroid disorder Fatigue tiredness check thyroid function Hypercalcemia check PTH and metabolic 7 Fatigue check thyroid function Follow-up 4 weeks follow-up sooner problems

## 2020-01-09 DIAGNOSIS — H3589 Other specified retinal disorders: Secondary | ICD-10-CM | POA: Diagnosis not present

## 2020-01-09 DIAGNOSIS — E11311 Type 2 diabetes mellitus with unspecified diabetic retinopathy with macular edema: Secondary | ICD-10-CM | POA: Diagnosis not present

## 2020-01-09 DIAGNOSIS — H52223 Regular astigmatism, bilateral: Secondary | ICD-10-CM | POA: Diagnosis not present

## 2020-01-09 DIAGNOSIS — E119 Type 2 diabetes mellitus without complications: Secondary | ICD-10-CM | POA: Diagnosis not present

## 2020-01-09 DIAGNOSIS — H5202 Hypermetropia, left eye: Secondary | ICD-10-CM | POA: Diagnosis not present

## 2020-01-09 DIAGNOSIS — Z794 Long term (current) use of insulin: Secondary | ICD-10-CM | POA: Diagnosis not present

## 2020-01-09 DIAGNOSIS — H5211 Myopia, right eye: Secondary | ICD-10-CM | POA: Diagnosis not present

## 2020-01-09 DIAGNOSIS — H524 Presbyopia: Secondary | ICD-10-CM | POA: Diagnosis not present

## 2020-01-10 ENCOUNTER — Telehealth: Payer: Self-pay | Admitting: Neurology

## 2020-01-10 ENCOUNTER — Other Ambulatory Visit: Payer: Self-pay | Admitting: Neurology

## 2020-01-10 MED ORDER — AJOVY 225 MG/1.5ML ~~LOC~~ SOAJ: 225.0000 mg | SUBCUTANEOUS | 4 refills | Status: DC

## 2020-01-10 NOTE — Telephone Encounter (Signed)
If patient has been seen before for the same issue they are referred for then they can see an NP. I don;t know why she was referred again I just sat her in March and I will refill her Ajovy thanks

## 2020-01-10 NOTE — Telephone Encounter (Signed)
Patient has been referred back to [place] for the migraines and when I spoke with her she stated she's out of her Ajovy and was wanting to know if she could get a refill. She also mentioned the Ajovy has been working, but she's noticed an increase in headache frequency. Would you like me schedule a follow up with you or one of the NPs for this?

## 2020-01-15 NOTE — Progress Notes (Deleted)
No chief complaint on file.    HISTORY OF PRESENT ILLNESS: Today 01/15/20  [name] is a 54 y.o. female here today for follow up for migraines. She continues Ajovy. She is also taking metoprolol 37m BID for HTN and gabapentin 1030mTID for neuropathy managed by PCP.    She has a history of OSA. HST was ordered by PCP 12/05/2019.    HISTORY (copied from previous note)  HPI:  [name] a [age].o. female here as requested by [name] migraines.  She has a past medical history of sleep apnea, migraines, lumbar spondylosis, IBS, hypertension, GERD, diabetes, chronic pain syndrome and chronic low back pain, arthritis, anxiety, chronic cough.  I reviewed [employer], patient reports aura, headache, photophobia, phonophobia, nausea and vomiting, no vision loss, headache located in the entire head, radiates to the entire head and neck, pulsating throbbing sharp dull achy and knifelike.  She has had these for 40 years and they occur constantly and they last for 24 hours, severe and worsening.  Migraine triggers include fatigue and sleep deprivation.  Patient reports relief with Tylenol or NSAIDs, triptans, rest, sleep quiet cool room in darkened room.  She has associated vertigo, none associated paresthesias or autonomic symptoms.  She is on Topamax.  She is currently able to do activities of daily living without limitations.  By report there is poor compliance with treatment.  She is reported nothing works.  She was evaluated years ago that headache center.  I also reviewed lab work which included low vitamin D at 20, ANA negative, rheumatoid factor within normal limits.   She has had migraines since the age of [age]getting more persistent, she had one recently for 6 days, she has light and sound sensitivity, nausea, vomiting, pounding/pulsating/throbbing, aching, it can start unilaterally and spread to the whole head even in the back, she is having  daily headaches and at least 15 moderately severe to severe migraine days. Laying down helps. It can be severe, for 6 days it was terrible, she couldn't eat. She wakes up with headaches, they are worse in the morning and are positional, she has blurry vision, daily migraines and headaches, worsening, unknown triggers, ongoing for over a year or more with worsening and exacerbations. Also associated dizziness,fatigue. No other focal neurologic deficits, associated symptoms, inciting events or modifiable factors.    Reviewed notes, labs and imaging from outside physicians, which showed:  Medications tried include Cymbalta, Topamax, diclofenac, Tylenol, NSAIDs, metoprolol, Robaxin, Toradol injections, losartan, injections of Depo-Medrol, Zofran injections and disintegrating tablet, prednisone tablets, promethazine 25 mg tablets, trazodone, tramadol, Topamax up to 200 mg a day  TSH normal     REVIEW OF SYSTEMS: Out of a complete 14 system review of symptoms, the patient complains only of the following symptoms, and all other reviewed systems are negative.   ALLERGIES: Allergies  Allergen Reactions  . Bactrim [Sulfamethoxazole-Trimethoprim] Rash  . Lisinopril     Dry cough  . Other     Seasonal allergies  . Metformin And Related     Felt sick and nervous     HOME MEDICATIONS: Outpatient Medications Prior to Visit  Medication Sig Dispense Refill  . albuterol (PROVENTIL HFA;VENTOLIN HFA) 108 (90 Base) MCG/ACT inhaler Inhale 2 puffs into the lungs every 4 (four) hours as needed. 1 Inhaler 5  . amLODipine (NORVASC) 5 MG tablet Take 1 tablet (5 mg total) by mouth daily. 90 tablet 1  . Ascorbic  Acid (VITAMIN C PO) Take by mouth.    . blood glucose meter kit and supplies Dispense based on patient and insurance preference. Use to test sugar once a day. For dx E11.9 1 each 11  . famotidine (PEPCID) 20 MG tablet Take 1 tablet (20 mg total) by mouth 2 (two) times daily. 60 tablet 5  .  FLUoxetine (PROZAC) 20 MG tablet Take 1 tablet (20 mg total) by mouth daily. 30 tablet 3  . fluticasone (FLONASE) 50 MCG/ACT nasal spray Place 2 sprays into both nostrils daily as needed for allergies or rhinitis. 16 g 5  . fluticasone (FLOVENT HFA) 110 MCG/ACT inhaler Inhale 2 puffs into the lungs 2 (two) times daily. 1 Inhaler 5  . Fremanezumab-vfrm (AJOVY) 225 MG/1.5ML SOAJ Inject 225 mg into the skin every 30 (thirty) days. 3 pen 4  . Fremanezumab-vfrm (AJOVY) 225 MG/1.5ML SOAJ Inject 225 mg into the skin every 30 (thirty) days. 4.5 mL 4  . gabapentin (NEURONTIN) 100 MG capsule Take 1 capsule (100 mg total) by mouth 3 (three) times daily. 270 capsule 0  . losartan (COZAAR) 25 MG tablet Take 1 tablet (25 mg total) by mouth daily. 30 tablet 5  . meloxicam (MOBIC) 15 MG tablet Take 1 tablet (15 mg total) by mouth daily. 90 tablet 0  . metoprolol tartrate (LOPRESSOR) 50 MG tablet Take 1 tablet (50 mg total) by mouth 2 (two) times daily. 180 tablet 1  . Multiple Vitamins-Minerals (MULTIPLE VITAMINS/WOMENS PO) Take by mouth.    . ondansetron (ZOFRAN ODT) 8 MG disintegrating tablet Take 1 tablet (8 mg total) by mouth every 8 (eight) hours as needed for nausea or vomiting. 18 tablet 4  . RELION PEN NEEDLES 31G X 6 MM MISC AS DIRECTED FOR  VICTOZA  PEN 100 each 0  . rosuvastatin (CRESTOR) 5 MG tablet TAKE 1 TABLET BY MOUTH ONCE DAILY FOR CHOLESTEROL 90 tablet 1  . triamterene-hydrochlorothiazide (MAXZIDE-25) 37.5-25 MG tablet Take 1 tablet by mouth daily. 90 tablet 1  . VICTOZA 18 MG/3ML SOPN INJECT 0.3MLS (1.8MG TOTAL) INTO THE SKIN DAILY 27 mL 5  . VITAMIN D PO Take by mouth.     No facility-administered medications prior to visit.     PAST MEDICAL HISTORY: Past Medical History:  Diagnosis Date  . Allergy   . Anxiety   . Arthritis   . Chronic cough   . Chronic low back pain   . Chronic pain syndrome   . Diabetes mellitus without complication ([place])   . Folliculitis 09/22/4816  . GERD  (gastroesophageal reflux disease)   . Hidradenitis suppurativa   . Hypertension   . IBS (irritable bowel syndrome)   . Lumbar spondylolysis   . Migraine headache   . Pulmonary nodule   . Reactive airway disease 01/2018   per PFT's  . Sleep apnea      PAST SURGICAL HISTORY: Past Surgical History:  Procedure Laterality Date  . ABDOMINAL HYSTERECTOMY    . COLONOSCOPY WITH PROPOFOL N/A 04/08/2015   Dr. Gala Romney: normal, repeat in 10 years   . ENDOMETRIAL ABLATION    . FOREIGN BODY REMOVAL N/A 05/26/2013   Procedure: FOREIGN BODY REMOVAL ADULT ABDOMINAL WALL;  Surgeon: Jamesetta So, MD;  Location: [facility];  Service: General;  Laterality: N/A;  . KNEE SURGERY Right 2012  . PILONIDAL CYST EXCISION  June 2011  . TUBAL LIGATION       FAMILY HISTORY: Family History  Problem Relation Age of Onset  . Stroke  Mother   . Hypertension Mother   . Diabetes Mother   . Hypercholesterolemia Mother   . Depression Mother   . Anxiety disorder Mother   . Thyroid disease Father   . Diabetes Father   . Hypertension Sister   . Hypertension Brother   . Cancer Paternal Grandfather        prostate  . Colon cancer Maternal Grandfather   . Healthy Daughter   . Migraines Neg Hx      SOCIAL HISTORY: Social History   Socioeconomic History  . Marital status: Single    Spouse name: Not on file  . Number of children: 1  . Years of education: Not on file  . Highest education level: High school graduate  Occupational History  . Occupation: unemployed  Tobacco Use  . Smoking status: Never Smoker  . Smokeless tobacco: Never Used  Vaping Use  . Vaping Use: Never used  Substance and Sexual Activity  . Alcohol use: No    Alcohol/week: 0.0 standard drinks  . Drug use: No  . Sexual activity: Not Currently    Birth control/protection: Surgical  Other Topics Concern  . Not on file  Social History Narrative   Lives alone   Right handed   Caffeine: maybe 1 cup of tea if she goes out to dinner. "I  don't really do caffeine".   Social Determinants of Health   Financial Resource Strain:   . Difficulty of Paying Living Expenses: Not on file  Food Insecurity:   . Worried About Charity fundraiser in the Last Year: Not on file  . Ran Out of Food in the Last Year: Not on file  Transportation Needs:   . Lack of Transportation (Medical): Not on file  . Lack of Transportation (Non-Medical): Not on file  Physical Activity:   . Days of Exercise per Week: Not on file  . Minutes of Exercise per Session: Not on file  Stress:   . Feeling of Stress : Not on file  Social Connections:   . Frequency of Communication with Friends and Family: Not on file  . Frequency of Social Gatherings with Friends and Family: Not on file  . Attends Religious Services: Not on file  . Active Member of Clubs or Organizations: Not on file  . Attends Archivist Meetings: Not on file  . Marital Status: Not on file  Intimate Partner Violence:   . Fear of Current or Ex-Partner: Not on file  . Emotionally Abused: Not on file  . Physically Abused: Not on file  . Sexually Abused: Not on file      PHYSICAL EXAM  There were no vitals filed for this visit. There is no height or weight on file to calculate BMI.   Generalized: Well developed, in no acute distress  Cardiology: normal rate and rhythm, no murmur auscultated  Respiratory: clear to auscultation bilaterally    Neurological examination  Mentation: Alert oriented to time, place, history taking. Follows all commands speech and language fluent Cranial nerve II-XII: Pupils were equal round reactive to light. Extraocular movements were full, visual field were full on confrontational test. Facial sensation and strength were normal. Uvula tongue midline. Head turning and shoulder shrug  were normal and symmetric. Motor: The motor testing reveals 5 over 5 strength of all 4 extremities. Good symmetric motor tone is noted throughout.  Sensory: Sensory  testing is intact to soft touch on all 4 extremities. No evidence of extinction is noted.  Coordination:  Cerebellar testing reveals good finger-nose-finger and heel-to-shin bilaterally.  Gait and station: Gait is normal. Tandem gait is normal. Romberg is negative. No drift is seen.  Reflexes: Deep tendon reflexes are symmetric and normal bilaterally.     DIAGNOSTIC DATA (LABS, IMAGING, TESTING) - I reviewed patient records, labs, notes, testing and imaging myself where available.  Lab Results  Component Value Date   WBC 6.6 11/14/2019   HGB 12.7 11/14/2019   HCT 38.8 11/14/2019   MCV 91 11/14/2019   PLT 326 11/14/2019      Component Value Date/Time   NA 137 12/05/2019 1332   K 4.8 12/05/2019 1332   CL 96 12/05/2019 1332   CO2 27 12/05/2019 1332   GLUCOSE 101 (H) 12/05/2019 1332   GLUCOSE 112 (H) 04/16/2018 1310   BUN 15 12/05/2019 1332   CREATININE 0.94 12/05/2019 1332   CREATININE 0.85 07/07/2013 1122   CALCIUM 10.5 (H) 12/05/2019 1332   PROT 8.2 12/05/2019 1332   ALBUMIN 4.9 12/05/2019 1332   AST 16 12/05/2019 1332   ALT 12 12/05/2019 1332   ALKPHOS 77 12/05/2019 1332   BILITOT 0.4 12/05/2019 1332   GFRNONAA 68 12/05/2019 1332   GFRAA 78 12/05/2019 1332   Lab Results  Component Value Date   CHOL 129 12/05/2019   HDL 37 (L) 12/05/2019   LDLCALC 75 12/05/2019   TRIG 87 12/05/2019   CHOLHDL 3.5 12/05/2019   Lab Results  Component Value Date   HGBA1C 6.5 (H) 12/05/2019   Lab Results  Component Value Date   VITAMINB12 926 12/05/2019   Lab Results  Component Value Date   TSH 0.664 11/07/2019      ASSESSMENT AND PLAN  54 y.o. year old female  has a past medical history of Allergy, Anxiety, Arthritis, Chronic cough, Chronic low back pain, Chronic pain syndrome, Diabetes mellitus without complication ([place]), Folliculitis (5/79/7282), GERD (gastroesophageal reflux disease), Hidradenitis suppurativa, Hypertension, IBS (irritable bowel syndrome), Lumbar  spondylolysis, Migraine headache, Pulmonary nodule, Reactive airway disease (01/2018), and Sleep apnea. here with ***  No diagnosis found.   I spent 20 minutes of face-to-face and non-face-to-face time with patient.  This included previsit chart review, lab review, study review, order entry, electronic health record documentation, patient education.    Debbora Presto, MSN, FNP-C 01/15/2020, 4:50 PM  [facility] [address] [place], [place] [address] [phone]

## 2020-01-15 NOTE — Telephone Encounter (Signed)
[name] has an opening tomorrow. Referrals has reached out to pt and LVM.

## 2020-01-15 NOTE — Telephone Encounter (Signed)
I see the pt called and accepted appt tomorrow with Amy at 2:30 pm.

## 2020-01-15 NOTE — Telephone Encounter (Signed)
I called the patient again to follow-up. I had to LVM asking for call back asap to get the patient scheduled and let her know I was calling about an opening tomorrow that is not guaranteed to stay open. Left office number in message for call back.

## 2020-01-16 ENCOUNTER — Institutional Professional Consult (permissible substitution): Payer: Medicare HMO | Admitting: Family Medicine

## 2020-01-16 DIAGNOSIS — G43709 Chronic migraine without aura, not intractable, without status migrainosus: Secondary | ICD-10-CM

## 2020-01-17 DIAGNOSIS — M1711 Unilateral primary osteoarthritis, right knee: Secondary | ICD-10-CM | POA: Diagnosis not present

## 2020-01-22 ENCOUNTER — Ambulatory Visit
Admission: RE | Admit: 2020-01-22 | Discharge: 2020-01-22 | Disposition: A | Discharge status: Home / Self Care | Payer: Medicare HMO | Source: Ambulatory Visit | Attending: Family Medicine | Admitting: Family Medicine

## 2020-01-22 ENCOUNTER — Other Ambulatory Visit: Payer: Self-pay

## 2020-01-22 DIAGNOSIS — Z1231 Encounter for screening mammogram for malignant neoplasm of breast: Secondary | ICD-10-CM

## 2020-01-23 ENCOUNTER — Encounter: Payer: Self-pay | Admitting: Family Medicine

## 2020-01-23 ENCOUNTER — Ambulatory Visit (INDEPENDENT_AMBULATORY_CARE_PROVIDER_SITE_OTHER): Payer: Medicare HMO | Admitting: Family Medicine

## 2020-01-23 VITALS — BP 118/74 | HR 99 | Temp 97.6°F | Wt 214.2 lb

## 2020-01-23 DIAGNOSIS — I10 Essential (primary) hypertension: Secondary | ICD-10-CM | POA: Diagnosis not present

## 2020-01-23 DIAGNOSIS — E119 Type 2 diabetes mellitus without complications: Secondary | ICD-10-CM | POA: Diagnosis not present

## 2020-01-23 DIAGNOSIS — F321 Major depressive disorder, single episode, moderate: Secondary | ICD-10-CM | POA: Diagnosis not present

## 2020-01-23 DIAGNOSIS — G43019 Migraine without aura, intractable, without status migrainosus: Secondary | ICD-10-CM

## 2020-01-23 DIAGNOSIS — R69 Illness, unspecified: Secondary | ICD-10-CM | POA: Diagnosis not present

## 2020-01-23 NOTE — Progress Notes (Signed)
   Subjective:    Patient ID: [name], female    DOB: 06/11/1965, 54 y.o.   MRN: [id number]  HPI Patient following up on migraines and fatigue. Patient reports only minimal improvement in fatigue and still experiencing migraines frequently.  Neurology appointment was pushed back to January.  Patient forgot to have blood work completed prior to appointment. She states she states that she will go ahead and go today to get this done  She relates that she has headaches very frequently at least every other day frontal sometimes top of the head sometimes in the neck does not radiate down the arms throbbing nausea photophobia last several hours a day and then gets better she is scheduled to see neurology coming up  Her moods are doing much better since starting on the medicine she feels more positive about things  Review of Systems  Constitutional: Negative for activity change and appetite change.  HENT: Negative for congestion and rhinorrhea.   Respiratory: Negative for cough and shortness of breath.   Cardiovascular: Negative for chest pain and leg swelling.  Gastrointestinal: Negative for abdominal pain, nausea and vomiting.  Skin: Negative for color change.  Neurological: Negative for dizziness and weakness.  Psychiatric/Behavioral: Negative for agitation and confusion.       Objective:   Physical Exam Vitals reviewed.  Constitutional:      General: She is not in acute distress.    Appearance: She is well-nourished.  HENT:     Head: Normocephalic.  Cardiovascular:     Rate and Rhythm: Normal rate and regular rhythm.     Heart sounds: Normal heart sounds. No murmur heard.   Pulmonary:     Effort: Pulmonary effort is normal.     Breath sounds: Normal breath sounds.  Musculoskeletal:        General: No edema.  Lymphadenopathy:     Cervical: No cervical adenopathy.  Neurological:     Mental Status: She is alert.  Psychiatric:        Behavior: Behavior normal.            Assessment & Plan:  Blood pressure continue current measures watch salt stay active  Frequent migraines I gave her a headache diary to keep track of she is to show this to the neurologist when she goes  Depression improving with Prozac continue this  Follow-up in 3 to 4 months  Patient also states she is following up with ophthalmology regarding something going on with her retinas we will see how her A1c is doing.  Patient to do her lab work today

## 2020-01-23 NOTE — Patient Instructions (Signed)

## 2020-01-24 ENCOUNTER — Other Ambulatory Visit: Payer: Self-pay

## 2020-01-24 ENCOUNTER — Encounter (INDEPENDENT_AMBULATORY_CARE_PROVIDER_SITE_OTHER): Payer: Medicare HMO | Admitting: Ophthalmology

## 2020-01-24 DIAGNOSIS — I10 Essential (primary) hypertension: Secondary | ICD-10-CM

## 2020-01-24 DIAGNOSIS — H2513 Age-related nuclear cataract, bilateral: Secondary | ICD-10-CM | POA: Diagnosis not present

## 2020-01-24 DIAGNOSIS — M1711 Unilateral primary osteoarthritis, right knee: Secondary | ICD-10-CM | POA: Diagnosis not present

## 2020-01-24 DIAGNOSIS — H35033 Hypertensive retinopathy, bilateral: Secondary | ICD-10-CM

## 2020-01-24 DIAGNOSIS — H43813 Vitreous degeneration, bilateral: Secondary | ICD-10-CM | POA: Diagnosis not present

## 2020-01-24 LAB — BASIC METABOLIC PANEL
BUN/Creatinine Ratio: 19 (ref 9–23)
BUN: 20 mg/dL (ref 6–24)
CO2: 29 mmol/L (ref 20–29)
Calcium: 10.7 mg/dL — ABNORMAL HIGH (ref 8.7–10.2)
Chloride: 101 mmol/L (ref 96–106)
Creatinine, Ser: 1.06 mg/dL — ABNORMAL HIGH (ref 0.57–1.00)
GFR calc Af Amer: 68 mL/min/{1.73_m2} (ref >59)
GFR calc non Af Amer: 59 mL/min/{1.73_m2} — ABNORMAL LOW (ref >59)
Glucose: 94 mg/dL (ref 65–99)
Potassium: 4.5 mmol/L (ref 3.5–5.2)
Sodium: 143 mmol/L (ref 134–144)

## 2020-01-24 LAB — PTH, INTACT AND CALCIUM: PTH: 27 pg/mL (ref 15–65)

## 2020-01-24 LAB — HEMOGLOBIN A1C
Est. average glucose Bld gHb Est-mCnc: 151 mg/dL
Hgb A1c MFr Bld: 6.9 % — ABNORMAL HIGH (ref 4.8–5.6)

## 2020-01-30 ENCOUNTER — Other Ambulatory Visit: Payer: Self-pay

## 2020-01-30 ENCOUNTER — Ambulatory Visit: Payer: Medicare HMO | Admitting: Family Medicine

## 2020-01-30 ENCOUNTER — Other Ambulatory Visit: Payer: Self-pay | Admitting: *Deleted

## 2020-01-30 DIAGNOSIS — R69 Illness, unspecified: Secondary | ICD-10-CM | POA: Diagnosis not present

## 2020-01-30 MED ORDER — RELION PEN NEEDLES 31G X 6 MM MISC
0 refills | Status: DC
Start: 2020-01-30 — End: 2020-08-09

## 2020-01-31 DIAGNOSIS — M1712 Unilateral primary osteoarthritis, left knee: Secondary | ICD-10-CM | POA: Diagnosis not present

## 2020-01-31 DIAGNOSIS — M1711 Unilateral primary osteoarthritis, right knee: Secondary | ICD-10-CM | POA: Diagnosis not present

## 2020-02-08 LAB — VITAMIN D 1,25 DIHYDROXY
Vitamin D 1, 25 (OH)2 Total: 63 pg/mL
Vitamin D2 1, 25 (OH)2: <10 pg/mL
Vitamin D3 1, 25 (OH)2: 61 pg/mL

## 2020-02-08 LAB — PTH-RELATED PEPTIDE: PTH-related peptide: <2 pmol/L

## 2020-02-08 LAB — CALCIUM, IONIZED: Calcium, Ion: 5.2 mg/dL (ref 4.5–5.6)

## 2020-02-08 LAB — VITAMIN D 25 HYDROXY (VIT D DEFICIENCY, FRACTURES): Vit D, 25-Hydroxy: 40.9 ng/mL (ref 30.0–100.0)

## 2020-02-13 ENCOUNTER — Telehealth: Payer: Medicare HMO | Admitting: Neurology

## 2020-02-23 DIAGNOSIS — Z1152 Encounter for screening for COVID-19: Secondary | ICD-10-CM | POA: Diagnosis not present

## 2020-03-12 ENCOUNTER — Telehealth: Payer: Self-pay

## 2020-03-12 NOTE — Telephone Encounter (Signed)
Received a request for Ajovy.  Completed via CMM.  Per Dr. Darreld Mclean last note, patient has a history of noncompliance and therefore oral meds will likely be ineffective.  She recommended a once monthly injection.  PA sent to [facility]. (Key: [id number]).  Should have a determination within 1-3 business days.

## 2020-03-12 NOTE — Telephone Encounter (Signed)
Her Ajovy was approved from February 10, 2020 to February 08, 2021 by [employer].

## 2020-04-24 ENCOUNTER — Other Ambulatory Visit: Payer: Self-pay | Admitting: Family Medicine

## 2020-04-24 ENCOUNTER — Ambulatory Visit: Payer: Medicare HMO | Admitting: Family Medicine

## 2020-04-24 NOTE — Telephone Encounter (Signed)
Needs to do lipid A1c urine ACR met 7 Diabetes hyperlipidemia hypercalcemia May have 90-day on all prescriptions keep follow-up visit later this month

## 2020-04-24 NOTE — Telephone Encounter (Signed)
Last seen 01/23/20. Has upcoming appt 05/07/20

## 2020-04-25 ENCOUNTER — Other Ambulatory Visit: Payer: Self-pay | Admitting: *Deleted

## 2020-04-25 DIAGNOSIS — E785 Hyperlipidemia, unspecified: Secondary | ICD-10-CM

## 2020-04-25 DIAGNOSIS — E119 Type 2 diabetes mellitus without complications: Secondary | ICD-10-CM

## 2020-04-25 DIAGNOSIS — I959 Hypotension, unspecified: Secondary | ICD-10-CM

## 2020-04-25 NOTE — Telephone Encounter (Signed)
Pt notified that bw orders are put in and she is requesting  a 90 day supply of losartan and meloxicam also. She also requested to start on med for anxiety and depression. No sucidal thoughts. I told her she would need a office visit to discuss. States she will discuss at her visit on 3/29. Advised er if she was to start having any sucidal thoughts.

## 2020-04-25 NOTE — Telephone Encounter (Signed)
Orders put in and left message to return call with pt 

## 2020-04-26 ENCOUNTER — Other Ambulatory Visit: Payer: Self-pay | Admitting: *Deleted

## 2020-04-26 MED ORDER — LOSARTAN POTASSIUM 25 MG PO TABS: 25.0000 mg | ORAL_TABLET | Freq: Every day | ORAL | 0 refills | Status: DC

## 2020-04-26 MED ORDER — MELOXICAM 15 MG PO TABS: 15.0000 mg | ORAL_TABLET | Freq: Every day | ORAL | 0 refills | Status: DC

## 2020-04-26 NOTE — Telephone Encounter (Signed)
Tried to call and no answer to discuss with pt and give her the phone number behavioral health urgent care [phone]. [address] [place].

## 2020-04-26 NOTE — Telephone Encounter (Signed)
Pt given the information on walk in clinic below in message. And refills sent.

## 2020-04-26 NOTE — Telephone Encounter (Signed)
She may have a refill on the losartan and meloxicam 90-day as requested May have the other medicines as requested Needs to do an office visit before starting any type of antidepressant medicine Give her information regarding 24-hour urgent care for behavioral health in [place] if she wants to be seen sooner unfortunately our schedule unable to see him sooner thank you

## 2020-05-03 DIAGNOSIS — I959 Hypotension, unspecified: Secondary | ICD-10-CM | POA: Diagnosis not present

## 2020-05-03 DIAGNOSIS — E785 Hyperlipidemia, unspecified: Secondary | ICD-10-CM | POA: Diagnosis not present

## 2020-05-03 DIAGNOSIS — E119 Type 2 diabetes mellitus without complications: Secondary | ICD-10-CM | POA: Diagnosis not present

## 2020-05-04 LAB — BASIC METABOLIC PANEL
BUN/Creatinine Ratio: 22 (ref 9–23)
BUN: 19 mg/dL (ref 6–24)
CO2: 24 mmol/L (ref 20–29)
Calcium: 10.2 mg/dL (ref 8.7–10.2)
Chloride: 96 mmol/L (ref 96–106)
Creatinine, Ser: 0.88 mg/dL (ref 0.57–1.00)
Glucose: 100 mg/dL — ABNORMAL HIGH (ref 65–99)
Potassium: 4.7 mmol/L (ref 3.5–5.2)
Sodium: 138 mmol/L (ref 134–144)
eGFR: 77 mL/min/{1.73_m2} (ref >59)

## 2020-05-04 LAB — LIPID PANEL
Chol/HDL Ratio: 3 ratio (ref 0.0–4.4)
Cholesterol, Total: 125 mg/dL (ref 100–199)
HDL: 41 mg/dL (ref >39)
LDL Chol Calc (NIH): 68 mg/dL (ref 0–99)
Triglycerides: 78 mg/dL (ref 0–149)
VLDL Cholesterol Cal: 16 mg/dL (ref 5–40)

## 2020-05-04 LAB — MICROALBUMIN / CREATININE URINE RATIO
Creatinine, Urine: 80.3 mg/dL
Microalb/Creat Ratio: 5 mg/g creat (ref 0–29)
Microalbumin, Urine: 3.9 ug/mL

## 2020-05-04 LAB — HEMOGLOBIN A1C
Est. average glucose Bld gHb Est-mCnc: 140 mg/dL
Hgb A1c MFr Bld: 6.5 % — ABNORMAL HIGH (ref 4.8–5.6)

## 2020-05-07 ENCOUNTER — Other Ambulatory Visit: Payer: Self-pay

## 2020-05-07 ENCOUNTER — Ambulatory Visit (HOSPITAL_COMMUNITY)
Admission: RE | Admit: 2020-05-07 | Discharge: 2020-05-07 | Disposition: A | Discharge status: Home / Self Care | Payer: Medicare HMO | Source: Ambulatory Visit | Attending: Family Medicine | Admitting: Family Medicine

## 2020-05-07 ENCOUNTER — Ambulatory Visit (INDEPENDENT_AMBULATORY_CARE_PROVIDER_SITE_OTHER): Payer: Medicare HMO | Admitting: Family Medicine

## 2020-05-07 VITALS — BP 110/60 | HR 63 | Temp 97.4°F | Ht 65.0 in | Wt 221.0 lb

## 2020-05-07 DIAGNOSIS — M545 Low back pain, unspecified: Secondary | ICD-10-CM

## 2020-05-07 DIAGNOSIS — R059 Cough, unspecified: Secondary | ICD-10-CM | POA: Diagnosis not present

## 2020-05-07 DIAGNOSIS — R0600 Dyspnea, unspecified: Secondary | ICD-10-CM

## 2020-05-07 DIAGNOSIS — R0602 Shortness of breath: Secondary | ICD-10-CM | POA: Diagnosis not present

## 2020-05-07 DIAGNOSIS — R69 Illness, unspecified: Secondary | ICD-10-CM | POA: Diagnosis not present

## 2020-05-07 DIAGNOSIS — E782 Mixed hyperlipidemia: Secondary | ICD-10-CM | POA: Insufficient documentation

## 2020-05-07 DIAGNOSIS — E1169 Type 2 diabetes mellitus with other specified complication: Secondary | ICD-10-CM

## 2020-05-07 DIAGNOSIS — F321 Major depressive disorder, single episode, moderate: Secondary | ICD-10-CM | POA: Diagnosis not present

## 2020-05-07 DIAGNOSIS — G8929 Other chronic pain: Secondary | ICD-10-CM | POA: Diagnosis not present

## 2020-05-07 DIAGNOSIS — E785 Hyperlipidemia, unspecified: Secondary | ICD-10-CM

## 2020-05-07 DIAGNOSIS — R079 Chest pain, unspecified: Secondary | ICD-10-CM | POA: Diagnosis not present

## 2020-05-07 MED ORDER — ALBUTEROL SULFATE HFA 108 (90 BASE) MCG/ACT IN AERS: 2.0000 | INHALATION_SPRAY | RESPIRATORY_TRACT | 5 refills | Status: DC | PRN

## 2020-05-07 MED ORDER — FAMOTIDINE 20 MG PO TABS: 20.0000 mg | ORAL_TABLET | Freq: Two times a day (BID) | ORAL | 5 refills | Status: DC

## 2020-05-07 MED ORDER — AMLODIPINE BESYLATE 5 MG PO TABS: 5.0000 mg | ORAL_TABLET | Freq: Every day | ORAL | 1 refills | Status: DC

## 2020-05-07 MED ORDER — FLUOXETINE HCL 20 MG PO TABS: ORAL_TABLET | ORAL | 5 refills | Status: DC

## 2020-05-07 NOTE — Patient Instructions (Signed)
Diabetes Mellitus and Nutrition, Adult When you have diabetes, or diabetes mellitus, it is very important to have healthy eating habits because your blood sugar (glucose) levels are greatly affected by what you eat and drink. Eating healthy foods in the right amounts, at about the same times every day, can help you:  Control your blood glucose.  Lower your risk of heart disease.  Improve your blood pressure.  Reach or maintain a healthy weight. What can affect my meal plan? Every person with diabetes is different, and each person has different needs for a meal plan. Your health care provider may recommend that you work with a dietitian to make a meal plan that is best for you. Your meal plan may vary depending on factors such as:  The calories you need.  The medicines you take.  Your weight.  Your blood glucose, blood pressure, and cholesterol levels.  Your activity level.  Other health conditions you have, such as heart or kidney disease. How do carbohydrates affect me? Carbohydrates, also called carbs, affect your blood glucose level more than any other type of food. Eating carbs naturally raises the amount of glucose in your blood. Carb counting is a method for keeping track of how many carbs you eat. Counting carbs is important to keep your blood glucose at a healthy level, especially if you use insulin or take certain oral diabetes medicines. It is important to know how many carbs you can safely have in each meal. This is different for every person. Your dietitian can help you calculate how many carbs you should have at each meal and for each snack. How does alcohol affect me? Alcohol can cause a sudden decrease in blood glucose (hypoglycemia), especially if you use insulin or take certain oral diabetes medicines. Hypoglycemia can be a life-threatening condition. Symptoms of hypoglycemia, such as sleepiness, dizziness, and confusion, are similar to symptoms of having too much  alcohol.  Do not drink alcohol if: ? Your health care provider tells you not to drink. ? You are pregnant, may be pregnant, or are planning to become pregnant.  If you drink alcohol: ? Do not drink on an empty stomach. ? Limit how much you use to:  0-1 drink a day for women.  0-2 drinks a day for men. ? Be aware of how much alcohol is in your drink. In the [place] one drink equals one 12 oz bottle of beer (355 mL), one 5 oz glass of wine (148 mL), or one 1 oz glass of hard liquor (44 mL). ? Keep yourself hydrated with water, diet soda, or unsweetened iced tea.  Keep in mind that regular soda, juice, and other mixers may contain a lot of sugar and must be counted as carbs. What are tips for following this plan? Reading food labels  Start by checking the serving size on the "Nutrition Facts" label of packaged foods and drinks. The amount of calories, carbs, fats, and other nutrients listed on the label is based on one serving of the item. Many items contain more than one serving per package.  Check the total grams (g) of carbs in one serving. You can calculate the number of servings of carbs in one serving by dividing the total carbs by 15. For example, if a food has 30 g of total carbs per serving, it would be equal to 2 servings of carbs.  Check the number of grams (g) of saturated fats and trans fats in one serving. Choose foods that have   a low amount or none of these fats.  Check the number of milligrams (mg) of salt (sodium) in one serving. Most people should limit total sodium intake to less than 2,300 mg per day.  Always check the nutrition information of foods labeled as "low-fat" or "nonfat." These foods may be higher in added sugar or refined carbs and should be avoided.  Talk to your dietitian to identify your daily goals for nutrients listed on the label. Shopping  Avoid buying canned, pre-made, or processed foods. These foods tend to be high in fat, sodium, and added  sugar.  Shop around the outside edge of the grocery store. This is where you will most often find fresh fruits and vegetables, bulk grains, fresh meats, and fresh dairy. Cooking  Use low-heat cooking methods, such as baking, instead of high-heat cooking methods like deep frying.  Cook using healthy oils, such as olive, canola, or sunflower oil.  Avoid cooking with butter, cream, or high-fat meats. Meal planning  Eat meals and snacks regularly, preferably at the same times every day. Avoid going long periods of time without eating.  Eat foods that are high in fiber, such as fresh fruits, vegetables, beans, and whole grains. Talk with your dietitian about how many servings of carbs you can eat at each meal.  Eat 4-6 oz (112-168 g) of lean protein each day, such as lean meat, chicken, fish, eggs, or tofu. One ounce (oz) of lean protein is equal to: ? 1 oz (28 g) of meat, chicken, or fish. ? 1 egg. ?  cup (62 g) of tofu.  Eat some foods each day that contain healthy fats, such as avocado, nuts, seeds, and fish.   What foods should I eat? Fruits Berries. Apples. Oranges. Peaches. Apricots. Plums. Grapes. Mango. Papaya. Pomegranate. Kiwi. Cherries. Vegetables Lettuce. Spinach. Leafy greens, including kale, chard, collard greens, and mustard greens. Beets. Cauliflower. Cabbage. Broccoli. Carrots. Green beans. Tomatoes. Peppers. Onions. Cucumbers. Brussels sprouts. Grains Whole grains, such as whole-wheat or whole-grain bread, crackers, tortillas, cereal, and pasta. Unsweetened oatmeal. Quinoa. Brown or wild rice. Meats and other proteins Seafood. Poultry without skin. Lean cuts of poultry and beef. Tofu. Nuts. Seeds. Dairy Low-fat or fat-free dairy products such as milk, yogurt, and cheese. The items listed above may not be a complete list of foods and beverages you can eat. Contact a dietitian for more information. What foods should I avoid? Fruits Fruits canned with  syrup. Vegetables Canned vegetables. Frozen vegetables with butter or cream sauce. Grains Refined white flour and flour products such as bread, pasta, snack foods, and cereals. Avoid all processed foods. Meats and other proteins Fatty cuts of meat. Poultry with skin. Breaded or fried meats. Processed meat. Avoid saturated fats. Dairy Full-fat yogurt, cheese, or milk. Beverages Sweetened drinks, such as soda or iced tea. The items listed above may not be a complete list of foods and beverages you should avoid. Contact a dietitian for more information. Questions to ask a health care provider  Do I need to meet with a diabetes educator?  Do I need to meet with a dietitian?  What number can I call if I have questions?  When are the best times to check my blood glucose? Where to find more information:  American Diabetes Association: diabetes.org  Academy of Nutrition and Dietetics: www.eatright.org  National Institute of Diabetes and Digestive and Kidney Diseases: www.niddk.nih.gov  Association of Diabetes Care and Education Specialists: www.diabeteseducator.org Summary  It is important to have healthy eating   habits because your blood sugar (glucose) levels are greatly affected by what you eat and drink.  A healthy meal plan will help you control your blood glucose and maintain a healthy lifestyle.  Your health care provider may recommend that you work with a dietitian to make a meal plan that is best for you.  Keep in mind that carbohydrates (carbs) and alcohol have immediate effects on your blood glucose levels. It is important to count carbs and to use alcohol carefully. This information is not intended to replace advice given to you by your health care provider. Make sure you discuss any questions you have with your health care provider. Document Revised: 01/03/2019 Document Reviewed: 01/03/2019 Elsevier Patient Education  2021 [employer].  

## 2020-05-07 NOTE — Progress Notes (Signed)
Subjective:    Patient ID: [name], female    DOB: 10-27-1965, 55 y.o.   MRN: [id number]  Diabetes She presents for her follow-up diabetic visit. She has type 2 diabetes mellitus. Her disease course has been stable. Pertinent negatives for hypoglycemia include no confusion or dizziness. Pertinent negatives for diabetes include no chest pain and no weakness. Current diabetic treatments: victoza.  Migraine  Pertinent negatives include no abdominal pain, coughing, dizziness, nausea, rhinorrhea, vomiting or weakness.   Type 2 diabetes mellitus with other specified complication, without long-term current use of insulin (HCC)  Hyperlipidemia associated with type 2 diabetes mellitus ([place])  Chronic bilateral low back pain, unspecified whether sciatica present  Depression, major, single episode, moderate (HCC)     Review of Systems  Constitutional: Negative for activity change and appetite change.  HENT: Negative for congestion and rhinorrhea.   Respiratory: Positive for shortness of breath. Negative for cough.   Cardiovascular: Negative for chest pain and leg swelling.  Gastrointestinal: Negative for abdominal pain, nausea and vomiting.  Skin: Negative for color change.  Neurological: Negative for dizziness and weakness.  Psychiatric/Behavioral: Negative for agitation and confusion.       Objective:   Physical Exam Vitals reviewed.  Constitutional:      General: She is not in acute distress. HENT:     Head: Normocephalic.  Cardiovascular:     Rate and Rhythm: Normal rate and regular rhythm.     Heart sounds: Normal heart sounds. No murmur heard.   Pulmonary:     Effort: Pulmonary effort is normal.     Breath sounds: Normal breath sounds.  Lymphadenopathy:     Cervical: No cervical adenopathy.  Neurological:     Mental Status: She is alert.  Psychiatric:        Behavior: Behavior normal.    Results for orders placed or performed in visit on 04/25/20   Lipid panel  Result Value Ref Range   Cholesterol, Total 125 100 - 199 mg/dL   Triglycerides 78 0 - 149 mg/dL   HDL 41 >39 mg/dL   VLDL Cholesterol Cal 16 5 - 40 mg/dL   LDL Chol Calc (NIH) 68 0 - 99 mg/dL   Chol/HDL Ratio 3.0 0.0 - 4.4 ratio  Hemoglobin A1c  Result Value Ref Range   Hgb A1c MFr Bld 6.5 (H) 4.8 - 5.6 %   Est. average glucose Bld gHb Est-mCnc 140 mg/dL  Basic metabolic panel  Result Value Ref Range   Glucose 100 (H) 65 - 99 mg/dL   BUN 19 6 - 24 mg/dL   Creatinine, Ser 0.88 0.57 - 1.00 mg/dL   eGFR 77 >59 mL/min/1.73   BUN/Creatinine Ratio 22 9 - 23   Sodium 138 134 - 144 mmol/L   Potassium 4.7 3.5 - 5.2 mmol/L   Chloride 96 96 - 106 mmol/L   CO2 24 20 - 29 mmol/L   Calcium 10.2 8.7 - 10.2 mg/dL  Microalbumin / creatinine urine ratio  Result Value Ref Range   Creatinine, Urine 80.3 Not Estab. mg/dL   Microalbumin, Urine 3.9 Not Estab. ug/mL   Microalb/Creat Ratio 5 0 - 29 mg/g creat          Assessment & Plan:  1. Type 2 diabetes mellitus with other specified complication, without long-term current use of insulin (HCC) Moderate diabetes A1c looks better than what it was continue the Victoza does not tolerate Metformin healthy diet recommended regular physical activity  2. Hyperlipidemia associated with  type 2 diabetes mellitus ([place]) Continue take medication keep cholesterol under control recent labs look good.  3. Chronic bilateral low back pain, unspecified whether sciatica present Chronic low back pain doing better currently  4. Depression, major, single episode, moderate (HCC) Significant depression.  Not suicidal.  Patient defers on counseling.  Bump up the dose of the Prozac to 2 tablets daily Recheck in 4 weeks If progressive troubles or worse notify us Strong consideration for counseling if getting worse Patient stressed about grandchildren who are becoming older but unfortunately disrespectful and do not help her out despite her giving large  amounts of love care concerned of money to them through the years  84. Dyspnea, unspecified type Occasionally finds her self out of breath for just a couple seconds at a time sometimes has to take a deep breath sometimes this occurs with activity sometimes at rest EKG is reassuring no acute changes are noted.  We will order a chest x-ray more than likely this is stress related but certainly if it is worse or progressive will need a more thorough work-up - EKG 12-Lead - DG Chest 2 View

## 2020-05-08 NOTE — Progress Notes (Addendum)
[facility]    Provider:  Dr Jaynee Eagles Requesting Provider: Jeanella Anton, NP Primary Care Provider: [facility]  CC:  Migraines  Virtual Visit via Telephone Note  I connected with [name] on 05/09/20 at  3:00 PM EDT by telephone and verified that I am speaking with the correct person using two identifiers.  Location: Patient: home  Provider: office   I discussed the limitations, risks, security and privacy concerns of performing an evaluation and management service by telephone and the availability of in person appointments. I also discussed with the patient that there may be a patient responsible charge related to this service. The patient expressed understanding and agreed to proceed.   Follow Up Instructions:    I discussed the assessment and treatment plan with the patient. The patient was provided an opportunity to ask questions and all were answered. The patient agreed with the plan and demonstrated an understanding of the instructions.   The patient was advised to call back or seek an in-person evaluation if the symptoms worsen or if the condition fails to improve as anticipated.  I provided 25 minutes of non-face-to-face time during this encounter.   Melvenia Beam, MD    05/09/2020: She is doing better but still having headaches. We discussed he untreated sleep apnea again, discussed options.  HPI:  [name] is a 55 y.o. female here as requested by Jeanella Anton for migraines.  She has a past medical history of sleep apnea, migraines, lumbar spondylosis, IBS, hypertension, GERD, diabetes, chronic pain syndrome and chronic low back pain, arthritis, anxiety, chronic cough.  I reviewed [employer] notes, patient reports aura, headache, photophobia, phonophobia, nausea and vomiting, no vision loss, headache located in the entire head, radiates to the entire head and neck, pulsating throbbing sharp dull  achy and knifelike.  She has had these for 40 years and they occur constantly and they last for 24 hours, severe and worsening.  Migraine triggers include fatigue and sleep deprivation.  Patient reports relief with Tylenol or NSAIDs, triptans, rest, sleep quiet cool room in darkened room.  She has associated vertigo, none associated paresthesias or autonomic symptoms.  She is on Topamax.  She is currently able to do activities of daily living without limitations.  By report there is poor compliance with treatment.  She is reported nothing works.  She was evaluated years ago that headache center.  I also reviewed lab work which included low vitamin D at 20, ANA negative, rheumatoid factor within normal limits.   She has had migraines since the age of 55, getting more persistent, she had one recently for 6 days, she has light and sound sensitivity, nausea, vomiting, pounding/pulsating/throbbing, aching, it can start unilaterally and spread to the whole head even in the back, she is having daily headaches and at least 15 moderately severe to severe migraine days. Laying down helps. It can be severe, for 6 days it was terrible, she couldn't eat. She wakes up with headaches, they are worse in the morning and are positional, she has blurry vision, daily migraines and headaches, worsening, unknown triggers, ongoing for over a year or more with worsening and exacerbations. Also associated dizziness,fatigue. No other focal neurologic deficits, associated symptoms, inciting events or modifiable factors.    Reviewed notes, labs and imaging from outside physicians, which showed:  Medications tried include Cymbalta, Topamax, diclofenac, Tylenol, NSAIDs, metoprolol, Robaxin, Toradol injections, losartan, injections of Depo-Medrol, Zofran injections and disintegrating tablet, prednisone tablets, promethazine  25 mg tablets, trazodone, tramadol, Topamax up to 200 mg a day  TSH normal  Review of Systems: Patient  complains of symptoms per HPI as well as the following symptoms: headache, sleep apnea. Pertinent negatives and positives per HPI. All others negative.   Social History   Socioeconomic History  . Marital status: Single    Spouse name: Not on file  . Number of children: 1  . Years of education: Not on file  . Highest education level: High school graduate  Occupational History  . Occupation: unemployed  Tobacco Use  . Smoking status: Never Smoker  . Smokeless tobacco: Never Used  Vaping Use  . Vaping Use: Never used  Substance and Sexual Activity  . Alcohol use: No    Alcohol/week: 0.0 standard drinks  . Drug use: No  . Sexual activity: Not Currently    Birth control/protection: Surgical  Other Topics Concern  . Not on file  Social History Narrative   Lives alone   Right handed   Caffeine: maybe 1 cup of tea if she goes out to dinner. "I don't really do caffeine".   Social Determinants of Health   Financial Resource Strain: Not on file  Food Insecurity: Not on file  Transportation Needs: Not on file  Physical Activity: Not on file  Stress: Not on file  Social Connections: Not on file  Intimate Partner Violence: Not on file    Family History  Problem Relation Age of Onset  . Stroke Mother   . Hypertension Mother   . Diabetes Mother   . Hypercholesterolemia Mother   . Depression Mother   . Anxiety disorder Mother   . Thyroid disease Father   . Diabetes Father   . Hypertension Sister   . Hypertension Brother   . Cancer Paternal Grandfather        prostate  . Colon cancer Maternal Grandfather   . Healthy Daughter   . Migraines Neg Hx     Past Medical History:  Diagnosis Date  . Allergy   . Anxiety   . Arthritis   . Chronic cough   . Chronic low back pain   . Chronic pain syndrome   . Diabetes mellitus without complication ([place])   . Folliculitis 0/86/5784  . GERD (gastroesophageal reflux disease)   . Hidradenitis suppurativa   . Hypertension   . IBS  (irritable bowel syndrome)   . Lumbar spondylolysis   . Migraine headache   . Pulmonary nodule   . Reactive airway disease 01/2018   per PFT's  . Sleep apnea     Patient Active Problem List   Diagnosis Date Noted  . Hyperlipidemia associated with type 2 diabetes mellitus ([place]) 05/07/2020  . Epistaxis 11/14/2019  . Osteoarthritis involving multiple joints on both sides of body 01/28/2019  . Chronic pain of multiple sites 01/28/2019  . Hyperlipidemia LDL goal <70 11/25/2018  . Herpes labialis 07/20/2017  . Depression, major, single episode, moderate ([place]) 07/21/2016  . Chronic bilateral low back pain 12/02/2015  . Dysphagia 09/11/2015  . Gastroesophageal reflux disease without esophagitis 07/24/2015  . Colon cancer screening   . Encounter for screening colonoscopy 03/14/2015  . Insomnia 11/26/2014  . Varicose veins 11/26/2014  . Osteoarthritis of both knees 08/28/2014  . Sleep apnea 05/25/2014  . Depression 02/04/2014  . Skin abscess 12/01/2012  . Hidradenitis suppurativa 08/03/2012  . Folliculitis 69/62/9528  . Migraine 06/04/2008  . Elevated blood pressure reading in office with diagnosis of hypertension 06/04/2008  .  CONSTIPATION, CHRONIC 06/04/2008  . Chronic arthralgias of knees and hips 06/04/2008  . ABDOMINAL PAIN 06/04/2008    Past Surgical History:  Procedure Laterality Date  . ABDOMINAL HYSTERECTOMY    . COLONOSCOPY WITH PROPOFOL N/A 04/08/2015   Dr. Gala Romney: normal, repeat in 10 years   . ENDOMETRIAL ABLATION    . FOREIGN BODY REMOVAL N/A 05/26/2013   Procedure: FOREIGN BODY REMOVAL ADULT ABDOMINAL WALL;  Surgeon: Jamesetta So, MD;  Location: [facility];  Service: General;  Laterality: N/A;  . KNEE SURGERY Right 2012  . PILONIDAL CYST EXCISION  June 2011  . TUBAL LIGATION      Current Outpatient Medications  Medication Sig Dispense Refill  . albuterol (VENTOLIN HFA) 108 (90 Base) MCG/ACT inhaler Inhale 2 puffs into the lungs every 4 (four) hours as needed. 1  each 5  . amLODipine (NORVASC) 5 MG tablet Take 1 tablet (5 mg total) by mouth daily. 90 tablet 1  . Ascorbic Acid (VITAMIN C PO) Take by mouth.    . blood glucose meter kit and supplies Dispense based on patient and insurance preference. Use to test sugar once a day. For dx E11.9 1 each 11  . famotidine (PEPCID) 20 MG tablet Take 1 tablet (20 mg total) by mouth 2 (two) times daily. 60 tablet 5  . FLUoxetine (PROZAC) 20 MG tablet 2 qd 60 tablet 5  . fluticasone (FLONASE) 50 MCG/ACT nasal spray Place 2 sprays into both nostrils daily as needed for allergies or rhinitis. 16 g 5  . fluticasone (FLOVENT HFA) 110 MCG/ACT inhaler Inhale 2 puffs into the lungs 2 (two) times daily. 1 Inhaler 5  . Fremanezumab-vfrm (AJOVY) 225 MG/1.5ML SOAJ Inject 225 mg into the skin every 30 (thirty) days. 3 pen 4  . Fremanezumab-vfrm (AJOVY) 225 MG/1.5ML SOAJ Inject 225 mg into the skin every 30 (thirty) days. 4.5 mL 4  . gabapentin (NEURONTIN) 100 MG capsule TAKE 1 CAPSULE BY MOUTH THREE TIMES DAILY 270 capsule 0  . Insulin Pen Needle (RELION PEN NEEDLES) 31G X 6 MM MISC AS DIRECTED FOR  VICTOZA  PEN 100 each 0  . losartan (COZAAR) 25 MG tablet Take 1 tablet (25 mg total) by mouth daily. 90 tablet 0  . meloxicam (MOBIC) 15 MG tablet Take 1 tablet (15 mg total) by mouth daily. 90 tablet 0  . metoprolol tartrate (LOPRESSOR) 50 MG tablet Take 1 tablet by mouth twice daily 180 tablet 0  . Multiple Vitamins-Minerals (MULTIPLE VITAMINS/WOMENS PO) Take by mouth.    . ondansetron (ZOFRAN ODT) 8 MG disintegrating tablet Take 1 tablet (8 mg total) by mouth every 8 (eight) hours as needed for nausea or vomiting. 18 tablet 4  . rosuvastatin (CRESTOR) 5 MG tablet TAKE 1 TABLET BY MOUTH ONCE DAILY FOR CHOLESTEROL 90 tablet 1  . triamterene-hydrochlorothiazide (MAXZIDE-25) 37.5-25 MG tablet Take 1 tablet by mouth once daily 90 tablet 0  . VICTOZA 18 MG/3ML SOPN INJECT 0.3MLS (1.8MG TOTAL) INTO THE SKIN DAILY 27 mL 5  . VITAMIN D PO  Take by mouth.     No current facility-administered medications for this visit.    Allergies as of 05/09/2020 - Review Complete 05/07/2020  Allergen Reaction Noted  . Bactrim [sulfamethoxazole-trimethoprim] Rash 03/16/2013  . Lisinopril  08/28/2014  . Other  08/03/2012  . Metformin and related  12/21/2017    Vitals: There were no vitals taken for this visit. Last Weight:  Wt Readings from Last 1 Encounters:  05/07/20 221 lb (100.2 kg)  Last Height:   Ht Readings from Last 1 Encounters:  05/07/20 5' 5"  (1.651 m)     Physical exam: Exam: Gen: NAD, conversant, well nourised, obese, well groomed                     CV: RRR, no MRG. No Carotid Bruits. No peripheral edema, warm, nontender Eyes: Conjunctivae clear without exudates or hemorrhage  Neuro: Detailed Neurologic Exam  Speech:    Speech is normal; fluent and spontaneous with normal comprehension.  Cognition:    The patient is oriented to person, place, and time;     recent and remote memory intact;     language fluent;     normal attention, concentration,     fund of knowledge Cranial Nerves:    The pupils are equal, round, and reactive to light. The fundi are normal and spontaneous venous pulsations are present. Visual fields are full to finger confrontation. Extraocular movements are intact. Trigeminal sensation is intact and the muscles of mastication are normal. The face is symmetric. The palate elevates in the midline. Hearing intact. Voice is normal. Shoulder shrug is normal. The tongue has normal motion without fasciculations.   Coordination:    No dysmetria   Gait:  normal native gait  Motor Observation:    No asymmetry, no atrophy, and no involuntary movements noted. Tone:    Normal muscle tone.    Posture:    Posture is normal. normal erect    Strength:    Strength is V/V in the upper and lower limbs.      Sensation: intact to LT     Reflex Exam:  DTR's:    Deep tendon reflexes in the  upper and lower extremities are symmetrical bilaterally.   Toes:    The toes are downgoing bilaterally.   Clonus:    Clonus is absent.    Assessment/Plan:  55 y.o. female here as requested by Jeanella Anton for migraines.  She has a past medical history of sleep apnea, migraines, lumbar spondylosis, IBS, hypertension, GERD, diabetes, chronic pain syndrome and chronic low back pain, arthritis, anxiety, chronic cough.   - She has failed multiple medications and compliance is an issue. She has constipation so will not give Aimovig, Ajovy has helped but she is still having migraines. Start Botox initiation.  - Also discussed her untreated sleep apnea and sequelae including headaches, stroke and others. She had "pretty bad" sleep apnea. Wake up with headaches. She is very tired during. Sleep eval  - Start botox approval  - Continue Ajovy  - MRI of the brain normal for age (personally reviewed images)   No orders of the defined types were placed in this encounter.  Orders Placed This Encounter  Procedures  . Ambulatory referral to Sleep Studies     - MRI brain due to concerning symptoms of morning headaches, positional headaches,vision changes  to look for space occupying mass, chiari or intracranial hypertension (pseudotumor).  Discussed: To prevent or relieve headaches, try the following: Cool Compress. Lie down and place a cool compress on your head.  Avoid headache triggers. If certain foods or odors seem to have triggered your migraines in the past, avoid them. A headache diary might help you identify triggers.  Include physical activity in your daily routine. Try a daily walk or other moderate aerobic exercise.  Manage stress. Find healthy ways to cope with the stressors, such as delegating tasks on your to-do list.  Practice relaxation techniques. Try  deep breathing, yoga, massage and visualization.  Eat regularly. Eating regularly scheduled meals and maintaining a healthy diet  might help prevent headaches. Also, drink plenty of fluids.  Follow a regular sleep schedule. Sleep deprivation might contribute to headaches Consider biofeedback. With this mind-body technique, you learn to control certain bodily functions -- such as muscle tension, heart rate and blood pressure -- to prevent headaches or reduce headache pain.    Proceed to emergency room if you experience new or worsening symptoms or symptoms do not resolve, if you have new neurologic symptoms or if headache is severe, or for any concerning symptom.   Provided education and documentation from American headache Society toolbox including articles on: chronic migraine medication overuse headache, chronic migraines, prevention of migraines, behavioral and other nonpharmacologic treatments for headache.    Orders Placed This Encounter  Procedures  . Ambulatory referral to Sleep Studies   No orders of the defined types were placed in this encounter.   Cc: Jeanella Anton, NP, [facility]  Sarina Ill, MD  [facility] [address] [place], [place] [address]  Phone [phone] Fax [phone]

## 2020-05-09 ENCOUNTER — Telehealth: Payer: Self-pay | Admitting: Neurology

## 2020-05-09 ENCOUNTER — Telehealth (INDEPENDENT_AMBULATORY_CARE_PROVIDER_SITE_OTHER): Payer: Medicare HMO | Admitting: Neurology

## 2020-05-09 DIAGNOSIS — G43709 Chronic migraine without aura, not intractable, without status migrainosus: Secondary | ICD-10-CM | POA: Diagnosis not present

## 2020-05-09 DIAGNOSIS — G4733 Obstructive sleep apnea (adult) (pediatric): Secondary | ICD-10-CM

## 2020-05-09 NOTE — Telephone Encounter (Signed)
Charge sheet for botox completed, signed by Dr Lucia Gaskins, and given to botox coordinator.

## 2020-05-09 NOTE — Telephone Encounter (Signed)
Patient is interested in botox depending on the copay. She has secondary medicaid, will it pick up the balance after medicare? Please start approval process and let her know thanks

## 2020-05-09 NOTE — Telephone Encounter (Signed)
Offered pt MyChart visit due to the bad weather (ok per Dr. Lucia Gaskins). Pt is going to discuss with her daughter and call us back. Please change visit to MyChart if that is what pt decides.

## 2020-05-09 NOTE — Telephone Encounter (Signed)
Yes thank you, I went back and added G43.709 to my note as well!

## 2020-05-09 NOTE — Telephone Encounter (Signed)
Pt has called back and has agreed to a telephone visit, she is asking to be called at [phone]

## 2020-05-13 NOTE — Telephone Encounter (Signed)
I called patient and LVM to discuss. Requested she return my call.

## 2020-05-20 NOTE — Telephone Encounter (Signed)
I was able to speak with patient this morning. Advised that [employer] (her current insurance) typically pays pretty well for Botox in my experience. I explained that only thing she would have to pay up front is the co-pay for specialist visit if she has one. Advised that our office does not accept Medicaid-family planning. Scheduled her first appointment for 4/19.

## 2020-05-20 NOTE — Telephone Encounter (Signed)
Started [employer] Botox PA form. Will fax to [facility] with office notes.

## 2020-05-20 NOTE — Telephone Encounter (Signed)
Received approval from [facility]. PA #[id number] (05/20/20- 05/20/21).

## 2020-05-24 ENCOUNTER — Emergency Department (HOSPITAL_COMMUNITY)
Admission: EM | Admit: 2020-05-24 | Discharge: 2020-05-24 | Disposition: A | Discharge status: Home / Self Care | Payer: Medicare HMO | Attending: Emergency Medicine | Admitting: Emergency Medicine

## 2020-05-24 ENCOUNTER — Other Ambulatory Visit: Payer: Self-pay

## 2020-05-24 ENCOUNTER — Emergency Department (HOSPITAL_COMMUNITY): Payer: Medicare HMO

## 2020-05-24 ENCOUNTER — Encounter (HOSPITAL_COMMUNITY): Payer: Self-pay | Admitting: Emergency Medicine

## 2020-05-24 DIAGNOSIS — Z794 Long term (current) use of insulin: Secondary | ICD-10-CM | POA: Diagnosis not present

## 2020-05-24 DIAGNOSIS — M545 Low back pain, unspecified: Secondary | ICD-10-CM | POA: Diagnosis not present

## 2020-05-24 DIAGNOSIS — E1169 Type 2 diabetes mellitus with other specified complication: Secondary | ICD-10-CM | POA: Diagnosis not present

## 2020-05-24 DIAGNOSIS — I10 Essential (primary) hypertension: Secondary | ICD-10-CM | POA: Diagnosis not present

## 2020-05-24 DIAGNOSIS — Y92410 Unspecified street and highway as the place of occurrence of the external cause: Secondary | ICD-10-CM | POA: Diagnosis not present

## 2020-05-24 DIAGNOSIS — Z79899 Other long term (current) drug therapy: Secondary | ICD-10-CM | POA: Diagnosis not present

## 2020-05-24 DIAGNOSIS — M542 Cervicalgia: Secondary | ICD-10-CM | POA: Insufficient documentation

## 2020-05-24 DIAGNOSIS — M5459 Other low back pain: Secondary | ICD-10-CM | POA: Diagnosis not present

## 2020-05-24 MED ORDER — LIDOCAINE 5 % EX PTCH: 1.0000 | MEDICATED_PATCH | CUTANEOUS | 0 refills | Status: DC

## 2020-05-24 MED ORDER — METHOCARBAMOL 500 MG PO TABS: 500.0000 mg | ORAL_TABLET | Freq: Three times a day (TID) | ORAL | 0 refills | Status: DC | PRN

## 2020-05-24 MED ORDER — METHOCARBAMOL 500 MG PO TABS
500.0000 mg | ORAL_TABLET | Freq: Once | ORAL | Status: AC
  Administered 2020-05-24: 500 mg via ORAL
  Filled 2020-05-24: qty 1

## 2020-05-24 MED ORDER — ACETAMINOPHEN 500 MG PO TABS
1000.0000 mg | ORAL_TABLET | Freq: Once | ORAL | Status: AC
  Administered 2020-05-24: 1000 mg via ORAL
  Filled 2020-05-24: qty 2

## 2020-05-24 MED ORDER — LIDOCAINE 5 % EX PTCH
1.0000 | MEDICATED_PATCH | CUTANEOUS | Status: DC
  Administered 2020-05-24: 1 via TRANSDERMAL
  Filled 2020-05-24: qty 1
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
## 2020-05-28 ENCOUNTER — Encounter: Payer: Self-pay | Admitting: Neurology

## 2020-05-28 ENCOUNTER — Ambulatory Visit: Payer: Medicare HMO | Admitting: Neurology

## 2020-05-28 DIAGNOSIS — G43709 Chronic migraine without aura, not intractable, without status migrainosus: Secondary | ICD-10-CM | POA: Diagnosis not present

## 2020-05-28 MED ORDER — ALPRAZOLAM 0.25 MG PO TABS: ORAL_TABLET | ORAL | 0 refills | Status: DC

## 2020-05-28 NOTE — Progress Notes (Signed)
Patient signed botox consent   Botox- 200 units x 1 vial Lot: [id number] Expiration: 12/2022 NDC: 8638-1771-16  Bacteriostatic 0.9% Sodium Chloride- 53mL total Lot: [id number] Expiration: 03/12/2021 NDC: 3338-3291-91  Dx: Y60.600 B/B

## 2020-05-28 NOTE — Progress Notes (Addendum)
Consent Form Botulism Toxin Injection For Chronic Migraine  First botox. +5u each masseter in addition. Extra spread it over the left temporal area where her migraines start. Did it high in the forehead to avoid droop, ask her how it went we can always do them higher in the hairline if needed. Can ask about orb occuli next time.  She was extremely nervous, sweaty, tearing up, excessive fear of needles, I did give her some Xanax for next time, advised her not to drive, try taking even half a pill, she may even want to try taking it at home 1 evening just to make sure it does not cause her extreme sedation before taking it prior to coming to the office for  Meds ordered this encounter  Medications  . ALPRAZolam (XANAX) 0.25 MG tablet    Sig: Take 1-2 tabs (0.25mg -0.50mg ) 30-60 minutes before procedure. May repeat if needed.Do not drive.    Dispense:  10 tablet    Refill:  0     Reviewed orally with patient, additionally signature is on file:  Botulism toxin has been approved by the Federal drug administration for treatment of chronic migraine. Botulism toxin does not cure chronic migraine and it may not be effective in some patients.  The administration of botulism toxin is accomplished by injecting a small amount of toxin into the muscles of the neck and head. Dosage must be titrated for each individual. Any benefits resulting from botulism toxin tend to wear off after 3 months with a repeat injection required if benefit is to be maintained. Injections are usually done every 3-4 months with maximum effect peak achieved by about 2 or 3 weeks. Botulism toxin is expensive and you should be sure of what costs you will incur resulting from the injection.  The side effects of botulism toxin use for chronic migraine may include:   -Transient, and usually mild, facial weakness with facial injections  -Transient, and usually mild, head or neck weakness with head/neck injections  -Reduction or loss of  forehead facial animation due to forehead muscle weakness  -Eyelid drooping  -Dry eye  -Pain at the site of injection or bruising at the site of injection  -Double vision  -Potential unknown long term risks  Contraindications: You should not have Botox if you are pregnant, nursing, allergic to albumin, have an infection, skin condition, or muscle weakness at the site of the injection, or have myasthenia gravis, Lambert-Eaton syndrome, or ALS.  It is also possible that as with any injection, there may be an allergic reaction or no effect from the medication. Reduced effectiveness after repeated injections is sometimes seen and rarely infection at the injection site may occur. All care will be taken to prevent these side effects. If therapy is given over a long time, atrophy and wasting in the muscle injected may occur. Occasionally the patient's become refractory to treatment because they develop antibodies to the toxin. In this event, therapy needs to be modified.  I have read the above information and consent to the administration of botulism toxin.    BOTOX PROCEDURE NOTE FOR MIGRAINE HEADACHE    Contraindications and precautions discussed with patient(above). Aseptic procedure was observed and patient tolerated procedure. Procedure performed by Dr. Artemio Aly  The condition has existed for more than 6 months, and pt does not have a diagnosis of ALS, Myasthenia Gravis or Lambert-Eaton Syndrome.  Risks and benefits of injections discussed and pt agrees to proceed with the procedure.  Written consent obtained  These injections are medically necessary. Pt  receives good benefits from these injections. These injections do not cause sedations or hallucinations which the oral therapies may cause.  Description of procedure:  The patient was placed in a sitting position. The standard protocol was used for Botox as follows, with 5 units of Botox injected at each site:   -Procerus muscle,  midline injection  -Corrugator muscle, bilateral injection  -Frontalis muscle, bilateral injection, with 2 sites each side, medial injection was performed in the upper one third of the frontalis muscle, in the region vertical from the medial inferior edge of the superior orbital rim. The lateral injection was again in the upper one third of the forehead vertically above the lateral limbus of the cornea, 1.5 cm lateral to the medial injection site.  -Temporalis muscle injection, 4 sites, bilaterally. The first injection was 3 cm above the tragus of the ear, second injection site was 1.5 cm to 3 cm up from the first injection site in line with the tragus of the ear. The third injection site was 1.5-3 cm forward between the first 2 injection sites. The fourth injection site was 1.5 cm posterior to the second injection site.   -Occipitalis muscle injection, 3 sites, bilaterally. The first injection was done one half way between the occipital protuberance and the tip of the mastoid process behind the ear. The second injection site was done lateral and superior to the first, 1 fingerbreadth from the first injection. The third injection site was 1 fingerbreadth superiorly and medially from the first injection site.  -Cervical paraspinal muscle injection, 2 sites, bilateral knee first injection site was 1 cm from the midline of the cervical spine, 3 cm inferior to the lower border of the occipital protuberance. The second injection site was 1.5 cm superiorly and laterally to the first injection site.  -Trapezius muscle injection was performed at 3 sites, bilaterally. The first injection site was in the upper trapezius muscle halfway between the inflection point of the neck, and the acromion. The second injection site was one half way between the acromion and the first injection site. The third injection was done between the first injection site and the inflection point of the neck.   Will return for repeat  injection in 3 months.   200 units of Botox was used, any Botox not injected was wasted. The patient tolerated the procedure well, there were no complications of the above procedure.

## 2020-06-03 ENCOUNTER — Encounter: Payer: Self-pay | Admitting: Family Medicine

## 2020-06-03 ENCOUNTER — Ambulatory Visit (INDEPENDENT_AMBULATORY_CARE_PROVIDER_SITE_OTHER): Payer: Medicare HMO | Admitting: Family Medicine

## 2020-06-03 ENCOUNTER — Other Ambulatory Visit: Payer: Self-pay

## 2020-06-03 DIAGNOSIS — M62838 Other muscle spasm: Secondary | ICD-10-CM

## 2020-06-03 DIAGNOSIS — M545 Low back pain, unspecified: Secondary | ICD-10-CM

## 2020-06-03 MED ORDER — METHOCARBAMOL 500 MG PO TABS: 500.0000 mg | ORAL_TABLET | Freq: Three times a day (TID) | ORAL | 0 refills | Status: DC | PRN
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
## 2020-06-03 NOTE — Patient Instructions (Addendum)
[facility] has walk-in clinic and there is an after hours ortho clinic with [facility] in [place] if your symptoms worsen while you await the ortho referral.      Methocarbamol tablets What is this medicine? METHOCARBAMOL (meth oh KAR ba mole) helps to relieve pain and stiffness in muscles caused by strains, sprains, or other injury to your muscles. This medicine may be used for other purposes; ask your health care provider or pharmacist if you have questions. COMMON BRAND NAME(S): Robaxin What should I tell my health care provider before I take this medicine? They need to know if you have any of these conditions:  kidney disease  seizures  an unusual or allergic reaction to methocarbamol, other medicines, foods, dyes, or preservatives  pregnant or trying to get pregnant  breast-feeding How should I use this medicine? Take this medicine by mouth with a full glass of water. Follow the directions on the prescription label. Take your medicine at regular intervals. Do not take your medicine more often than directed. Talk to your pediatrician regarding the use of this medicine in children. Special care may be needed. Overdosage: If you think you have taken too much of this medicine contact a poison control center or emergency room at once. NOTE: This medicine is only for you. Do not share this medicine with others. What if I miss a dose? If you miss a dose, take it as soon as you can. If it is almost time for your next dose, take only the next dose. Do not take double or extra doses. What may interact with this medicine? Do not take this medication with any of the following medicines:  narcotic medicines for cough This medicine may also interact with the following medications:  alcohol  antihistamines for allergy, cough and cold  certain medicines for anxiety or sleep  certain medicines for depression like amitriptyline, fluoxetine, sertraline  certain medicines for  seizures like phenobarbital, primidone  cholinesterase inhibitors like neostigmine, ambenonium, and pyridostigmine bromide  general anesthetics like halothane, isoflurane, methoxyflurane, propofol  local anesthetics like lidocaine, pramoxine, tetracaine  medicines that relax muscles for surgery  narcotic medicines for pain  phenothiazines like chlorpromazine, mesoridazine, prochlorperazine, thioridazine This list may not describe all possible interactions. Give your health care provider a list of all the medicines, herbs, non-prescription drugs, or dietary supplements you use. Also tell them if you smoke, drink alcohol, or use illegal drugs. Some items may interact with your medicine. What should I watch for while using this medicine? Tell your doctor or health care professional if your symptoms do not start to get better or if they get worse. You may get drowsy or dizzy. Do not drive, use machinery, or do anything that needs mental alertness until you know how this medicine affects you. Do not stand or sit up quickly, especially if you are an older patient. This reduces the risk of dizzy or fainting spells. Alcohol may interfere with the effect of this medicine. Avoid alcoholic drinks. If you are taking another medicine that also causes drowsiness, you may have more side effects. Give your health care provider a list of all medicines you use. Your doctor will tell you how much medicine to take. Do not take more medicine than directed. Call emergency for help if you have problems breathing or unusual sleepiness. What side effects may I notice from receiving this medicine? Side effects that you should report to your doctor or health care professional as soon as possible:  allergic  reactions like skin rash, itching or hives, swelling of the face, lips, or tongue  breathing problems  confusion  seizures  unusually weak or tired Side effects that usually do not require medical attention  (report to your doctor or health care professional if they continue or are bothersome):  dizziness  headache  metallic taste  tiredness  upset stomach This list may not describe all possible side effects. Call your doctor for medical advice about side effects. You may report side effects to FDA at 1-800-FDA-1088. Where should I keep my medicine? Keep out of the reach of children. Store at room temperature between 20 and 25 degrees C (68 and 77 degrees F). Keep container tightly closed. Throw away any unused medicine after the expiration date. NOTE: This sheet is a summary. It may not cover all possible information. If you have questions about this medicine, talk to your doctor, pharmacist, or health care provider.  2021 Elsevier/Gold Standard (2014-11-06 13:11:54)

## 2020-06-04 ENCOUNTER — Ambulatory Visit (INDEPENDENT_AMBULATORY_CARE_PROVIDER_SITE_OTHER): Payer: Medicare HMO | Admitting: Family Medicine

## 2020-06-04 DIAGNOSIS — M545 Low back pain, unspecified: Secondary | ICD-10-CM | POA: Diagnosis not present

## 2020-06-04 DIAGNOSIS — M542 Cervicalgia: Secondary | ICD-10-CM

## 2020-06-04 MED ORDER — BACLOFEN 10 MG PO TABS: 5.0000 mg | ORAL_TABLET | Freq: Every evening | ORAL | 3 refills | Status: DC | PRN
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
## 2020-06-07 ENCOUNTER — Ambulatory Visit (INDEPENDENT_AMBULATORY_CARE_PROVIDER_SITE_OTHER): Payer: Medicare HMO | Admitting: Family Medicine

## 2020-06-07 ENCOUNTER — Other Ambulatory Visit: Payer: Self-pay

## 2020-06-07 ENCOUNTER — Encounter: Payer: Self-pay | Admitting: Family Medicine

## 2020-06-07 VITALS — BP 122/78 | Temp 98.0°F | Wt 220.0 lb

## 2020-06-07 DIAGNOSIS — E1169 Type 2 diabetes mellitus with other specified complication: Secondary | ICD-10-CM | POA: Diagnosis not present

## 2020-06-07 DIAGNOSIS — R69 Illness, unspecified: Secondary | ICD-10-CM | POA: Diagnosis not present

## 2020-06-07 DIAGNOSIS — F321 Major depressive disorder, single episode, moderate: Secondary | ICD-10-CM

## 2020-06-07 MED ORDER — MUPIROCIN 2 % EX OINT: TOPICAL_OINTMENT | CUTANEOUS | 2 refills | Status: DC
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
## 2020-06-11 ENCOUNTER — Other Ambulatory Visit: Payer: Self-pay

## 2020-06-11 ENCOUNTER — Ambulatory Visit (INDEPENDENT_AMBULATORY_CARE_PROVIDER_SITE_OTHER): Payer: Medicare HMO | Admitting: Physical Therapy

## 2020-06-11 DIAGNOSIS — M6281 Muscle weakness (generalized): Secondary | ICD-10-CM | POA: Diagnosis not present

## 2020-06-11 DIAGNOSIS — M542 Cervicalgia: Secondary | ICD-10-CM | POA: Diagnosis not present

## 2020-06-11 DIAGNOSIS — M545 Low back pain, unspecified: Secondary | ICD-10-CM | POA: Diagnosis not present

## 2020-06-11 NOTE — Patient Instructions (Signed)
Access Code: [email or web address] URL: [email or web address] Date: 06/11/2020 Prepared by: Narda Amber  Exercises Seated Assisted Cervical Rotation with Towel - 3 x daily - 7 x weekly - 5 reps - 10 seconds hold Seated Upper Trap Stretch - 3 x daily - 7 x weekly - 5 reps - 10 seconds hold Seated Cervical Retraction - 3 x daily - 7 x weekly - 10 reps - 5 seconds hold Hooklying Single Knee to Chest Stretch - 3 x daily - 7 x weekly - 3 reps - 20 seconds hold Supine Bridge - 3 x daily - 7 x weekly - 10 reps - 5 seconds hold Supine Lower Trunk Rotation - 3 x daily - 7 x weekly - 5 reps - 10 seconds hold

## 2020-06-11 NOTE — Addendum Note (Signed)
Addended by: Sharmon Leyden on: 06/11/2020 04:46 PM   Modules accepted: Orders
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
## 2020-06-19 ENCOUNTER — Encounter: Payer: Self-pay | Admitting: Rehabilitative and Restorative Service Providers"

## 2020-06-19 ENCOUNTER — Other Ambulatory Visit: Payer: Self-pay

## 2020-06-19 ENCOUNTER — Ambulatory Visit (INDEPENDENT_AMBULATORY_CARE_PROVIDER_SITE_OTHER): Payer: Medicare HMO | Admitting: Rehabilitative and Restorative Service Providers"

## 2020-06-19 DIAGNOSIS — M545 Low back pain, unspecified: Secondary | ICD-10-CM | POA: Diagnosis not present

## 2020-06-19 DIAGNOSIS — M542 Cervicalgia: Secondary | ICD-10-CM

## 2020-06-19 DIAGNOSIS — M6281 Muscle weakness (generalized): Secondary | ICD-10-CM
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
## 2020-06-25 ENCOUNTER — Encounter: Payer: Medicare HMO | Admitting: Rehabilitative and Restorative Service Providers"

## 2020-06-26 ENCOUNTER — Encounter: Payer: Self-pay | Admitting: Neurology

## 2020-06-26 ENCOUNTER — Ambulatory Visit: Payer: Medicare HMO | Admitting: Neurology

## 2020-06-26 VITALS — BP 128/83 | HR 69 | Ht 65.0 in | Wt 214.0 lb

## 2020-06-26 DIAGNOSIS — G4719 Other hypersomnia: Secondary | ICD-10-CM | POA: Diagnosis not present

## 2020-06-26 DIAGNOSIS — G8929 Other chronic pain: Secondary | ICD-10-CM

## 2020-06-26 DIAGNOSIS — F5105 Insomnia due to other mental disorder: Secondary | ICD-10-CM

## 2020-06-26 DIAGNOSIS — F339 Major depressive disorder, recurrent, unspecified: Secondary | ICD-10-CM

## 2020-06-26 DIAGNOSIS — R69 Illness, unspecified: Secondary | ICD-10-CM | POA: Diagnosis not present

## 2020-06-26 DIAGNOSIS — F99 Mental disorder, not otherwise specified: Secondary | ICD-10-CM

## 2020-06-26 DIAGNOSIS — R519 Headache, unspecified: Secondary | ICD-10-CM

## 2020-06-26 DIAGNOSIS — G44019 Episodic cluster headache, not intractable: Secondary | ICD-10-CM | POA: Diagnosis not present

## 2020-06-26 DIAGNOSIS — Z72821 Inadequate sleep hygiene: Secondary | ICD-10-CM | POA: Diagnosis not present

## 2020-06-26 DIAGNOSIS — R52 Pain, unspecified: Secondary | ICD-10-CM

## 2020-06-26 NOTE — Patient Instructions (Addendum)
Insomnia   sleep hygiene instruction - set a rise time, set a bedtime, keep only 7 hours between these two, hot bath or shower before bedtime, no caffeine in PM. Avoid all naps. No TV in bedroom, no blue light or screens in bedroom.  I prefer an attended sleep study to screen of OSA and to look for PLM related arousals.    Insomnia is a sleep disorder that makes it difficult to fall asleep or stay asleep. Insomnia can cause fatigue, low energy, difficulty concentrating, mood swings, and poor performance at work or school. There are three different ways to classify insomnia:  Difficulty falling asleep.  Difficulty staying asleep.  Waking up too early in the morning. Any type of insomnia can be long-term (chronic) or short-term (acute). Both are common. Short-term insomnia usually lasts for three months or less. Chronic insomnia occurs at least three times a week for longer than three months. What are the causes? Insomnia may be caused by another condition, situation, or substance, such as:  Anxiety.  Certain medicines.  Gastroesophageal reflux disease (GERD) or other gastrointestinal conditions.  Asthma or other breathing conditions.  Restless legs syndrome, sleep apnea, or other sleep disorders.  Chronic pain.  Menopause.  Stroke.  Abuse of alcohol, tobacco, or illegal drugs.  Mental health conditions, such as depression.  Caffeine.  Neurological disorders, such as Alzheimer's disease.  An overactive thyroid (hyperthyroidism). Sometimes, the cause of insomnia may not be known. What increases the risk? Risk factors for insomnia include:  Gender. Women are affected more often than men.  Age. Insomnia is more common as you get older.  Stress.  Lack of exercise.  Irregular work schedule or working night shifts.  Traveling between different time zones.  Certain medical and mental health conditions. What are the signs or symptoms? If you have insomnia, the main  symptom is having trouble falling asleep or having trouble staying asleep. This may lead to other symptoms, such as:  Feeling fatigued or having low energy.  Feeling nervous about going to sleep.  Not feeling rested in the morning.  Having trouble concentrating.  Feeling irritable, anxious, or depressed. How is this diagnosed? This condition may be diagnosed based on:  Your symptoms and medical history. Your health care provider may ask about: ? Your sleep habits. ? Any medical conditions you have. ? Your mental health.  A physical exam. How is this treated? Treatment for insomnia depends on the cause. Treatment may focus on treating an underlying condition that is causing insomnia. Treatment may also include:  Medicines to help you sleep.  Counseling or therapy.  Lifestyle adjustments to help you sleep better. Follow these instructions at home: Eating and drinking  Limit or avoid alcohol, caffeinated beverages, and cigarettes, especially close to bedtime. These can disrupt your sleep.  Do not eat a large meal or eat spicy foods right before bedtime. This can lead to digestive discomfort that can make it hard for you to sleep.   Sleep habits  Keep a sleep diary to help you and your health care provider figure out what could be causing your insomnia. Write down: ? When you sleep. ? When you wake up during the night. ? How well you sleep. ? How rested you feel the next day. ? Any side effects of medicines you are taking. ? What you eat and drink.  Make your bedroom a dark, comfortable place where it is easy to fall asleep. ? Put up shades or blackout curtains to  block light from outside. ? Use a white noise machine to block noise. ? Keep the temperature cool.  Limit screen use before bedtime. This includes: ? Watching TV. ? Using your smartphone, tablet, or computer.  Stick to a routine that includes going to bed and waking up at the same times every day and night.  This can help you fall asleep faster. Consider making a quiet activity, such as reading, part of your nighttime routine.  Try to avoid taking naps during the day so that you sleep better at night.  Get out of bed if you are still awake after 15 minutes of trying to sleep. Keep the lights down, but try reading or doing a quiet activity. When you feel sleepy, go back to bed.   General instructions  Take over-the-counter and prescription medicines only as told by your health care provider.  Exercise regularly, as told by your health care provider. Avoid exercise starting several hours before bedtime.  Use relaxation techniques to manage stress. Ask your health care provider to suggest some techniques that may work well for you. These may include: ? Breathing exercises. ? Routines to release muscle tension. ? Visualizing peaceful scenes.  Make sure that you drive carefully. Avoid driving if you feel very sleepy.  Keep all follow-up visits as told by your health care provider. This is important. Contact a health care provider if:  You are tired throughout the day.  You have trouble in your daily routine due to sleepiness.  You continue to have sleep problems, or your sleep problems get worse. Get help right away if:  You have serious thoughts about hurting yourself or someone else. If you ever feel like you may hurt yourself or others, or have thoughts about taking your own life, get help right away. You can go to your nearest emergency department or call:  Your local emergency services (911 in the [place]  A suicide crisis helpline, such as the National Suicide Prevention Lifeline at 681 356 2645. This is open 24 hours a day. Summary  Insomnia is a sleep disorder that makes it difficult to fall asleep or stay asleep.  Insomnia can be long-term (chronic) or short-term (acute).  Treatment for insomnia depends on the cause. Treatment may focus on treating an underlying condition  that is causing insomnia.  Keep a sleep diary to help you and your health care provider figure out what could be causing your insomnia. This information is not intended to replace advice given to you by your health care provider. Make sure you discuss any questions you have with your health care provider. Document Revised: 12/07/2019 Document Reviewed: 12/07/2019 [employer] Patient Education  2021 [employer] Insomnia Insomnia is a sleep disorder that makes it difficult to fall asleep or stay asleep. Insomnia can cause fatigue, low energy, difficulty concentrating, mood swings, and poor performance at work or school. There are three different ways to classify insomnia:  Difficulty falling asleep.  Difficulty staying asleep.  Waking up too early in the morning. Any type of insomnia can be long-term (chronic) or short-term (acute). Both are common. Short-term insomnia usually lasts for three months or less. Chronic insomnia occurs at least three times a week for longer than three months. What are the causes? Insomnia may be caused by another condition, situation, or substance, such as:  Anxiety.  Certain medicines.  Gastroesophageal reflux disease (GERD) or other gastrointestinal conditions.  Asthma or other breathing conditions.  Restless legs syndrome, sleep apnea, or other sleep disorders.  Chronic pain.  Menopause.  Stroke.  Abuse of alcohol, tobacco, or illegal drugs.  Mental health conditions, such as depression.  Caffeine.  Neurological disorders, such as Alzheimer's disease.  An overactive thyroid (hyperthyroidism). Sometimes, the cause of insomnia may not be known. What increases the risk? Risk factors for insomnia include:  Gender. Women are affected more often than men.  Age. Insomnia is more common as you get older.  Stress.  Lack of exercise.  Irregular work schedule or working night shifts.  Traveling between different time zones.  Certain  medical and mental health conditions. What are the signs or symptoms? If you have insomnia, the main symptom is having trouble falling asleep or having trouble staying asleep. This may lead to other symptoms, such as:  Feeling fatigued or having low energy.  Feeling nervous about going to sleep.  Not feeling rested in the morning.  Having trouble concentrating.  Feeling irritable, anxious, or depressed. How is this diagnosed? This condition may be diagnosed based on:  Your symptoms and medical history. Your health care provider may ask about: ? Your sleep habits. ? Any medical conditions you have. ? Your mental health.  A physical exam. How is this treated? Treatment for insomnia depends on the cause. Treatment may focus on treating an underlying condition that is causing insomnia. Treatment may also include:  Medicines to help you sleep.  Counseling or therapy.  Lifestyle adjustments to help you sleep better. Follow these instructions at home: Eating and drinking  Limit or avoid alcohol, caffeinated beverages, and cigarettes, especially close to bedtime. These can disrupt your sleep.  Do not eat a large meal or eat spicy foods right before bedtime. This can lead to digestive discomfort that can make it hard for you to sleep.   Sleep habits  Keep a sleep diary to help you and your health care provider figure out what could be causing your insomnia. Write down: ? When you sleep. ? When you wake up during the night. ? How well you sleep. ? How rested you feel the next day. ? Any side effects of medicines you are taking. ? What you eat and drink.  Make your bedroom a dark, comfortable place where it is easy to fall asleep. ? Put up shades or blackout curtains to block light from outside. ? Use a white noise machine to block noise. ? Keep the temperature cool.  Limit screen use before bedtime. This includes: ? Watching TV. ? Using your smartphone, tablet, or  computer.  Stick to a routine that includes going to bed and waking up at the same times every day and night. This can help you fall asleep faster. Consider making a quiet activity, such as reading, part of your nighttime routine.  Try to avoid taking naps during the day so that you sleep better at night.  Get out of bed if you are still awake after 15 minutes of trying to sleep. Keep the lights down, but try reading or doing a quiet activity. When you feel sleepy, go back to bed.   General instructions  Take over-the-counter and prescription medicines only as told by your health care provider.  Exercise regularly, as told by your health care provider. Avoid exercise starting several hours before bedtime.  Use relaxation techniques to manage stress. Ask your health care provider to suggest some techniques that may work well for you. These may include: ? Breathing exercises. ? Routines to release muscle tension. ? Visualizing peaceful scenes.  Make  sure that you drive carefully. Avoid driving if you feel very sleepy.  Keep all follow-up visits as told by your health care provider. This is important. Contact a health care provider if:  You are tired throughout the day.  You have trouble in your daily routine due to sleepiness.  You continue to have sleep problems, or your sleep problems get worse. Get help right away if:  You have serious thoughts about hurting yourself or someone else. If you ever feel like you may hurt yourself or others, or have thoughts about taking your own life, get help right away. You can go to your nearest emergency department or call:  Your local emergency services (911 in the [place]  A suicide crisis helpline, such as the National Suicide Prevention Lifeline at 3154611625. This is open 24 hours a day. Summary  Insomnia is a sleep disorder that makes it difficult to fall asleep or stay asleep.  Insomnia can be long-term (chronic) or short-term  (acute).  Treatment for insomnia depends on the cause. Treatment may focus on treating an underlying condition that is causing insomnia.  Keep a sleep diary to help you and your health care provider figure out what could be causing your insomnia. This information is not intended to replace advice given to you by your health care provider. Make sure you discuss any questions you have with your health care provider. Document Revised: 12/07/2019 Document Reviewed: 12/07/2019 [employer] Patient Education  2021 [employer].

## 2020-06-26 NOTE — Progress Notes (Signed)
SLEEP MEDICINE CLINIC    Provider:  Larey Seat, MD  Primary Care Physician:  Kathyrn Drown, [place] [address]     Referring Provider: Dr Jaynee Eagles, MD         Chief Complaint according to patient   Patient presents with:    . New Patient (Initial Visit)     Wakes up frequently with headaches. She had a sleep study early 2000's but doesn't remember what it indicated. She snores in sleep. Patient indicated she is only getting about 3 hrs a night of broken sleep.       HISTORY OF PRESENT ILLNESS:  [name] is a 55 y.o. African American female patient and is seen upon a referral on 06/26/2020 from Dr Jaynee Eagles  Chief concern according to patient :  ' I had a sleep study in the early 2000, and I may still have sleep apnea but I stopped using CPAP soon after I got it, I had no follow up".     [name]  has a past medical history of Allergies, Chronic cough, Chronic pain syndrome, Depression- chronic , Diabetes mellitus with complication of gastroparesis ([place]), Folliculitis (8/36/6294), GERD (gastroesophageal reflux disease), Hidradenitis suppurativa, High cholesterol, Hypertension, IBS (irritable bowel syndrome), chronic Migraine headache, Pulmonary nodule, Reactive airway disease (01/2018), and untreated Sleep apnea. The patient had the first sleep study in the year 2003 in [place] with a result of OSA, was prescribed CPAP .   Sleep relevant medical history: Nocturia 2-4 times , no Tonsillectomy, no ENT surgery but ental procedures- wisdom teeth extracted. Recent multiple frontal teeth removed, now needing dentures.      Family medical /sleep history: NO other family member on CPAP with OSA, insomnia, sleep walkers.    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 Family status is single with 1 child, 2 grandchildren.  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  Tobacco use; none .  ETOH use ; none ,Caffeine intake in form of Tea ( some days) or energy drinks. Regular exercise in form of walking. Hobbies : gardening.    Sleep habits are as follows: The patient's dinner time is between 6-8 PM. The patient goes to bed at variable PM and continues to sleep for 3-4 hours, wakes for  bathroom breaks,, and then can't go back easily to sleep. The preferred sleep position is sideways, with the support of 2 pillows.  Dreams are reportedly infrequent/vivid.  There is also no set or  usual rise time. The patient wakes up spontaneously- at about 10 AM or 11 AM .  She reports not feeling refreshed or restored in AM, with symptoms such as dry mouth, morning headaches, stiffness, joint pain and residual fatigue. Naps are taken infrequently, while watching TV - lasting up to 3 hours (!) .   Review of Systems: Out of a complete 14 system review, the patient complains of only the following symptoms, and all other reviewed systems are negative.:  Fatigue, sleepiness , snoring, fragmented sleep, Insomnia - both falling asleep and staying asleep, poor sleep routines, no set bed time, rise time.  Waking with her heart racing and clammy.    How likely are you to doze in the following situations: 0 = not likely, 1 = slight chance, 2 = moderate chance, 3 = high chance  Sitting and Reading? Watching Television? Sitting inactive in a public place (theater or meeting)? As a passenger in a car for an hour without a break? Lying down in the afternoon when circumstances permit? Sitting and talking to someone? Sitting quietly after lunch without alcohol? In a car, while stopped for a few minutes in traffic?   Total =  14/ 24 points   FSS endorsed at  49/ 63 points.  Depression - GDS at 12/ 15 - indicative of clinical depression !!!!  Social History   Socioeconomic History  . Marital status: Single    Spouse name: Not on file  . Number of  children: 1  . Years of education: Not on file  . Highest education level: High school graduate  Occupational History  . Occupation: unemployed  Tobacco Use  . Smoking status: Never Smoker  . Smokeless tobacco: Never Used  Vaping Use  . Vaping Use: Never used  Substance and Sexual Activity  . Alcohol use: No    Alcohol/week: 0.0 standard drinks  . Drug use: No  . Sexual activity: Not Currently    Birth control/protection: Surgical  Other Topics Concern  . Not on file  Social History Narrative   Lives alone   Right handed   Caffeine: maybe 1 cup of tea if she goes out to dinner. "I don't really do caffeine".   Social Determinants of Health   Financial Resource Strain: Not on file  Food Insecurity: Not on file  Transportation Needs: Not on file  Physical Activity: Not on file  Stress: Not on file  Social Connections: Not on file    Family History  Problem Relation Age of Onset  . Stroke Mother   . Hypertension Mother   . Diabetes Mother   . Hypercholesterolemia Mother   . Depression Mother   . Anxiety disorder Mother   . Thyroid disease Father   . Diabetes Father   . Hypertension Sister   . Hypertension Brother   . Cancer Paternal Grandfather        prostate  . Colon cancer Maternal Grandfather   . Asthma Brother   . Healthy Daughter   . Prostate cancer Other        a grandfather  . Migraines Neg Hx     Past Medical History:  Diagnosis Date  . Allergy   . Anxiety   . Arthritis   . Chronic cough   . Chronic low back pain   . Chronic pain syndrome   . Depression   . Diabetes mellitus without complication ([place])   . Folliculitis 09/27/2991  . GERD (gastroesophageal reflux disease)   . Hidradenitis suppurativa   . High cholesterol   . Hypertension   . IBS (irritable bowel syndrome)   . Lumbar spondylolysis   . Migraine   . Migraine headache   . Pulmonary nodule   . Reactive airway disease 01/2018   per PFT's  . Sleep apnea     Past Surgical  History:  Procedure Laterality Date  . ABDOMINAL HYSTERECTOMY    . COLONOSCOPY WITH PROPOFOL N/A 04/08/2015   Dr. Gala Romney: normal, repeat in 10 years   . ENDOMETRIAL ABLATION    . FOREIGN BODY REMOVAL N/A 05/26/2013   Procedure: FOREIGN BODY REMOVAL ADULT ABDOMINAL WALL;  Surgeon: Jamesetta So, MD;  Location: [facility];  Service: General;  Laterality: N/A;  . KNEE SURGERY Right 2012  . PILONIDAL CYST EXCISION  June 2011  . TUBAL LIGATION  Current Outpatient Medications on File Prior to Visit  Medication Sig Dispense Refill  . albuterol (VENTOLIN HFA) 108 (90 Base) MCG/ACT inhaler Inhale 2 puffs into the lungs every 4 (four) hours as needed. 1 each 5  . amLODipine (NORVASC) 5 MG tablet Take 1 tablet (5 mg total) by mouth daily. 90 tablet 1  . Ascorbic Acid (VITAMIN C PO) Take 250 mg by mouth.    . blood glucose meter kit and supplies Dispense based on patient and insurance preference. Use to test sugar once a day. For dx E11.9 1 each 11  . famotidine (PEPCID) 20 MG tablet Take 1 tablet (20 mg total) by mouth 2 (two) times daily. 60 tablet 5  . FLUoxetine (PROZAC) 20 MG tablet 2 qd 60 tablet 5  . fluticasone (FLONASE) 50 MCG/ACT nasal spray Place 2 sprays into both nostrils daily as needed for allergies or rhinitis. 16 g 5  . Fremanezumab-vfrm (AJOVY) 225 MG/1.5ML SOAJ Inject 225 mg into the skin every 30 (thirty) days. 3 pen 4  . gabapentin (NEURONTIN) 100 MG capsule TAKE 1 CAPSULE BY MOUTH THREE TIMES DAILY (Patient taking differently: Take 100 mg by mouth daily.) 270 capsule 0  . Insulin Pen Needle (RELION PEN NEEDLES) 31G X 6 MM MISC AS DIRECTED FOR  VICTOZA  PEN 100 each 0  . losartan (COZAAR) 25 MG tablet Take 1 tablet (25 mg total) by mouth daily. 90 tablet 0  . meloxicam (MOBIC) 15 MG tablet Take 1 tablet (15 mg total) by mouth daily. 90 tablet 0  . metoprolol tartrate (LOPRESSOR) 50 MG tablet Take 1 tablet by mouth twice daily 180 tablet 0  . Multiple Vitamins-Minerals (MULTIPLE  VITAMINS/WOMENS PO) Take by mouth.    . mupirocin ointment (BACTROBAN) 2 % Apply thin amount to rash bid prn 44 g 2  . rosuvastatin (CRESTOR) 5 MG tablet TAKE 1 TABLET BY MOUTH ONCE DAILY FOR CHOLESTEROL 90 tablet 1  . triamterene-hydrochlorothiazide (MAXZIDE-25) 37.5-25 MG tablet Take 1 tablet by mouth once daily 90 tablet 0  . VICTOZA 18 MG/3ML SOPN INJECT 0.3MLS (1.8MG TOTAL) INTO THE SKIN DAILY 27 mL 5  . VITAMIN D PO Take by mouth.     No current facility-administered medications on file prior to visit.  Dr Wolfgang Phoenix doubled the dose of Prozac to 60 mg in February 2022.   Allergies  Allergen Reactions  . Bactrim [Sulfamethoxazole-Trimethoprim] Rash  . Lisinopril     Dry cough  . Other     Seasonal allergies  . Metformin And Related     Felt sick and nervous    Physical exam:  Today's Vitals   06/26/20 1258  BP: 128/83  Pulse: 69  Weight: 214 lb (97.1 kg)  Height: 5' 5"  (1.651 m)   Body mass index is 35.61 kg/m.   Wt Readings from Last 3 Encounters:  06/26/20 214 lb (97.1 kg)  06/07/20 220 lb (99.8 kg)  06/03/20 220 lb 12.8 oz (100.2 kg)     Ht Readings from Last 3 Encounters:  06/26/20 5' 5"  (1.651 m)  06/03/20 5' 5"  (1.651 m)  05/24/20 5' 5"  (1.651 m)      General: The patient is awake, alert and appears not in acute distress. The patient is well groomed. Head: Normocephalic, atraumatic.  Neck is supple.  Mallampati  3 plus ,  neck circumference:15.5  inches . Nasal airflow patent, but seasonally congested. .   Retrognathia is not seen.  Dental status: poor , the lower teeth are  spaced far apart, the upper teeth were removed.  Cardiovascular:  Regular rate and cardiac rhythm by pulse,  without distended neck veins. Respiratory: Lungs are clear to auscultation.  Skin:  Without evidence of ankle edema, or rash. Trunk: The patient's posture is erect.   Neurologic exam : The patient is awake and alert, oriented to place and time.   Memory subjective described  as intact.  Attention span & concentration ability appears normal.  Speech is fluent,  without  dysarthria, dysphonia or aphasia.  Mood and affect are appropriate.   Cranial nerves: no loss of smell or taste reported  Pupils are equal and briskly reactive to light. Funduscopic exam deferred. Reading glasses. .  Extraocular movements in vertical and horizontal planes were intact and without nystagmus. No Diplopia. Visual fields by finger perimetry are intact. Hearing was intact to soft voice and finger rubbing.   Facial sensation intact to fine touch.  Facial motor strength is symmetric and tongue and uvula move midline.  Neck ROM : rotation, tilt and flexion extension were normal for age and shoulder shrug was symmetrical.    Motor exam:  Symmetric bulk, tone and ROM.   Normal tone without cog wheeling, symmetric grip strength .   Sensory:  both hands tingle in the morning, sometimes her feet do too.  Fine touch and vibration were intact at ankle level.  Proprioception tested in the upper extremities was normal.   Coordination: Rapid alternating movements in the fingers/hands were of normal speed.  The Finger-to-nose maneuver was intact without evidence of ataxia, dysmetria or tremor.   Gait and station: Patient could rise unassisted from a seated position, but needed to brace herself.  She walked without assistive device.  Stance is of normal width/ base and the patient turned with 3 steps.  Toe and heel walk were deferred.  Deep tendon reflexes: in the  upper and lower extremities are symmetric and intact.  Babinski response was deferred .        After spending a total time of 40 minutes face to face and additional time for physical and neurologic examination, review of laboratory studies,  personal review of imaging studies, reports and results of other testing and review of referral information / records as far as provided in visit, I have established the following  assessments:    1) Sleep hygiene : poor sleep habits, no routines for bed time, dinnertime, rise time and ,sometimes, taking naps of 3 hour duration. Former Medical illustrator , but mainly related to depression.  2) high fatigue and elevated degree of daytime sleepiness- depression,  Insomnia with depression and chronic pain.  3) sleep related headaches, woken by and waking with headaches- both types.  4) snoring confirmed by her grandchildren.    My Plan is to proceed with:  1) sleep hygiene instruction - set a rise time, set a bedtime, keep only 7 hours between these two, hot bath or shower before bedtime, no caffeine in PM. Avoid all naps. No TV in bedroom, no blue light or screens in bedroom.  2) prefer an attended sleep study to screen of OSA and to look for PLM related arousals.     I would like to thankLuk[place], [name], [name],  [name] [id number] for allowing me to meet with and to take care of this pleasant patient.   In short, [name] is presenting with depression and chonic pain related insomnia, poor sleep habits., high fatigue and  sleepiness.   The follow up will be through our NP within 2-4 month.   CC: I will share my notes with Dr Jaynee Eagles and PCP.   Electronically signed by: Larey Seat, MD 06/26/2020 1:09 PM  [facility] [employer] Board certified by The [employer] of Sleep Medicine and Diplomate of the [employer] of Sleep Medicine. Board certified In Neurology through the [place], Fellow of the [employer] of Neurology. Medical Director of [employer].

## 2020-06-27 ENCOUNTER — Other Ambulatory Visit: Payer: Self-pay

## 2020-06-27 ENCOUNTER — Encounter: Payer: Medicare HMO | Admitting: Rehabilitative and Restorative Service Providers"

## 2020-07-02 ENCOUNTER — Encounter: Payer: Medicare HMO | Admitting: Rehabilitative and Restorative Service Providers"

## 2020-07-04 ENCOUNTER — Encounter: Payer: Self-pay | Admitting: Rehabilitative and Restorative Service Providers"

## 2020-07-04 ENCOUNTER — Other Ambulatory Visit: Payer: Self-pay

## 2020-07-04 ENCOUNTER — Ambulatory Visit (INDEPENDENT_AMBULATORY_CARE_PROVIDER_SITE_OTHER): Payer: Medicare HMO | Admitting: Rehabilitative and Restorative Service Providers"

## 2020-07-04 DIAGNOSIS — M545 Low back pain, unspecified: Secondary | ICD-10-CM

## 2020-07-04 DIAGNOSIS — M542 Cervicalgia: Secondary | ICD-10-CM | POA: Diagnosis not present

## 2020-07-04 DIAGNOSIS — M6281 Muscle weakness (generalized): Secondary | ICD-10-CM | POA: Diagnosis not present
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
## 2020-07-09 ENCOUNTER — Other Ambulatory Visit: Payer: Self-pay

## 2020-07-09 ENCOUNTER — Ambulatory Visit (INDEPENDENT_AMBULATORY_CARE_PROVIDER_SITE_OTHER): Payer: Medicare HMO | Admitting: Rehabilitative and Restorative Service Providers"

## 2020-07-09 ENCOUNTER — Encounter: Payer: Self-pay | Admitting: Rehabilitative and Restorative Service Providers"

## 2020-07-09 DIAGNOSIS — M6281 Muscle weakness (generalized): Secondary | ICD-10-CM | POA: Diagnosis not present

## 2020-07-09 DIAGNOSIS — M542 Cervicalgia: Secondary | ICD-10-CM

## 2020-07-09 DIAGNOSIS — M545 Low back pain, unspecified: Secondary | ICD-10-CM | POA: Diagnosis not present

## 2020-07-09 NOTE — Patient Instructions (Signed)
Access Code: [email or web address] URL: [email or web address] Date: 07/09/2020 Prepared by: Chyrel Masson  Exercises Seated Assisted Cervical Rotation with Towel - 3 x daily - 7 x weekly - 5 reps - 10 seconds hold Seated Upper Trap Stretch - 3 x daily - 7 x weekly - 5 reps - 10 seconds hold Seated Cervical Retraction - 3 x daily - 7 x weekly - 10 reps - 5 seconds hold Hooklying Single Knee to Chest Stretch - 3 x daily - 7 x weekly - 3 reps - 20 seconds hold Supine Bridge - 3 x daily - 7 x weekly - 10 reps - 5 seconds hold Supine Lower Trunk Rotation - 3 x daily - 7 x weekly - 5 reps - 10 seconds hold Standing Shoulder Row with Anchored Resistance - 2 x daily - 7 x weekly - 1 sets - 20 reps - 5 hold Shoulder Extension with Resistance - 2 x daily - 7 x weekly - 1 sets - 20 reps - 5 hold
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
## 2020-07-10 ENCOUNTER — Telehealth: Payer: Self-pay

## 2020-07-10 NOTE — Telephone Encounter (Signed)
LVM for pt to call me back to schedule sleep study  

## 2020-07-11 ENCOUNTER — Other Ambulatory Visit: Payer: Self-pay

## 2020-07-11 ENCOUNTER — Ambulatory Visit (INDEPENDENT_AMBULATORY_CARE_PROVIDER_SITE_OTHER): Payer: Medicare HMO | Admitting: Rehabilitative and Restorative Service Providers"

## 2020-07-11 ENCOUNTER — Encounter: Payer: Self-pay | Admitting: Rehabilitative and Restorative Service Providers"

## 2020-07-11 DIAGNOSIS — M6281 Muscle weakness (generalized): Secondary | ICD-10-CM

## 2020-07-11 DIAGNOSIS — M545 Low back pain, unspecified: Secondary | ICD-10-CM | POA: Diagnosis not present

## 2020-07-11 DIAGNOSIS — M542 Cervicalgia: Secondary | ICD-10-CM
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
## 2020-07-15 ENCOUNTER — Other Ambulatory Visit: Payer: Self-pay

## 2020-07-15 ENCOUNTER — Ambulatory Visit (INDEPENDENT_AMBULATORY_CARE_PROVIDER_SITE_OTHER): Payer: Medicare HMO | Admitting: Family Medicine

## 2020-07-15 DIAGNOSIS — M542 Cervicalgia: Secondary | ICD-10-CM | POA: Diagnosis not present

## 2020-07-15 DIAGNOSIS — M545 Low back pain, unspecified: Secondary | ICD-10-CM
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
## 2020-07-24 ENCOUNTER — Other Ambulatory Visit: Payer: Self-pay | Admitting: Family Medicine

## 2020-07-24 ENCOUNTER — Ambulatory Visit (INDEPENDENT_AMBULATORY_CARE_PROVIDER_SITE_OTHER): Payer: Medicare HMO | Admitting: Neurology

## 2020-07-24 DIAGNOSIS — F339 Major depressive disorder, recurrent, unspecified: Secondary | ICD-10-CM

## 2020-07-24 DIAGNOSIS — F5105 Insomnia due to other mental disorder: Secondary | ICD-10-CM

## 2020-07-24 DIAGNOSIS — R519 Headache, unspecified: Secondary | ICD-10-CM

## 2020-07-24 DIAGNOSIS — G4719 Other hypersomnia: Secondary | ICD-10-CM

## 2020-07-24 DIAGNOSIS — G4733 Obstructive sleep apnea (adult) (pediatric): Secondary | ICD-10-CM | POA: Diagnosis not present

## 2020-07-24 DIAGNOSIS — G8929 Other chronic pain: Secondary | ICD-10-CM

## 2020-07-24 DIAGNOSIS — Z72821 Inadequate sleep hygiene: Secondary | ICD-10-CM

## 2020-07-24 DIAGNOSIS — G44019 Episodic cluster headache, not intractable: Secondary | ICD-10-CM

## 2020-07-25 NOTE — Progress Notes (Signed)
[facility]  HOME SLEEP TEST (Watch PAT)   STUDY DATE: 07-24-20  DOB: 22-Dec-1965  MRN: [id number]  ORDERING CLINICIAN: Melvyn Novas, MD   REFERRING CLINICIAN: Babs Sciara, MD   CLINICAL INFORMATION/HISTORY: [name]  has a medical history of Allergies, Chronic cough, Chronic pain syndrome, Depression- chronic , Diabetes mellitus with complication of gastroparesis (HCC), Folliculitis (07/20/2012), GERD (gastroesophageal reflux disease), Hidradenitis suppurativa, High cholesterol, Hypertension, Obesity, IBS (irritable bowel syndrome), chronic Migraine headache, Pulmonary nodule, Reactive airway disease (01/2018), and untreated Sleep apnea.  The patient had the first sleep study in the year 2003 in [facility] and received a diagnosis of OSA, was prescribed CPAP. The patient reports excessive daytime sleepiness, hypersomnia and non-restorative sleep.   Epworth sleepiness score: 14/24. BMI: 35.6 kg/m Neck Circumference: 15.5 "  Sleep Summary:   Total Recording Time (hours, min): 10  h 3 min Total Sleep Time (hours, min):   8   h 14 min  Percent REM (%):    8.59%   Respiratory Indices:   Calculated pAHI (per hour):   13.9/hour        REM pAHI:                              29.7/hour      NREM pAHI:                         12.6/hour Supine AHI:                              18.4 Non-Supine AHI :                      9.1          Oxygen Saturation Statistics:    Mean Oxygen Saturation (%)                    96%  Minimum oxygen saturation (%):              91%  O2 Saturation Range (%):                        91-100%   O2 Saturation (minutes) <89%:        0 min  Pulse Rate Statistics:   Pulse Mean (bpm): 50; Pulse Range between 43-71 bpm.  IMPRESSION: This HST confirmed mild OSA (obstructive sleep apnea) to be present, there is REM sleep and supine sleep position related exacerbation found as well. Sleep breathing was associated with loud snoring.   There was no evidence of hypoxia and the heart rate was in normal range.   RECOMMENDATION: 1) REM sleep dependent apnea responds best to Positive Airway Pressure therapy, such as CPAP.  CPAP autotitration device with a pressure setting from 5- 13 cm water, 2 cm EPR and interface of patients choice is recommended.  2)This may only be necessary until weight loss las been accomplished. Weight loss is effective to reduce snoring and apnea.  3) avoiding left lateral and supine sleep, as the right  lateral sleep position was the least apnea prone.  4) improve sleep quality by establishing routines and avoiding screen light, electronic devices before bedtime.    INTERPRETING PHYSICIAN:  Melvyn Novas, MD    [facility] and [facility] Sleep Board  certified by The [employer] of Sleep Medicine and Diplomate of the [employer] of Sleep Medicine. Board certified In Neurology through the ABPN, Fellow of the [employer] of Neurology. Medical Director of [employer].   Sleep Summary   Start Study Time: End Study Time: Total Recording Time:       11:20:09 PM 9:24:08 AM 10 h, 3 min  Total Sleep Time % REM of Sleep Time:  8 h, 14 min  8.6     Respiratory Indices     Total Events REM NREM All Night  pRDI: pAHI 3%: ODI 4%: pAHIc 3%: % CSR: pAHI 4%:  144  111  48  3 0.0 51 29.7 29.7 12.5 0.0 17.1 12.6 5.5 0.4 18.1 13.9 6.0 0.4 0.0 6.4     Oxygen Saturation Statistics   Mean: 96 Minimum: 91 Maximum: 100  Mean of Desaturations Nadirs (%):   94  Oxygen Desatur. %: 4-9 10-20 >20 Total  Events Number Total  48 100.0  0 0.0  0 0.0  48 100.0  Oxygen Saturation: <90 <=88 <85 <80 <70  Duration (minutes): Sleep % 0.0 0.0 0.0 0.0 0.0 0.0 0.0 0.0 0.0 0.0     Pulse Rate Statistics during Sleep (BPM)     Mean: 50 Minimum: 43 Maximum: 71       Body Position Statistics  Position Supine Prone Right Left Non-Supine  Sleep (min)  260.0 44.5 80.0 110.0 234.5  Sleep % 52.6 9.0 16.2 22.2 47.4  pRDI 22.7 9.7 6.8 19.0 13.1  pAHI 3% 18.4 8.3 2.3 14.5 9.1  ODI 4% 9.9 0.0 0.0 3.9 1.8     Snoring Statistics Snoring Level (dB) >40 >50 >60 >70 >80 >Threshold (45)  Sleep (min) 261.5 11.5 4.2 0.0 0.0 27.9  Sleep % 52.9 2.3 0.8 0.0 0.0 5.6

## 2020-07-26 NOTE — Progress Notes (Signed)
IMPRESSION: This HST confirmed mild OSA (obstructive sleep apnea) to be present, there is REM sleep and supine sleep position related exacerbation found as well. Sleep breathing was associated with loud snoring. There was no evidence of hypoxia and the heart rate was in normal range.  RECOMMENDATION: 1) REM sleep dependent apnea responds best to Positive Airway Pressure therapy, such as CPAP. Headaches can improve as well.  Use of a CPAP autotitration device with a pressure setting from 5- 13 cm water, 2 cm EPR and interface of patients choice is recommended. 2)This may only be necessary until weight loss las been accomplished. Weight loss is effective to reduce snoring and apnea. 3) I recommend avoiding left lateral and supine sleep, as the right lateral sleep position was the least apnea prone. 4) Improve sleep quality by establishing routines and avoiding screen light, electronic devices before bedtime.   INTERPRETING PHYSICIAN: Melvyn Novas, MD  [facility] and [facility] Sleep Board certified by The [employer] of Sleep Medicine and Diplomate of the [employer] of [employer] Medicine.

## 2020-07-26 NOTE — Addendum Note (Signed)
Addended by: Melvyn Novas on: 07/26/2020 07:45 PM   Modules accepted: Orders

## 2020-07-26 NOTE — Procedures (Signed)
[facility]  HOME SLEEP TEST (Watch PAT)   STUDY DATE: 07-24-20  DOB: 19-May-1965  MRN: [id number]  ORDERING CLINICIAN: Melvyn Novas, MD   REFERRING CLINICIAN: Naomie Dean, MD   CLINICAL INFORMATION/HISTORY: [name] has a medical history of Allergies, Chronic cough, Chronic pain syndrome, Depression- chronic , Diabetes mellitus with complication of gastroparesis (HCC), Folliculitis (07/20/2012), GERD (gastroesophageal reflux disease), Hidradenitis suppurativa, High cholesterol, Hypertension, Obesity, IBS (irritable bowel syndrome), chronic Migraine headache, Pulmonary nodule, Reactive airway disease (01/2018), and untreated Sleep apnea.  The patient had the first sleep study in the year 2003 in [place] and received a diagnosis of OSA, was prescribed CPAP. The patient reports excessive daytime sleepiness, hypersomnia and non-restorative sleep.   Epworth sleepiness score: 14/24. BMI: 35.6 kg/m Neck Circumference: 15.5 "  Sleep Summary:   Total Recording Time (hours, min): 10  h 3 min Total Sleep Time (hours, min):   8   h 14 min  Percent REM (%):    8.59%   Respiratory Indices:   Calculated pAHI (per hour):   13.9/hour        REM pAHI:                              29.7/hour      NREM pAHI:                         12.6/hour Supine AHI:                              18.4 Non-Supine AHI :                      9.1          Oxygen Saturation Statistics:    Mean Oxygen Saturation (%)                    96%  Minimum oxygen saturation (%):              91%  O2 Saturation Range (%):                        91-100%   O2 Saturation (minutes) <89%:        0 min  Pulse Rate Statistics:   Pulse Mean (bpm): 50; Pulse Range between 43-71 bpm.  IMPRESSION: This HST confirmed mild OSA (obstructive sleep apnea) to be present, there is REM sleep and supine sleep position related exacerbation found as well. Sleep breathing was associated with loud snoring.  There  was no evidence of hypoxia and the heart rate was in normal range.   RECOMMENDATION: 1) REM sleep dependent apnea responds best to Positive Airway Pressure therapy, such as CPAP.  CPAP autotitration device with a pressure setting from 5- 13 cm water, 2 cm EPR and interface of patients choice is recommended.  2)This may only be necessary until weight loss las been accomplished. Weight loss is effective to reduce snoring and apnea.  3) avoiding left lateral and supine sleep, as the right  lateral sleep position was the least apnea prone.  4) improve sleep quality by establishing routines and avoiding screen light, electronic devices before bedtime.    INTERPRETING PHYSICIAN:  Melvyn Novas, MD    [facility] and [facility] Sleep Board certified by The  American Board of Sleep Medicine and Diplomate of the [employer] Sleep Medicine. Board certified In Neurology through the ABPN, Fellow of the [employer] of Neu[employer]. Medical Director of [employer].   Sleep Summary   Start Study Time: End Study Time: Total Recording Time:       11:20:09 PM 9:24:08 AM 10 h, 3 min  Total Sleep Time % REM of Sleep Time:  8 h, 14 min  8.6     Respiratory Indices     Total Events REM NREM All Night  pRDI: pAHI 3%: ODI 4%: pAHIc 3%: % CSR: pAHI 4%:  144  111  48  3 0.0 51 29.7 29.7 12.5 0.0 17.1 12.6 5.5 0.4 18.1 13.9 6.0 0.4 0.0 6.4     Oxygen Saturation Statistics   Mean: 96 Minimum: 91 Maximum: 100  Mean of Desaturations Nadirs (%):   94  Oxygen Desatur. %: 4-9 10-20 >20 Total  Events Number Total  48 100.0  0 0.0  0 0.0  48 100.0  Oxygen Saturation: <90 <=88 <85 <80 <70  Duration (minutes): Sleep % 0.0 0.0 0.0 0.0 0.0 0.0 0.0 0.0 0.0 0.0     Pulse Rate Statistics during Sleep (BPM)     Mean: 50 Minimum: 43 Maximum: 71       Body Position Statistics  Position Supine Prone Right Left Non-Supine  Sleep (min) 260.0 44.5  80.0 110.0 234.5  Sleep % 52.6 9.0 16.2 22.2 47.4  pRDI 22.7 9.7 6.8 19.0 13.1  pAHI 3% 18.4 8.3 2.3 14.5 9.1  ODI 4% 9.9 0.0 0.0 3.9 1.8     Snoring Statistics Snoring Level (dB) >40 >50 >60 >70 >80 >Threshold (45)  Sleep (min) 261.5 11.5 4.2 0.0 0.0 27.9  Sleep % 52.9 2.3 0.8 0.0 0.0 5.6

## 2020-07-29 ENCOUNTER — Encounter: Payer: Self-pay | Admitting: Neurology

## 2020-07-29 ENCOUNTER — Telehealth: Payer: Self-pay | Admitting: Neurology

## 2020-07-29 NOTE — Telephone Encounter (Signed)
Attempted to call the patient to review SSR. It went straight to a message stating the pt has not set up a VM. Will send a mychart message to the patient letting her know we are trying to contact

## 2020-07-29 NOTE — Telephone Encounter (Signed)
-----   Message from Melvyn Novas, MD sent at 07/26/2020  7:45 PM EDT ----- IMPRESSION: This HST confirmed mild OSA (obstructive sleep apnea) to be present, there is REM sleep and supine sleep position related exacerbation found as well. Sleep breathing was associated with loud snoring. There was no evidence of hypoxia and the heart rate was in normal range.  RECOMMENDATION: 1) REM sleep dependent apnea responds best to Positive Airway Pressure therapy, such as CPAP. Headaches can improve as well.  Use of a CPAP autotitration device with a pressure setting from 5- 13 cm water, 2 cm EPR and interface of patients choice is recommended. 2)This may only be necessary until weight loss las been accomplished. Weight loss is effective to reduce snoring and apnea. 3) I recommend avoiding left lateral and supine sleep, as the right lateral sleep position was the least apnea prone. 4) Improve sleep quality by establishing routines and avoiding screen light, electronic devices before bedtime.   INTERPRETING PHYSICIAN: Melvyn Novas, MD  [facility] and [facility] Sleep Board certified by The [employer] of Sleep Medicine and Diplomate of the [employer] of Sleep Medicine.

## 2020-08-06 NOTE — Telephone Encounter (Signed)
Attempted for a 3rd time to call the patient and the call continues to go straight to a voicemail box that has not been set up. I will send a letter to the patient for her to contact the office.

## 2020-08-09 ENCOUNTER — Other Ambulatory Visit: Payer: Self-pay | Admitting: Nurse Practitioner

## 2020-08-09 ENCOUNTER — Other Ambulatory Visit: Payer: Self-pay | Admitting: Family Medicine

## 2020-08-09 NOTE — Telephone Encounter (Signed)
6 months medicine refill please

## 2020-08-19 ENCOUNTER — Encounter: Payer: Self-pay | Admitting: Neurology

## 2020-08-19 NOTE — Telephone Encounter (Signed)
Pt returned call. I advised pt that Dr. Vickey Huger reviewed their sleep study results and found that pt has sleep apnea. Dr. Vickey Huger recommends that pt starts auto CPAP. I reviewed PAP compliance expectations with the pt. Pt is agreeable to starting a CPAP. I advised pt that an order will be sent to a DME, [facility] and [facility] will call the pt within about one week after they file with the pt's insurance. [facility]) will show the pt how to use the machine, fit for masks, and troubleshoot the CPAP if needed. A follow up appt was made for insurance purposes with Shawnie Dapper, NP on 12/02/2020 at 2:30 pm. Pt verbalized understanding to arrive 15 minutes early and bring their CPAP. A letter with all of this information in it will be mailed to the pt as a reminder. I verified with the pt that the address we have on file is correct. Pt verbalized understanding of results. Pt had no questions at this time but was encouraged to call back if questions arise. I have sent the order to [facility]) and have received confirmation that they have received the order.

## 2020-09-03 ENCOUNTER — Encounter: Payer: Self-pay | Admitting: Neurology

## 2020-09-03 ENCOUNTER — Ambulatory Visit: Payer: Medicare HMO | Admitting: Neurology

## 2020-09-03 VITALS — Ht 65.0 in

## 2020-09-03 DIAGNOSIS — G43709 Chronic migraine without aura, not intractable, without status migrainosus: Secondary | ICD-10-CM

## 2020-09-03 NOTE — Progress Notes (Signed)
Botox- 200 units x 1 vial Lot: [id number] Expiration: 02/2023 NDC: 0023-3921-02  Bacteriostatic 0.9% Sodium Chloride- 4mL total Lot: [id number] Expiration: 02/09/2022 NDC: 0409-1966-02  Dx: G43.709 B/B  

## 2020-09-03 NOTE — Progress Notes (Signed)
Consent Form Botulism Toxin Injection For Chronic Migraine   09/03/2020: Did extremely well, > 50% improvement, also felt better not as nervous  First botox. +5u each masseter in addition. Extra spread it over the left temporal area where her migraines start. Did it high in the forehead to avoid droop, ask her how it went we can always do them higher in the hairline if needed. Can ask about orb occuli next time.  She was extremely nervous, sweaty, tearing up, excessive fear of needles, I did give her some Xanax for next time, advised her not to drive, try taking even half a pill, she may even want to try taking it at home 1 evening just to make sure it does not cause her extreme sedation before taking it prior to coming to the office for  No orders of the defined types were placed in this encounter.    Reviewed orally with patient, additionally signature is on file:  Botulism toxin has been approved by the Federal drug administration for treatment of chronic migraine. Botulism toxin does not cure chronic migraine and it may not be effective in some patients.  The administration of botulism toxin is accomplished by injecting a small amount of toxin into the muscles of the neck and head. Dosage must be titrated for each individual. Any benefits resulting from botulism toxin tend to wear off after 3 months with a repeat injection required if benefit is to be maintained. Injections are usually done every 3-4 months with maximum effect peak achieved by about 2 or 3 weeks. Botulism toxin is expensive and you should be sure of what costs you will incur resulting from the injection.  The side effects of botulism toxin use for chronic migraine may include:   -Transient, and usually mild, facial weakness with facial injections  -Transient, and usually mild, head or neck weakness with head/neck injections  -Reduction or loss of forehead facial animation due to forehead muscle weakness  -Eyelid  drooping  -Dry eye  -Pain at the site of injection or bruising at the site of injection  -Double vision  -Potential unknown long term risks  Contraindications: You should not have Botox if you are pregnant, nursing, allergic to albumin, have an infection, skin condition, or muscle weakness at the site of the injection, or have myasthenia gravis, Lambert-Eaton syndrome, or ALS.  It is also possible that as with any injection, there may be an allergic reaction or no effect from the medication. Reduced effectiveness after repeated injections is sometimes seen and rarely infection at the injection site may occur. All care will be taken to prevent these side effects. If therapy is given over a long time, atrophy and wasting in the muscle injected may occur. Occasionally the patient's become refractory to treatment because they develop antibodies to the toxin. In this event, therapy needs to be modified.  I have read the above information and consent to the administration of botulism toxin.    BOTOX PROCEDURE NOTE FOR MIGRAINE HEADACHE    Contraindications and precautions discussed with patient(above). Aseptic procedure was observed and patient tolerated procedure. Procedure performed by Dr. Artemio Aly  The condition has existed for more than 6 months, and pt does not have a diagnosis of ALS, Myasthenia Gravis or Lambert-Eaton Syndrome.  Risks and benefits of injections discussed and pt agrees to proceed with the procedure.  Written consent obtained  These injections are medically necessary. Pt  receives good benefits from these injections. These injections do not cause  sedations or hallucinations which the oral therapies may cause.  Description of procedure:  The patient was placed in a sitting position. The standard protocol was used for Botox as follows, with 5 units of Botox injected at each site:   -Procerus muscle, midline injection  -Corrugator muscle, bilateral  injection  -Frontalis muscle, bilateral injection, with 2 sites each side, medial injection was performed in the upper one third of the frontalis muscle, in the region vertical from the medial inferior edge of the superior orbital rim. The lateral injection was again in the upper one third of the forehead vertically above the lateral limbus of the cornea, 1.5 cm lateral to the medial injection site.  -Temporalis muscle injection, 4 sites, bilaterally. The first injection was 3 cm above the tragus of the ear, second injection site was 1.5 cm to 3 cm up from the first injection site in line with the tragus of the ear. The third injection site was 1.5-3 cm forward between the first 2 injection sites. The fourth injection site was 1.5 cm posterior to the second injection site.   -Occipitalis muscle injection, 3 sites, bilaterally. The first injection was done one half way between the occipital protuberance and the tip of the mastoid process behind the ear. The second injection site was done lateral and superior to the first, 1 fingerbreadth from the first injection. The third injection site was 1 fingerbreadth superiorly and medially from the first injection site.  -Cervical paraspinal muscle injection, 2 sites, bilateral knee first injection site was 1 cm from the midline of the cervical spine, 3 cm inferior to the lower border of the occipital protuberance. The second injection site was 1.5 cm superiorly and laterally to the first injection site.  -Trapezius muscle injection was performed at 3 sites, bilaterally. The first injection site was in the upper trapezius muscle halfway between the inflection point of the neck, and the acromion. The second injection site was one half way between the acromion and the first injection site. The third injection was done between the first injection site and the inflection point of the neck.   Will return for repeat injection in 3 months.   200 units of Botox was  used, any Botox not injected was wasted. The patient tolerated the procedure well, there were no complications of the above procedure.

## 2020-09-24 ENCOUNTER — Other Ambulatory Visit: Payer: Self-pay | Admitting: Family Medicine

## 2020-09-24 DIAGNOSIS — E1169 Type 2 diabetes mellitus with other specified complication: Secondary | ICD-10-CM

## 2020-09-26 ENCOUNTER — Other Ambulatory Visit: Payer: Self-pay

## 2020-09-26 ENCOUNTER — Other Ambulatory Visit: Payer: Self-pay | Admitting: Neurology

## 2020-09-26 DIAGNOSIS — G43709 Chronic migraine without aura, not intractable, without status migrainosus: Secondary | ICD-10-CM

## 2020-09-26 MED ORDER — FAMOTIDINE 20 MG PO TABS: 20.0000 mg | ORAL_TABLET | Freq: Two times a day (BID) | ORAL | 5 refills | Status: DC

## 2020-09-26 NOTE — Telephone Encounter (Signed)
Please order lipid, liver, metabolic 7, urine ACR, A1c, patient to do the lab work before office visit in October may have 90-day refill

## 2020-09-27 ENCOUNTER — Other Ambulatory Visit: Payer: Self-pay

## 2020-09-27 DIAGNOSIS — I10 Essential (primary) hypertension: Secondary | ICD-10-CM

## 2020-09-27 DIAGNOSIS — E1169 Type 2 diabetes mellitus with other specified complication: Secondary | ICD-10-CM

## 2020-09-27 DIAGNOSIS — E785 Hyperlipidemia, unspecified: Secondary | ICD-10-CM

## 2020-09-27 NOTE — Telephone Encounter (Signed)
Left message for a return call to inform per drs notes  

## 2020-10-03 DIAGNOSIS — E785 Hyperlipidemia, unspecified: Secondary | ICD-10-CM | POA: Diagnosis not present

## 2020-10-03 DIAGNOSIS — I10 Essential (primary) hypertension: Secondary | ICD-10-CM | POA: Diagnosis not present

## 2020-10-03 DIAGNOSIS — E1169 Type 2 diabetes mellitus with other specified complication: Secondary | ICD-10-CM | POA: Diagnosis not present

## 2020-10-04 LAB — HEMOGLOBIN A1C
Est. average glucose Bld gHb Est-mCnc: 134 mg/dL
Hgb A1c MFr Bld: 6.3 % — ABNORMAL HIGH (ref 4.8–5.6)

## 2020-10-04 LAB — BASIC METABOLIC PANEL
BUN/Creatinine Ratio: 11 (ref 9–23)
BUN: 11 mg/dL (ref 6–24)
CO2: 24 mmol/L (ref 20–29)
Calcium: 10.2 mg/dL (ref 8.7–10.2)
Chloride: 98 mmol/L (ref 96–106)
Creatinine, Ser: 1.04 mg/dL — ABNORMAL HIGH (ref 0.57–1.00)
Glucose: 113 mg/dL — ABNORMAL HIGH (ref 65–99)
Potassium: 3.9 mmol/L (ref 3.5–5.2)
Sodium: 140 mmol/L (ref 134–144)
eGFR: 63 mL/min/{1.73_m2} (ref >59)

## 2020-10-04 LAB — HEPATIC FUNCTION PANEL
ALT: 19 IU/L (ref 0–32)
AST: 22 IU/L (ref 0–40)
Albumin: 4.8 g/dL (ref 3.8–4.9)
Alkaline Phosphatase: 72 IU/L (ref 44–121)
Bilirubin Total: 0.5 mg/dL (ref 0.0–1.2)
Bilirubin, Direct: 0.16 mg/dL (ref 0.00–0.40)
Total Protein: 7.8 g/dL (ref 6.0–8.5)

## 2020-10-04 LAB — MICROALBUMIN / CREATININE URINE RATIO
Creatinine, Urine: 164.6 mg/dL
Microalb/Creat Ratio: 31 mg/g creat — ABNORMAL HIGH (ref 0–29)
Microalbumin, Urine: 51.6 ug/mL

## 2020-10-04 LAB — LIPID PANEL
Chol/HDL Ratio: 2.7 ratio (ref 0.0–4.4)
Cholesterol, Total: 107 mg/dL (ref 100–199)
HDL: 39 mg/dL — ABNORMAL LOW (ref >39)
LDL Chol Calc (NIH): 51 mg/dL (ref 0–99)
Triglycerides: 88 mg/dL (ref 0–149)
VLDL Cholesterol Cal: 17 mg/dL (ref 5–40)

## 2020-10-08 ENCOUNTER — Ambulatory Visit (HOSPITAL_COMMUNITY)
Admission: RE | Admit: 2020-10-08 | Discharge: 2020-10-08 | Disposition: A | Discharge status: Home / Self Care | Payer: Medicare HMO | Source: Ambulatory Visit | Attending: Family Medicine | Admitting: Family Medicine

## 2020-10-08 ENCOUNTER — Ambulatory Visit (INDEPENDENT_AMBULATORY_CARE_PROVIDER_SITE_OTHER): Payer: Medicare HMO | Admitting: Family Medicine

## 2020-10-08 ENCOUNTER — Other Ambulatory Visit: Payer: Self-pay

## 2020-10-08 VITALS — BP 120/81 | Temp 97.3°F | Wt 203.6 lb

## 2020-10-08 DIAGNOSIS — E1169 Type 2 diabetes mellitus with other specified complication: Secondary | ICD-10-CM | POA: Diagnosis not present

## 2020-10-08 DIAGNOSIS — I10 Essential (primary) hypertension: Secondary | ICD-10-CM | POA: Diagnosis not present

## 2020-10-08 DIAGNOSIS — R0600 Dyspnea, unspecified: Secondary | ICD-10-CM

## 2020-10-08 DIAGNOSIS — M79671 Pain in right foot: Secondary | ICD-10-CM

## 2020-10-08 DIAGNOSIS — F321 Major depressive disorder, single episode, moderate: Secondary | ICD-10-CM

## 2020-10-08 DIAGNOSIS — E785 Hyperlipidemia, unspecified: Secondary | ICD-10-CM

## 2020-10-08 DIAGNOSIS — R69 Illness, unspecified: Secondary | ICD-10-CM | POA: Diagnosis not present

## 2020-10-08 DIAGNOSIS — M778 Other enthesopathies, not elsewhere classified: Secondary | ICD-10-CM | POA: Diagnosis not present

## 2020-10-08 MED ORDER — GABAPENTIN 100 MG PO CAPS: 100.0000 mg | ORAL_CAPSULE | Freq: Every day | ORAL | 1 refills | Status: DC

## 2020-10-08 MED ORDER — MELOXICAM 15 MG PO TABS: ORAL_TABLET | ORAL | 1 refills | Status: DC

## 2020-10-08 MED ORDER — LOSARTAN POTASSIUM 25 MG PO TABS: 25.0000 mg | ORAL_TABLET | Freq: Every day | ORAL | 1 refills | Status: DC

## 2020-10-08 MED ORDER — TRIAMTERENE-HCTZ 37.5-25 MG PO TABS: 1.0000 | ORAL_TABLET | Freq: Every day | ORAL | 1 refills | Status: DC

## 2020-10-08 MED ORDER — AMLODIPINE BESYLATE 5 MG PO TABS: 5.0000 mg | ORAL_TABLET | Freq: Every day | ORAL | 1 refills | Status: DC

## 2020-10-08 MED ORDER — FAMOTIDINE 20 MG PO TABS
20.0000 mg | ORAL_TABLET | Freq: Two times a day (BID) | ORAL | 1 refills | Status: DC
Start: 2020-10-08 — End: 2022-01-05

## 2020-10-08 MED ORDER — METOPROLOL SUCCINATE ER 50 MG PO TB24: ORAL_TABLET | ORAL | 1 refills | Status: DC

## 2020-10-08 MED ORDER — FLUOXETINE HCL 20 MG PO TABS: ORAL_TABLET | ORAL | 1 refills | Status: DC

## 2020-10-08 MED ORDER — ROSUVASTATIN CALCIUM 5 MG PO TABS: 5.0000 mg | ORAL_TABLET | Freq: Every day | ORAL | 1 refills | Status: DC

## 2020-10-08 MED ORDER — VICTOZA 18 MG/3ML ~~LOC~~ SOPN: PEN_INJECTOR | SUBCUTANEOUS | 5 refills | Status: DC

## 2020-10-08 NOTE — Progress Notes (Signed)
   Subjective:    Patient ID: [name], female    DOB: March 06, 1965, 55 y.o.   MRN: [id number]  HPI Pt here for follow up on HTN and DM. Pt states she does not have a machine a home to check BP. Pt has been having some light headiness and dizziness. Sugar today 97. Checking sugars once per day.   Left wrist pain that radiates to thumb and pointer finger. Right foot pain and tenderness that has been constant for the past couple of days. Some swelling in right foot.   Pt would also like to discuss her breathing; sometimes feels like she can not get her breath. Pt has to take deep breaths at time.   Patient states she has been under a lot of stress several months ago she was angry with some people and at times felt like she would become out of control.  She denies being in homicidal.  She does relate at times feeling depressed to the point of wanting to die but does not specifically think she is going to hurt herself.  She does states she would benefit from counseling but states she cannot afford it Review of Systems     Objective:   Physical Exam  General-in no acute distress Eyes-no discharge Lungs-respiratory rate normal, CTA CV-no murmurs,RRR Extremities skin warm dry no edema Neuro grossly normal Behavior normal, alert       Assessment & Plan:  1. Type 2 diabetes mellitus with other specified complication, without long-term current use of insulin (HCC) Diabetes under good control continue current measures watch diet  2. Hyperlipidemia associated with type 2 diabetes mellitus (HCC) Cholesterol looks good continue current measures watch diet  3. Dyspnea, unspecified type This intermittent shortness of breath seems to be more associated with stress and not with physical activityNot Moderate activity no chest painhaving any shWithortness of breath of anyAppreciable level  4. Primary hypertension Blood pressure good control  5. Pain in right foot Intermittent pain  right aspect of the foot we will do x-ray to make sure there is not stress fracture more than likely this tendinitis I encourage her to wear a walking boot that she has at home for the next 2 weeks if ongoing troubles referral  6. Depression, major, single episode, moderate (HCC) Moderate depression at times finds her self feeling down at times has suicidal thoughts but does not think she will hurt herself would benefit from counseling but she states she cannot afford it currently.  We will see if social services can look into options for her Possibly there are some low cost options through [facility] health or through other entities to help address her mental health issues and help with counseling  We will recheck her again in 4 weeks

## 2020-10-09 NOTE — Addendum Note (Signed)
Addended by: Marlowe Shores on: 10/09/2020 10:47 AM   Modules accepted: Orders

## 2020-10-09 NOTE — Progress Notes (Signed)
Referral sent to [facility] °

## 2020-10-11 DIAGNOSIS — M1812 Unilateral primary osteoarthritis of first carpometacarpal joint, left hand: Secondary | ICD-10-CM | POA: Diagnosis not present

## 2020-10-15 ENCOUNTER — Other Ambulatory Visit: Payer: Self-pay

## 2020-10-15 ENCOUNTER — Ambulatory Visit (INDEPENDENT_AMBULATORY_CARE_PROVIDER_SITE_OTHER): Payer: Medicare HMO | Admitting: Family Medicine

## 2020-10-15 VITALS — BP 105/70 | HR 72 | Temp 97.2°F | Ht 65.0 in | Wt 199.8 lb

## 2020-10-15 DIAGNOSIS — N3000 Acute cystitis without hematuria: Secondary | ICD-10-CM | POA: Diagnosis not present

## 2020-10-15 DIAGNOSIS — R300 Dysuria: Secondary | ICD-10-CM | POA: Diagnosis not present

## 2020-10-15 DIAGNOSIS — G43019 Migraine without aura, intractable, without status migrainosus: Secondary | ICD-10-CM | POA: Diagnosis not present

## 2020-10-15 LAB — POCT URINALYSIS DIPSTICK
Spec Grav, UA: 1.02 (ref 1.010–1.025)
pH, UA: 6 (ref 5.0–8.0)

## 2020-10-15 MED ORDER — PROCHLORPERAZINE MALEATE 10 MG PO TABS: 10.0000 mg | ORAL_TABLET | Freq: Four times a day (QID) | ORAL | 0 refills | Status: DC | PRN

## 2020-10-15 MED ORDER — CEPHALEXIN 500 MG PO CAPS: 500.0000 mg | ORAL_CAPSULE | Freq: Four times a day (QID) | ORAL | 0 refills | Status: DC

## 2020-10-15 NOTE — Progress Notes (Signed)
   Subjective:    Patient ID: [name], female    DOB: 1965-12-01, 55 y.o.   MRN: [id number]  HPI  Patient arrives with dysuria for 3 days.  Denies high fever chills does relate some urinary pressure denies hematuria denies flank pain  Patient also states she has had a migraine headache for 3 days.  She has chronic migraines several per month sees a neurologist on multiple medicines.  Recently taken ibuprofen does not have anything for nausea.  Review of Systems     Objective:   Physical Exam General-in no acute distress Eyes-no discharge Lungs-respiratory rate normal, CTA CV-no murmurs,RRR Extremities skin warm dry no edema Neuro grossly normal Behavior normal, alert  Patient is able to ambulate without difficulty      Assessment & Plan:  UTI-urinalysis shows WBCs urine culture take an antibiotic prescribed  Migraine headache I am sympathetic to her headache but recommend she stick with the medicine neurology is prescribing Compazine as needed for nausea rest up at home if ongoing troubles connect with neurology  Keep follow-up visit for depression toward the end of this month

## 2020-10-19 LAB — URINE CULTURE

## 2020-10-19 LAB — SPECIMEN STATUS REPORT

## 2020-10-31 ENCOUNTER — Telehealth: Payer: Self-pay | Admitting: Family Medicine

## 2020-10-31 NOTE — Telephone Encounter (Signed)
Left message for patient to call back and schedule Medicare Annual Wellness Visit (AWV) in office.   If unable to come into the office for AWV,  please offer to do virtually or by telephone.  Last AWV: 01/21/2014 per [facility]  Please schedule at anytime with [facility] Health Advisor.  40 minute appointment  Any questions, please contact me at [phone]

## 2020-11-05 ENCOUNTER — Ambulatory Visit (INDEPENDENT_AMBULATORY_CARE_PROVIDER_SITE_OTHER): Payer: Medicare HMO | Admitting: Family Medicine

## 2020-11-05 ENCOUNTER — Other Ambulatory Visit: Payer: Self-pay

## 2020-11-05 ENCOUNTER — Encounter: Payer: Self-pay | Admitting: Family Medicine

## 2020-11-05 VITALS — BP 108/73 | Temp 97.7°F | Ht 65.0 in | Wt 196.0 lb

## 2020-11-05 DIAGNOSIS — N3000 Acute cystitis without hematuria: Secondary | ICD-10-CM | POA: Diagnosis not present

## 2020-11-05 DIAGNOSIS — R809 Proteinuria, unspecified: Secondary | ICD-10-CM | POA: Diagnosis not present

## 2020-11-05 DIAGNOSIS — E785 Hyperlipidemia, unspecified: Secondary | ICD-10-CM

## 2020-11-05 DIAGNOSIS — N289 Disorder of kidney and ureter, unspecified: Secondary | ICD-10-CM | POA: Diagnosis not present

## 2020-11-05 DIAGNOSIS — R350 Frequency of micturition: Secondary | ICD-10-CM

## 2020-11-05 DIAGNOSIS — E1169 Type 2 diabetes mellitus with other specified complication: Secondary | ICD-10-CM | POA: Diagnosis not present

## 2020-11-05 LAB — POCT URINALYSIS DIP (MANUAL ENTRY)
Bilirubin, UA: NEGATIVE
Glucose, UA: NEGATIVE mg/dL
Ketones, POC UA: NEGATIVE mg/dL
Nitrite, UA: NEGATIVE
Protein Ur, POC: 30 mg/dL — AB
Spec Grav, UA: 1.01 (ref 1.010–1.025)
Urobilinogen, UA: 0.2 E.U./dL
pH, UA: 7 (ref 5.0–8.0)

## 2020-11-05 MED ORDER — CIPROFLOXACIN HCL 250 MG PO TABS: ORAL_TABLET | ORAL | 0 refills | Status: DC

## 2020-11-05 MED ORDER — SERTRALINE HCL 100 MG PO TABS: 100.0000 mg | ORAL_TABLET | Freq: Every day | ORAL | 2 refills | Status: DC

## 2020-11-05 NOTE — Progress Notes (Signed)
   Subjective:    Patient ID: [name], female    DOB: [birth year], 55 y.o.   MRN: [id number]  HPI Patient feels depression getting somewhat better but would like to try different antidepressant because she does not feel the current one is doing enough in addition to this she states her situation at home doing better if she has a better relationship with her grandson and granddaughter Urinary frequency - Plan: POCT urinalysis dipstick, Urine Culture, Hemoglobin A1c, Urine Microalbumin w/creat. ratio, Basic metabolic panel  Proteinuria, unspecified type - Plan: Urine Culture, Hemoglobin A1c, Urine Microalbumin w/creat. ratio, Basic metabolic panel  Hyperlipidemia associated with type 2 diabetes mellitus ([place]) - Plan: Urine Culture, Hemoglobin A1c, Urine Microalbumin w/creat. ratio, Basic metabolic panel  Renal insufficiency - Plan: Urine Culture, Hemoglobin A1c, Urine Microalbumin w/creat. ratio, Basic metabolic panel    Review of Systems     Objective:   Physical Exam Lungs clear heart regular pulse normal BP good extremities no edema       Assessment & Plan:  1. Urinary frequency Antibiotics 5 days as directed culture taken - POCT urinalysis dipstick - Urine Culture - Hemoglobin A1c - Urine Microalbumin w/creat. ratio - Basic metabolic panel  2. Proteinuria, unspecified type Recheck A1c and urine micro protein with met 7 in approximately 8 to 12 weeks with follow-up office visit healthy diet regular activity - Urine Culture - Hemoglobin A1c - Urine Microalbumin w/creat. ratio - Basic metabolic panel  3. Hyperlipidemia associated with type 2 diabetes mellitus (HCC) Watch starches in diet healthy diet do labs before follow-up consider Farxiga depending on the test results - Urine Culture - Hemoglobin A1c - Urine Microalbumin w/creat. ratio - Basic metabolic panel  4. Renal insufficiency Await test results within 6 to 8 weeks - Urine Culture - Hemoglobin  A1c - Urine Microalbumin w/creat. ratio - Basic metabolic panel  5. Acute cystitis without hematuria Culture, antibiotic  Antidepressant switch to sertraline follow-up in 4 to 6 weeks patient denies being suicidal or homicidal

## 2020-11-05 NOTE — Patient Instructions (Addendum)
Try to minimize meloxicam use  Stop prozac Will start 100 mg zoloft  Continue losartan Eat healthy  DASH Eating Plan DASH stands for Dietary Approaches to Stop Hypertension. The DASH eating plan is a healthy eating plan that has been shown to: Reduce high blood pressure (hypertension). Reduce your risk for type 2 diabetes, heart disease, and stroke. Help with weight loss. What are tips for following this plan? Reading food labels Check food labels for the amount of salt (sodium) per serving. Choose foods with less than 5 percent of the Daily Value of sodium. Generally, foods with less than 300 milligrams (mg) of sodium per serving fit into this eating plan. To find whole grains, look for the word "whole" as the first word in the ingredient list. Shopping Buy products labeled as "low-sodium" or "no salt added." Buy fresh foods. Avoid canned foods and pre-made or frozen meals. Cooking Avoid adding salt when cooking. Use salt-free seasonings or herbs instead of table salt or sea salt. Check with your health care provider or pharmacist before using salt substitutes. Do not fry foods. Cook foods using healthy methods such as baking, boiling, grilling, roasting, and broiling instead. Cook with heart-healthy oils, such as olive, canola, avocado, soybean, or sunflower oil. Meal planning  Eat a balanced diet that includes: 4 or more servings of fruits and 4 or more servings of vegetables each day. Try to fill one-half of your plate with fruits and vegetables. 6-8 servings of whole grains each day. Less than 6 oz (170 g) of lean meat, poultry, or fish each day. A 3-oz (85-g) serving of meat is about the same size as a deck of cards. One egg equals 1 oz (28 g). 2-3 servings of low-fat dairy each day. One serving is 1 cup (237 mL). 1 serving of nuts, seeds, or beans 5 times each week. 2-3 servings of heart-healthy fats. Healthy fats called omega-3 fatty acids are found in foods such as walnuts,  flaxseeds, fortified milks, and eggs. These fats are also found in cold-water fish, such as sardines, salmon, and mackerel. Limit how much you eat of: Canned or prepackaged foods. Food that is high in trans fat, such as some fried foods. Food that is high in saturated fat, such as fatty meat. Desserts and other sweets, sugary drinks, and other foods with added sugar. Full-fat dairy products. Do not salt foods before eating. Do not eat more than 4 egg yolks a week. Try to eat at least 2 vegetarian meals a week. Eat more home-cooked food and less restaurant, buffet, and fast food. Lifestyle When eating at a restaurant, ask that your food be prepared with less salt or no salt, if possible. If you drink alcohol: Limit how much you use to: 0-1 drink a day for women who are not pregnant. 0-2 drinks a day for men. Be aware of how much alcohol is in your drink. In the [place] one drink equals one 12 oz bottle of beer (355 mL), one 5 oz glass of wine (148 mL), or one 1 oz glass of hard liquor (44 mL). General information Avoid eating more than 2,300 mg of salt a day. If you have hypertension, you may need to reduce your sodium intake to 1,500 mg a day. Work with your health care provider to maintain a healthy body weight or to lose weight. Ask what an ideal weight is for you. Get at least 30 minutes of exercise that causes your heart to beat faster (aerobic exercise) most days  of the week. Activities may include walking, swimming, or biking. Work with your health care provider or dietitian to adjust your eating plan to your individual calorie needs. What foods should I eat? Fruits All fresh, dried, or frozen fruit. Canned fruit in natural juice (without added sugar). Vegetables Fresh or frozen vegetables (raw, steamed, roasted, or grilled). Low-sodium or reduced-sodium tomato and vegetable juice. Low-sodium or reduced-sodium tomato sauce and tomato paste. Low-sodium or reduced-sodium canned  vegetables. Grains Whole-grain or whole-wheat bread. Whole-grain or whole-wheat pasta. Brown rice. [place]. Bulgur. Whole-grain and low-sodium cereals. Pita bread. Low-fat, low-sodium crackers. Whole-wheat flour tortillas. Meats and other proteins Skinless chicken or [place]. Ground chicken or [place]. Pork with fat trimmed off. Fish and seafood. Egg whites. Dried beans, peas, or lentils. Unsalted nuts, nut butters, and seeds. Unsalted canned beans. Lean cuts of beef with fat trimmed off. Low-sodium, lean precooked or cured meat, such as sausages or meat loaves. Dairy Low-fat (1%) or fat-free (skim) milk. Reduced-fat, low-fat, or fat-free cheeses. Nonfat, low-sodium ricotta or cottage cheese. Low-fat or nonfat yogurt. Low-fat, low-sodium cheese. Fats and oils Soft margarine without trans fats. Vegetable oil. Reduced-fat, low-fat, or light mayonnaise and salad dressings (reduced-sodium). Canola, safflower, olive, avocado, soybean, and sunflower oils. Avocado. Seasonings and condiments Herbs. Spices. Seasoning mixes without salt. Other foods Unsalted popcorn and pretzels. Fat-free sweets. The items listed above may not be a complete list of foods and beverages you can eat. Contact a dietitian for more information. What foods should I avoid? Fruits Canned fruit in a light or heavy syrup. Fried fruit. Fruit in cream or butter sauce. Vegetables Creamed or fried vegetables. Vegetables in a cheese sauce. Regular canned vegetables (not low-sodium or reduced-sodium). Regular canned tomato sauce and paste (not low-sodium or reduced-sodium). Regular tomato and vegetable juice (not low-sodium or reduced-sodium). Angie Fava. Olives. Grains Baked goods made with fat, such as croissants, muffins, or some breads. Dry pasta or rice meal packs. Meats and other proteins Fatty cuts of meat. Ribs. Fried meat. Berniece Salines. Bologna, salami, and other precooked or cured meats, such as sausages or meat loaves. Fat from  the back of a pig (fatback). Bratwurst. Salted nuts and seeds. Canned beans with added salt. Canned or smoked fish. Whole eggs or egg yolks. Chicken or [place] with skin. Dairy Whole or 2% milk, cream, and half-and-half. Whole or full-fat cream cheese. Whole-fat or sweetened yogurt. Full-fat cheese. Nondairy creamers. Whipped toppings. Processed cheese and cheese spreads. Fats and oils Butter. Stick margarine. Lard. Shortening. Ghee. Bacon fat. Tropical oils, such as coconut, palm kernel, or palm oil. Seasonings and condiments Onion salt, garlic salt, seasoned salt, table salt, and sea salt. Worcestershire sauce. Tartar sauce. Barbecue sauce. Teriyaki sauce. Soy sauce, including reduced-sodium. Steak sauce. Canned and packaged gravies. Fish sauce. Oyster sauce. Cocktail sauce. Store-bought horseradish. Ketchup. Mustard. Meat flavorings and tenderizers. Bouillon cubes. Hot sauces. Pre-made or packaged marinades. Pre-made or packaged taco seasonings. Relishes. Regular salad dressings. Other foods Salted popcorn and pretzels. The items listed above may not be a complete list of foods and beverages you should avoid. Contact a dietitian for more information. Where to find more information National Heart, Lung, and Blood Institute: https://wilson-eaton.com/ American Heart Association: www.heart.org Academy of Nutrition and Dietetics: www.eatright.[place]: www.kidney.org Summary The DASH eating plan is a healthy eating plan that has been shown to reduce high blood pressure (hypertension). It may also reduce your risk for type 2 diabetes, heart disease, and stroke. When on the DASH eating plan, aim  to eat more fresh fruits and vegetables, whole grains, lean proteins, low-fat dairy, and heart-healthy fats. With the DASH eating plan, you should limit salt (sodium) intake to 2,300 mg a day. If you have hypertension, you may need to reduce your sodium intake to 1,500 mg a day. Work with your  health care provider or dietitian to adjust your eating plan to your individual calorie needs. This information is not intended to replace advice given to you by your health care provider. Make sure you discuss any questions you have with your health care provider. Document Revised: 12/30/2018 Document Reviewed: 12/30/2018 Elsevier Patient Education  2022 [place].   Please follow-up in 4 to 6 weeks  In early December we will do A1c, met 7, urine ACR

## 2020-11-05 NOTE — Progress Notes (Signed)
   Subjective:    Patient ID: [name], female    DOB: 06-20-1965, 55 y.o.   MRN: [id number]  Diabetes     Review of Systems     Objective:   Physical Exam        Assessment & Plan:

## 2020-11-07 ENCOUNTER — Other Ambulatory Visit: Payer: Self-pay

## 2020-11-07 MED ORDER — SERTRALINE HCL 100 MG PO TABS: 100.0000 mg | ORAL_TABLET | Freq: Every day | ORAL | 0 refills | Status: DC

## 2020-11-11 LAB — URINE CULTURE

## 2020-11-11 LAB — SPECIMEN STATUS REPORT

## 2020-12-02 ENCOUNTER — Ambulatory Visit: Payer: Self-pay | Admitting: Family Medicine

## 2020-12-03 ENCOUNTER — Encounter: Payer: Self-pay | Admitting: Family Medicine

## 2020-12-03 ENCOUNTER — Ambulatory Visit (INDEPENDENT_AMBULATORY_CARE_PROVIDER_SITE_OTHER): Payer: Medicare HMO | Admitting: Family Medicine

## 2020-12-03 ENCOUNTER — Other Ambulatory Visit: Payer: Self-pay

## 2020-12-03 VITALS — BP 124/76 | Temp 97.4°F | Wt 197.2 lb

## 2020-12-03 DIAGNOSIS — N289 Disorder of kidney and ureter, unspecified: Secondary | ICD-10-CM

## 2020-12-03 DIAGNOSIS — Z23 Encounter for immunization: Secondary | ICD-10-CM | POA: Diagnosis not present

## 2020-12-03 NOTE — Progress Notes (Signed)
   Subjective:    Patient ID: [name], female    DOB: 11-15-1965, 55 y.o.   MRN: [id number]  HPI Pt here for follow up. Pt was placed on Zoloft 100 mg daily. Pt states she can tell it is helping, not as depressed as before. No side effects from med.    Pt would like to talk with provider regarding flu shot. Last time she received flu shot she was sick for 2 months.  We did discuss her blood pressure cholesterol diabetes the importance of taking her medicines she will check lab work before her next follow-up visit within 3 months  Review of Systems     Objective:   Physical Exam  Lungs clear heart regular osteoarthritis noted in both knees      Assessment & Plan:  We had a good discussion today-I believe it is in her best interest to do a flu shot it is unlikely to make her sick for 2 months with this shot but I told her we could not guarantee anything after further discussion she decided to get the flu shot  She has bilateral osteoarthritis on the right knee into the medial aspect of the knee causing her pain on the left knee the lateral aspect.  Because of kidneys I do not recommend anti-inflammatories instead we will go with Voltaren gel as needed basis along with regular use of Tylenol when needed  Her depression is doing well on the Zoloft but she states she is only taking it 4 days/week after further discussion she states she will utilize half tablet daily  Cholesterol under good control with medication continue current medicine  Blood pressure good control  Diabetes improved control

## 2020-12-04 ENCOUNTER — Other Ambulatory Visit: Payer: Self-pay

## 2020-12-04 MED ORDER — SERTRALINE HCL 100 MG PO TABS: ORAL_TABLET | ORAL | 0 refills | Status: DC

## 2020-12-10 ENCOUNTER — Ambulatory Visit: Payer: Medicare HMO | Admitting: Neurology

## 2020-12-25 ENCOUNTER — Ambulatory Visit: Payer: Medicare HMO | Admitting: Family Medicine

## 2020-12-31 ENCOUNTER — Other Ambulatory Visit: Payer: Self-pay | Admitting: Family Medicine

## 2020-12-31 DIAGNOSIS — Z1231 Encounter for screening mammogram for malignant neoplasm of breast: Secondary | ICD-10-CM

## 2021-01-17 DIAGNOSIS — E1169 Type 2 diabetes mellitus with other specified complication: Secondary | ICD-10-CM | POA: Diagnosis not present

## 2021-01-17 DIAGNOSIS — E785 Hyperlipidemia, unspecified: Secondary | ICD-10-CM | POA: Diagnosis not present

## 2021-01-17 DIAGNOSIS — R350 Frequency of micturition: Secondary | ICD-10-CM | POA: Diagnosis not present

## 2021-01-17 DIAGNOSIS — R809 Proteinuria, unspecified: Secondary | ICD-10-CM | POA: Diagnosis not present

## 2021-01-17 DIAGNOSIS — N289 Disorder of kidney and ureter, unspecified: Secondary | ICD-10-CM | POA: Diagnosis not present

## 2021-01-18 LAB — HEMOGLOBIN A1C
Est. average glucose Bld gHb Est-mCnc: 131 mg/dL
Hgb A1c MFr Bld: 6.2 % — ABNORMAL HIGH (ref 4.8–5.6)

## 2021-01-18 LAB — BASIC METABOLIC PANEL
BUN/Creatinine Ratio: 13 (ref 9–23)
BUN: 13 mg/dL (ref 6–24)
CO2: 26 mmol/L (ref 20–29)
Calcium: 10.4 mg/dL — ABNORMAL HIGH (ref 8.7–10.2)
Chloride: 97 mmol/L (ref 96–106)
Creatinine, Ser: 0.98 mg/dL (ref 0.57–1.00)
Glucose: 113 mg/dL — ABNORMAL HIGH (ref 70–99)
Potassium: 4.5 mmol/L (ref 3.5–5.2)
Sodium: 139 mmol/L (ref 134–144)
eGFR: 67 mL/min/{1.73_m2} (ref >59)

## 2021-01-18 LAB — MICROALBUMIN / CREATININE URINE RATIO
Creatinine, Urine: 87.8 mg/dL
Microalb/Creat Ratio: 16 mg/g creat (ref 0–29)
Microalbumin, Urine: 13.7 ug/mL

## 2021-01-20 ENCOUNTER — Other Ambulatory Visit: Payer: Self-pay | Admitting: Family Medicine

## 2021-01-21 ENCOUNTER — Ambulatory Visit: Payer: Medicare HMO | Admitting: Family Medicine

## 2021-01-30 ENCOUNTER — Other Ambulatory Visit: Payer: Self-pay

## 2021-01-30 ENCOUNTER — Ambulatory Visit
Admission: RE | Admit: 2021-01-30 | Discharge: 2021-01-30 | Disposition: A | Discharge status: Home / Self Care | Payer: Medicare HMO | Source: Ambulatory Visit | Attending: Family Medicine | Admitting: Family Medicine

## 2021-01-30 DIAGNOSIS — Z1231 Encounter for screening mammogram for malignant neoplasm of breast: Secondary | ICD-10-CM

## 2021-02-12 ENCOUNTER — Ambulatory Visit: Payer: Medicare HMO | Admitting: Family Medicine

## 2021-02-12 ENCOUNTER — Other Ambulatory Visit: Payer: Self-pay | Admitting: Family Medicine

## 2021-02-12 DIAGNOSIS — N6310 Unspecified lump in the right breast, unspecified quadrant: Secondary | ICD-10-CM

## 2021-03-04 DIAGNOSIS — M170 Bilateral primary osteoarthritis of knee: Secondary | ICD-10-CM | POA: Diagnosis not present

## 2021-03-10 ENCOUNTER — Telehealth: Payer: Self-pay | Admitting: *Deleted

## 2021-03-10 ENCOUNTER — Ambulatory Visit (INDEPENDENT_AMBULATORY_CARE_PROVIDER_SITE_OTHER): Payer: Medicare HMO | Admitting: Family Medicine

## 2021-03-10 ENCOUNTER — Other Ambulatory Visit: Payer: Self-pay

## 2021-03-10 ENCOUNTER — Encounter: Payer: Self-pay | Admitting: Family Medicine

## 2021-03-10 VITALS — BP 120/72 | HR 56 | Temp 97.0°F | Wt 195.6 lb

## 2021-03-10 DIAGNOSIS — G43019 Migraine without aura, intractable, without status migrainosus: Secondary | ICD-10-CM | POA: Diagnosis not present

## 2021-03-10 DIAGNOSIS — M47816 Spondylosis without myelopathy or radiculopathy, lumbar region: Secondary | ICD-10-CM

## 2021-03-10 DIAGNOSIS — E1169 Type 2 diabetes mellitus with other specified complication: Secondary | ICD-10-CM

## 2021-03-10 DIAGNOSIS — I8393 Asymptomatic varicose veins of bilateral lower extremities: Secondary | ICD-10-CM | POA: Diagnosis not present

## 2021-03-10 DIAGNOSIS — E785 Hyperlipidemia, unspecified: Secondary | ICD-10-CM

## 2021-03-10 MED ORDER — SERTRALINE HCL 100 MG PO TABS: ORAL_TABLET | ORAL | 1 refills | Status: DC

## 2021-03-10 NOTE — Telephone Encounter (Signed)
Please write out a prescription for knee-high surgical support hose lower/standard pressure would be fine I will sign it

## 2021-03-10 NOTE — Telephone Encounter (Signed)
Prescription faxed to [facility] 

## 2021-03-10 NOTE — Progress Notes (Signed)
° °  Subjective:    Patient ID: [name], female    DOB: 1965/08/03, 56 y.o.   MRN: [id number]  Diabetes She presents for her follow-up diabetic visit. She has type 2 diabetes mellitus. There are no hypoglycemic associated symptoms. Associated symptoms include blurred vision. There are no hypoglycemic complications. There are no diabetic complications. (BLOOD SUGAR THIS MORNING 130) She does not see a podiatrist.Eye exam is not current (has one upcoming in March).   Pt having bulging veins in both legs, we did discuss varicose veins she does have some mild varicose veins in her calves none are severe appearing but she states they do bother her   left side back pain that has not been improving and going on since December.  She relates left lower back pain and discomfort radiates to the buttock but not down into the legs she denies any major setbacks with it  She does have underlying migraine issues she takes a beta-blocker for this  She also has reflux related issues and takes famotidine for this but states that the possibly from both to been is gone up and she does not think she needs it any longer  Review of Systems  Eyes:  Positive for blurred vision.      Objective:   Physical Exam  General-in no acute distress Eyes-no discharge Lungs-respiratory rate normal, CTA CV-no murmurs,RRR Extremities skin warm dry no edema Neuro grossly normal Behavior normal, alert       Assessment & Plan:  1. Intractable migraine without aura and without status migrainosus Continue the beta-blocker.  She does not want any changes to this currently  2. Hyperlipidemia associated with type 2 diabetes mellitus (HCC) Continue statin.  She is doing well with this.  In addition to discontinue her GLP-1.  She also takes low-dose losartan to help with blood pressure but also help with preventing chronic kidney disease issues  3. Lumbar spondylosis Low back stretches exercises  discuss.  Depression could be doing better.  We did discuss bumping up the dose of Zoloft.  Currently she is taking 100 mg a day  Stop famotidine currently.  Patient feels reflux under good control  Recent lab work looks good A1c under good control  Compression knee-high surgical hose for varicose veins information given if ongoing troubles or problems referral to vascular  Follow-up in 4 months

## 2021-03-10 NOTE — Telephone Encounter (Signed)
Ready, on the door

## 2021-03-10 NOTE — Telephone Encounter (Signed)
Patient states she forgot to get her script for compression stocking before she left this am and would like the script faxed to [facility]

## 2021-03-14 ENCOUNTER — Ambulatory Visit
Admission: RE | Admit: 2021-03-14 | Discharge: 2021-03-14 | Disposition: A | Discharge status: Home / Self Care | Payer: Medicare HMO | Source: Ambulatory Visit | Attending: Family Medicine | Admitting: Family Medicine

## 2021-03-14 ENCOUNTER — Other Ambulatory Visit: Payer: Self-pay | Admitting: Family Medicine

## 2021-03-14 ENCOUNTER — Other Ambulatory Visit: Payer: Self-pay

## 2021-03-14 DIAGNOSIS — R922 Inconclusive mammogram: Secondary | ICD-10-CM | POA: Diagnosis not present

## 2021-03-14 DIAGNOSIS — N6310 Unspecified lump in the right breast, unspecified quadrant: Secondary | ICD-10-CM

## 2021-03-14 DIAGNOSIS — N644 Mastodynia: Secondary | ICD-10-CM | POA: Diagnosis not present

## 2021-03-14 DIAGNOSIS — N6320 Unspecified lump in the left breast, unspecified quadrant: Secondary | ICD-10-CM

## 2021-03-21 ENCOUNTER — Other Ambulatory Visit: Payer: Medicare HMO

## 2021-04-29 DIAGNOSIS — H524 Presbyopia: Secondary | ICD-10-CM | POA: Diagnosis not present

## 2021-04-29 DIAGNOSIS — E109 Type 1 diabetes mellitus without complications: Secondary | ICD-10-CM | POA: Diagnosis not present

## 2021-05-01 DIAGNOSIS — R109 Unspecified abdominal pain: Secondary | ICD-10-CM | POA: Diagnosis not present

## 2021-05-06 ENCOUNTER — Other Ambulatory Visit: Payer: Self-pay | Admitting: Family Medicine

## 2021-05-06 ENCOUNTER — Ambulatory Visit (HOSPITAL_BASED_OUTPATIENT_CLINIC_OR_DEPARTMENT_OTHER): Payer: Medicare HMO

## 2021-05-06 ENCOUNTER — Ambulatory Visit (INDEPENDENT_AMBULATORY_CARE_PROVIDER_SITE_OTHER): Payer: Medicare HMO | Admitting: Nurse Practitioner

## 2021-05-06 ENCOUNTER — Other Ambulatory Visit: Payer: Self-pay

## 2021-05-06 ENCOUNTER — Ambulatory Visit (HOSPITAL_COMMUNITY)
Admission: RE | Admit: 2021-05-06 | Discharge: 2021-05-06 | Disposition: A | Discharge status: Home / Self Care | Payer: Medicare HMO | Source: Ambulatory Visit | Attending: Nurse Practitioner | Admitting: Nurse Practitioner

## 2021-05-06 ENCOUNTER — Other Ambulatory Visit (HOSPITAL_COMMUNITY)
Admission: RE | Admit: 2021-05-06 | Discharge: 2021-05-06 | Disposition: A | Discharge status: Home / Self Care | Payer: Medicare HMO | Source: Ambulatory Visit | Attending: Nurse Practitioner | Admitting: Nurse Practitioner

## 2021-05-06 ENCOUNTER — Encounter: Payer: Self-pay | Admitting: Nurse Practitioner

## 2021-05-06 VITALS — BP 118/72 | HR 86 | Temp 96.7°F | Ht 65.0 in | Wt 200.0 lb

## 2021-05-06 DIAGNOSIS — R109 Unspecified abdominal pain: Secondary | ICD-10-CM

## 2021-05-06 DIAGNOSIS — E1169 Type 2 diabetes mellitus with other specified complication: Secondary | ICD-10-CM | POA: Diagnosis not present

## 2021-05-06 DIAGNOSIS — M48061 Spinal stenosis, lumbar region without neurogenic claudication: Secondary | ICD-10-CM | POA: Diagnosis not present

## 2021-05-06 DIAGNOSIS — R1032 Left lower quadrant pain: Secondary | ICD-10-CM

## 2021-05-06 DIAGNOSIS — E785 Hyperlipidemia, unspecified: Secondary | ICD-10-CM

## 2021-05-06 DIAGNOSIS — M4319 Spondylolisthesis, multiple sites in spine: Secondary | ICD-10-CM | POA: Diagnosis not present

## 2021-05-06 DIAGNOSIS — M47816 Spondylosis without myelopathy or radiculopathy, lumbar region: Secondary | ICD-10-CM | POA: Diagnosis not present

## 2021-05-06 DIAGNOSIS — K429 Umbilical hernia without obstruction or gangrene: Secondary | ICD-10-CM | POA: Diagnosis not present

## 2021-05-06 LAB — COMPREHENSIVE METABOLIC PANEL
ALT: 18 U/L (ref 0–44)
AST: 19 U/L (ref 15–41)
Albumin: 4 g/dL (ref 3.5–5.0)
Alkaline Phosphatase: 51 U/L (ref 38–126)
Anion gap: 6 (ref 5–15)
BUN: 21 mg/dL — ABNORMAL HIGH (ref 6–20)
CO2: 29 mmol/L (ref 22–32)
Calcium: 9 mg/dL (ref 8.9–10.3)
Chloride: 102 mmol/L (ref 98–111)
Creatinine, Ser: 0.7 mg/dL (ref 0.44–1.00)
GFR, Estimated: >60 mL/min (ref >60)
Glucose, Bld: 121 mg/dL — ABNORMAL HIGH (ref 70–99)
Potassium: 3.8 mmol/L (ref 3.5–5.1)
Sodium: 137 mmol/L (ref 135–145)
Total Bilirubin: 0.3 mg/dL (ref 0.3–1.2)
Total Protein: 7.6 g/dL (ref 6.5–8.1)

## 2021-05-06 LAB — POCT URINALYSIS DIP (MANUAL ENTRY)
Bilirubin, UA: NEGATIVE
Blood, UA: NEGATIVE
Glucose, UA: NEGATIVE mg/dL
Ketones, POC UA: NEGATIVE mg/dL
Leukocytes, UA: NEGATIVE
Nitrite, UA: NEGATIVE
Spec Grav, UA: 1.02 (ref 1.010–1.025)
Urobilinogen, UA: 0.2 E.U./dL
pH, UA: 5 (ref 5.0–8.0)

## 2021-05-06 MED ORDER — IBUPROFEN 600 MG PO TABS: 600.0000 mg | ORAL_TABLET | Freq: Three times a day (TID) | ORAL | 0 refills | Status: DC | PRN

## 2021-05-06 NOTE — Progress Notes (Signed)
? ?Subjective:  ? ? Patient ID: [name], female    DOB: 04-12-1965, 56 y.o.   MRN: [id number] ? ?HPI ? ?56 year old female with history of diabetes, hypertension, hyperlipidemia, arthritis presents to clinic for pain to her left flank x 1.5 week.  Patient states that pain is a 10 out of 10 and hurts constantly despite movement or position.  Patient describes pain as a sharp stabbing constant pain to her left lower back.  Patient also admits to urinary frequency however denies any dysuria or urgency.  Patient also denies nausea, fever, chills, vomiting, blood in urine.  Patient has been taking over-the-counter Tylenol and naproxen which have not been helpful. ? ? ?Review of Systems  ?Genitourinary:  Positive for flank pain.  ?     Urinary frequency  ?All other systems reviewed and are negative. ? ?   ?Objective:  ? Physical Exam ?Constitutional:   ?   General: She is not in acute distress. ?   Appearance: Normal appearance. She is obese. She is not ill-appearing, toxic-appearing or diaphoretic.  ?HENT:  ?   Head: Normocephalic.  ?Cardiovascular:  ?   Rate and Rhythm: Normal rate and regular rhythm.  ?   Pulses: Normal pulses.  ?   Heart sounds: Normal heart sounds. No murmur heard. ?Pulmonary:  ?   Effort: Pulmonary effort is normal. No respiratory distress.  ?   Breath sounds: Normal breath sounds. No wheezing.  ?Abdominal:  ?   General: Abdomen is flat. Bowel sounds are normal.  ?   Palpations: Abdomen is soft. There is no shifting dullness, fluid wave, hepatomegaly, splenomegaly, mass or pulsatile mass.  ?   Tenderness: There is abdominal tenderness in the left lower quadrant. There is left CVA tenderness. There is no right CVA tenderness, guarding or rebound. Negative signs include Murphy's sign and Rovsing's sign.  ?   Hernia: No hernia is present.  ?Musculoskeletal:  ?   Cervical back: Normal, normal range of motion and neck supple.  ?   Thoracic back: Normal.  ?   Lumbar back: Tenderness present. No  swelling, edema, deformity, signs of trauma, lacerations, spasms or bony tenderness. Normal range of motion. No scoliosis.  ?   Comments: Grossly intact  ?Skin: ?   General: Skin is warm.  ?   Capillary Refill: Capillary refill takes less than 2 seconds.  ?Neurological:  ?   Mental Status: She is alert.  ?   Comments: Grossly intact  ?Psychiatric:     ?   Mood and Affect: Mood normal.     ?   Behavior: Behavior normal.  ? ? ? ? ? ?   ?Assessment & Plan:  ? ?1. Flank pain ?-Possible kidney stone. ?-Point-of-care testing urinalysis was negative.  However will send for a urine culture to rule out infectious causes. ?-We will also get CBC to look for leukocytosis which may indicate renal abscess or other infectious causes. ?-We will also assess CMP with GFR to evaluate renal function. ?-Patient may use ibuprofen for pain management.  Patient notified to not use naproxen with ibuprofen. ?-We will get KUB first to evaluate presence of kidney stone.  However if KUB negative will order CT of the kidney. ?- POCT urinalysis dipstick ?- CBC with Differential ?- ibuprofen (ADVIL) 600 MG tablet; Take 1 tablet (600 mg total) by mouth every 8 (eight) hours as needed.  Dispense: 30 tablet; Refill: 0 ?- COMPLETE METABOLIC PANEL WITH GFR ?- Urine Culture ?- DG Abd  1 View ?- CT RENAL STONE STUDY ?-We will notify patient of results and provide instructions for follow-up care. ? ? ?3. Hyperlipidemia associated with type 2 diabetes mellitus (HCC) ?- Lipid panel ? ?4. Type 2 diabetes mellitus with other specified complication, without long-term current use of insulin (HCC) ?- HgB A1c ? ?  ?Note:  This document was prepared using Dragon voice recognition software and may include unintentional dictation errors. ? ? ?

## 2021-05-08 ENCOUNTER — Other Ambulatory Visit: Payer: Self-pay | Admitting: Nurse Practitioner

## 2021-05-08 MED ORDER — CYCLOBENZAPRINE HCL 5 MG PO TABS: 5.0000 mg | ORAL_TABLET | Freq: Three times a day (TID) | ORAL | 1 refills | Status: DC | PRN

## 2021-05-09 DIAGNOSIS — E785 Hyperlipidemia, unspecified: Secondary | ICD-10-CM | POA: Diagnosis not present

## 2021-05-09 DIAGNOSIS — E1169 Type 2 diabetes mellitus with other specified complication: Secondary | ICD-10-CM | POA: Diagnosis not present

## 2021-05-09 LAB — URINE CULTURE: Organism ID, Bacteria: NO GROWTH

## 2021-05-10 LAB — LIPID PANEL
Chol/HDL Ratio: 3.4 ratio (ref 0.0–4.4)
Cholesterol, Total: 155 mg/dL (ref 100–199)
HDL: 45 mg/dL (ref >39)
LDL Chol Calc (NIH): 96 mg/dL (ref 0–99)
Triglycerides: 72 mg/dL (ref 0–149)
VLDL Cholesterol Cal: 14 mg/dL (ref 5–40)

## 2021-05-10 LAB — HEMOGLOBIN A1C
Est. average glucose Bld gHb Est-mCnc: 143 mg/dL
Hgb A1c MFr Bld: 6.6 % — ABNORMAL HIGH (ref 4.8–5.6)

## 2021-05-13 ENCOUNTER — Ambulatory Visit (INDEPENDENT_AMBULATORY_CARE_PROVIDER_SITE_OTHER): Payer: Medicare HMO

## 2021-05-13 VITALS — Ht 65.0 in | Wt 200.4 lb

## 2021-05-13 DIAGNOSIS — Z0000 Encounter for general adult medical examination without abnormal findings: Secondary | ICD-10-CM | POA: Diagnosis not present

## 2021-05-13 NOTE — Progress Notes (Signed)
? ?Subjective:  ? [name] is a 56 y.o. female who presents for Medicare Annual (Subsequent) preventive examination. ? ?Review of Systems    ? ?Cardiac Risk Factors include: diabetes mellitus;hypertension;dyslipidemia;sedentary lifestyle;obesity (BMI >30kg/m2) ? ?   ?Objective:  ?  ?Today's Vitals  ? 05/13/21 7793 05/13/21 0901  ?Weight: 200 lb 6.4 oz (90.9 kg)   ?Height: _0  (1.651 m)   ?PainSc:  5   ? ?Body mass index is 33.35 kg/m?. ? ? ?  05/13/2021  ?  9:18 AM 06/11/2020  ?  1:58 PM 05/24/2020  ?  2:40 PM 04/16/2018  ? 12:23 PM 12/25/2017  ?  2:18 PM 02/21/2016  ?  2:00 PM 08/22/2015  ?  9:50 PM  ?Advanced Directives  ?Does Patient Have a Medical Advance Directive? _1  Yes No  ?Type of Advance Directive      Living will   ?Does patient want to make changes to medical advance directive?      No - Patient declined   ?Would patient like information on creating a medical advance directive? No - Patient declined No - Patient declined No - Patient declined No - Patient declined     ? ? ?Current Medications (verified) ?Outpatient Encounter Medications as of 05/13/2021  ?Medication Sig  ? cyclobenzaprine (FLEXERIL) 5 MG tablet Take 1 tablet (5 mg total) by mouth 3 (three) times daily as needed for muscle spasms.  ? albuterol (VENTOLIN HFA) 108 (90 Base) MCG/ACT inhaler Inhale 2 puffs into the lungs every 4 (four) hours as needed.  ? amLODipine (NORVASC) 5 MG tablet Take 1 tablet (5 mg total) by mouth daily.  ? Ascorbic Acid (VITAMIN C PO) Take 250 mg by mouth.  ? blood glucose meter kit and supplies Dispense based on patient and insurance preference. Use to test sugar once a day. For dx E11.9  ? diclofenac Sodium (VOLTAREN) 1 % GEL SMARTSIG:2-4 Gram(s) Topical 4 Times Daily PRN  ? famotidine (PEPCID) 20 MG tablet Take 1 tablet (20 mg total) by mouth 2 (two) times daily.  ? fluticasone (FLONASE) 50 MCG/ACT nasal spray Place 2 sprays into both nostrils daily as needed for allergies or rhinitis.  ?  gabapentin (NEURONTIN) 100 MG capsule Take 1 capsule (100 mg total) by mouth daily.  ? ibuprofen (ADVIL) 600 MG tablet Take 1 tablet (600 mg total) by mouth every 8 (eight) hours as needed.  ? Lancets (ONETOUCH DELICA PLUS [id number]) MISC USE 1 LANCET TO CHECK GLUCOSE ONCE DAILY  ? liraglutide (VICTOZA) 18 MG/3ML SOPN INJECT 0.3MLS (1.8MG TOTAL) INTO THE SKIN DAILY  ? losartan (COZAAR) 25 MG tablet Take 1 tablet by mouth once daily  ? metoprolol succinate (TOPROL-XL) 50 MG 24 hr tablet Take 1 tablet by mouth once daily  ? metoprolol tartrate (LOPRESSOR) 50 MG tablet Take 1 tablet by mouth twice daily  ? Multiple Vitamins-Minerals (MULTIPLE VITAMINS/WOMENS PO) Take by mouth.  ? mupirocin ointment (BACTROBAN) 2 % Apply thin amount to rash bid prn  ? naproxen (NAPROSYN) 500 MG tablet Take 500 mg by mouth 2 (two) times daily as needed.  ? ONETOUCH VERIO test strip USE 1 STRIP TO CHECK GLUCOSE ONCE DAILY  ? prochlorperazine (COMPAZINE) 10 MG tablet Take 1 tablet (10 mg total) by mouth every 6 (six) hours as needed for nausea or vomiting.  ? RELION PEN NEEDLES 31G X 6 MM MISC USE AS DIRECTED WITH  VICTOZA  PEN  ? rosuvastatin (CRESTOR) 5 MG tablet Take  1 tablet (5 mg total) by mouth daily. for cholesterol.  ? sertraline (ZOLOFT) 100 MG tablet 150 mg daily which is 1-1/2 tablets  ? triamterene-hydrochlorothiazide (MAXZIDE-25) 37.5-25 MG tablet Take 1 tablet by mouth daily.  ? VITAMIN D PO Take by mouth.  ? ?No facility-administered encounter medications on file as of 05/13/2021.  ? ? ?Allergies (verified) ?Bactrim [sulfamethoxazole-trimethoprim], Lisinopril, Other, and Metformin and related  ? ?History: ?Past Medical History:  ?Diagnosis Date  ? Allergy   ? Anxiety   ? Arthritis   ? Chronic cough   ? Chronic low back pain   ? Chronic pain syndrome   ? Depression   ? Diabetes mellitus without complication ([place])   ? Folliculitis 8/67/6720  ? GERD (gastroesophageal reflux disease)   ? Hidradenitis suppurativa   ? High  cholesterol   ? Hypertension   ? IBS (irritable bowel syndrome)   ? Lumbar spondylolysis   ? Migraine   ? Migraine headache   ? Pulmonary nodule   ? Reactive airway disease 01/2018  ? per PFT's  ? Sleep apnea   ? ?Past Surgical History:  ?Procedure Laterality Date  ? ABDOMINAL HYSTERECTOMY    ? COLONOSCOPY WITH PROPOFOL N/A 04/08/2015  ? Dr. Gala Romney: normal, repeat in 10 years   ? ENDOMETRIAL ABLATION    ? FOREIGN BODY REMOVAL N/A 05/26/2013  ? Procedure: FOREIGN BODY REMOVAL ADULT ABDOMINAL WALL;  Surgeon: Jamesetta So, MD;  Location: [facility];  Service: General;  Laterality: N/A;  ? KNEE SURGERY Right 2012  ? PILONIDAL CYST EXCISION  June 2011  ? TUBAL LIGATION    ? ?Family History  ?Problem Relation Age of Onset  ? Stroke Mother   ? Hypertension Mother   ? Diabetes Mother   ? Hypercholesterolemia Mother   ? Depression Mother   ? Anxiety disorder Mother   ? Thyroid disease Father   ? Diabetes Father   ? Hypertension Sister   ? Hypertension Brother   ? Cancer Paternal Grandfather   ?     prostate  ? Colon cancer Maternal Grandfather   ? Asthma Brother   ? Healthy Daughter   ? Prostate cancer Other   ?     a grandfather  ? Migraines Neg Hx   ? ?Social History  ? ?Socioeconomic History  ? Marital status: Single  ?  Spouse name: Not on file  ? Number of children: 1  ? Years of education: Not on file  ? Highest education level: High school graduate  ?Occupational History  ? Occupation: unemployed  ?Tobacco Use  ? Smoking status: Never  ? Smokeless tobacco: Never  ?Vaping Use  ? Vaping Use: Never used  ?Substance and Sexual Activity  ? Alcohol use: No  ?  Alcohol/week: 0.0 standard drinks  ? Drug use: No  ? Sexual activity: Not Currently  ?  Birth control/protection: Surgical  ?Other Topics Concern  ? Not on file  ?Social History Narrative  ? Lives alone  ? Right handed  ? Caffeine: maybe 1 cup of tea if she goes out to dinner. "I don't really do caffeine".  ? 1 daughter, 1 grand daughter and 1 grandson.  ? ?Social  Determinants of Health  ? ?Financial Resource Strain: Low Risk   ? Difficulty of Paying Living Expenses: Not hard at all  ?Food Insecurity: No Food Insecurity  ? Worried About Charity fundraiser in the Last Year: Never true  ? Ran Out of Food in the Last  Year: Never true  ?Transportation Needs: No Transportation Needs  ? Lack of Transportation (Medical): No  ? Lack of Transportation (Non-Medical): No  ?Physical Activity: Insufficiently Active  ? Days of Exercise per Week: 3 days  ? Minutes of Exercise per Session: 30 min  ?Stress: Stress Concern Present  ? Feeling of Stress : Rather much  ?Social Connections: Moderately Integrated  ? Frequency of Communication with Friends and Family: More than three times a week  ? Frequency of Social Gatherings with Friends and Family: More than three times a week  ? Attends Religious Services: More than 4 times per year  ? Active Member of Clubs or Organizations: Yes  ? Attends Archivist Meetings: More than 4 times per year  ? Marital Status: Never married  ? ? ?Tobacco Counseling ?Counseling given: Not Answered ? ? ?Clinical Intake: ? ?Pre-visit preparation completed: Yes ? ?Pain : 0-10 ?Pain Score: 5  ?Pain Type: Acute pain ?Pain Location: Foot ?Pain Descriptors / Indicators: Aching, Dull ?Pain Onset: 1 to 4 weeks ago ?Pain Frequency: Several days a week ? ?  ? ?BMI - recorded: 33.35 ?Nutritional Status: BMI > 30  Obese ?Nutritional Risks: None ?Diabetes: Yes ? ?How often do you need to have someone help you when you read instructions, pamphlets, or other written materials from your doctor or pharmacy?: 1 - Never ? ?Diabetic?Nutrition Risk Assessment: ? ?Has the patient had any N/V/D within the last 2 months?  No  ?Does the patient have any non-healing wounds?  No  ?Has the patient had any unintentional weight loss or weight gain?  No  ? ?Diabetes: ? ?Is the patient diabetic?  Yes  ?If diabetic, was a CBG obtained today?  No  ?Did the patient bring in their  glucometer from home?  No  ?How often do you monitor your CBG's? 2x/wk.  ? ?Financial Strains and Diabetes Management: ? ?Are you having any financial strains with the device, your supplies or your medication? No .  ?Does the patient

## 2021-05-13 NOTE — Patient Instructions (Signed)
[name] , ?Thank you for taking time to come for your Medicare Wellness Visit. I appreciate your ongoing commitment to your health goals. Please review the following plan we discussed and let me know if I can assist you in the future.  ? ?Screening recommendations/referrals: ?Colonoscopy: Done 04/08/2015 Repeat in 10 years ? ?Mammogram: Done 03/14/2021 Repeat annually ? ?Bone Density: Due at age 56. ? ?Recommended yearly ophthalmology/optometry visit for glaucoma screening and checkup ?Recommended yearly dental visit for hygiene and checkup ? ?Vaccinations: ?Influenza vaccine: Done 12/03/2020 Repeat annually ? ?Pneumococcal vaccine: Done 11/10/2010  ?Tdap vaccine: Due Repeat in 10 years ? ?Shingles vaccine: Discussed.  ?Covid-19: Done 10/18/2019 and 11/18/2019 ? ?Advanced directives: Advance directive discussed with you today. I have provided a copy for you to complete at home and have notarized. Once this is complete please bring a copy in to our office so we can scan it into your chart. ? ? ?Conditions/risks identified: Aim for 30 minutes of exercise or brisk walking, 6-8 glasses of water, and 5 servings of fruits and vegetables each day. ? ? ?Next appointment: Follow up in one year for your annual wellness visit. 2024. ? ?Preventive Care 40-64 Years, Female ?Preventive care refers to lifestyle choices and visits with your health care provider that can promote health and wellness. ?What does preventive care include? ?A yearly physical exam. This is also called an annual well check. ?Dental exams once or twice a year. ?Routine eye exams. Ask your health care provider how often you should have your eyes checked. ?Personal lifestyle choices, including: ?Daily care of your teeth and gums. ?Regular physical activity. ?Eating a healthy diet. ?Avoiding tobacco and drug use. ?Limiting alcohol use. ?Practicing safe sex. ?Taking low-dose aspirin daily starting at age 30. ?Taking vitamin and mineral supplements as recommended  by your health care provider. ?What happens during an annual well check? ?The services and screenings done by your health care provider during your annual well check will depend on your age, overall health, lifestyle risk factors, and family history of disease. ?Counseling  ?Your health care provider may ask you questions about your: ?Alcohol use. ?Tobacco use. ?Drug use. ?Emotional well-being. ?Home and relationship well-being. ?Sexual activity. ?Eating habits. ?Work and work Statistician. ?Method of birth control. ?Menstrual cycle. ?Pregnancy history. ?Screening  ?You may have the following tests or measurements: ?Height, weight, and BMI. ?Blood pressure. ?Lipid and cholesterol levels. These may be checked every 5 years, or more frequently if you are over 81 years old. ?Skin check. ?Lung cancer screening. You may have this screening every year starting at age 32 if you have a 30-pack-year history of smoking and currently smoke or have quit within the past 15 years. ?Fecal occult blood test (FOBT) of the stool. You may have this test every year starting at age 61. ?Flexible sigmoidoscopy or colonoscopy. You may have a sigmoidoscopy every 5 years or a colonoscopy every 10 years starting at age 63. ?Hepatitis C blood test. ?Hepatitis B blood test. ?Sexually transmitted disease (STD) testing. ?Diabetes screening. This is done by checking your blood sugar (glucose) after you have not eaten for a while (fasting). You may have this done every 1-3 years. ?Mammogram. This may be done every 1-2 years. Talk to your health care provider about when you should start having regular mammograms. This may depend on whether you have a family history of breast cancer. ?BRCA-related cancer screening. This may be done if you have a family history of breast, ovarian, tubal, or peritoneal  cancers. ?Pelvic exam and Pap test. This may be done every 3 years starting at age 63. Starting at age 28, this may be done every 5 years if you have a  Pap test in combination with an HPV test. ?Bone density scan. This is done to screen for osteoporosis. You may have this scan if you are at high risk for osteoporosis. ?Discuss your test results, treatment options, and if necessary, the need for more tests with your health care provider. ?Vaccines  ?Your health care provider may recommend certain vaccines, such as: ?Influenza vaccine. This is recommended every year. ?Tetanus, diphtheria, and acellular pertussis (Tdap, Td) vaccine. You may need a Td booster every 10 years. ?Zoster vaccine. You may need this after age 22. ?Pneumococcal 13-valent conjugate (PCV13) vaccine. You may need this if you have certain conditions and were not previously vaccinated. ?Pneumococcal polysaccharide (PPSV23) vaccine. You may need one or two doses if you smoke cigarettes or if you have certain conditions. ?Talk to your health care provider about which screenings and vaccines you need and how often you need them. ?This information is not intended to replace advice given to you by your health care provider. Make sure you discuss any questions you have with your health care provider. ?Document Released: 02/22/2015 Document Revised: 10/16/2015 Document Reviewed: 11/27/2014 ?Elsevier Interactive Patient Education ? 2017 [employer] ? ? ? ?Fall Prevention in the Home ?Falls can cause injuries. They can happen to people of all ages. There are many things you can do to make your home safe and to help prevent falls. ?What can I do on the outside of my home? ?Regularly fix the edges of walkways and driveways and fix any cracks. ?Remove anything that might make you trip as you walk through a door, such as a raised step or threshold. ?Trim any bushes or trees on the path to your home. ?Use bright outdoor lighting. ?Clear any walking paths of anything that might make someone trip, such as rocks or tools. ?Regularly check to see if handrails are loose or broken. Make sure that both sides of any  steps have handrails. ?Any raised decks and porches should have guardrails on the edges. ?Have any leaves, snow, or ice cleared regularly. ?Use sand or salt on walking paths during winter. ?Clean up any spills in your garage right away. This includes oil or grease spills. ?What can I do in the bathroom? ?Use night lights. ?Install grab bars by the toilet and in the tub and shower. Do not use towel bars as grab bars. ?Use non-skid mats or decals in the tub or shower. ?If you need to sit down in the shower, use a plastic, non-slip stool. ?Keep the floor dry. Clean up any water that spills on the floor as soon as it happens. ?Remove soap buildup in the tub or shower regularly. ?Attach bath mats securely with double-sided non-slip rug tape. ?Do not have throw rugs and other things on the floor that can make you trip. ?What can I do in the bedroom? ?Use night lights. ?Make sure that you have a light by your bed that is easy to reach. ?Do not use any sheets or blankets that are too big for your bed. They should not hang down onto the floor. ?Have a firm chair that has side arms. You can use this for support while you get dressed. ?Do not have throw rugs and other things on the floor that can make you trip. ?What can I do in the  kitchen? ?Clean up any spills right away. ?Avoid walking on wet floors. ?Keep items that you use a lot in easy-to-reach places. ?If you need to reach something above you, use a strong step stool that has a grab bar. ?Keep electrical cords out of the way. ?Do not use floor polish or wax that makes floors slippery. If you must use wax, use non-skid floor wax. ?Do not have throw rugs and other things on the floor that can make you trip. ?What can I do with my stairs? ?Do not leave any items on the stairs. ?Make sure that there are handrails on both sides of the stairs and use them. Fix handrails that are broken or loose. Make sure that handrails are as long as the stairways. ?Check any carpeting to  make sure that it is firmly attached to the stairs. Fix any carpet that is loose or worn. ?Avoid having throw rugs at the top or bottom of the stairs. If you do have throw rugs, attach them to the floor

## 2021-05-20 ENCOUNTER — Ambulatory Visit (INDEPENDENT_AMBULATORY_CARE_PROVIDER_SITE_OTHER): Payer: Medicare HMO | Admitting: Family Medicine

## 2021-05-20 ENCOUNTER — Other Ambulatory Visit: Payer: Self-pay

## 2021-05-20 VITALS — BP 140/84 | HR 67 | Temp 96.8°F | Ht 65.0 in | Wt 200.0 lb

## 2021-05-20 DIAGNOSIS — I10 Essential (primary) hypertension: Secondary | ICD-10-CM

## 2021-05-20 DIAGNOSIS — E785 Hyperlipidemia, unspecified: Secondary | ICD-10-CM

## 2021-05-20 DIAGNOSIS — M25571 Pain in right ankle and joints of right foot: Secondary | ICD-10-CM

## 2021-05-20 DIAGNOSIS — M25572 Pain in left ankle and joints of left foot: Secondary | ICD-10-CM

## 2021-05-20 DIAGNOSIS — E1169 Type 2 diabetes mellitus with other specified complication: Secondary | ICD-10-CM | POA: Diagnosis not present

## 2021-05-20 NOTE — Progress Notes (Signed)
? ?  Subjective:  ? ? Patient ID: [name], female    DOB: Apr 29, 1965, 56 y.o.   MRN: [id number] ? ?HPI ?Bilateral ankle pain and swelling 3 to 4 weeks / inner  ?Over the past few weeks she has noticed that her ankle seems swollen on the anterior aspect as well as some tenderness and pain and discomfort.  She denies any injury to them.  She states she notices this more so late in the evening.  Denies any other setbacks or problems. ?She also has underlying diabetes and other health issues for which she will need to do lab work and follow-up visit in early summer ?Review of Systems ? ?   ?Objective:  ? Physical Exam ?General-in no acute distress ?Eyes-no discharge ?Lungs-respiratory rate normal, CTA ?CV-no murmurs,RRR ?Extremities skin warm dry no edema ?Neuro grossly normal ?Behavior normal, alert ? ?Ankle exam she has normal changes that are associated with getting older I do not find any evidence of any type of significant swelling or problems I would recommend Tylenol as needed ? ? ?   ?Assessment & Plan:  ?She did bring up that she is concerned about too many medications.  I reviewed over her medicines many of them are necessary but I did tell her she could try to cut back on anti-inflammatories as best as possible ? ?1. Hyperlipidemia associated with type 2 diabetes mellitus ([place]) ?Check labs before next visit we will handle that at that visit ? ?2. Type 2 diabetes mellitus with other specified complication, without long-term current use of insulin ([place]) ?Continue current medication watch diet initiate walking check labs before the next visit ? ?3. Primary hypertension ?Blood pressure decent control check labs before the next visit ? ?4. Acute bilateral ankle pain ?No major issues here.  Avoid anti-inflammatories when possible.  Fitted walking.  No need to see podiatry currently ?If any ongoing troubles follow-up ? ?

## 2021-05-20 NOTE — Patient Instructions (Addendum)
Try to minimize ibuprofen and Aleve and Naprosyn- these can be hard on your kidneys and elevate your blood pressure. ?Also try to fit in some walking on a regular basis-this can help with diabetes and blood pressure. ? ? ?DASH Eating Plan ?DASH stands for Dietary Approaches to Stop Hypertension. The DASH eating plan is a healthy eating plan that has been shown to: ?Reduce high blood pressure (hypertension). ?Reduce your risk for type 2 diabetes, heart disease, and stroke. ?Help with weight loss. ?What are tips for following this plan? ?Reading food labels ?Check food labels for the amount of salt (sodium) per serving. Choose foods with less than 5 percent of the Daily Value of sodium. Generally, foods with less than 300 milligrams (mg) of sodium per serving fit into this eating plan. ?To find whole grains, look for the word "whole" as the first word in the ingredient list. ?Shopping ?Buy products labeled as "low-sodium" or "no salt added." ?Buy fresh foods. Avoid canned foods and pre-made or frozen meals. ?Cooking ?Avoid adding salt when cooking. Use salt-free seasonings or herbs instead of table salt or sea salt. Check with your health care provider or pharmacist before using salt substitutes. ?Do not fry foods. Cook foods using healthy methods such as baking, boiling, grilling, roasting, and broiling instead. ?Cook with heart-healthy oils, such as olive, canola, avocado, soybean, or sunflower oil. ?Meal planning ? ?Eat a balanced diet that includes: ?4 or more servings of fruits and 4 or more servings of vegetables each day. Try to fill one-half of your plate with fruits and vegetables. ?6-8 servings of whole grains each day. ?Less than 6 oz (170 g) of lean meat, poultry, or fish each day. A 3-oz (85-g) serving of meat is about the same size as a deck of cards. One egg equals 1 oz (28 g). ?2-3 servings of low-fat dairy each day. One serving is 1 cup (237 mL). ?1 serving of nuts, seeds, or beans 5 times each  week. ?2-3 servings of heart-healthy fats. Healthy fats called omega-3 fatty acids are found in foods such as walnuts, flaxseeds, fortified milks, and eggs. These fats are also found in cold-water fish, such as sardines, salmon, and mackerel. ?Limit how much you eat of: ?Canned or prepackaged foods. ?Food that is high in trans fat, such as some fried foods. ?Food that is high in saturated fat, such as fatty meat. ?Desserts and other sweets, sugary drinks, and other foods with added sugar. ?Full-fat dairy products. ?Do not salt foods before eating. ?Do not eat more than 4 egg yolks a week. ?Try to eat at least 2 vegetarian meals a week. ?Eat more home-cooked food and less restaurant, buffet, and fast food. ?Lifestyle ?When eating at a restaurant, ask that your food be prepared with less salt or no salt, if possible. ?If you drink alcohol: ?Limit how much you use to: ?0-1 drink a day for women who are not pregnant. ?0-2 drinks a day for men. ?Be aware of how much alcohol is in your drink. In the [place] one drink equals one 12 oz bottle of beer (355 mL), one 5 oz glass of wine (148 mL), or one 1? oz glass of hard liquor (44 mL). ?General information ?Avoid eating more than 2,300 mg of salt a day. If you have hypertension, you may need to reduce your sodium intake to 1,500 mg a day. ?Work with your health care provider to maintain a healthy body weight or to lose weight. Ask what an ideal weight  is for you. ?Get at least 30 minutes of exercise that causes your heart to beat faster (aerobic exercise) most days of the week. Activities may include walking, swimming, or biking. ?Work with your health care provider or dietitian to adjust your eating plan to your individual calorie needs. ?What foods should I eat? ?Fruits ?All fresh, dried, or frozen fruit. Canned fruit in natural juice (without added sugar). ?Vegetables ?Fresh or frozen vegetables (raw, steamed, roasted, or grilled). Low-sodium or reduced-sodium tomato and  vegetable juice. Low-sodium or reduced-sodium tomato sauce and tomato paste. Low-sodium or reduced-sodium canned vegetables. ?Grains ?Whole-grain or whole-wheat bread. Whole-grain or whole-wheat pasta. Brown rice. Orpah Cobb. Bulgur. Whole-grain and low-sodium cereals. Pita bread. Low-fat, low-sodium crackers. Whole-wheat flour tortillas. ?Meats and other proteins ?Skinless chicken or [place]. Ground chicken or [place]. Pork with fat trimmed off. Fish and seafood. Egg whites. Dried beans, peas, or lentils. Unsalted nuts, nut butters, and seeds. Unsalted canned beans. Lean cuts of beef with fat trimmed off. Low-sodium, lean precooked or cured meat, such as sausages or meat loaves. ?Dairy ?Low-fat (1%) or fat-free (skim) milk. Reduced-fat, low-fat, or fat-free cheeses. Nonfat, low-sodium ricotta or cottage cheese. Low-fat or nonfat yogurt. Low-fat, low-sodium cheese. ?Fats and oils ?Soft margarine without trans fats. Vegetable oil. Reduced-fat, low-fat, or light mayonnaise and salad dressings (reduced-sodium). Canola, safflower, olive, avocado, soybean, and sunflower oils. Avocado. ?Seasonings and condiments ?Herbs. Spices. Seasoning mixes without salt. ?Other foods ?Unsalted popcorn and pretzels. Fat-free sweets. ?The items listed above may not be a complete list of foods and beverages you can eat. Contact a dietitian for more information. ?What foods should I avoid? ?Fruits ?Canned fruit in a light or heavy syrup. Fried fruit. Fruit in cream or butter sauce. ?Vegetables ?Creamed or fried vegetables. Vegetables in a cheese sauce. Regular canned vegetables (not low-sodium or reduced-sodium). Regular canned tomato sauce and paste (not low-sodium or reduced-sodium). Regular tomato and vegetable juice (not low-sodium or reduced-sodium). Rosita Fire. Olives. ?Grains ?Baked goods made with fat, such as croissants, muffins, or some breads. Dry pasta or rice meal packs. ?Meats and other proteins ?Fatty cuts of meat. Ribs.  Fried meat. Tomasa Blase. Bologna, salami, and other precooked or cured meats, such as sausages or meat loaves. Fat from the back of a pig (fatback). Bratwurst. Salted nuts and seeds. Canned beans with added salt. Canned or smoked fish. Whole eggs or egg yolks. Chicken or [place] with skin. ?Dairy ?Whole or 2% milk, cream, and half-and-half. Whole or full-fat cream cheese. Whole-fat or sweetened yogurt. Full-fat cheese. Nondairy creamers. Whipped toppings. Processed cheese and cheese spreads. ?Fats and oils ?Butter. Stick margarine. Lard. Shortening. Ghee. Bacon fat. Tropical oils, such as coconut, palm kernel, or palm oil. ?Seasonings and condiments ?Onion salt, garlic salt, seasoned salt, table salt, and sea salt. Worcestershire sauce. Tartar sauce. Barbecue sauce. Teriyaki sauce. Soy sauce, including reduced-sodium. Steak sauce. Canned and packaged gravies. Fish sauce. Oyster sauce. Cocktail sauce. Store-bought horseradish. Ketchup. Mustard. Meat flavorings and tenderizers. Bouillon cubes. Hot sauces. Pre-made or packaged marinades. Pre-made or packaged taco seasonings. Relishes. Regular salad dressings. ?Other foods ?Salted popcorn and pretzels. ?The items listed above may not be a complete list of foods and beverages you should avoid. Contact a dietitian for more information. ?Where to find more information ?National Heart, Lung, and Blood Institute: PopSteam.is ?American Heart Association: www.heart.org ?Academy of Nutrition and Dietetics: www.eatright.org ?National Kidney Foundation: www.kidney.org ?Summary ?The DASH eating plan is a healthy eating plan that has been shown to reduce high blood pressure (hypertension).  It may also reduce your risk for type 2 diabetes, heart disease, and stroke. ?When on the DASH eating plan, aim to eat more fresh fruits and vegetables, whole grains, lean proteins, low-fat dairy, and heart-healthy fats. ?With the DASH eating plan, you should limit salt (sodium) intake to 2,300 mg  a day. If you have hypertension, you may need to reduce your sodium intake to 1,500 mg a day. ?Work with your health care provider or dietitian to adjust your eating plan to your individual calorie needs. ?This

## 2021-07-08 ENCOUNTER — Ambulatory Visit: Payer: Medicare HMO | Admitting: Family Medicine

## 2021-07-22 ENCOUNTER — Other Ambulatory Visit: Payer: Self-pay | Admitting: Family Medicine

## 2021-08-15 ENCOUNTER — Telehealth: Payer: Self-pay

## 2021-08-15 ENCOUNTER — Other Ambulatory Visit: Payer: Self-pay

## 2021-08-15 DIAGNOSIS — Z114 Encounter for screening for human immunodeficiency virus [HIV]: Secondary | ICD-10-CM

## 2021-08-15 DIAGNOSIS — Z7251 High risk heterosexual behavior: Secondary | ICD-10-CM

## 2021-08-15 NOTE — Telephone Encounter (Signed)
Patient called and requested a complete STD panel to be ordered to go along with the rest of her blood work that her insurance will cover, she states there is no known exposure to anything in particular or symptoms noted. Please advise

## 2021-08-15 NOTE — Telephone Encounter (Signed)
Patient informed per drs orders and recommendations.

## 2021-08-15 NOTE — Telephone Encounter (Signed)
As for the screening for STDs If she has not had any true exposure typically and what we would recommend that his HIV antibody to make sure she has not had previous exposure  Now-if she is worried that she truly did have an exposure and is having symptoms we may have to do additional testing for gonorrhea chlamydia syphilis.  Otherwise we do not recommend just routine testing for those issues

## 2021-08-18 DIAGNOSIS — R252 Cramp and spasm: Secondary | ICD-10-CM | POA: Diagnosis not present

## 2021-08-18 DIAGNOSIS — I8393 Asymptomatic varicose veins of bilateral lower extremities: Secondary | ICD-10-CM | POA: Diagnosis not present

## 2021-08-18 DIAGNOSIS — I10 Essential (primary) hypertension: Secondary | ICD-10-CM | POA: Diagnosis not present

## 2021-08-18 DIAGNOSIS — E785 Hyperlipidemia, unspecified: Secondary | ICD-10-CM | POA: Diagnosis not present

## 2021-08-18 DIAGNOSIS — Z114 Encounter for screening for human immunodeficiency virus [HIV]: Secondary | ICD-10-CM | POA: Diagnosis not present

## 2021-08-18 DIAGNOSIS — E1169 Type 2 diabetes mellitus with other specified complication: Secondary | ICD-10-CM | POA: Diagnosis not present

## 2021-08-18 DIAGNOSIS — Z7251 High risk heterosexual behavior: Secondary | ICD-10-CM | POA: Diagnosis not present

## 2021-08-18 DIAGNOSIS — R69 Illness, unspecified: Secondary | ICD-10-CM | POA: Diagnosis not present

## 2021-08-19 ENCOUNTER — Telehealth: Payer: Self-pay

## 2021-08-19 ENCOUNTER — Encounter: Payer: Self-pay | Admitting: Family Medicine

## 2021-08-19 ENCOUNTER — Ambulatory Visit (INDEPENDENT_AMBULATORY_CARE_PROVIDER_SITE_OTHER): Payer: Medicare HMO | Admitting: Family Medicine

## 2021-08-19 VITALS — BP 114/70 | HR 75 | Temp 97.1°F | Ht 65.0 in | Wt 199.6 lb

## 2021-08-19 DIAGNOSIS — I8393 Asymptomatic varicose veins of bilateral lower extremities: Secondary | ICD-10-CM

## 2021-08-19 DIAGNOSIS — E1169 Type 2 diabetes mellitus with other specified complication: Secondary | ICD-10-CM

## 2021-08-19 DIAGNOSIS — E785 Hyperlipidemia, unspecified: Secondary | ICD-10-CM | POA: Diagnosis not present

## 2021-08-19 DIAGNOSIS — R252 Cramp and spasm: Secondary | ICD-10-CM | POA: Diagnosis not present

## 2021-08-19 LAB — HIV ANTIBODY (ROUTINE TESTING W REFLEX): HIV Screen 4th Generation wRfx: NONREACTIVE

## 2021-08-19 LAB — RPR: RPR Ser Ql: NONREACTIVE

## 2021-08-19 LAB — SPECIMEN STATUS REPORT

## 2021-08-19 MED ORDER — GABAPENTIN 100 MG PO CAPS: 100.0000 mg | ORAL_CAPSULE | Freq: Every day | ORAL | 1 refills | Status: DC

## 2021-08-19 MED ORDER — AMLODIPINE BESYLATE 5 MG PO TABS
5.0000 mg | ORAL_TABLET | Freq: Every day | ORAL | 1 refills | Status: DC
Start: 2021-08-19 — End: 2022-04-30

## 2021-08-19 MED ORDER — LOSARTAN POTASSIUM 25 MG PO TABS
25.0000 mg | ORAL_TABLET | Freq: Every day | ORAL | 1 refills | Status: DC
Start: 2021-08-19 — End: 2022-01-05

## 2021-08-19 MED ORDER — ROSUVASTATIN CALCIUM 5 MG PO TABS: 5.0000 mg | ORAL_TABLET | Freq: Every day | ORAL | 1 refills | Status: DC

## 2021-08-19 MED ORDER — SERTRALINE HCL 100 MG PO TABS: ORAL_TABLET | ORAL | 1 refills | Status: DC

## 2021-08-19 MED ORDER — VICTOZA 18 MG/3ML ~~LOC~~ SOPN
PEN_INJECTOR | SUBCUTANEOUS | 5 refills | Status: DC
Start: 2021-08-19 — End: 2022-05-29

## 2021-08-19 NOTE — Telephone Encounter (Signed)
We will see what the next couple days holds regarding the blood work thanks

## 2021-08-19 NOTE — Telephone Encounter (Signed)
Spoke with [facility] to add on bmp, lipid, liver panel, magnesium , unable to add on a1c, it will take approx 2 days to confirm they have enough serum to run these tests, she will also need to have additional blood drawn for the a1c test.

## 2021-08-19 NOTE — Progress Notes (Signed)
   Subjective:    Patient ID: [name], female    DOB: 09-10-1965, 56 y.o.   MRN: [id number]  HPI  Patient here for 4 month follow up Leg cramps  Asymptomatic varicose veins of both lower extremities  Hyperlipidemia associated with type 2 diabetes mellitus (HCC) - Plan: rosuvastatin (CRESTOR) 5 MG tablet  Type 2 diabetes mellitus with other specified complication, without long-term current use of insulin (HCC)  Review of Systems     Objective:   Physical Exam  General-in no acute distress Eyes-no discharge Lungs-respiratory rate normal, CTA CV-no murmurs,RRR Extremities skin warm dry no edema Neuro grossly normal Behavior normal, alert       Assessment & Plan:  1. Leg cramps Intermittent leg cramps stretches shown, labs ordered await results  2. Asymptomatic varicose veins of both lower extremities I do not feel this is causing her cramps  3. Hyperlipidemia associated with type 2 diabetes mellitus (HCC) 1Continue cholesterol medicine. - rosuvastatin (CRESTOR) 5 MG tablet; Take 1 tablet (5 mg total) by mouth daily. for cholesterol.  Dispense: 90 tablet; Refill: 1  4. Type 2 diabetes mellitus with other specified complication, without long-term current use of insulin (HCC) Continue diabetes treatment including Victoza but patient to check into weekly GLP-1's let us know if 1 is covered we may be switching her  Follow-up in 4 months Await the findings of the labs [facility] sent today  Visit summary  Unable We will order them again

## 2021-08-20 LAB — CHLAMYDIA/GONOCOCCUS/TRICHOMONAS, NAA
Chlamydia by NAA: NEGATIVE
Gonococcus by NAA: NEGATIVE
Trich vag by NAA: NEGATIVE

## 2021-08-21 ENCOUNTER — Other Ambulatory Visit: Payer: Self-pay | Admitting: Family Medicine

## 2021-08-21 DIAGNOSIS — E1169 Type 2 diabetes mellitus with other specified complication: Secondary | ICD-10-CM

## 2021-08-21 LAB — SPECIMEN STATUS REPORT

## 2021-08-21 LAB — BASIC METABOLIC PANEL (7)
BUN/Creatinine Ratio: 24 — ABNORMAL HIGH (ref 9–23)
BUN: 18 mg/dL (ref 6–24)
CO2: 24 mmol/L (ref 20–29)
Chloride: 97 mmol/L (ref 96–106)
Creatinine, Ser: 0.74 mg/dL (ref 0.57–1.00)
Glucose: 106 mg/dL — ABNORMAL HIGH (ref 70–99)
Potassium: 4.2 mmol/L (ref 3.5–5.2)
Sodium: 137 mmol/L (ref 134–144)
eGFR: 94 mL/min/{1.73_m2} (ref >59)

## 2021-08-21 LAB — HEPATIC FUNCTION PANEL
ALT: 10 IU/L (ref 0–32)
AST: 14 IU/L (ref 0–40)
Albumin: 4.7 g/dL (ref 3.8–4.9)
Alkaline Phosphatase: 73 IU/L (ref 44–121)
Bilirubin Total: 0.4 mg/dL (ref 0.0–1.2)
Bilirubin, Direct: 0.14 mg/dL (ref 0.00–0.40)
Total Protein: 8 g/dL (ref 6.0–8.5)

## 2021-08-21 LAB — LIPID PANEL W/O CHOL/HDL RATIO
Cholesterol, Total: 131 mg/dL (ref 100–199)
HDL: 38 mg/dL — ABNORMAL LOW (ref >39)
LDL Chol Calc (NIH): 73 mg/dL (ref 0–99)
Triglycerides: 109 mg/dL (ref 0–149)
VLDL Cholesterol Cal: 20 mg/dL (ref 5–40)

## 2021-08-21 LAB — MAGNESIUM: Magnesium: 2 mg/dL (ref 1.6–2.3)

## 2021-08-22 NOTE — Telephone Encounter (Signed)
Patient aware see result notes

## 2021-08-25 DIAGNOSIS — E785 Hyperlipidemia, unspecified: Secondary | ICD-10-CM | POA: Diagnosis not present

## 2021-08-25 DIAGNOSIS — E1169 Type 2 diabetes mellitus with other specified complication: Secondary | ICD-10-CM | POA: Diagnosis not present

## 2021-08-25 DIAGNOSIS — I10 Essential (primary) hypertension: Secondary | ICD-10-CM | POA: Diagnosis not present

## 2021-08-26 LAB — BASIC METABOLIC PANEL
BUN/Creatinine Ratio: 12 (ref 9–23)
BUN: 11 mg/dL (ref 6–24)
CO2: 27 mmol/L (ref 20–29)
Calcium: 10.4 mg/dL — ABNORMAL HIGH (ref 8.7–10.2)
Chloride: 97 mmol/L (ref 96–106)
Creatinine, Ser: 0.91 mg/dL (ref 0.57–1.00)
Glucose: 105 mg/dL — ABNORMAL HIGH (ref 70–99)
Potassium: 4.8 mmol/L (ref 3.5–5.2)
Sodium: 138 mmol/L (ref 134–144)
eGFR: 74 mL/min/{1.73_m2} (ref >59)

## 2021-08-26 LAB — HEPATIC FUNCTION PANEL
ALT: 10 IU/L (ref 0–32)
AST: 12 IU/L (ref 0–40)
Albumin: 4.4 g/dL (ref 3.8–4.9)
Alkaline Phosphatase: 76 IU/L (ref 44–121)
Bilirubin Total: 0.4 mg/dL (ref 0.0–1.2)
Bilirubin, Direct: 0.14 mg/dL (ref 0.00–0.40)
Total Protein: 7.5 g/dL (ref 6.0–8.5)

## 2021-08-26 LAB — LIPID PANEL
Chol/HDL Ratio: 3.2 ratio (ref 0.0–4.4)
Cholesterol, Total: 134 mg/dL (ref 100–199)
HDL: 42 mg/dL (ref >39)
LDL Chol Calc (NIH): 75 mg/dL (ref 0–99)
Triglycerides: 87 mg/dL (ref 0–149)
VLDL Cholesterol Cal: 17 mg/dL (ref 5–40)

## 2021-08-26 LAB — HEMOGLOBIN A1C
Est. average glucose Bld gHb Est-mCnc: 134 mg/dL
Hgb A1c MFr Bld: 6.3 % — ABNORMAL HIGH (ref 4.8–5.6)

## 2021-08-28 ENCOUNTER — Other Ambulatory Visit: Payer: Self-pay | Admitting: *Deleted

## 2021-09-04 LAB — PARATHYROID HORMONE, INTACT (NO CA): PTH: 25 pg/mL (ref 15–65)

## 2021-09-04 LAB — CALCIUM, IONIZED: Calcium, Ion: 5.3 mg/dL (ref 4.5–5.6)

## 2021-09-13 ENCOUNTER — Other Ambulatory Visit: Payer: Self-pay | Admitting: Family Medicine

## 2021-10-02 ENCOUNTER — Other Ambulatory Visit: Payer: Self-pay | Admitting: Family Medicine

## 2021-10-02 ENCOUNTER — Ambulatory Visit
Admission: RE | Admit: 2021-10-02 | Discharge: 2021-10-02 | Disposition: A | Discharge status: Home / Self Care | Payer: Medicare HMO | Source: Ambulatory Visit | Attending: Family Medicine | Admitting: Family Medicine

## 2021-10-02 ENCOUNTER — Telehealth: Payer: Self-pay

## 2021-10-02 DIAGNOSIS — N6320 Unspecified lump in the left breast, unspecified quadrant: Secondary | ICD-10-CM

## 2021-10-02 DIAGNOSIS — N6310 Unspecified lump in the right breast, unspecified quadrant: Secondary | ICD-10-CM

## 2021-10-02 DIAGNOSIS — N6311 Unspecified lump in the right breast, upper outer quadrant: Secondary | ICD-10-CM | POA: Diagnosis not present

## 2021-10-02 DIAGNOSIS — R928 Other abnormal and inconclusive findings on diagnostic imaging of breast: Secondary | ICD-10-CM | POA: Diagnosis not present

## 2021-10-02 DIAGNOSIS — N6321 Unspecified lump in the left breast, upper outer quadrant: Secondary | ICD-10-CM | POA: Diagnosis not present

## 2021-10-02 DIAGNOSIS — N6323 Unspecified lump in the left breast, lower outer quadrant: Secondary | ICD-10-CM | POA: Diagnosis not present

## 2021-10-02 NOTE — Telephone Encounter (Signed)
Patient is requesting her last 3 lab results to be mailed to her.

## 2021-10-02 NOTE — Telephone Encounter (Signed)
She may have a copy of her labs that we have ordered from current all the way back through December 2022 Thank you

## 2021-10-03 NOTE — Telephone Encounter (Signed)
Copy of labs are up front and ready for pick up.

## 2021-12-22 ENCOUNTER — Ambulatory Visit (INDEPENDENT_AMBULATORY_CARE_PROVIDER_SITE_OTHER): Payer: Medicare HMO | Admitting: Family Medicine

## 2021-12-22 ENCOUNTER — Other Ambulatory Visit: Payer: Self-pay | Admitting: Family Medicine

## 2021-12-22 VITALS — BP 148/80 | HR 82 | Temp 97.7°F | Ht 65.0 in | Wt 207.0 lb

## 2021-12-22 DIAGNOSIS — Z23 Encounter for immunization: Secondary | ICD-10-CM

## 2021-12-22 DIAGNOSIS — E785 Hyperlipidemia, unspecified: Secondary | ICD-10-CM

## 2021-12-22 DIAGNOSIS — R252 Cramp and spasm: Secondary | ICD-10-CM

## 2021-12-22 DIAGNOSIS — R0609 Other forms of dyspnea: Secondary | ICD-10-CM

## 2021-12-22 DIAGNOSIS — M25561 Pain in right knee: Secondary | ICD-10-CM

## 2021-12-22 DIAGNOSIS — E1169 Type 2 diabetes mellitus with other specified complication: Secondary | ICD-10-CM

## 2021-12-22 DIAGNOSIS — R7989 Other specified abnormal findings of blood chemistry: Secondary | ICD-10-CM

## 2021-12-22 DIAGNOSIS — M791 Myalgia, unspecified site: Secondary | ICD-10-CM | POA: Diagnosis not present

## 2021-12-22 DIAGNOSIS — M25562 Pain in left knee: Secondary | ICD-10-CM | POA: Diagnosis not present

## 2021-12-22 MED ORDER — VALSARTAN 80 MG PO TABS: 80.0000 mg | ORAL_TABLET | Freq: Every day | ORAL | 3 refills | Status: DC

## 2021-12-22 MED ORDER — METOPROLOL SUCCINATE ER 50 MG PO TB24
50.0000 mg | ORAL_TABLET | Freq: Every day | ORAL | 0 refills | Status: DC
Start: 2021-12-22 — End: 2022-04-30

## 2021-12-22 NOTE — Progress Notes (Signed)
   Subjective:    Patient ID: [name], female    DOB: September 16, 1965, 56 y.o.   MRN: [id number]  Diabetes She presents for her follow-up diabetic visit. She has type 2 diabetes mellitus. Current diabetic treatments: Victoza.  Hypertension This is a chronic problem. Treatments tried: amlodipine, triamterine- hctz, metoprolol, losartan.   C/o ankle and leg swelling and cramps, leg heaviness, cramps and pain   2 to 3 months  Review of Systems     Objective:   Physical Exam General-in no acute distress Eyes-no discharge Lungs-respiratory rate normal, CTA CV-no murmurs,RRR Extremities skin warm dry no edema Neuro grossly normal Behavior normal, alert Strength in legs good pulses in feet good patient has arthralgias in the ankles and needs       Assessment & Plan:  1. Type 2 diabetes mellitus with other specified complication, without long-term current use of insulin (HCC) We will check her A1c minimize starches in diet stay physically active - C-reactive protein - Sedimentation Rate - CK - D-dimer, quantitative - Brain natriuretic peptide - ANA - Hemoglobin A1c - Microalbumin/Creatinine Ratio, Urine - Basic Metabolic Panel - Magnesium  2. Leg cramps She has a lot of myalgias and leg cramps arthralgias along with shortness of breath intermittent tenderness in the muscles will need to do additional testing to see if there is any signs of DVT, heart failure, underlying autoimmune - C-reactive protein - Sedimentation Rate - CK - D-dimer, quantitative - Brain natriuretic peptide - ANA - Hemoglobin A1c - Microalbumin/Creatinine Ratio, Urine - Basic Metabolic Panel - Magnesium  3. Hyperlipidemia associated with type 2 diabetes mellitus (HCC) Continue cholesterol treatments continue healthy diet - C-reactive protein - Sedimentation Rate - CK - D-dimer, quantitative - Brain natriuretic peptide - ANA - Hemoglobin A1c - Microalbumin/Creatinine Ratio, Urine -  Basic Metabolic Panel - Magnesium  4. Hypercalcemia Previous hyperparathyroidism work-up was negative - C-reactive protein - Sedimentation Rate - CK - D-dimer, quantitative - Brain natriuretic peptide - ANA - Hemoglobin A1c - Microalbumin/Creatinine Ratio, Urine - Basic Metabolic Panel - Magnesium  5. Arthralgia of both knees Recommend Tylenol recommend additional lab work - C-reactive protein - Sedimentation Rate - CK - D-dimer, quantitative - Brain natriuretic peptide - ANA - Hemoglobin A1c - Microalbumin/Creatinine Ratio, Urine - Basic Metabolic Panel - Magnesium  6. Myalgia Additional lab work await results - C-reactive protein - Sedimentation Rate - CK - D-dimer, quantitative - Brain natriuretic peptide - ANA - Hemoglobin A1c - Microalbumin/Creatinine Ratio, Urine - Basic Metabolic Panel - Magnesium  7. DOE (dyspnea on exertion) I do not hear any CHF on physical exam but we will check a D-dimer and BNP - C-reactive protein - Sedimentation Rate - CK - D-dimer, quantitative - Brain natriuretic peptide - ANA - Hemoglobin A1c - Microalbumin/Creatinine Ratio, Urine - Basic Metabolic Panel - Magnesium  8. Immunization due Today - Flu Vaccine QUAD 13mo+IM (Fluarix, Fluzone & Alfiuria Quad PF)  Blood pressure elevated Stop triamterene Start valsartan 80 mg Continue amlodipine 5 mg Recheck again in approximately 2 weeks

## 2021-12-23 DIAGNOSIS — R0609 Other forms of dyspnea: Secondary | ICD-10-CM | POA: Diagnosis not present

## 2021-12-23 DIAGNOSIS — M25562 Pain in left knee: Secondary | ICD-10-CM | POA: Diagnosis not present

## 2021-12-23 DIAGNOSIS — R252 Cramp and spasm: Secondary | ICD-10-CM | POA: Diagnosis not present

## 2021-12-23 DIAGNOSIS — M25561 Pain in right knee: Secondary | ICD-10-CM | POA: Diagnosis not present

## 2021-12-23 DIAGNOSIS — M791 Myalgia, unspecified site: Secondary | ICD-10-CM | POA: Diagnosis not present

## 2021-12-23 DIAGNOSIS — E1169 Type 2 diabetes mellitus with other specified complication: Secondary | ICD-10-CM | POA: Diagnosis not present

## 2021-12-23 DIAGNOSIS — E785 Hyperlipidemia, unspecified: Secondary | ICD-10-CM | POA: Diagnosis not present

## 2021-12-24 ENCOUNTER — Telehealth: Payer: Self-pay | Admitting: Family Medicine

## 2021-12-24 LAB — BASIC METABOLIC PANEL
BUN/Creatinine Ratio: 17 (ref 9–23)
BUN: 13 mg/dL (ref 6–24)
CO2: 25 mmol/L (ref 20–29)
Calcium: 10.3 mg/dL — ABNORMAL HIGH (ref 8.7–10.2)
Chloride: 99 mmol/L (ref 96–106)
Creatinine, Ser: 0.77 mg/dL (ref 0.57–1.00)
Glucose: 129 mg/dL — ABNORMAL HIGH (ref 70–99)
Potassium: 4.3 mmol/L (ref 3.5–5.2)
Sodium: 139 mmol/L (ref 134–144)
eGFR: 89 mL/min/{1.73_m2} (ref >59)

## 2021-12-24 LAB — MICROALBUMIN / CREATININE URINE RATIO
Creatinine, Urine: 63.5 mg/dL
Microalb/Creat Ratio: 17 mg/g creat (ref 0–29)
Microalbumin, Urine: 11.1 ug/mL

## 2021-12-24 LAB — BRAIN NATRIURETIC PEPTIDE: BNP: 11.2 pg/mL (ref 0.0–100.0)

## 2021-12-24 LAB — HEMOGLOBIN A1C
Est. average glucose Bld gHb Est-mCnc: 146 mg/dL
Hgb A1c MFr Bld: 6.7 % — ABNORMAL HIGH (ref 4.8–5.6)

## 2021-12-24 LAB — ANA: Anti Nuclear Antibody (ANA): NEGATIVE

## 2021-12-24 LAB — C-REACTIVE PROTEIN: CRP: 13 mg/L — ABNORMAL HIGH (ref 0–10)

## 2021-12-24 LAB — CK: Total CK: 74 U/L (ref 32–182)

## 2021-12-24 LAB — SEDIMENTATION RATE: Sed Rate: 65 mm/hr — ABNORMAL HIGH (ref 0–40)

## 2021-12-24 LAB — D-DIMER, QUANTITATIVE: D-DIMER: 0.6 mg/L FEU — ABNORMAL HIGH (ref 0.00–0.49)

## 2021-12-24 LAB — MAGNESIUM: Magnesium: 2.2 mg/dL (ref 1.6–2.3)

## 2021-12-24 NOTE — Telephone Encounter (Signed)
Nurses-please see result note patient will need Doppler studies of the legs preferably on Thursday thank you

## 2021-12-26 ENCOUNTER — Ambulatory Visit (HOSPITAL_COMMUNITY)
Admission: RE | Admit: 2021-12-26 | Discharge: 2021-12-26 | Disposition: A | Discharge status: Home / Self Care | Payer: Medicare HMO | Source: Ambulatory Visit | Attending: Family Medicine | Admitting: Family Medicine

## 2021-12-26 ENCOUNTER — Other Ambulatory Visit: Payer: Self-pay | Admitting: *Deleted

## 2021-12-26 ENCOUNTER — Encounter: Payer: Self-pay | Admitting: *Deleted

## 2021-12-26 DIAGNOSIS — R252 Cramp and spasm: Secondary | ICD-10-CM

## 2021-12-26 DIAGNOSIS — M79604 Pain in right leg: Secondary | ICD-10-CM | POA: Diagnosis not present

## 2021-12-26 DIAGNOSIS — M79605 Pain in left leg: Secondary | ICD-10-CM | POA: Diagnosis not present

## 2021-12-26 DIAGNOSIS — R7989 Other specified abnormal findings of blood chemistry: Secondary | ICD-10-CM

## 2021-12-26 NOTE — Addendum Note (Signed)
Addended by: Margaretha Sheffield on: 12/26/2021 09:34 AM   Modules accepted: Orders

## 2021-12-26 NOTE — Telephone Encounter (Signed)
See result note.  

## 2021-12-29 NOTE — Addendum Note (Signed)
Addended by: Margaretha Sheffield on: 12/29/2021 09:03 AM   Modules accepted: Orders

## 2021-12-30 ENCOUNTER — Telehealth: Payer: Self-pay

## 2021-12-30 NOTE — Telephone Encounter (Signed)
Caller name: SAANYA ZIESKE  On DPR?: Yes  Call back number: [phone] (mobile)  Provider they see: Babs Sciara, MD  Reason for call:[name] Called and wants a copy of her most recent labs she said she will pick them up when ready

## 2022-01-05 ENCOUNTER — Ambulatory Visit (INDEPENDENT_AMBULATORY_CARE_PROVIDER_SITE_OTHER): Payer: Medicare HMO | Admitting: Family Medicine

## 2022-01-05 VITALS — BP 136/70 | HR 56 | Temp 97.3°F | Ht 65.0 in | Wt 205.0 lb

## 2022-01-05 DIAGNOSIS — M19042 Primary osteoarthritis, left hand: Secondary | ICD-10-CM

## 2022-01-05 DIAGNOSIS — E119 Type 2 diabetes mellitus without complications: Secondary | ICD-10-CM

## 2022-01-05 DIAGNOSIS — R0789 Other chest pain: Secondary | ICD-10-CM | POA: Diagnosis not present

## 2022-01-05 DIAGNOSIS — M19041 Primary osteoarthritis, right hand: Secondary | ICD-10-CM

## 2022-01-05 DIAGNOSIS — I10 Essential (primary) hypertension: Secondary | ICD-10-CM

## 2022-01-05 MED ORDER — DICLOFENAC SODIUM 1 % EX GEL: 2.0000 g | Freq: Four times a day (QID) | CUTANEOUS | 2 refills | Status: DC

## 2022-01-05 MED ORDER — PANTOPRAZOLE SODIUM 40 MG PO TBEC: 40.0000 mg | DELAYED_RELEASE_TABLET | Freq: Every day | ORAL | 3 refills | Status: DC

## 2022-01-05 NOTE — Progress Notes (Signed)
   Subjective:    Patient ID: [name], female    DOB: Jul 25, 1965, 56 y.o.   MRN: [id number]  Hypertension This is a chronic problem. Treatments tried: valsartan, amlodipine.  She is trying to work harder on [employer] Recent lab work showed elevated A1c with onset of diabetes In addition to this at times she finds herself having slight chest discomfort after taking valsartan she describes as a burning similar to reflux.  She denies any chest pressure tightness or discomfort with walking or other exercise She does take her cholesterol medicine recent lab work looked good She also complains of intermittent tightness in the hands with soreness.  Denies other issues currently Patient reports having chest pain after taking valsartan   Review of Systems     Objective:   Physical Exam General-in no acute distress Eyes-no discharge Lungs-respiratory rate normal, CTA CV-no murmurs,RRR Extremities skin warm dry no edema Neuro grossly normal Behavior normal, alert Her hands have some changes of osteoarthritis but not severe       Assessment & Plan:   1. Diabetes mellitus without complication (HCC) Diabetes new onset.  More than likely related to dietary indiscretions.  Healthy diet recommended.  Check lab work again in 3 months with follow-up office visit if not doing better will add medicines - Basic Metabolic Panel - Hemoglobin A1c  2. Primary osteoarthritis of both hands Tylenol as needed has referral to see rheumatology  3. Primary hypertension Blood pressure good results currently on the medicine.  Recommend continuing these medicines.  I do not feel that the discomfort she is having is cardiac.  Could be reflux.  But her symptoms do not me anginal related issues - Basic Metabolic Panel - Hemoglobin A1c  4. Non-cardiac chest pain Noncardiac chest pain try a trial of Protonix 1 daily if not doing better in the next 2 weeks to notify us we would need to do  further work-up if not doing better.  Have a hard time associating valsartan with this discomfort.  Certainly further work-up if ongoing troubles otherwise follow-up 3 months

## 2022-01-05 NOTE — Patient Instructions (Signed)

## 2022-01-07 LAB — CALCIUM, IONIZED: Calcium, Ion: 5.4 mg/dL (ref 4.5–5.6)

## 2022-01-07 LAB — VITAMIN D 25 HYDROXY (VIT D DEFICIENCY, FRACTURES): Vit D, 25-Hydroxy: 33.1 ng/mL (ref 30.0–100.0)

## 2022-01-07 LAB — PTH-RELATED PEPTIDE: PTH-related peptide: <2 pmol/L

## 2022-04-06 ENCOUNTER — Ambulatory Visit
Admission: RE | Admit: 2022-04-06 | Discharge: 2022-04-06 | Disposition: A | Discharge status: Home / Self Care | Payer: Medicare HMO | Source: Ambulatory Visit | Attending: Family Medicine | Admitting: Family Medicine

## 2022-04-06 DIAGNOSIS — N6321 Unspecified lump in the left breast, upper outer quadrant: Secondary | ICD-10-CM | POA: Diagnosis not present

## 2022-04-06 DIAGNOSIS — N6320 Unspecified lump in the left breast, unspecified quadrant: Secondary | ICD-10-CM

## 2022-04-06 DIAGNOSIS — N6310 Unspecified lump in the right breast, unspecified quadrant: Secondary | ICD-10-CM

## 2022-04-06 DIAGNOSIS — R928 Other abnormal and inconclusive findings on diagnostic imaging of breast: Secondary | ICD-10-CM | POA: Diagnosis not present

## 2022-04-06 DIAGNOSIS — N6311 Unspecified lump in the right breast, upper outer quadrant: Secondary | ICD-10-CM | POA: Diagnosis not present

## 2022-04-06 DIAGNOSIS — N6323 Unspecified lump in the left breast, lower outer quadrant: Secondary | ICD-10-CM | POA: Diagnosis not present

## 2022-04-20 ENCOUNTER — Ambulatory Visit: Payer: Medicare HMO | Admitting: Family Medicine

## 2022-04-28 ENCOUNTER — Other Ambulatory Visit: Payer: Self-pay | Admitting: Family Medicine

## 2022-04-28 DIAGNOSIS — E785 Hyperlipidemia, unspecified: Secondary | ICD-10-CM

## 2022-04-30 ENCOUNTER — Ambulatory Visit (INDEPENDENT_AMBULATORY_CARE_PROVIDER_SITE_OTHER): Payer: Medicare HMO | Admitting: Family Medicine

## 2022-04-30 ENCOUNTER — Telehealth: Payer: Self-pay

## 2022-04-30 VITALS — BP 137/87 | Ht 65.0 in | Wt 197.0 lb

## 2022-04-30 DIAGNOSIS — R200 Anesthesia of skin: Secondary | ICD-10-CM | POA: Diagnosis not present

## 2022-04-30 DIAGNOSIS — E785 Hyperlipidemia, unspecified: Secondary | ICD-10-CM | POA: Diagnosis not present

## 2022-04-30 DIAGNOSIS — E119 Type 2 diabetes mellitus without complications: Secondary | ICD-10-CM

## 2022-04-30 DIAGNOSIS — I10 Essential (primary) hypertension: Secondary | ICD-10-CM

## 2022-04-30 DIAGNOSIS — F324 Major depressive disorder, single episode, in partial remission: Secondary | ICD-10-CM

## 2022-04-30 DIAGNOSIS — E1169 Type 2 diabetes mellitus with other specified complication: Secondary | ICD-10-CM

## 2022-04-30 DIAGNOSIS — R69 Illness, unspecified: Secondary | ICD-10-CM | POA: Diagnosis not present

## 2022-04-30 DIAGNOSIS — Z8349 Family history of other endocrine, nutritional and metabolic diseases: Secondary | ICD-10-CM

## 2022-04-30 MED ORDER — AMLODIPINE BESYLATE 5 MG PO TABS: 5.0000 mg | ORAL_TABLET | Freq: Every day | ORAL | 1 refills | Status: DC

## 2022-04-30 MED ORDER — ALBUTEROL SULFATE HFA 108 (90 BASE) MCG/ACT IN AERS: 2.0000 | INHALATION_SPRAY | RESPIRATORY_TRACT | 5 refills | Status: AC | PRN

## 2022-04-30 MED ORDER — FLUTICASONE PROPIONATE 50 MCG/ACT NA SUSP: 2.0000 | Freq: Every day | NASAL | 5 refills | Status: AC | PRN

## 2022-04-30 MED ORDER — METOPROLOL SUCCINATE ER 50 MG PO TB24: 50.0000 mg | ORAL_TABLET | Freq: Every day | ORAL | 0 refills | Status: DC

## 2022-04-30 MED ORDER — PANTOPRAZOLE SODIUM 40 MG PO TBEC: 40.0000 mg | DELAYED_RELEASE_TABLET | Freq: Every day | ORAL | 3 refills | Status: DC

## 2022-04-30 MED ORDER — GABAPENTIN 100 MG PO CAPS: 100.0000 mg | ORAL_CAPSULE | Freq: Every day | ORAL | 1 refills | Status: DC

## 2022-04-30 MED ORDER — SERTRALINE HCL 100 MG PO TABS: ORAL_TABLET | ORAL | 1 refills | Status: DC

## 2022-04-30 NOTE — Telephone Encounter (Signed)
Pt was just seen by Dr Nicki Reaper she was telling him history of her mom thyroid Dr Nicki Reaper was going to order blood work on [place] she said her mom had moderately heterogenous MNG pr antibodies they removed and it was benign.

## 2022-04-30 NOTE — Telephone Encounter (Signed)
Based on this I would recommend adding TSH and free T4 to her previous ordered lab work from earlier today (sorry) but also recommend diagnosis to be family history of multinodular thyroid gland  Please let the patient know that we will do thyroid labs with her next labs and when she comes in on her next visit we can feel her thyroid if it is enlarged we would recommend an ultrasound

## 2022-04-30 NOTE — Progress Notes (Signed)
   Subjective:    Patient ID: [name], female    DOB: 1965-04-25, 57 y.o.   MRN: [id number]  Diabetes She presents for her follow-up diabetic visit. She has type 2 diabetes mellitus. Risk factors for coronary artery disease include diabetes mellitus, dyslipidemia, hypertension and post-menopausal. Current diabetic treatments: victoza. She is compliant with treatment all of the time.  Patient will do her lab work soon  Patient with mild allergic rhinitis states having some sinus symptoms related to allergies  Also has neuropathy tingling in her lower legs and feet but not severe.  Now she is noticing tingling in her hands to some degree. She is taking gabapentin does help a little bit She is using Victoza for her diabetes but is no longer going to be covered is being phased out we did discuss 3 different medicines Mounjaro, Ozempic, Trulicity she will look into these and then let us know Depression partial remission currently Needs blood work  Review of Systems     Objective:   Physical Exam  General-in no acute distress Eyes-no discharge Lungs-respiratory rate normal, CTA CV-no murmurs,RRR Extremities skin warm dry no edema Neuro grossly normal Behavior normal, alert  Diabetic foot exam normal Patient will do lab work we will let her know the results    Assessment & Plan:  1. Diabetes mellitus without complication ([place]) The patient was seen today as part of a comprehensive visit for diabetes. The importance of keeping her A1c at or below 7 range was discussed.  Discussed diet, activity, and medication compliance Emphasized healthy eating primarily with vegetables fruits and if utilizing meats lean meats such as chicken or fish grilled baked broiled Avoid sugary drinks Minimize and avoid processed foods Fit in regular physical activity preferably 25 to 30 minutes 4 times per week Standard follow-up visit recommended.  Patient aware lack of control and follow-up  increases risk of diabetic complications. Regular follow-up visits Yearly ophthalmology Yearly foot exam  - Hemoglobin A1c  2. Primary hypertension HTN- patient seen for follow-up regarding HTN.   Diet, medication compliance, appropriate labs and refills were completed.   Importance of keeping blood pressure under good control to lessen the risk of complications discussed Regular follow-up visits discussed  - Basic Metabolic Panel (7) - Hepatic Function Panel  3. Hyperlipidemia associated with type 2 diabetes mellitus (HCC) Hyperlipidemia-importance of diet, weight control, activity, compliance with medications discussed.   Recent labs reviewed.   Any additional labs or refills ordered.   Importance of keeping under good control discussed. Regular follow-up visits discussed  - Lipid panel  4. Hypercalcemia Follow-up on metabolic 7 to see where this is standing  5. Hand numbness Possible carpal tunnel.  Doubt that this is due to statin.  Recommend Ortho along with nerve conduction studies - Ambulatory referral to Orthopedics  6. Depression, major, single episode, in partial remission ([place]) Moods are doing better continue sertraline.  Follow-up within 6 months

## 2022-05-01 NOTE — Telephone Encounter (Signed)
Patient called and advised per Dr Nicki Reaper  Based on this I would recommend adding TSH and free T4 to her previous ordered lab work from earlier today (sorry) but also recommend diagnosis to be family history of multinodular thyroid gland   Please let the patient know that we will do thyroid labs with her next labs and when she comes in on her next visit we can feel her thyroid if it is enlarged we would recommend an ultrasound   Patient verbalized understanding and aggress with recommendations.

## 2022-05-04 ENCOUNTER — Other Ambulatory Visit: Payer: Self-pay | Admitting: Family Medicine

## 2022-05-04 DIAGNOSIS — E119 Type 2 diabetes mellitus without complications: Secondary | ICD-10-CM | POA: Diagnosis not present

## 2022-05-04 DIAGNOSIS — Z8349 Family history of other endocrine, nutritional and metabolic diseases: Secondary | ICD-10-CM | POA: Diagnosis not present

## 2022-05-04 DIAGNOSIS — I10 Essential (primary) hypertension: Secondary | ICD-10-CM | POA: Diagnosis not present

## 2022-05-04 DIAGNOSIS — E1169 Type 2 diabetes mellitus with other specified complication: Secondary | ICD-10-CM | POA: Diagnosis not present

## 2022-05-04 DIAGNOSIS — E785 Hyperlipidemia, unspecified: Secondary | ICD-10-CM | POA: Diagnosis not present

## 2022-05-04 NOTE — Telephone Encounter (Signed)
I believe what is going on here is that her insurance is no longer going to cover Victoza.  That is what she told me on her last visit.  Given that we can try Ozempic not sure if it is covered.  If it is not covered Mounjaro or Trulicity would be  Patient needs to also be aware that there is major shortage of all of these medicines because of lack of production if for some reason we send it in if he gets approved but there is no medicine to give to her then may have to consider Januvia  Discontinue Victoza Start Ozempic 0.25 once a week, may send in 1 month with 1 refill after 3 to 4 weeks patient is to give [place] update and we would bump up the dose

## 2022-05-04 NOTE — Telephone Encounter (Signed)
Refill on Ozempic sent to [facility]

## 2022-05-05 ENCOUNTER — Encounter: Payer: Self-pay | Admitting: Family Medicine

## 2022-05-05 LAB — BASIC METABOLIC PANEL (7)
BUN/Creatinine Ratio: 14 (ref 9–23)
BUN: 9 mg/dL (ref 6–24)
CO2: 25 mmol/L (ref 20–29)
Chloride: 100 mmol/L (ref 96–106)
Creatinine, Ser: 0.65 mg/dL (ref 0.57–1.00)
Glucose: 90 mg/dL (ref 70–99)
Potassium: 4.5 mmol/L (ref 3.5–5.2)
Sodium: 142 mmol/L (ref 134–144)
eGFR: 102 mL/min/{1.73_m2} (ref >59)

## 2022-05-05 LAB — LIPID PANEL
Chol/HDL Ratio: 2.9 ratio (ref 0.0–4.4)
Cholesterol, Total: 108 mg/dL (ref 100–199)
HDL: 37 mg/dL — ABNORMAL LOW (ref >39)
LDL Chol Calc (NIH): 57 mg/dL (ref 0–99)
Triglycerides: 67 mg/dL (ref 0–149)
VLDL Cholesterol Cal: 14 mg/dL (ref 5–40)

## 2022-05-05 LAB — HEPATIC FUNCTION PANEL
ALT: 10 IU/L (ref 0–32)
AST: 18 IU/L (ref 0–40)
Albumin: 4.7 g/dL (ref 3.8–4.9)
Alkaline Phosphatase: 107 IU/L (ref 44–121)
Bilirubin Total: 0.6 mg/dL (ref 0.0–1.2)
Bilirubin, Direct: 0.2 mg/dL (ref 0.00–0.40)
Total Protein: 8.2 g/dL (ref 6.0–8.5)

## 2022-05-05 LAB — HEMOGLOBIN A1C
Est. average glucose Bld gHb Est-mCnc: 134 mg/dL
Hgb A1c MFr Bld: 6.3 % — ABNORMAL HIGH (ref 4.8–5.6)

## 2022-05-05 LAB — TSH+FREE T4
Free T4: 1.62 ng/dL (ref 0.82–1.77)
TSH: 0.326 u[IU]/mL — ABNORMAL LOW (ref 0.450–4.500)

## 2022-05-05 NOTE — Progress Notes (Signed)
Please mail to patient does not utilize MyChart

## 2022-05-05 NOTE — Telephone Encounter (Signed)
Left message to return call 

## 2022-05-06 NOTE — Telephone Encounter (Signed)
Patient  wanted to try Ozempic- It is flagging saying Ozempic is not covered by patient's benefit plan - Mounjaro and trulicity are currently unavailable in any doses per pharmacy- Please advise

## 2022-05-07 ENCOUNTER — Other Ambulatory Visit: Payer: Self-pay | Admitting: Family Medicine

## 2022-05-07 NOTE — Telephone Encounter (Signed)
So this is a great conundrum-unfortunately due to unavailability of Trulicity and Mounjaro plus also Ozempic is not on her formulary nor is it really available essentially patient may well have to consider trying Januvia which is a oral pill or Rybelsus.-Not sure if either 1 are covered but we can look into it Please inform patient get her consent to move forward with these options then send message back to me thank you

## 2022-05-14 NOTE — Addendum Note (Signed)
Addended by: Dairl Ponder on: 05/14/2022 03:36 PM   Modules accepted: Orders

## 2022-05-14 NOTE — Telephone Encounter (Signed)
Nurse please close . 

## 2022-05-14 NOTE — Telephone Encounter (Signed)
Please close

## 2022-05-21 DIAGNOSIS — M180 Bilateral primary osteoarthritis of first carpometacarpal joints: Secondary | ICD-10-CM | POA: Diagnosis not present

## 2022-05-21 DIAGNOSIS — G5603 Carpal tunnel syndrome, bilateral upper limbs: Secondary | ICD-10-CM | POA: Diagnosis not present

## 2022-05-21 DIAGNOSIS — G5601 Carpal tunnel syndrome, right upper limb: Secondary | ICD-10-CM | POA: Diagnosis not present

## 2022-05-21 DIAGNOSIS — M1812 Unilateral primary osteoarthritis of first carpometacarpal joint, left hand: Secondary | ICD-10-CM | POA: Diagnosis not present

## 2022-05-21 DIAGNOSIS — G5602 Carpal tunnel syndrome, left upper limb: Secondary | ICD-10-CM | POA: Diagnosis not present

## 2022-05-22 ENCOUNTER — Telehealth: Payer: Self-pay

## 2022-05-22 ENCOUNTER — Ambulatory Visit (INDEPENDENT_AMBULATORY_CARE_PROVIDER_SITE_OTHER): Payer: Medicare HMO

## 2022-05-22 VITALS — BP 122/72 | Ht 65.0 in | Wt 196.0 lb

## 2022-05-22 DIAGNOSIS — Z0000 Encounter for general adult medical examination without abnormal findings: Secondary | ICD-10-CM | POA: Diagnosis not present

## 2022-05-22 NOTE — Telephone Encounter (Signed)
Patient seen for AWV and is asking for an rx for Ozempic to replace the Victoza that her insurance no longer covers; states that it was discussed in her last office visit.

## 2022-05-22 NOTE — Patient Instructions (Addendum)
[name] , Thank you for taking time to come for your Medicare Wellness Visit. I appreciate your ongoing commitment to your health goals. Please review the following plan we discussed and let me know if I can assist you in the future.   These are the goals we discussed:  Goals      Exercise 3x per week (30 min per time)     Increase exercise. Travel more.         This is a list of the screening recommended for you and due dates:  Health Maintenance  Topic Date Due   DTaP/Tdap/Td vaccine (1 - Tdap) Never done   Zoster (Shingles) Vaccine (1 of 2) Never done   Eye exam for diabetics  05/13/2022   Flu Shot  09/10/2022   Hemoglobin A1C  11/04/2022   Yearly kidney health urinalysis for diabetes  12/24/2022   Complete foot exam   04/30/2023   Yearly kidney function blood test for diabetes  05/04/2023   Medicare Annual Wellness Visit  05/22/2023   Mammogram  04/06/2024   Colon Cancer Screening  04/07/2025   Hepatitis C Screening: USPSTF Recommendation to screen - Ages 18-79 yo.  Completed   HIV Screening  Completed   HPV Vaccine  Aged Out   COVID-19 Vaccine  Discontinued    Advanced directives: Advance directive discussed with you today. I have provided a copy for you to complete at home and have notarized. Once this is complete please bring a copy in to our office so we can scan it into your chart.   Conditions/risks identified: Aim for 30 minutes of exercise or brisk walking, 6-8 glasses of water, and 5 servings of fruits and vegetables each day.   Next appointment: Follow up in one year for your annual wellness visit.   Preventive Care 40-64 Years, Female Preventive care refers to lifestyle choices and visits with your health care provider that can promote health and wellness. What does preventive care include? A yearly physical exam. This is also called an annual well check. Dental exams once or twice a year. Routine eye exams. Ask your health care provider how often you  should have your eyes checked. Personal lifestyle choices, including: Daily care of your teeth and gums. Regular physical activity. Eating a healthy diet. Avoiding tobacco and drug use. Limiting alcohol use. Practicing safe sex. Taking low-dose aspirin daily starting at age 81. Taking vitamin and mineral supplements as recommended by your health care provider. What happens during an annual well check? The services and screenings done by your health care provider during your annual well check will depend on your age, overall health, lifestyle risk factors, and family history of disease. Counseling  Your health care provider may ask you questions about your: Alcohol use. Tobacco use. Drug use. Emotional well-being. Home and relationship well-being. Sexual activity. Eating habits. Work and work Astron[employer]. Method of birth control. Menstrual cycle. Pregnancy history. Screening  You may have the following tests or measurements: Height, weight, and BMI. Blood pressure. Lipid and cholesterol levels. These may be checked every 5 years, or more frequently if you are over 69 years old. Skin check. Lung cancer screening. You may have this screening every year starting at age 46 if you have a 30-pack-year history of smoking and currently smoke or have quit within the past 15 years. Fecal occult blood test (FOBT) of the stool. You may have this test every year starting at age 40. Flexible sigmoidoscopy or colonoscopy. You may have  a sigmoidoscopy every 5 years or a colonoscopy every 10 years starting at age 63. Hepatitis C blood test. Hepatitis B blood test. Sexually transmitted disease (STD) testing. Diabetes screening. This is done by checking your blood sugar (glucose) after you have not eaten for a while (fasting). You may have this done every 1-3 years. Mammogram. This may be done every 1-2 years. Talk to your health care provider about when you should start having regular mammograms.  This may depend on whether you have a family history of breast cancer. BRCA-related cancer screening. This may be done if you have a family history of breast, ovarian, tubal, or peritoneal cancers. Pelvic exam and Pap test. This may be done every 3 years starting at age 56. Starting at age 73, this may be done every 5 years if you have a Pap test in combination with an HPV test. Bone density scan. This is done to screen for osteoporosis. You may have this scan if you are at high risk for osteoporosis. Discuss your test results, treatment options, and if necessary, the need for more tests with your health care provider. Vaccines  Your health care provider may recommend certain vaccines, such as: Influenza vaccine. This is recommended every year. Tetanus, diphtheria, and acellular pertussis (Tdap, Td) vaccine. You may need a Td booster every 10 years. Zoster vaccine. You may need this after age 39. Pneumococcal 13-valent conjugate (PCV13) vaccine. You may need this if you have certain conditions and were not previously vaccinated. Pneumococcal polysaccharide (PPSV23) vaccine. You may need one or two doses if you smoke cigarettes or if you have certain conditions. Talk to your health care provider about which screenings and vaccines you need and how often you need them. This information is not intended to replace advice given to you by your health care provider. Make sure you discuss any questions you have with your health care provider. Document Released: 02/22/2015 Document Revised: 10/16/2015 Document Reviewed: 11/27/2014 Elsevier Interactive Patient Education  2017 [employer].    Fall Prevention in the Home Falls can cause injuries. They can happen to people of all ages. There are many things you can do to make your home safe and to help prevent falls. What can I do on the outside of my home? Regularly fix the edges of walkways and driveways and fix any cracks. Remove anything that might  make you trip as you walk through a door, such as a raised step or threshold. Trim any bushes or trees on the path to your home. Use bright outdoor lighting. Clear any walking paths of anything that might make someone trip, such as rocks or tools. Regularly check to see if handrails are loose or broken. Make sure that both sides of any steps have handrails. Any raised decks and porches should have guardrails on the edges. Have any leaves, snow, or ice cleared regularly. Use sand or salt on walking paths during winter. Clean up any spills in your garage right away. This includes oil or grease spills. What can I do in the bathroom? Use night lights. Install grab bars by the toilet and in the tub and shower. Do not use towel bars as grab bars. Use non-skid mats or decals in the tub or shower. If you need to sit down in the shower, use a plastic, non-slip stool. Keep the floor dry. Clean up any water that spills on the floor as soon as it happens. Remove soap buildup in the tub or shower regularly. Attach bath  mats securely with double-sided non-slip rug tape. Do not have throw rugs and other things on the floor that can make you trip. What can I do in the bedroom? Use night lights. Make sure that you have a light by your bed that is easy to reach. Do not use any sheets or blankets that are too big for your bed. They should not hang down onto the floor. Have a firm chair that has side arms. You can use this for support while you get dressed. Do not have throw rugs and other things on the floor that can make you trip. What can I do in the kitchen? Clean up any spills right away. Avoid walking on wet floors. Keep items that you use a lot in easy-to-reach places. If you need to reach something above you, use a strong step stool that has a grab bar. Keep electrical cords out of the way. Do not use floor polish or wax that makes floors slippery. If you must use wax, use non-skid floor wax. Do  not have throw rugs and other things on the floor that can make you trip. What can I do with my stairs? Do not leave any items on the stairs. Make sure that there are handrails on both sides of the stairs and use them. Fix handrails that are broken or loose. Make sure that handrails are as long as the stairways. Check any carpeting to make sure that it is firmly attached to the stairs. Fix any carpet that is loose or worn. Avoid having throw rugs at the top or bottom of the stairs. If you do have throw rugs, attach them to the floor with carpet tape. Make sure that you have a light switch at the top of the stairs and the bottom of the stairs. If you do not have them, ask someone to add them for you. What else can I do to help prevent falls? Wear shoes that: Do not have high heels. Have rubber bottoms. Are comfortable and fit you well. Are closed at the toe. Do not wear sandals. If you use a stepladder: Make sure that it is fully opened. Do not climb a closed stepladder. Make sure that both sides of the stepladder are locked into place. Ask someone to hold it for you, if possible. Clearly mark and make sure that you can see: Any grab bars or handrails. First and last steps. Where the edge of each step is. Use tools that help you move around (mobility aids) if they are needed. These include: Canes. Walkers. Scooters. Crutches. Turn on the lights when you go into a dark area. Replace any light bulbs as soon as they burn out. Set up your furniture so you have a clear path. Avoid moving your furniture around. If any of your floors are uneven, fix them. If there are any pets around you, be aware of where they are. Review your medicines with your doctor. Some medicines can make you feel dizzy. This can increase your chance of falling. Ask your doctor what other things that you can do to help prevent falls. This information is not intended to replace advice given to you by your health care  provider. Make sure you discuss any questions you have with your health care provider. Document Released: 11/22/2008 Document Revised: 07/04/2015 Document Reviewed: 03/02/2014 Elsevier Interactive Patient Education  2017 [employer].

## 2022-05-22 NOTE — Progress Notes (Signed)
Subjective:   NALINI ALCARAZ is a 57 y.o. female who presents for Medicare Annual (Subsequent) preventive examination.  Review of Systems     Cardiac Risk Factors include: diabetes mellitus;dyslipidemia;hypertension;sedentary lifestyle     Objective:    Today's Vitals   05/22/22 1028  BP: 122/72  Weight: 196 lb (88.9 kg)  Height: 5\' 5"  (1.651 m)   Body mass index is 32.62 kg/m.     05/22/2022   10:58 AM 05/13/2021    9:18 AM 06/11/2020    1:58 PM 05/24/2020    2:40 PM 04/16/2018   12:23 PM 12/25/2017    2:18 PM 02/21/2016    2:00 PM  Advanced Directives  Does Patient Have a Medical Advance Directive? No No No No No No Yes  Type of Advance Directive       Living will  Does patient want to make changes to medical advance directive?       No - Patient declined  Would patient like information on creating a medical advance directive? Yes (MAU/Ambulatory/Procedural Areas - Information given) No - Patient declined No - Patient declined No - Patient declined No - Patient declined      Current Medications (verified) Outpatient Encounter Medications as of 05/22/2022  Medication Sig   albuterol (VENTOLIN HFA) 108 (90 Base) MCG/ACT inhaler Inhale 2 puffs into the lungs every 4 (four) hours as needed.   amLODipine (NORVASC) 5 MG tablet Take 1 tablet (5 mg total) by mouth daily.   Ascorbic Acid (VITAMIN C PO) Take 250 mg by mouth.   blood glucose meter kit and supplies Dispense based on patient and insurance preference. Use to test sugar once a day. For dx E11.9   fluticasone (FLONASE) 50 MCG/ACT nasal spray Place 2 sprays into both nostrils daily as needed for allergies or rhinitis.   gabapentin (NEURONTIN) 100 MG capsule Take 1 capsule (100 mg total) by mouth daily.   Lancets (ONETOUCH DELICA PLUS LANCET33G) MISC USE 1 LANCET TO CHECK GLUCOSE ONCE DAILY   liraglutide (VICTOZA) 18 MG/3ML SOPN INJECT 0.3MLS (1.8MG  TOTAL) INTO THE SKIN DAILY   metoprolol succinate (TOPROL-XL) 50 MG 24  hr tablet Take 1 tablet (50 mg total) by mouth daily. Take with or immediately following a meal.   Multiple Vitamins-Minerals (MULTIPLE VITAMINS/WOMENS PO) Take by mouth.   ONETOUCH VERIO test strip USE 1 STRIP TO CHECK GLUCOSE ONCE DAILY   pantoprazole (PROTONIX) 40 MG tablet Take 1 tablet (40 mg total) by mouth daily.   RELION PEN NEEDLES 31G X 6 MM MISC USE AS DIRECTED WITH  VICTOZA  PEN   rosuvastatin (CRESTOR) 5 MG tablet TAKE 1 TABLET BY MOUTH ONCE DAILY FOR CHOLESTEROL   sertraline (ZOLOFT) 100 MG tablet 150 mg daily which is 1-1/2 tablets   valsartan (DIOVAN) 80 MG tablet Take 1 tablet (80 mg total) by mouth daily.   VITAMIN D PO Take by mouth.   No facility-administered encounter medications on file as of 05/22/2022.    Allergies (verified) Bactrim [sulfamethoxazole-trimethoprim], Lisinopril, Other, and Metformin and related   History: Past Medical History:  Diagnosis Date   Allergy    Anxiety    Arthritis    Chronic cough    Chronic low back pain    Chronic pain syndrome    Depression    Diabetes mellitus without complication    Folliculitis 07/20/2012   GERD (gastroesophageal reflux disease)    Hidradenitis suppurativa    High cholesterol    Hypertension  IBS (irritable bowel syndrome)    Lumbar spondylolysis    Migraine    Migraine headache    Pulmonary nodule    Reactive airway disease 01/2018   per PFT's   Sleep apnea    Past Surgical History:  Procedure Laterality Date   ABDOMINAL HYSTERECTOMY     COLONOSCOPY WITH PROPOFOL N/A 04/08/2015   Dr. Jena Gauss: normal, repeat in 10 years    ENDOMETRIAL ABLATION     FOREIGN BODY REMOVAL N/A 05/26/2013   Procedure: FOREIGN BODY REMOVAL ADULT ABDOMINAL WALL;  Surgeon: Dalia Heading, MD;  Location: [facility];  Service: General;  Laterality: N/A;   KNEE SURGERY Right 2012   PILONIDAL CYST EXCISION  June 2011   TUBAL LIGATION     Family History  Problem Relation Age of Onset   Stroke Mother    Hypertension Mother     Diabetes Mother    Hypercholesterolemia Mother    Depression Mother    Anxiety disorder Mother    Thyroid disease Father    Diabetes Father    Hypertension Sister    Hypertension Brother    Cancer Paternal Grandfather        prostate   Colon cancer Maternal Grandfather    Asthma Brother    Healthy Daughter    Prostate cancer Other        a grandfather   Migraines Neg Hx    Social History   Socioeconomic History   Marital status: Single    Spouse name: Not on file   Number of children: 1   Years of education: Not on file   Highest education level: High school graduate  Occupational History   Occupation: unemployed  Tobacco Use   Smoking status: Never   Smokeless tobacco: Never  Vaping Use   Vaping Use: Never used  Substance and Sexual Activity   Alcohol use: No    Alcohol/week: 0.0 standard drinks of alcohol   Drug use: No   Sexual activity: Not Currently    Birth control/protection: Surgical  Other Topics Concern   Not on file  Social History Narrative   Lives alone   Right handed   Caffeine: maybe 1 cup of tea if she goes out to dinner. "I don't really do caffeine".   1 daughter, 1 grand daughter and 1 grandson.   Social Determinants of Health   Financial Resource Strain: Low Risk  (05/22/2022)   Overall Financial Resource Strain (CARDIA)    Difficulty of Paying Living Expenses: Not hard at all  Food Insecurity: No Food Insecurity (05/22/2022)   Hunger Vital Sign    Worried About Running Out of Food in the Last Year: Never true    Ran Out of Food in the Last Year: Never true  Transportation Needs: No Transportation Needs (05/22/2022)   PRAPARE - Administrator, Civil Service (Medical): No    Lack of Transportation (Non-Medical): No  Physical Activity: Insufficiently Active (05/22/2022)   Exercise Vital Sign    Days of Exercise per Week: 3 days    Minutes of Exercise per Session: 30 min  Stress: Stress Concern Present (05/22/2022)   [employer] - Occupational Stress Questionnaire    Feeling of Stress : To some extent  Social Connections: Moderately Integrated (05/22/2022)   Social Connection and Isolation Panel [NHANES]    Frequency of Communication with Friends and Family: More than three times a week    Frequency of Social Gatherings with Friends  and Family: More than three times a week    Attends Religious Services: More than 4 times per year    Active Member of Clubs or Organizations: Yes    Attends Engineer, structural: More than 4 times per year    Marital Status: Never married    Tobacco Counseling Counseling given: Not Answered   Clinical Intake:  Pre-visit preparation completed: Yes  Pain : No/denies pain     Diabetes: Yes CBG done?: No Did pt. bring in CBG monitor from home?: No  How often do you need to have someone help you when you read instructions, pamphlets, or other written materials from your doctor or pharmacy?: 1 - Never  Diabetic?Yes   Nutrition Risk Assessment:  Has the patient had any N/V/D within the last 2 months?  No  Does the patient have any non-healing wounds?  No  Has the patient had any unintentional weight loss or weight gain?  No   Diabetes:  Is the patient diabetic?  Yes  If diabetic, was a CBG obtained today?  No  Did the patient bring in their glucometer from home?  No  How often do you monitor your CBG's? Daily; patient reported as 108 today.   Financial Strains and Diabetes Management:  Are you having any financial strains with the device, your supplies or your medication? No .  Does the patient want to be seen by Chronic Care Management for management of their diabetes?  No  Would the patient like to be referred to a Nutritionist or for Diabetic Management?  No   Diabetic Exams:  Diabetic Eye Exam: Completed today; will request records  Diabetic Foot Exam: Completed 04/30/22   Interpreter Needed?: No  Information entered  by :: Kandis Fantasia LPN   Activities of Daily Living    05/22/2022   10:58 AM  In your present state of health, do you have any difficulty performing the following activities:  Hearing? 0  Vision? 0  Difficulty concentrating or making decisions? 0  Walking or climbing stairs? 1  Dressing or bathing? 0  Doing errands, shopping? 0  Preparing Food and eating ? N  Using the Toilet? N  In the past six months, have you accidently leaked urine? N  Do you have problems with loss of bowel control? N  Managing your Medications? N  Managing your Finances? N  Housekeeping or managing your Housekeeping? N    Patient Care Team: Babs Sciara, MD as PCP - General (Family Medicine) Jena Gauss Gerrit Friends, MD as Consulting Physician (Gastroenterology) Anson Fret, MD as Consulting Physician (Neurology) Dominica Severin, MD as Consulting Physician (Orthopedic Surgery)  Indicate any recent Medical Services you may have received from other than [facility] providers in the past year (date may be approximate).     Assessment:   This is a routine wellness examination for Remona.  Hearing/Vision screen Hearing Screening - Comments:: Denies hearing difficulties   Vision Screening - Comments:: up to date with routine eye exams with Dr. Harriette Bouillon    Dietary issues and exercise activities discussed: Current Exercise Habits: Home exercise routine, Type of exercise: walking, Time (Minutes): 30, Frequency (Times/Week): 3, Weekly Exercise (Minutes/Week): 90, Intensity: Mild   Goals Addressed   None    Depression Screen    05/22/2022   10:51 AM 04/30/2022    9:14 AM 01/05/2022    9:30 AM 12/22/2021   11:22 AM 05/13/2021    9:09 AM 03/10/2021    9:55 AM 10/15/2020  9:16 AM  PHQ 2/9 Scores  PHQ - 2 Score 4 3 4 4 2 5  0  PHQ- 9 Score 15 13 15 16 2 18      Fall Risk    05/22/2022   10:58 AM 05/13/2021    9:20 AM 10/08/2020    1:55 PM 08/18/2017   10:14 AM  Fall Risk   Falls in the past year? 0 0 0 No   Number falls in past yr: 0 0 0   Injury with Fall? 0 0 0   Risk for fall due to : No Fall Risks No Fall Risks No Fall Risks   Follow up Falls prevention discussed;Education provided;Falls evaluation completed Falls prevention discussed Falls evaluation completed     FALL RISK PREVENTION PERTAINING TO THE HOME:  Any stairs in or around the home? No  If so, are there any without handrails? No  Home free of loose throw rugs in walkways, pet beds, electrical cords, etc? Yes  Adequate lighting in your home to reduce risk of falls? Yes   ASSISTIVE DEVICES UTILIZED TO PREVENT FALLS:  Life alert? No  Use of a cane, walker or w/c? No  Grab bars in the bathroom? Yes  Shower chair or bench in shower? No  Elevated toilet seat or a handicapped toilet? Yes   TIMED UP AND GO:  Was the test performed? Yes .  Length of time to ambulate 10 feet: 7 sec.   Gait slow and steady without use of assistive device  Cognitive Function:        05/22/2022   10:58 AM 05/13/2021    9:26 AM  6CIT Screen  What Year? 0 points 0 points  What month? 0 points 0 points  What time? 0 points 0 points  Count back from 20 0 points 0 points  Months in reverse 0 points 0 points  Repeat phrase 0 points 2 points  Total Score 0 points 2 points    Immunizations Immunization History  Administered Date(s) Administered   Influenza Split 12/01/2012   Influenza,inj,Quad PF,6+ Mos 11/08/2013, 12/19/2014, 12/02/2015, 12/21/2017, 11/29/2018, 12/05/2019, 12/03/2020, 12/22/2021   Influenza-Unspecified 11/10/2011, 12/29/2018   PFIZER(Purple Top)SARS-COV-2 Vaccination 10/28/2019, 11/18/2019   Pneumococcal Polysaccharide-23 11/10/2010    TDAP status: Due, Education has been provided regarding the importance of this vaccine. Advised may receive this vaccine at local pharmacy or Health Dept. Aware to provide a copy of the vaccination record if obtained from local pharmacy or Health Dept. Verbalized acceptance and  understanding.  Flu Vaccine status: Up to date  Pneumococcal vaccine status: Up to date  Covid-19 vaccine status: Information provided on how to obtain vaccines.   Qualifies for Shingles Vaccine? Yes   Zostavax completed No   Shingrix Completed?: No.    Education has been provided regarding the importance of this vaccine. Patient has been advised to call insurance company to determine out of pocket expense if they have not yet received this vaccine. Advised may also receive vaccine at local pharmacy or Health Dept. Verbalized acceptance and understanding.  Screening Tests Health Maintenance  Topic Date Due   DTaP/Tdap/Td (1 - Tdap) Never done   Zoster Vaccines- Shingrix (1 of 2) Never done   OPHTHALMOLOGY EXAM  05/13/2022   INFLUENZA VACCINE  09/10/2022   HEMOGLOBIN A1C  11/04/2022   Diabetic kidney evaluation - Urine ACR  12/24/2022   FOOT EXAM  04/30/2023   Diabetic kidney evaluation - eGFR measurement  05/04/2023   Medicare Annual Wellness (AWV)  05/22/2023  MAMMOGRAM  04/06/2024   COLONOSCOPY (Pts 45-52yrs Insurance coverage will need to be confirmed)  04/07/2025   Hepatitis C Screening  Completed   HIV Screening  Completed   HPV VACCINES  Aged Out   COVID-19 Vaccine  Discontinued    Health Maintenance  Health Maintenance Due  Topic Date Due   DTaP/Tdap/Td (1 - Tdap) Never done   Zoster Vaccines- Shingrix (1 of 2) Never done   OPHTHALMOLOGY EXAM  05/13/2022    Colorectal cancer screening: Type of screening: Colonoscopy. Completed 04/08/15. Repeat every 10 years  Mammogram status: Completed 04/06/22. Repeat every year  Lung Cancer Screening: (Low Dose CT Chest recommended if Age 21-80 years, 30 pack-year currently smoking OR have quit w/in 15years.) does not qualify.   Lung Cancer Screening Referral: n/a  Additional Screening:  Hepatitis C Screening: does qualify; Completed 05/02/15  Vision Screening: Recommended annual ophthalmology exams for early detection of  glaucoma and other disorders of the eye. Is the patient up to date with their annual eye exam?  Yes  Who is the provider or what is the name of the office in which the patient attends annual eye exams? [facility] If pt is not established with a provider, would they like to be referred to a provider to establish care? No .   Dental Screening: Recommended annual dental exams for proper oral hygiene  Community Resource Referral / Chronic Care Management: CRR required this visit?  No   CCM required this visit?  No      Plan:     I have personally reviewed and noted the following in the patient's chart:   Medical and social history Use of alcohol, tobacco or illicit drugs  Current medications and supplements including opioid prescriptions. Patient is not currently taking opioid prescriptions. Functional ability and status Nutritional status Physical activity Advanced directives List of other physicians Hospitalizations, surgeries, and ER visits in previous 12 months Vitals Screenings to include cognitive, depression, and falls Referrals and appointments  In addition, I have reviewed and discussed with patient certain preventive protocols, quality metrics, and best practice recommendations. A written personalized care plan for preventive services as well as general preventive health recommendations were provided to patient.     Kandis Fantasia [place], [place]   1/61/0960   Nurse Notes: Patient is asking about rx for Ozempic to replace Victoza; states that it was discussed in last office visit.

## 2022-05-23 NOTE — Telephone Encounter (Signed)
Nurses-may have prescription for Ozempic 0.25 once weekly for the first 4 weeks, 2 refills then after that most likely go up to 0.5 Please have the patient give [place] update within 3 weeks of starting Ozempic she should be able to tolerate it because it is extremely similar to Victoza except it is once a week

## 2022-05-29 ENCOUNTER — Other Ambulatory Visit: Payer: Self-pay

## 2022-05-29 MED ORDER — SEMAGLUTIDE(0.25 OR 0.5MG/DOS) 2 MG/3ML ~~LOC~~ SOPN: PEN_INJECTOR | SUBCUTANEOUS | 2 refills | Status: DC

## 2022-05-29 NOTE — Telephone Encounter (Signed)
Called and informed patient per drs orders and recommendations.

## 2022-06-01 ENCOUNTER — Telehealth: Payer: Self-pay | Admitting: Family Medicine

## 2022-06-01 NOTE — Telephone Encounter (Signed)
FYI- patient was told to call back when she started her Ozempic  0.25. She started this morning.

## 2022-06-01 NOTE — Telephone Encounter (Signed)
Glad to hear it Hopefully she will tolerate it well She will give [place] feedback Please have her give [place] feedback 3 to 4 weeks how she is tolerating the medicine At that point we may be going up on the dose to 0.5 mg

## 2022-06-02 ENCOUNTER — Encounter: Payer: Self-pay | Admitting: *Deleted

## 2022-06-02 NOTE — Telephone Encounter (Signed)
See my chart message: Provider recommendations sent to patient via my chart message
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
## 2022-06-30 ENCOUNTER — Telehealth: Payer: Self-pay

## 2022-06-30 NOTE — Telephone Encounter (Signed)
So with Ozempic we recommend every 4 weeks increasing the dose as long as she tolerates it There is still issues with manufacturing to where shortages do exist If she is tolerating 0.5 we would recommend going up to 1.0 mg once a week, 1 month supply with 2 refills Patient should give [place] feedback within 3 to 4 weeks how that is doing If severe nausea vomiting may need to go back to the lower dose

## 2022-06-30 NOTE — Telephone Encounter (Signed)
Pt is on her 5th week for Ozempic does Dr Lorin Picket want to call another med in or doe something different if they have a 90 day supply if she gets switch. She is currently taking 0.5mg  5th week?   [facility] in [place] [phone]

## 2022-07-01 ENCOUNTER — Other Ambulatory Visit: Payer: Self-pay

## 2022-07-01 DIAGNOSIS — E119 Type 2 diabetes mellitus without complications: Secondary | ICD-10-CM

## 2022-07-01 MED ORDER — SEMAGLUTIDE (1 MG/DOSE) 4 MG/3ML ~~LOC~~ SOPN: 1.0000 mg | PEN_INJECTOR | SUBCUTANEOUS | 2 refills | Status: DC

## 2022-07-01 NOTE — Telephone Encounter (Signed)
Spoke with patient, she is tolerating 0.5 ozempic with no trouble and would like to have 1 mg ozempic sent in .

## 2022-07-08 ENCOUNTER — Telehealth: Payer: Self-pay

## 2022-07-08 DIAGNOSIS — E119 Type 2 diabetes mellitus without complications: Secondary | ICD-10-CM

## 2022-07-08 NOTE — Telephone Encounter (Signed)
the rash started with semaglutide 0.5 mg 5 days ago, it is red and burning- similar to a poison ivy type rash, located on her arms , legs, and neck. Low abdominal ache similar to constipation ache, no nausea, diarrhea, or vomiting. going on now for 5 days and starting to subside. She did take benadryl but did not help much. She states would like stop taking semaglutide and she has told the pharmacy to cancel the 1 mg dose.

## 2022-07-08 NOTE — Telephone Encounter (Signed)
Left a message for a return call for details ?

## 2022-07-08 NOTE — Telephone Encounter (Signed)
Always difficult to know if this is due to medication or due to something else several questions Where was the rash?  Hives?  Itching or burning? Hold off on taking this until we know more answers to the questions

## 2022-07-08 NOTE — Telephone Encounter (Signed)
   Allergic Reaction  Pt ws taking Semaglutide,0.25 or 0.5MG /DOS, 2 MG/3ML SOPN  when she went up on the dosage she started breaking out like a rash and its burning not itching she is not taking anymore and she wants a nurse to call her on what to do next.  Call back Mikali [phone]

## 2022-07-10 ENCOUNTER — Encounter: Payer: Self-pay | Admitting: Family Medicine

## 2022-07-10 ENCOUNTER — Other Ambulatory Visit: Payer: Self-pay | Admitting: Family Medicine

## 2022-07-10 ENCOUNTER — Encounter: Payer: Medicare HMO | Admitting: Rheumatology

## 2022-07-10 DIAGNOSIS — G44019 Episodic cluster headache, not intractable: Secondary | ICD-10-CM

## 2022-07-10 DIAGNOSIS — M47816 Spondylosis without myelopathy or radiculopathy, lumbar region: Secondary | ICD-10-CM

## 2022-07-10 DIAGNOSIS — M791 Myalgia, unspecified site: Secondary | ICD-10-CM

## 2022-07-10 DIAGNOSIS — G4719 Other hypersomnia: Secondary | ICD-10-CM

## 2022-07-10 DIAGNOSIS — Z8669 Personal history of other diseases of the nervous system and sense organs: Secondary | ICD-10-CM

## 2022-07-10 DIAGNOSIS — G4709 Other insomnia: Secondary | ICD-10-CM

## 2022-07-10 DIAGNOSIS — G8929 Other chronic pain: Secondary | ICD-10-CM

## 2022-07-10 DIAGNOSIS — F339 Major depressive disorder, recurrent, unspecified: Secondary | ICD-10-CM

## 2022-07-10 DIAGNOSIS — L732 Hidradenitis suppurativa: Secondary | ICD-10-CM

## 2022-07-10 DIAGNOSIS — E1169 Type 2 diabetes mellitus with other specified complication: Secondary | ICD-10-CM

## 2022-07-10 DIAGNOSIS — K219 Gastro-esophageal reflux disease without esophagitis: Secondary | ICD-10-CM

## 2022-07-10 MED ORDER — GLIPIZIDE ER 2.5 MG PO TB24: ORAL_TABLET | ORAL | 4 refills | Status: DC

## 2022-07-10 NOTE — Telephone Encounter (Signed)
Called and spoke with patient advised per Dr Lorin Picket it is difficult in this situation Stop Ozempic We put that on her medicines to avoid in the future There are some options.  She could use glipizide 2.5 mg XL, #30, 1 daily with 4 refills.  She can periodically check her sugars and send [place] some readings.  If this does not adequately control things there are other options we can go to.  She has a follow-up visit in August it would be wise for her to do A1c, metabolic 7 before that visit if not already ordered She can send [place] updates in 2 to 3 weeks how this medication is working for her Patient verbalized understanding. Labs placed in EPIC and RX sent to pharmacy. Patient instructed to send blood sugars via MyChart.

## 2022-07-10 NOTE — Telephone Encounter (Signed)
It is difficult in this situation Stop Ozempic We put that on her medicines to avoid in the future There are some options.  She could use glipizide 2.5 mg XL, #30, 1 daily with 4 refills.  She can periodically check her sugars and send [place] some readings.  If this does not adequately control things there are other options we can go to.  She has a follow-up visit in August it would be wise for her to do A1c, metabolic 7 before that visit if not already ordered She can send [place] updates in 2 to 3 weeks how this medication is working for her

## 2022-07-10 NOTE — Telephone Encounter (Signed)
See telephone message

## 2022-07-31 ENCOUNTER — Ambulatory Visit: Payer: Medicare HMO | Admitting: Rheumatology

## 2022-08-11 ENCOUNTER — Ambulatory Visit: Payer: Medicare HMO | Admitting: Rheumatology

## 2022-08-19 ENCOUNTER — Other Ambulatory Visit: Payer: Self-pay | Admitting: Family Medicine

## 2022-09-02 ENCOUNTER — Encounter: Payer: Medicare HMO | Admitting: Family Medicine

## 2022-09-02 ENCOUNTER — Ambulatory Visit: Payer: Medicare HMO | Admitting: Family Medicine

## 2022-09-02 DIAGNOSIS — E119 Type 2 diabetes mellitus without complications: Secondary | ICD-10-CM

## 2022-09-02 NOTE — Progress Notes (Unsigned)
   Subjective:    Patient ID: [name], female    DOB: [birth year], 57 y.o.   MRN: [id number]  HPI  [name] arrived 09/02/2022 and has given verbal consent to obtain images and complete their overdue diabetic retinal screening.  The images have been sent to an ophthalmologist or optometrist for review and interpretation.  Results will be sent back to Babs Sciara, MD for review.  Patient has been informed they will be contacted when we receive the results via telephone or MyChart   image numbers:   [id number]    Review of Systems     Objective:   Physical Exam        Assessment & Plan:

## 2022-09-02 NOTE — Progress Notes (Signed)
She was seen by the nurse for diabetic eye exam

## 2022-09-02 NOTE — Progress Notes (Deleted)
[name] arrived 09/02/2022 and has given verbal consent to obtain images and complete their overdue diabetic retinal screening.  The images have been sent to an ophthalmologist or optometrist for review and interpretation.  Results will be sent back to Babs Sciara, MD for review.  Patient has been informed they will be contacted when we receive the results via telephone or MyChart  image numbers:  [id number]

## 2022-09-03 NOTE — Addendum Note (Signed)
Addended by: Margaretha Sheffield on: 09/03/2022 10:45 AM   Modules accepted: Orders

## 2022-09-03 NOTE — Progress Notes (Signed)
   Subjective:    Patient ID: [name], female    DOB: 03/25/1965, 57 y.o.   MRN: [id number]  HPI [name] arrived 09/02/22 and has given verbal consent to obtain images and complete their overdue diabetic retinal screening.  The images have been sent to an ophthalmologist or optometrist for review and interpretation.  Results will be sent back to Babs Sciara, MD for review.  Patient has been informed they will be contacted when we receive the results via telephone or MyChart      Review of Systems     Objective:   Physical Exam        Assessment & Plan:

## 2022-09-25 ENCOUNTER — Ambulatory Visit: Payer: Medicare HMO | Admitting: Nurse Practitioner

## 2022-09-25 VITALS — BP 120/76 | HR 68 | Wt 199.8 lb

## 2022-09-25 DIAGNOSIS — G43719 Chronic migraine without aura, intractable, without status migrainosus: Secondary | ICD-10-CM | POA: Diagnosis not present

## 2022-09-25 DIAGNOSIS — E1169 Type 2 diabetes mellitus with other specified complication: Secondary | ICD-10-CM

## 2022-09-25 DIAGNOSIS — E119 Type 2 diabetes mellitus without complications: Secondary | ICD-10-CM

## 2022-09-25 DIAGNOSIS — I10 Essential (primary) hypertension: Secondary | ICD-10-CM | POA: Diagnosis not present

## 2022-09-25 DIAGNOSIS — E785 Hyperlipidemia, unspecified: Secondary | ICD-10-CM | POA: Diagnosis not present

## 2022-09-25 DIAGNOSIS — Z113 Encounter for screening for infections with a predominantly sexual mode of transmission: Secondary | ICD-10-CM | POA: Diagnosis not present

## 2022-09-25 DIAGNOSIS — M79672 Pain in left foot: Secondary | ICD-10-CM

## 2022-09-25 DIAGNOSIS — Z0000 Encounter for general adult medical examination without abnormal findings: Secondary | ICD-10-CM

## 2022-09-25 MED ORDER — TOPIRAMATE 50 MG PO TABS: ORAL_TABLET | ORAL | 0 refills | Status: DC

## 2022-09-25 MED ORDER — ONDANSETRON 4 MG PO TBDP: 4.0000 mg | ORAL_TABLET | Freq: Three times a day (TID) | ORAL | 0 refills | Status: DC | PRN

## 2022-09-25 NOTE — Progress Notes (Unsigned)
Subjective:    Patient ID: [name], female    DOB: 09-06-1965, 57 y.o.   MRN: [id number]  HPI Patient arrives today with migraines. Patient states she has had a migraine everyday for the last 3 weeks. Patient takes extra strength tylenol with no-relief. Patient states she needs quiet and has nausea and vomiting.  History of migraines but they have been stable up until 3 weeks ago.  No change in her symptomatology.  Describes a generalized almost constant pain with throbbing.  Mild blurred vision.  Occasional nausea and vomiting.  Mild dizziness but no syncope.  Had a normal eye exam earlier this year.  Photosensitivity.  Phonophobia.  Occasional spots in her vision but no loss of vision.  Better with ice packs to her neck and head area, rest in a quiet dark room.  Headaches will sometimes wake her up at nighttime.  Had a workup with neurology including Botox injections, last seen 09/03/2020. Also complaints of chronic left foot pain, requesting referral to podiatry. Has had unprotected sex and requesting STD screening.  Denies any specific symptoms.  Review of Systems  Constitutional:  Positive for fatigue. Negative for fever.  HENT:  Negative for sore throat and trouble swallowing.   Respiratory:  Negative for cough, chest tightness and shortness of breath.   Cardiovascular:  Negative for chest pain and leg swelling.  Gastrointestinal:  Positive for nausea and vomiting.  Genitourinary:  Negative for genital sores, pelvic pain and vaginal discharge.  Neurological:  Positive for dizziness and headaches. Negative for syncope, facial asymmetry, weakness and numbness.       Objective:   Physical Exam NAD.  Alert, oriented.  Thyroid nontender to palpation, no mass or goiter noted.  Lungs clear.  Heart regular rate rhythm.  Speech clear.  No facial asymmetry noted.  Gait normal limit.  Left foot skin intact.  Tenderness to palpation along the lateral edge of the foot.  Strong DP  pulse. Today's Vitals   09/25/22 1535  BP: 120/76  Pulse: 68  SpO2: 100%  Weight: 199 lb 12.8 oz (90.6 kg)   Body mass index is 33.25 kg/m.        Assessment & Plan:   Problem List Items Addressed This Visit       Cardiovascular and Mediastinum   Migraine - Primary   Relevant Medications   topiramate (TOPAMAX) 50 MG tablet   Other Relevant Orders   TSH + free T4   Ambulatory referral to Neurology     Endocrine   Hyperlipidemia associated with type 2 diabetes mellitus (HCC)   Other Visit Diagnoses     Pain in left foot       Relevant Orders   Ambulatory referral to Podiatry   Diabetes mellitus without complication (HCC)       Relevant Orders   Hemoglobin A1c   Primary hypertension       Relevant Orders   Basic Metabolic Panel   Screening for STD (sexually transmitted disease)       Relevant Orders   HIV antibody (with reflex)   RPR   Chlamydia/Gonococcus/Trichomonas, NAA      Referred to podiatry for evaluation of foot pain. Referred back to neurologist for evaluation of migraines.  In the meantime we will start on topiramate 50 mg half tab p.o. each evening for about 6 days then 1 p.o. daily. Patient will also check to see if her insurance will cover any CGRP medications for her headaches. Warning signs  reviewed regarding headaches, call back if any new or worsening symptoms. Meds ordered this encounter  Medications   topiramate (TOPAMAX) 50 MG tablet    Sig: Take one tablet po at bedtime for migraines    Dispense:  30 tablet    Refill:  0    Order Specific Question:   Supervising Provider    Answer:   Lilyan Punt A [[id number]]   ondansetron (ZOFRAN-ODT) 4 MG disintegrating tablet    Sig: Take 1 tablet (4 mg total) by mouth every 8 (eight) hours as needed for nausea.    Dispense:  20 tablet    Refill:  0    Order Specific Question:   Supervising Provider    Answer:   Lilyan Punt A [[id number]]   STD screening and other labs pending.

## 2022-09-25 NOTE — Patient Instructions (Addendum)
Emgality or amovig Nurtec or Ubrelvy Need CGRP preferred medication list from insurance for migraines   Topiramate start with 1/2 tab each evening for about 6 days then one tab daily

## 2022-09-26 ENCOUNTER — Encounter: Payer: Self-pay | Admitting: Nurse Practitioner

## 2022-09-29 DIAGNOSIS — I10 Essential (primary) hypertension: Secondary | ICD-10-CM | POA: Diagnosis not present

## 2022-09-29 DIAGNOSIS — Z113 Encounter for screening for infections with a predominantly sexual mode of transmission: Secondary | ICD-10-CM | POA: Diagnosis not present

## 2022-09-29 DIAGNOSIS — G43719 Chronic migraine without aura, intractable, without status migrainosus: Secondary | ICD-10-CM | POA: Diagnosis not present

## 2022-09-29 DIAGNOSIS — E119 Type 2 diabetes mellitus without complications: Secondary | ICD-10-CM | POA: Diagnosis not present

## 2022-09-29 LAB — CHLAMYDIA/GONOCOCCUS/TRICHOMONAS, NAA
Chlamydia by NAA: NEGATIVE
Gonococcus by NAA: NEGATIVE
Trich vag by NAA: NEGATIVE

## 2022-09-29 NOTE — Progress Notes (Signed)
This encounter was created in error - please disregard.

## 2022-09-30 LAB — HEMOGLOBIN A1C
Est. average glucose Bld gHb Est-mCnc: 137 mg/dL
Hgb A1c MFr Bld: 6.4 % — ABNORMAL HIGH (ref 4.8–5.6)

## 2022-09-30 LAB — BASIC METABOLIC PANEL
BUN/Creatinine Ratio: 17 (ref 9–23)
BUN: 13 mg/dL (ref 6–24)
CO2: 24 mmol/L (ref 20–29)
Calcium: 10.3 mg/dL — ABNORMAL HIGH (ref 8.7–10.2)
Chloride: 102 mmol/L (ref 96–106)
Creatinine, Ser: 0.78 mg/dL (ref 0.57–1.00)
Glucose: 115 mg/dL — ABNORMAL HIGH (ref 70–99)
Potassium: 4.9 mmol/L (ref 3.5–5.2)
Sodium: 141 mmol/L (ref 134–144)
eGFR: 88 mL/min/{1.73_m2} (ref >59)

## 2022-09-30 LAB — TSH+FREE T4
Free T4: 1.4 ng/dL (ref 0.82–1.77)
TSH: 0.366 u[IU]/mL — ABNORMAL LOW (ref 0.450–4.500)

## 2022-09-30 LAB — HIV ANTIBODY (ROUTINE TESTING W REFLEX): HIV Screen 4th Generation wRfx: NONREACTIVE

## 2022-09-30 LAB — RPR: RPR Ser Ql: NONREACTIVE

## 2022-10-01 ENCOUNTER — Ambulatory Visit: Payer: Medicare HMO | Admitting: Family Medicine

## 2022-10-06 ENCOUNTER — Encounter: Payer: Self-pay | Admitting: Family Medicine

## 2022-10-06 ENCOUNTER — Ambulatory Visit (INDEPENDENT_AMBULATORY_CARE_PROVIDER_SITE_OTHER): Payer: Medicare HMO | Admitting: Family Medicine

## 2022-10-06 VITALS — BP 124/78 | HR 62 | Temp 97.9°F | Ht 65.0 in | Wt 199.0 lb

## 2022-10-06 DIAGNOSIS — E785 Hyperlipidemia, unspecified: Secondary | ICD-10-CM

## 2022-10-06 DIAGNOSIS — R200 Anesthesia of skin: Secondary | ICD-10-CM | POA: Diagnosis not present

## 2022-10-06 DIAGNOSIS — I10 Essential (primary) hypertension: Secondary | ICD-10-CM

## 2022-10-06 DIAGNOSIS — E119 Type 2 diabetes mellitus without complications: Secondary | ICD-10-CM | POA: Diagnosis not present

## 2022-10-06 DIAGNOSIS — R7989 Other specified abnormal findings of blood chemistry: Secondary | ICD-10-CM | POA: Diagnosis not present

## 2022-10-06 DIAGNOSIS — E1169 Type 2 diabetes mellitus with other specified complication: Secondary | ICD-10-CM

## 2022-10-06 DIAGNOSIS — G43719 Chronic migraine without aura, intractable, without status migrainosus: Secondary | ICD-10-CM

## 2022-10-06 MED ORDER — SERTRALINE HCL 100 MG PO TABS: ORAL_TABLET | ORAL | 1 refills | Status: DC

## 2022-10-06 MED ORDER — ROSUVASTATIN CALCIUM 5 MG PO TABS
5.0000 mg | ORAL_TABLET | Freq: Every day | ORAL | 1 refills | Status: DC
Start: 2022-10-06 — End: 2022-11-02

## 2022-10-06 MED ORDER — METOPROLOL SUCCINATE ER 50 MG PO TB24: 50.0000 mg | ORAL_TABLET | Freq: Every day | ORAL | 0 refills | Status: DC

## 2022-10-06 MED ORDER — TOPIRAMATE 50 MG PO TABS: ORAL_TABLET | ORAL | 1 refills | Status: DC

## 2022-10-06 MED ORDER — PANTOPRAZOLE SODIUM 40 MG PO TBEC: 40.0000 mg | DELAYED_RELEASE_TABLET | Freq: Every day | ORAL | 0 refills | Status: DC

## 2022-10-06 MED ORDER — GLIPIZIDE ER 2.5 MG PO TB24: ORAL_TABLET | ORAL | 4 refills | Status: DC

## 2022-10-06 MED ORDER — AMLODIPINE BESYLATE 5 MG PO TABS: 5.0000 mg | ORAL_TABLET | Freq: Every day | ORAL | 1 refills | Status: DC

## 2022-10-06 MED ORDER — GABAPENTIN 100 MG PO CAPS: 100.0000 mg | ORAL_CAPSULE | Freq: Every day | ORAL | 1 refills | Status: DC

## 2022-10-06 NOTE — Progress Notes (Unsigned)
Subjective:    Patient ID: [name], female    DOB: 1965/12/29, 57 y.o.   MRN: [id number]  HPI Diabetes mellitus without complication (HCC)  Low TSH level  Primary hypertension  Intractable chronic migraine without aura and without status migrainosus  Hand numbness  Hypercalcemia  Hyperlipidemia associated with type 2 diabetes mellitus (HCC) - Plan: rosuvastatin (CRESTOR) 5 MG tablet  She relates her headaches are doing slightly better with the Topamax but she would like to see neurology she is requesting a different referral because the other clinic told her she had a outstanding balance with 2014 which she does not recall  Patient has blood pressure issues takes medicine on a regular basis today's readings look good tries to minimize salt  Her recent thyroid testing shows a low TSH level that needs further looking into Her recent lab work shows hypercalcemia that needs to be looked into further She also describes over the past several months some intermittent numbness tingling in the hands and feet mainly in the tips of her fingers and her toes  She does have diabetes she is trying do a good job of healthy diet and utilizing her medication  Moderate obesity she is trying to do portion control and activity Results for orders placed or performed in visit on 09/25/22  Chlamydia/Gonococcus/Trichomonas, NAA  Result Value Ref Range   Chlamydia by NAA Negative Negative   Gonococcus by NAA Negative Negative   Trich vag by NAA Negative Negative  Basic Metabolic Panel  Result Value Ref Range   Glucose 115 (H) 70 - 99 mg/dL   BUN 13 6 - 24 mg/dL   Creatinine, Ser 7.84 0.57 - 1.00 mg/dL   eGFR 88 >69 GE/XBM/8.41   BUN/Creatinine Ratio 17 9 - 23   Sodium 141 134 - 144 mmol/L   Potassium 4.9 3.5 - 5.2 mmol/L   Chloride 102 96 - 106 mmol/L   CO2 24 20 - 29 mmol/L   Calcium 10.3 (H) 8.7 - 10.2 mg/dL  Hemoglobin L2G  Result Value Ref Range   Hgb A1c MFr Bld 6.4 (H) 4.8  - 5.6 %   Est. average glucose Bld gHb Est-mCnc 137 mg/dL  TSH + free T4  Result Value Ref Range   TSH 0.366 (L) 0.450 - 4.500 uIU/mL   Free T4 1.40 0.82 - 1.77 ng/dL  HIV antibody (with reflex)  Result Value Ref Range   HIV Screen 4th Generation wRfx Non Reactive Non Reactive  RPR  Result Value Ref Range   RPR Ser Ql Non Reactive Non Reactive     Review of Systems     Objective:   Physical Exam  General-in no acute distress Eyes-no discharge Lungs-respiratory rate normal, CTA CV-no murmurs,RRR Extremities skin warm dry no edema Neuro grossly normal Behavior normal, alert  Diabetic foot exam completed     Assessment & Plan:  1. Diabetes mellitus without complication (HCC) Under decent control monitor every 6 months healthy diet continue medications  2. Low TSH level Will look into this further with additional lab work in a few weeks time to verify this  3. Primary hypertension Blood pressure good control continue current medication watch diet  4. Intractable chronic migraine without aura and without status migrainosus Topamax is helping referral to neurology  5. Hand numbness Numbness in the tips of her fingers and her toes at this point in time would not recommend nerve conduction studies we will check B12 level  6. Hypercalcemia Will check  parathyroid as well as vitamin D await results  7. Hyperlipidemia associated with type 2 diabetes mellitus (HCC) Previous Levels cholesterol look good continue statin healthy diet - rosuvastatin (CRESTOR) 5 MG tablet; Take 1 tablet (5 mg total) by mouth daily. for cholesterol.  Dispense: 90 tablet; Refill: 1  Follow-up as planned in 1 month to recheck migraines

## 2022-10-08 ENCOUNTER — Other Ambulatory Visit: Payer: Self-pay

## 2022-10-08 DIAGNOSIS — R7989 Other specified abnormal findings of blood chemistry: Secondary | ICD-10-CM

## 2022-10-08 DIAGNOSIS — G629 Polyneuropathy, unspecified: Secondary | ICD-10-CM

## 2022-10-08 DIAGNOSIS — E538 Deficiency of other specified B group vitamins: Secondary | ICD-10-CM

## 2022-10-14 ENCOUNTER — Ambulatory Visit: Payer: Medicare HMO | Admitting: Podiatry

## 2022-10-14 ENCOUNTER — Encounter: Payer: Self-pay | Admitting: Podiatry

## 2022-10-14 ENCOUNTER — Other Ambulatory Visit: Payer: Self-pay

## 2022-10-14 ENCOUNTER — Ambulatory Visit (INDEPENDENT_AMBULATORY_CARE_PROVIDER_SITE_OTHER): Payer: Medicare HMO

## 2022-10-14 ENCOUNTER — Telehealth: Payer: Self-pay

## 2022-10-14 DIAGNOSIS — M21621 Bunionette of right foot: Secondary | ICD-10-CM

## 2022-10-14 DIAGNOSIS — M79672 Pain in left foot: Secondary | ICD-10-CM

## 2022-10-14 DIAGNOSIS — G43719 Chronic migraine without aura, intractable, without status migrainosus: Secondary | ICD-10-CM

## 2022-10-14 DIAGNOSIS — M79671 Pain in right foot: Secondary | ICD-10-CM

## 2022-10-14 DIAGNOSIS — M7752 Other enthesopathy of left foot: Secondary | ICD-10-CM | POA: Diagnosis not present

## 2022-10-14 DIAGNOSIS — M722 Plantar fascial fibromatosis: Secondary | ICD-10-CM | POA: Diagnosis not present

## 2022-10-14 DIAGNOSIS — M21622 Bunionette of left foot: Secondary | ICD-10-CM | POA: Diagnosis not present

## 2022-10-14 MED ORDER — TRIAMCINOLONE ACETONIDE 10 MG/ML IJ SUSP
10.0000 mg | Freq: Once | INTRAMUSCULAR | Status: AC
Start: 2022-10-14 — End: 2022-10-14
  Administered 2022-10-14: 10 mg via INTRA_ARTICULAR

## 2022-10-14 NOTE — Telephone Encounter (Signed)
Referral has been placed to [facility] neurology.

## 2022-10-15 NOTE — Progress Notes (Signed)
Subjective:   Patient ID: [name], female   DOB: 57 y.o.   MRN: [id number]   HPI Patient presents with caregiver with a lot of pain in the left over right foot with the heel being very tender and the outside of the foot on both feet left over right being very tender.  States this has been going on for a while and she has tried different shoe gear and soaks without relief.  Patient does not smoke likes to be active   Review of Systems  All other systems reviewed and are negative.       Objective:  Physical Exam Vitals and nursing note reviewed.  Constitutional:      Appearance: She is well-developed.  Pulmonary:     Effort: Pulmonary effort is normal.  Musculoskeletal:        General: Normal range of motion.  Skin:    General: Skin is warm.  Neurological:     Mental Status: She is alert.     Neurovascular status intact muscle strength adequate range of motion within normal limits with the patient noted to have exquisite discomfort in the medial fascial band left at the insertion calcaneus and quite a bit of inflammation around the fifth MPJ left over right fluid buildup enlargement of the joint surface with discomfort also at the base of the fifth metatarsal right.  Patient is found to have good digital perfusion well-oriented x 3     Assessment:  Acute inflammation with probable structural damage of the fifth MPJ bilateral with left over right foot currently      Plan:  H&P reviewed condition with patient.  I have recommended focusing on the left foot first and I went ahead today I did sterile prep I injected the left plantar fascia at insertion 3 mg Kenalog 5 mg Xylocaine and injected the capsule of the fifth MPJ 3 mg dexamethasone Kenalog 5 mg Xylocaine.  I then applied fascial brace to lift up the arch properly fitted into the arch with instructions on using and I advised on good support shoe gear.  Patient will be seen back to recheck  X-rays indicate elevation  of the 4 5 intermetatarsal angle bilateral spur formation plantar heel no indication stress fracture arthritis

## 2022-10-30 ENCOUNTER — Ambulatory Visit: Payer: Medicare HMO | Admitting: Nurse Practitioner

## 2022-10-30 DIAGNOSIS — E538 Deficiency of other specified B group vitamins: Secondary | ICD-10-CM | POA: Diagnosis not present

## 2022-10-30 DIAGNOSIS — R7989 Other specified abnormal findings of blood chemistry: Secondary | ICD-10-CM | POA: Diagnosis not present

## 2022-10-30 DIAGNOSIS — G629 Polyneuropathy, unspecified: Secondary | ICD-10-CM | POA: Diagnosis not present

## 2022-10-31 LAB — VITAMIN D 25 HYDROXY (VIT D DEFICIENCY, FRACTURES): Vit D, 25-Hydroxy: 28.2 ng/mL — ABNORMAL LOW (ref 30.0–100.0)

## 2022-10-31 LAB — TSH+FREE T4
Free T4: 1.36 ng/dL (ref 0.82–1.77)
TSH: 0.552 u[IU]/mL (ref 0.450–4.500)

## 2022-10-31 LAB — VITAMIN B12: Vitamin B-12: 510 pg/mL (ref 232–1245)

## 2022-10-31 LAB — PARATHYROID HORMONE, INTACT (NO CA): PTH: 25 pg/mL (ref 15–65)

## 2022-10-31 LAB — T3: T3, Total: 101 ng/dL (ref 71–180)

## 2022-11-02 ENCOUNTER — Ambulatory Visit (INDEPENDENT_AMBULATORY_CARE_PROVIDER_SITE_OTHER): Payer: Medicare HMO | Admitting: Family Medicine

## 2022-11-02 VITALS — BP 136/86 | HR 60 | Temp 97.2°F | Ht 65.0 in | Wt 201.0 lb

## 2022-11-02 DIAGNOSIS — M5431 Sciatica, right side: Secondary | ICD-10-CM

## 2022-11-02 DIAGNOSIS — G43719 Chronic migraine without aura, intractable, without status migrainosus: Secondary | ICD-10-CM

## 2022-11-02 DIAGNOSIS — I10 Essential (primary) hypertension: Secondary | ICD-10-CM | POA: Diagnosis not present

## 2022-11-02 DIAGNOSIS — R413 Other amnesia: Secondary | ICD-10-CM | POA: Diagnosis not present

## 2022-11-02 DIAGNOSIS — E119 Type 2 diabetes mellitus without complications: Secondary | ICD-10-CM

## 2022-11-02 DIAGNOSIS — F09 Unspecified mental disorder due to known physiological condition: Secondary | ICD-10-CM

## 2022-11-02 MED ORDER — ATORVASTATIN CALCIUM 10 MG PO TABS
ORAL_TABLET | ORAL | 3 refills | Status: DC
Start: 2022-11-02 — End: 2023-08-06

## 2022-11-02 NOTE — Progress Notes (Signed)
Subjective:    Patient ID: [name], female    DOB: 1965-06-03, 57 y.o.   MRN: [id number]  HPI  Patient under a lot of stress She denies any major setbacks Her blood pressure is elevated compared to where it normally is She tries to eat relatively healthy She does have frequent headaches We are trying to get her in with neurology but that has been difficult  She also relates she has had a couple spells where she is totally forgotten what she should remember sometimes in a conversation sometimes she relates that she cannot remember how to get to a certain place she states that this is really disturbing her this is new for her over the past few weeks  She is also hesitant about statin stating that she has had family members who had problems with rosuvastatin and she wants get away from that trial of different statin    Review of Systems     Objective:   Physical Exam  General-in no acute distress Eyes-no discharge Lungs-respiratory rate normal, CTA CV-no murmurs,RRR Extremities skin warm dry no edema Neuro grossly normal Behavior normal, alert  She is able to relate well today no apparent cognitive issues currently We did review over her medication list in detail       Assessment & Plan:  1. Intractable chronic migraine without aura and without status migrainosus We are trying to get her in with specialists for this.  So far 1 particular place did not want to take her in because of outstanding previous stability We are trying to get her in an alternative location  Recently we started topiramate which seems to be helping I am not commence metoprolol is of benefit for her with her recent cognitive issues and short-term memory we will discontinue metoprolol because of the potential negative cognitive effects - MR Brain W Wo Contrast  2. Primary hypertension Blood pressure on recheck are okay healthy diet walking minimize salt  3. Cognitive  dysfunction Cognitive dysfunction need to do MRI to rule out mini strokes in addition to this if this progresses may need further neurocognitive testing  It is also possible that gabapentin could be causing cognitive effects so therefore stop gabapentin.  4. Short-term memory loss For now it is safe for her to operate a motor vehicle.  I do not find evidence of dementia  5. Sciatica, right side She has been doing well lately with no sciatica and therefore stopped the gabapentin  6. Diabetes mellitus without complication (HCC) No low sugar spells may continue current medication  Patient to follow-up in approximately 6 weeks for recheck sooner problems Patient tearful about this try to give some reassurance but at the same time we need to look into this

## 2022-11-04 ENCOUNTER — Encounter: Payer: Self-pay | Admitting: Podiatry

## 2022-11-04 ENCOUNTER — Ambulatory Visit: Payer: Medicare HMO | Admitting: Podiatry

## 2022-11-04 VITALS — BP 165/89 | HR 60

## 2022-11-04 DIAGNOSIS — M722 Plantar fascial fibromatosis: Secondary | ICD-10-CM | POA: Diagnosis not present

## 2022-11-04 DIAGNOSIS — M21621 Bunionette of right foot: Secondary | ICD-10-CM

## 2022-11-04 DIAGNOSIS — M7751 Other enthesopathy of right foot: Secondary | ICD-10-CM | POA: Diagnosis not present

## 2022-11-04 NOTE — Progress Notes (Signed)
Subjective:   Patient ID: [name], female   DOB: 57 y.o.   MRN: [id number]   HPI Patient states doing very well with the left foot having pain in the right heel and outside of the foot that she would like treated   ROS      Objective:  Physical Exam  Neuro vas status intact with the left heel and fifth metatarsal doing much better with significant reduction discomfort     Assessment:  Plantar fasciitis fifth metatarsal inflammation left doing well with pain right     Plan:  H&P reviewed sterile prep injected the plantar fascia right 3 mg Kenalog 5 mg Xylocaine and sterile prep and injected the fifth MPJ right 3 mg Dexasone Kenalog 5 mg Xylocaine applied fascial brace right continue fascial brace left reappoint as symptoms indicate

## 2022-11-07 ENCOUNTER — Ambulatory Visit (HOSPITAL_COMMUNITY)
Admission: RE | Admit: 2022-11-07 | Discharge: 2022-11-07 | Disposition: A | Discharge status: Home / Self Care | Payer: Medicare HMO | Source: Ambulatory Visit | Attending: Family Medicine | Admitting: Family Medicine

## 2022-11-07 DIAGNOSIS — G43719 Chronic migraine without aura, intractable, without status migrainosus: Secondary | ICD-10-CM | POA: Diagnosis not present

## 2022-11-07 DIAGNOSIS — R413 Other amnesia: Secondary | ICD-10-CM | POA: Diagnosis not present

## 2022-11-07 DIAGNOSIS — R9082 White matter disease, unspecified: Secondary | ICD-10-CM | POA: Diagnosis not present

## 2022-11-07 DIAGNOSIS — F09 Unspecified mental disorder due to known physiological condition: Secondary | ICD-10-CM | POA: Diagnosis not present

## 2022-11-07 MED ORDER — GADOBUTROL 1 MMOL/ML IV SOLN
9.0000 mL | Freq: Once | INTRAVENOUS | Status: AC | PRN
  Administered 2022-11-07: 9 mL via INTRAVENOUS

## 2022-11-26 ENCOUNTER — Encounter: Payer: Self-pay | Admitting: Nurse Practitioner

## 2022-11-26 ENCOUNTER — Ambulatory Visit: Payer: Medicare HMO | Admitting: Nurse Practitioner

## 2022-11-26 VITALS — BP 139/87 | HR 64 | Temp 97.7°F | Ht 65.0 in | Wt 201.6 lb

## 2022-11-26 DIAGNOSIS — S60512A Abrasion of left hand, initial encounter: Secondary | ICD-10-CM

## 2022-11-26 DIAGNOSIS — S40022A Contusion of left upper arm, initial encounter: Secondary | ICD-10-CM | POA: Diagnosis not present

## 2022-11-26 DIAGNOSIS — Z23 Encounter for immunization: Secondary | ICD-10-CM

## 2022-11-26 MED ORDER — MUPIROCIN 2 % EX OINT
TOPICAL_OINTMENT | CUTANEOUS | 0 refills | Status: DC
Start: 2022-11-26 — End: 2023-04-27

## 2022-11-26 NOTE — Progress Notes (Signed)
Subjective:    Patient ID: [name], female    DOB: 03/02/1965, 57 y.o.   MRN: [id number]  HPI Presents for c/o injuries sustained in a fall on 10/15. Was trying on some shoes that were connected when she tripped and fell. Has an cut on her left palm. No fever. Fell on her left arm and has noticed bruising over the past 24 hours. No pain with movement of the shoulder. No weakness. Needs tetanus vaccine.        Objective:   Physical Exam NAD.  Alert, oriented.  Lungs clear.  Heart regular rate rhythm.  Can perform full active and passive ROM of the left shoulder and elbow.  Approximately 4 cm dark ecchymotic area noted on the lateral left upper arm with some faint bruising on the lateral lower arm.  Mildly tender to palpation.  Has a small superficial abrasion on the left palm lateral side.  Slight clear drainage.  No evidence of infection. Today's Vitals   11/26/22 1306  BP: 139/87  Pulse: 64  Temp: 97.7 F (36.5 C)  SpO2: 100%  Weight: 201 lb 9.6 oz (91.4 kg)  Height: 5\' 5"  (1.651 m)   Body mass index is 33.55 kg/m.        Assessment & Plan:   Problem List Items Addressed This Visit   None Visit Diagnoses     Abrasion of palm of left hand, initial encounter    -  Primary   Relevant Orders   Tdap vaccine greater than or equal to 7yo IM   Contusion of multiple sites of left upper extremity, initial encounter       Immunization due       Relevant Orders   Flu vaccine trivalent PF, 6mos and older(Flulaval,Afluria,Fluarix,Fluzone)      Meds ordered this encounter  Medications   mupirocin ointment (BACTROBAN) 2 %    Sig: Apply BID to wound prn    Dispense:  22 g    Refill:  0    Order Specific Question:   Supervising Provider    Answer:   Lilyan Punt A [[id number]]   Keep wound clean and dry open to air is much as possible, apply Bactroban ointment as directed.  Tetanus vaccine given today. Ice/heat applications to area on her arm.  Continue stretching and  movement exercises.  Continue Tylenol for discomfort.  Expect slow gradual resolution. Flu vaccine today. Warning signs reviewed.  Call back if any symptoms worsen or persist.

## 2022-12-01 ENCOUNTER — Ambulatory Visit (INDEPENDENT_AMBULATORY_CARE_PROVIDER_SITE_OTHER): Payer: Medicare HMO | Admitting: Family Medicine

## 2022-12-01 VITALS — BP 137/81 | HR 66 | Temp 98.5°F | Wt 202.8 lb

## 2022-12-01 DIAGNOSIS — E785 Hyperlipidemia, unspecified: Secondary | ICD-10-CM | POA: Diagnosis not present

## 2022-12-01 DIAGNOSIS — G43719 Chronic migraine without aura, intractable, without status migrainosus: Secondary | ICD-10-CM | POA: Diagnosis not present

## 2022-12-01 DIAGNOSIS — R7989 Other specified abnormal findings of blood chemistry: Secondary | ICD-10-CM | POA: Diagnosis not present

## 2022-12-01 DIAGNOSIS — E119 Type 2 diabetes mellitus without complications: Secondary | ICD-10-CM

## 2022-12-01 DIAGNOSIS — E559 Vitamin D deficiency, unspecified: Secondary | ICD-10-CM

## 2022-12-01 DIAGNOSIS — R2689 Other abnormalities of gait and mobility: Secondary | ICD-10-CM | POA: Diagnosis not present

## 2022-12-01 DIAGNOSIS — E1169 Type 2 diabetes mellitus with other specified complication: Secondary | ICD-10-CM | POA: Diagnosis not present

## 2022-12-01 NOTE — Progress Notes (Signed)
Subjective:    Patient ID: [name], female    DOB: 1965/09/09, 57 y.o.   MRN: [id number]  Discussed the use of AI scribe software for clinical note transcription with the patient, who gave verbal consent to proceed.  History of Present Illness   The patient, with a history of migraines, hypertension, and diabetes, reports a significant improvement in their headache frequency and intensity. They have had only two mild episodes since the last visit. They tolerated a recent MRI well, which showed no signs of tumor, cyst, or stroke, but did show evidence of previous migraines.  The patient recently experienced a fall while trying on shoes, resulting in a bruised arm and a gash on their hand. The hand wound is healing well, and the bruising is expected to resolve gradually. They deny any head injury from the fall, but report some hip discomfort. They received a tetanus shot following the fall, which has resulted in a localized inflammatory response.  The patient is currently on Topamax for their migraines, which they report is effective in reducing their headache frequency. They are also on medication for hypertension and diabetes, and report no hypoglycemic episodes. They recently started a new cholesterol medication, Atorvastatin, which they tolerate well.  The patient reports issues with balance, particularly when walking or navigating stairs, often requiring support. They had previously attempted regular walking exercises but stopped due to balance concerns. They also report feeling dizzy or having a "wobbly leg" sensation when off balance.  The patient's recent blood work showed a low vitamin D level. They had previously been taking a multivitamin for women and Elderberry, but had stopped. They continue to take electrolytes. They were advised to start a vitamin D supplement.         Review of Systems     Objective:    Physical Exam   CHEST: Lungs clear to  auscultation. CARDIOVASCULAR: Heart sounds normal. MUSCULOSKELETAL: Ankles without swelling. SKIN: Gash on hand healing, bruise on bicep and elbow resolving.           Assessment & Plan:  Assessment and Plan    Migraine Improvement in frequency and severity of headaches. MRI showed no concerning findings, consistent with history of migraines. -Continue Topamax, as it appears to be effective.  Fall with injuries Recent fall resulting in bruising and a hand laceration. Healing well, but reported balance issues. -Continue to monitor healing of hand laceration and bruising. -Start balance exercises three times a week. -Consider use of a trekking pole for additional balance support during walks.  Vitamin D deficiency Stopped taking supplements. -Resume Vitamin D supplementation.  Hypertension, Diabetes, and Hyperlipidemia Medications reportedly taken regularly. No reported hypoglycemic episodes. -Continue current medications:blood pressure medication, Glipizide, and Atorvastatin.  General Health Maintenance -Received tetanus shot recently, localized inflammation noted and expected to resolve in about three weeks. -Received flu shot. -Check blood work before next visit in early March 2025.     1. Intractable chronic migraine without aura and without status migrainosus Doing much better on current regimen continue as is follow-up by spring  2. Diabetes mellitus without complication (HCC) Continue current regimen stay healthy healthy eating regular activity continue medication check labs in the spring - Hemoglobin A1c - Basic Metabolic Panel - Microalbumin/Creatinine Ratio, Urine  3. Low TSH level Recent lab work looked good repeat lab work in the spring - TSH + free T4  4. Hyperlipidemia associated with type 2 diabetes mellitus (HCC) Previous testing look good continue current measures  healthy diet check lab work in the spring - Lipid panel  5. Vitamin D deficiency Oral  supplementation of vitamin D at thousand or 2000 units daily check vitamin D in the spring - Vitamin D, 25-hydroxy  6. Poor balance Exercises shown to use a walking staff when walking collapsible or adjustable

## 2023-01-15 ENCOUNTER — Other Ambulatory Visit: Payer: Self-pay | Admitting: Family Medicine

## 2023-02-09 MED ORDER — ONETOUCH DELICA PLUS LANCET33G MISC: 0 refills | Status: DC

## 2023-02-09 NOTE — Telephone Encounter (Signed)
 Copied from CRM [id number]. Topic: Clinical - Medication Refill >> Feb 09, 2023  4:00 PM Karel PARAS wrote: Most Recent Primary Care Visit:  Provider: ALPHONSA HAMILTON A  Department: [facility] MED  Visit Type: OFFICE VISIT  Date: 12/01/2022  Medication: Lancets (ONETOUCH DELICA PLUS LANCET33G)  Has the patient contacted their pharmacy? Yes (Agent: If no, request that the patient contact the pharmacy for the refill. If patient does not wish to contact the pharmacy document the reason why and proceed with request.) (Agent: If yes, when and what did the pharmacy advise?)  Is this the correct pharmacy for this prescription? Yes If no, delete pharmacy and type the correct one.  This is the patient's preferred pharmacy:  [facility] [address], [place] - [address] [place] [address] [address] [place] [address] Phone: [phone] Fax: [phone]  [facility] [place], [place] - [address] [place] [address] Phone: [phone] Fax: [phone]   Has the prescription been filled recently? No  Is the patient out of the medication? Yes  Has the patient been seen for an appointment in the last year OR does the patient have an upcoming appointment? No  Can we respond through MyChart? Yes  Agent: Please be advised that Rx refills may take up to 3 business days. We ask that you follow-up with your pharmacy. / Pharmacy needs a new request sent in

## 2023-02-11 ENCOUNTER — Telehealth: Payer: Self-pay

## 2023-02-11 ENCOUNTER — Other Ambulatory Visit: Payer: Self-pay

## 2023-02-11 MED ORDER — ONETOUCH DELICA PLUS LANCET33G MISC: 2 refills | Status: AC

## 2023-02-11 NOTE — Telephone Encounter (Signed)
 Prescription Request  02/11/2023  LOV: Visit date not found  What is the name of the medication or equipment? One Touch Delica Lan 33g MIS  Have you contacted your pharmacy to request a refill? Yes   Which pharmacy would you like this sent to?   [facility]    Patient notified that their request is being sent to the clinical staff for review and that they should receive a response within 2 business days.   Please advise at Mobile [phone] (mobile)

## 2023-02-19 ENCOUNTER — Other Ambulatory Visit: Payer: Self-pay | Admitting: Family Medicine

## 2023-02-19 MED ORDER — RELION PEN NEEDLES 31G X 6 MM MISC: 0 refills | Status: DC

## 2023-02-19 NOTE — Telephone Encounter (Signed)
 Copied from CRM [id number]. Topic: Clinical - Medication Refill >> Feb 18, 2023  3:19 PM Montie POUR wrote: Most Recent Primary Care Visit:  Provider: ALPHONSA HAMILTON A  Department: [facility] MED  Visit Type: OFFICE VISIT  Date: 12/01/2022  Medication: Relion 100 Needles   Has the patient contacted their pharmacy? Yes (Agent: If no, request that the patient contact the pharmacy for the refill. If patient does not wish to contact the pharmacy document the reason why and proceed with request.) (Agent: If yes, when and what did the pharmacy advise?) There was not an order for the needles   Is this the correct pharmacy for this prescription? Yes - [facility] If no, delete pharmacy and type the correct one.  This is the patient's preferred pharmacy:  [facility] [address], [place] - [address] [place] [address] [place] [address] Phone: [phone] Fax: [phone]  [facility] [place], [place] - [address] [place] [address] Phone: [phone] Fax: [phone]   Has the prescription been filled recently? No  Is the patient out of the medication? Yes  Has the patient been seen for an appointment in the last year OR does the patient have an upcoming appointment? Yes  Can we respond through MyChart? No   Agent: Please be advised that Rx refills may take up to 3 business days. We ask that you follow-up with your pharmacy.

## 2023-02-22 ENCOUNTER — Other Ambulatory Visit: Payer: Self-pay | Admitting: Family Medicine

## 2023-03-15 ENCOUNTER — Ambulatory Visit: Payer: Self-pay | Admitting: Internal Medicine

## 2023-03-15 ENCOUNTER — Ambulatory Visit: Payer: Self-pay | Admitting: Family Medicine

## 2023-03-15 NOTE — Telephone Encounter (Signed)
Chief Complaint: rectal swelling Symptoms: rectal swelling, rectal pain, lower abdominal pain Frequency: symptoms for four days Pertinent Negatives: Patient denies rectal bleeding Disposition: [] ED /[] Urgent Care (no appt availability in office) / [x] Appointment(In office/virtual)/ []  [place] [facility]/ [] Home Care/ [] Refused Recommended Disposition /[] [place] [facility]/ []  Follow-up with PCP Additional Notes: Pt reports 4 days of rectal pain and swelling. Pt rates her pain an 8/10 and endorses itching as well. Pt denies discharge or purulent drainage from the swollen area but states it feels "moist." States there is swelling between the vagina and the anus. Pt states her bowel movements are normal and firm/soft. Pt endorses urinating normally. Denies fever. States she has to sit to the side and use a pillow due to pain. Also endorses lower abdominal pain. Denies presence of blood or rectal bleeding. States she has never had this before. Denies hx of hemorrhoids. Per protocol, pt advised to be seen within 4 hours. No availability at [facility]. Pt scheduled an acute visit at [facility] for 1600 today. Pt agreeable to the plan. RN advised to call back if she is ultimately unable to make that appt or has any concerns. Pt verbalized understanding.    Copied from CRM [id number]. Topic: Clinical - Red Word Triage >> Mar 15, 2023  3:28 PM Fuller Mandril wrote: Red Word that prompted transfer to Nurse Triage: Swollen backside. Painful. Reason for Disposition  SEVERE rectal pain (e.g., excruciating, unable to have a bowel movement)  Answer Assessment - Initial Assessment Questions 1. SYMPTOM:  "What's the main symptom you're concerned about?" (e.g., pain, itching, swelling, rash)     Anal swelling and pain. Swelling "at the vagina and the butthole part."  2. ONSET: "When did the swelling start?"     4 days ago 3. RECTAL PAIN: "Do you have any pain around your rectum?" "How bad is the pain?"  (Scale 0-10; or mild,  moderate, severe)   - NONE (0): no pain   - MILD (1-3): doesn't interfere with normal activities    - MODERATE (4-7): interferes with normal activities or awakens from sleep, limping    - SEVERE (8-10): excruciating pain, unable to have a bowel movement      8/10, sitting on the side with a pillow, unable to sit normally 4. RECTAL ITCHING: "Do you have any itching in this area?" "How bad is the itching?"  (Scale 0-10; or mild, moderate, severe)   - NONE: no itching   - MILD: doesn't interfere with normal activities    - MODERATE-SEVERE: interferes with normal activities or awakens from sleep     Yes, 7-8/10 5. CONSTIPATION: "Do you have constipation?" If Yes, ask: "How bad is it?"     No. Having normal bowel movements. Pt says bowel movements are firm/soft. Pt states she is drinking a lot of water and using the bathroom often. 6. CAUSE: "What do you think is causing the anus symptoms?"     Not sure - no hx of hemorrhoids 7. OTHER SYMPTOMS: "Do you have any other symptoms?"  (e.g., abdomen pain, fever, rectal bleeding, vomiting)     Itching, "moist" down there, no rectal bleeding. Lower abdominal pain (7-8/10). Abdominal pain started at the same time as the anal pain and swelling.  Protocols used: Rectal Symptoms-A-AH

## 2023-03-16 ENCOUNTER — Other Ambulatory Visit: Payer: Self-pay | Admitting: Family Medicine

## 2023-03-16 DIAGNOSIS — Z09 Encounter for follow-up examination after completed treatment for conditions other than malignant neoplasm: Secondary | ICD-10-CM

## 2023-03-23 ENCOUNTER — Encounter: Payer: Self-pay | Admitting: Internal Medicine

## 2023-03-23 ENCOUNTER — Ambulatory Visit (INDEPENDENT_AMBULATORY_CARE_PROVIDER_SITE_OTHER): Payer: Medicare HMO | Admitting: Internal Medicine

## 2023-03-23 VITALS — BP 182/83 | HR 78 | Resp 98 | Ht 65.0 in | Wt 208.4 lb

## 2023-03-23 DIAGNOSIS — N764 Abscess of vulva: Secondary | ICD-10-CM | POA: Insufficient documentation

## 2023-03-23 MED ORDER — AMOXICILLIN-POT CLAVULANATE 875-125 MG PO TABS: 1.0000 | ORAL_TABLET | Freq: Two times a day (BID) | ORAL | 0 refills | Status: AC

## 2023-03-23 MED ORDER — DOXYCYCLINE HYCLATE 100 MG PO TABS: 100.0000 mg | ORAL_TABLET | Freq: Two times a day (BID) | ORAL | 0 refills | Status: AC

## 2023-03-23 NOTE — Progress Notes (Signed)
   Acute Office Visit  Subjective:     Patient ID: ANGELEE Tyler, female    DOB: Mar 23, 1965, 57 y.o.   MRN: [id number]  Chief Complaint  Patient presents with   Cyst    Knot in butt/vaginal area    [name] presents today for an acute visit for evaluation of a painful knot in her perineal region.  She first noticed the knot 9-10 days ago.  It has gradually increased in size and become more uncomfortable.  She has tried soaking in warm water, which has brought mild symptom relief.  Pain is worse while defecating.  She has not noted any blood in her stool.  Denies vaginal discharge.  Review of Systems  Skin:        Painful knot in perineal region      Objective:    BP (!) 182/83 (BP Location: Right Arm, Patient Position: Sitting, Cuff Size: Normal)   Pulse 78   Resp (!) 98   Ht 5\' 5"  (1.651 m)   Wt 208 lb 6.4 oz (94.5 kg)   BMI 34.68 kg/m   Physical Exam Vitals reviewed. Exam conducted with a chaperone present.  Genitourinary:    Vagina: No vaginal discharge.     Rectum: Normal.     Comments: Indurated, tender mass present on inferior aspect right labia      Assessment & Plan:   Problem List Items Addressed This Visit       Vulvar abscess - Primary   Presenting today for an acute visit for evaluation of a painful mass in her perineal region.  On exam there is an indurated, tender mass along the right labia radiating inferiorly to the right perineal region.  No rectal involvement.  Findings are concerning for vulvar abscess.  Treatment options discussed.  She has a documented allergy to Bactrim.  Doxycycline and Augmentin x 5 days prescribed for empiric antibiotic treatment.  Recommend continued warm water soaks.  Will place referral to gynecology for evaluation and further management.      Meds ordered this encounter  Medications   doxycycline (VIBRA-TABS) 100 MG tablet    Sig: Take 1 tablet (100 mg total) by mouth 2 (two) times daily for 5 days. 1 po bid     Dispense:  10 tablet    Refill:  0   amoxicillin-clavulanate (AUGMENTIN) 875-125 MG tablet    Sig: Take 1 tablet by mouth 2 (two) times daily for 5 days.    Dispense:  10 tablet    Refill:  0    Return if symptoms worsen or fail to improve.  Billie Lade, MD

## 2023-03-23 NOTE — Patient Instructions (Signed)
It was a pleasure to see you today.  Thank you for giving [place] the opportunity to be involved in your care.  Below is a brief recap of your visit and next steps.   Summary I am concerned that you have an abscess next to your vagina. As we discussed. I have placed a referral to gynecology and prescribed antibiotics. I recommend doing warm water soaks as you have been doing. Follow up with Dr. Gerda Diss for ongoing primary care.

## 2023-03-23 NOTE — Assessment & Plan Note (Signed)
Presenting today for an acute visit for evaluation of a painful mass in her perineal region.  On exam there is an indurated, tender mass along the right labia radiating inferiorly to the right perineal region.  No rectal involvement.  Findings are concerning for vulvar abscess.  Treatment options discussed.  She has a documented allergy to Bactrim.  Doxycycline and Augmentin x 5 days prescribed for empiric antibiotic treatment.  Recommend continued warm water soaks.  Will place referral to gynecology for evaluation and further management.

## 2023-03-25 ENCOUNTER — Encounter: Payer: Medicare HMO | Admitting: Obstetrics & Gynecology

## 2023-03-26 ENCOUNTER — Ambulatory Visit: Payer: Medicare HMO | Admitting: Obstetrics & Gynecology

## 2023-03-26 ENCOUNTER — Encounter: Payer: Self-pay | Admitting: Obstetrics & Gynecology

## 2023-03-26 VITALS — BP 161/89 | HR 90 | Ht 65.0 in | Wt 206.0 lb

## 2023-03-26 DIAGNOSIS — E119 Type 2 diabetes mellitus without complications: Secondary | ICD-10-CM | POA: Diagnosis not present

## 2023-03-26 DIAGNOSIS — N764 Abscess of vulva: Secondary | ICD-10-CM | POA: Diagnosis not present

## 2023-03-26 NOTE — Progress Notes (Signed)
   GYN VISIT Patient name: [name] MRN [id number]  Date of birth: August 29, 1965 Chief Complaint:   vulvar abscess  History of Present Illness:   [name] is a 58 y.o. G1P1 PM, PH female being seen today for vulvar abscess  Vulvar abscess: Started about 2 weeks ago.  She has been soaking in warm bath and notes that it has gotten smaller.  Notes significant pain.  Not sure if it's draining, just noted some moisture.  Denies fever/chills.  No other acute complaints.  Pt seen by PCP on 2/11- started on Doxy and Augmentin.     No LMP recorded. Patient has had a hysterectomy.    Review of Systems:   Pertinent items are noted in HPI Denies fever/chills, dizziness, headaches, visual disturbances, fatigue, shortness of breath, chest pain, abdominal pain, vomiting Pertinent History Reviewed:   Past Surgical History:  Procedure Laterality Date   ABDOMINAL HYSTERECTOMY     COLONOSCOPY WITH PROPOFOL N/A 04/08/2015   Dr. Jena Gauss: normal, repeat in 10 years    ENDOMETRIAL ABLATION     FOREIGN BODY REMOVAL N/A 05/26/2013   Procedure: FOREIGN BODY REMOVAL ADULT ABDOMINAL WALL;  Surgeon: Dalia Heading, MD;  Location: [facility];  Service: General;  Laterality: N/A;   KNEE SURGERY Right 2012   PILONIDAL CYST EXCISION  June 2011   TUBAL LIGATION      Past Medical History:  Diagnosis Date   Allergy    Anxiety    Arthritis    Chronic cough    Chronic low back pain    Chronic pain syndrome    Depression    Diabetes mellitus without complication (HCC)    Folliculitis 07/20/2012   GERD (gastroesophageal reflux disease)    Hidradenitis suppurativa    High cholesterol    Hypertension    IBS (irritable bowel syndrome)    Lumbar spondylolysis    Migraine    Migraine headache    Pulmonary nodule    Reactive airway disease 01/2018   per PFT's   Sleep apnea    Reviewed problem list, medications and allergies. Physical Assessment:   Vitals:   03/26/23 1113  BP: (!) 161/89   Pulse: 90  Weight: 206 lb (93.4 kg)  Height: 5\' 5"  (1.651 m)  Body mass index is 34.28 kg/m.       Physical Examination:   General appearance: alert, well appearing, and in no distress  Psych: mood appropriate, normal affect  Skin: warm & dry   Cardiovascular: normal heart rate noted  Respiratory: normal respiratory effort, no distress  Abdomen: soft, non-tender   Pelvic: VULVA: on initial exam, no abscess appreciated.  No erythema or fluctuation noted.  Right inner thigh with ~ 3 x 2cm area of firmness appreciated with some tenderness.  Otherwise labia unremarkable.  Extremities: no edema   Chaperone: Faith Rogue    Assessment & Plan:  1) Vulvar abscess -significantly improved with conservative management -pt to complete full course of antibiotics -pt concerned about recurrence and reviewed that unfortunately cannot predict nor truly prevent recurrence.  Best option good glycemic control and keep the area clean and dry -questions/concerns were addressed -should she note recurrence or future abscess- RTC ASAP   Return if symptoms worsen or fail to improve.   Myna Hidalgo, DO Attending Obstetrician & Gynecologist, [facility] for [employer], [facility]

## 2023-04-08 ENCOUNTER — Other Ambulatory Visit: Payer: Self-pay | Admitting: Family Medicine

## 2023-04-10 ENCOUNTER — Other Ambulatory Visit: Payer: Self-pay | Admitting: Family Medicine

## 2023-04-12 ENCOUNTER — Other Ambulatory Visit: Payer: Self-pay | Admitting: Family Medicine

## 2023-04-14 ENCOUNTER — Ambulatory Visit: Admitting: Podiatry

## 2023-04-14 ENCOUNTER — Ambulatory Visit (INDEPENDENT_AMBULATORY_CARE_PROVIDER_SITE_OTHER)

## 2023-04-14 ENCOUNTER — Encounter: Payer: Self-pay | Admitting: Podiatry

## 2023-04-14 VITALS — Ht 65.0 in | Wt 206.0 lb

## 2023-04-14 DIAGNOSIS — M21622 Bunionette of left foot: Secondary | ICD-10-CM

## 2023-04-14 DIAGNOSIS — M79672 Pain in left foot: Secondary | ICD-10-CM

## 2023-04-14 DIAGNOSIS — M722 Plantar fascial fibromatosis: Secondary | ICD-10-CM

## 2023-04-14 MED ORDER — TRIAMCINOLONE ACETONIDE 10 MG/ML IJ SUSP
10.0000 mg | Freq: Once | INTRAMUSCULAR | Status: AC
  Administered 2023-04-14: 10 mg via INTRA_ARTICULAR

## 2023-04-14 NOTE — Progress Notes (Signed)
 Subjective:   Patient ID: [name], female   DOB: 58 y.o.   MRN: [id number]   HPI Patient states over the last few weeks she has developed acute pain in the left heel and has prominence around the bone on the outside and knows someday in the future it is possible she will need surgery   ROS      Objective:  Physical Exam  Inflammation with acute pain of the left plantar fascia at the insertion of the tendon into the calcaneus with enlargement of the fifth metatarsal head left that is very painful on the outside with both of them starting in the last month     Assessment:  Acute plantar fasciitis left with tailor's bunion deformity left with prominence     Plan:  H&P reviewed discussed both conditions and at 1 point osteotomy fourth fifth metatarsal head resection for the outsides necessary consistent with a tailor's bunion.  I did educate her on procedure that might be necessary and I did inject the plantar fascia left 3 mg Kenalog 5 mg Xylocaine and a very small amount temporarily around the fifth MPJ courtesy 2 mg dexamethasone Kenalog 3 mg Xylocaine

## 2023-04-15 ENCOUNTER — Ambulatory Visit
Admission: RE | Admit: 2023-04-15 | Discharge: 2023-04-15 | Disposition: A | Discharge status: Home / Self Care | Payer: Medicare HMO | Source: Ambulatory Visit | Attending: Family Medicine | Admitting: Family Medicine

## 2023-04-15 DIAGNOSIS — Z09 Encounter for follow-up examination after completed treatment for conditions other than malignant neoplasm: Secondary | ICD-10-CM

## 2023-04-15 DIAGNOSIS — N6311 Unspecified lump in the right breast, upper outer quadrant: Secondary | ICD-10-CM | POA: Diagnosis not present

## 2023-04-15 DIAGNOSIS — N6325 Unspecified lump in the left breast, overlapping quadrants: Secondary | ICD-10-CM | POA: Diagnosis not present

## 2023-04-19 ENCOUNTER — Telehealth: Payer: Self-pay

## 2023-04-19 ENCOUNTER — Ambulatory Visit: Payer: Medicare HMO | Admitting: Podiatry

## 2023-04-19 NOTE — Telephone Encounter (Signed)
 Prescription Request  04/19/2023  LOV: Visit date not found  What is the name of the medication or equipment? glipiZIDE (GLUCOTROL XL) 2.5 MG 24 hr tablet   Have you contacted your pharmacy to request a refill? Yes   Which pharmacy would you like this sent to?   [facility]    Patient notified that their request is being sent to the clinical staff for review and that they should receive a response within 2 business days.   Please advise at Mobile [phone] (mobile)

## 2023-04-19 NOTE — Telephone Encounter (Signed)
 Pt is wanting 90 day supply on medication

## 2023-04-20 MED ORDER — GLIPIZIDE ER 2.5 MG PO TB24: ORAL_TABLET | ORAL | 0 refills | Status: DC

## 2023-04-20 NOTE — Telephone Encounter (Signed)
Received via fax Rx request: Prescription sent electronically to pharmacy  

## 2023-04-27 ENCOUNTER — Encounter: Payer: Self-pay | Admitting: Nurse Practitioner

## 2023-04-27 ENCOUNTER — Ambulatory Visit (INDEPENDENT_AMBULATORY_CARE_PROVIDER_SITE_OTHER): Admitting: Nurse Practitioner

## 2023-04-27 VITALS — BP 130/80 | HR 71 | Temp 98.2°F | Ht 65.0 in | Wt 207.0 lb

## 2023-04-27 DIAGNOSIS — R7989 Other specified abnormal findings of blood chemistry: Secondary | ICD-10-CM | POA: Diagnosis not present

## 2023-04-27 DIAGNOSIS — R3915 Urgency of urination: Secondary | ICD-10-CM | POA: Diagnosis not present

## 2023-04-27 DIAGNOSIS — E559 Vitamin D deficiency, unspecified: Secondary | ICD-10-CM | POA: Diagnosis not present

## 2023-04-27 DIAGNOSIS — E1169 Type 2 diabetes mellitus with other specified complication: Secondary | ICD-10-CM | POA: Diagnosis not present

## 2023-04-27 DIAGNOSIS — E119 Type 2 diabetes mellitus without complications: Secondary | ICD-10-CM | POA: Diagnosis not present

## 2023-04-27 DIAGNOSIS — R5383 Other fatigue: Secondary | ICD-10-CM

## 2023-04-27 DIAGNOSIS — R350 Frequency of micturition: Secondary | ICD-10-CM

## 2023-04-27 DIAGNOSIS — E785 Hyperlipidemia, unspecified: Secondary | ICD-10-CM | POA: Diagnosis not present

## 2023-04-27 LAB — POCT URINALYSIS DIP (CLINITEK)
Bilirubin, UA: NEGATIVE
Blood, UA: NEGATIVE
Glucose, UA: NEGATIVE mg/dL
Ketones, POC UA: NEGATIVE mg/dL
Nitrite, UA: NEGATIVE
Spec Grav, UA: 1.015 (ref 1.010–1.025)
Urobilinogen, UA: 0.2 U/dL
pH, UA: 7 (ref 5.0–8.0)

## 2023-04-27 LAB — POCT UA - MICROSCOPIC ONLY

## 2023-04-27 MED ORDER — NITROFURANTOIN MONOHYD MACRO 100 MG PO CAPS: 100.0000 mg | ORAL_CAPSULE | Freq: Two times a day (BID) | ORAL | 0 refills | Status: DC

## 2023-04-27 NOTE — Progress Notes (Signed)
 Subjective:    Patient ID: [name], female    DOB: 11-20-1965, 58 y.o.   MRN: [id number]  HPI Presents for complaints of urinary frequency and urgency for the past 2 weeks.  No dysuria.  No odor.  No noted hematuria.  No fever.  Notices a bubbly foamy look to the urine which comes and goes.  Can get up 4-5 times per night to urinate.  Mild suprapubic area discomfort at times.  No flank or back pain.  Has a history of mild stress urinary incontinence.  No sexual activity for the past 2 years.  Defers need for STD testing.  No vaginal discharge.  No pelvic pain.  No recent history of UTI.  Has had abdominal hysterectomy.  Has not done lab work that was ordered in October.        Objective:   Physical Exam NAD.  Alert, oriented.  Lungs clear.  Heart regular rate rhythm.  No CVA or flank tenderness.  Abdomen soft nondistended with mild suprapubic area discomfort on exam.  Otherwise benign.  Results for orders placed or performed in visit on 04/27/23  POCT URINALYSIS DIP (CLINITEK)   Collection Time: 04/27/23 11:40 AM  Result Value Ref Range   Color, UA yellow yellow   Clarity, UA cloudy (A) clear   Glucose, UA negative negative mg/dL   Bilirubin, UA negative negative   Ketones, POC UA negative negative mg/dL   Spec Grav, UA 6.440 3.474 - 1.025   Blood, UA negative negative   pH, UA 7.0 5.0 - 8.0   POC PROTEIN,UA trace negative, trace   Urobilinogen, UA 0.2 0.2 or 1.0 E.U./dL   Nitrite, UA Negative Negative   Leukocytes, UA Moderate (2+) (A) Negative  POCT UA - Microscopic Only   Collection Time: 04/27/23 12:00 PM  Result Value Ref Range   WBC, Ur, HPF, POC 1-5 0 - 5   RBC, Urine, Miroscopic none 0 - 2   Bacteria, U Microscopic none None - Trace   Mucus, UA     Epithelial cells, urine per micros rare    Crystals, Ur, HPF, POC     Casts, Ur, LPF, POC     Yeast, UA     Today's Vitals   04/27/23 1129  BP: 130/80  Pulse: 71  Temp: 98.2 F (36.8 C)  SpO2: 98%   Weight: 207 lb (93.9 kg)  Height: 5\' 5"  (1.651 m)   Body mass index is 34.45 kg/m.      Assessment & Plan:   Problem List Items Addressed This Visit   None Visit Diagnoses       Urinary frequency    -  Primary   Relevant Orders   POCT URINALYSIS DIP (CLINITEK) (Completed)   Urine Culture   POCT UA - Microscopic Only (Completed)     Urinary urgency       Relevant Orders   POCT URINALYSIS DIP (CLINITEK) (Completed)   Urine Culture   POCT UA - Microscopic Only (Completed)     Fatigue, unspecified type          Meds ordered this encounter  Medications   nitrofurantoin, macrocrystal-monohydrate, (MACROBID) 100 MG capsule    Sig: Take 1 capsule (100 mg total) by mouth 2 (two) times daily. For bladder    Dispense:  10 capsule    Refill:  0    Supervising Provider:   Lilyan Punt A [[id number]]   Start Macrobid as directed.  Use AZO OTC as  directed for bladder spasms.  Urine culture pending.  Patient will go by the lab when she leaves here to get her lab work done from October. Warning signs reviewed. Call back if symptoms worsen or persist.

## 2023-04-27 NOTE — Patient Instructions (Signed)
 AZO as directed for bladder spasms

## 2023-04-28 ENCOUNTER — Encounter: Payer: Self-pay | Admitting: Family Medicine

## 2023-04-28 LAB — LIPID PANEL
Chol/HDL Ratio: 2.8 ratio (ref 0.0–4.4)
Cholesterol, Total: 132 mg/dL (ref 100–199)
HDL: 47 mg/dL (ref >39)
LDL Chol Calc (NIH): 72 mg/dL (ref 0–99)
Triglycerides: 63 mg/dL (ref 0–149)
VLDL Cholesterol Cal: 13 mg/dL (ref 5–40)

## 2023-04-28 LAB — BASIC METABOLIC PANEL
BUN/Creatinine Ratio: 18 (ref 9–23)
BUN: 13 mg/dL (ref 6–24)
CO2: 25 mmol/L (ref 20–29)
Calcium: 9.8 mg/dL (ref 8.7–10.2)
Chloride: 99 mmol/L (ref 96–106)
Creatinine, Ser: 0.73 mg/dL (ref 0.57–1.00)
Glucose: 112 mg/dL — ABNORMAL HIGH (ref 70–99)
Potassium: 4.4 mmol/L (ref 3.5–5.2)
Sodium: 142 mmol/L (ref 134–144)
eGFR: 95 mL/min/{1.73_m2} (ref >59)

## 2023-04-28 LAB — MICROALBUMIN / CREATININE URINE RATIO
Creatinine, Urine: 107 mg/dL
Microalb/Creat Ratio: 24 mg/g{creat} (ref 0–29)
Microalbumin, Urine: 25.5 ug/mL

## 2023-04-28 LAB — TSH+FREE T4
Free T4: 1.54 ng/dL (ref 0.82–1.77)
TSH: 0.772 u[IU]/mL (ref 0.450–4.500)

## 2023-04-28 LAB — VITAMIN D 25 HYDROXY (VIT D DEFICIENCY, FRACTURES): Vit D, 25-Hydroxy: 30.6 ng/mL (ref 30.0–100.0)

## 2023-04-28 LAB — HEMOGLOBIN A1C
Est. average glucose Bld gHb Est-mCnc: 143 mg/dL
Hgb A1c MFr Bld: 6.6 % — ABNORMAL HIGH (ref 4.8–5.6)

## 2023-04-29 ENCOUNTER — Encounter: Payer: Self-pay | Admitting: Nurse Practitioner

## 2023-04-29 LAB — URINE CULTURE

## 2023-04-30 ENCOUNTER — Encounter: Payer: Self-pay | Admitting: Family Medicine

## 2023-04-30 ENCOUNTER — Ambulatory Visit (INDEPENDENT_AMBULATORY_CARE_PROVIDER_SITE_OTHER): Admitting: Family Medicine

## 2023-04-30 VITALS — BP 140/72 | HR 68 | Temp 97.7°F | Ht 65.0 in | Wt 209.0 lb

## 2023-04-30 DIAGNOSIS — E119 Type 2 diabetes mellitus without complications: Secondary | ICD-10-CM

## 2023-04-30 DIAGNOSIS — E785 Hyperlipidemia, unspecified: Secondary | ICD-10-CM | POA: Diagnosis not present

## 2023-04-30 DIAGNOSIS — R5383 Other fatigue: Secondary | ICD-10-CM

## 2023-04-30 DIAGNOSIS — E1169 Type 2 diabetes mellitus with other specified complication: Secondary | ICD-10-CM

## 2023-04-30 DIAGNOSIS — R079 Chest pain, unspecified: Secondary | ICD-10-CM

## 2023-04-30 NOTE — Progress Notes (Signed)
 Subjective:    Patient ID: [name], female    DOB: Feb 01, 1966, 58 y.o.   MRN: [id number]  HPI Discussed the use of AI scribe software for clinical note transcription with the patient, who gave verbal consent to proceed.  History of Present Illness   [name] is a 58 year old female with diabetes and hyperlipidemia who presents with fatigue and chest pain.  She experiences chest pain characterized by tightness and heaviness, particularly at night or when sitting for extended periods. Relief is found through massaging the chest and taking deep breaths. This discomfort has persisted for over two weeks.  Significant fatigue has been present for over a month, described as feeling 'washed out' and not refreshed upon waking. This fatigue persists throughout the day, affecting her ability to engage in activities, including driving long distances. She describes sleep disturbances, including difficulty falling asleep, frequent awakenings, and non-restorative sleep. This pattern has been ongoing for several months. She mentions snoring and feeling fatigued during the day, with occasional sleepiness while driving long distances.  Increased stress levels are acknowledged, attributed to external factors such as financial concerns and current events, impacting her overall well-being.  Her diet includes vegetables and fruits, and she takes vitamin D supplements. Recent blood work showed a vitamin D level of 30, within the normal range. A1c levels have fluctuated between 6.3 and 6.7 over the past year, with the most recent level being 6.6. She is mindful of her diet to prevent her A1c from reaching 7.      Patient states that the discomfort in her chest sometimes with activity sometimes at rest more in the central and left central area of the chest shortness of breath with it at times   Review of Systems     Objective:   Physical Exam General-in no acute distress Eyes-no  discharge Lungs-respiratory rate normal, CTA CV-no murmurs,RRR Extremities skin warm dry no edema Neuro grossly normal Behavior normal, alert        Assessment & Plan:  1. Diabetes mellitus without complication (HCC) (Primary) Healthy diet continue medication.  Comprehensive lab work before next visit in June  2. Fatigue, unspecified type Patient concerned about cortisol levels we will check that along with CBC. - CBC - Cortisol - Ambulatory referral to Cardiology  3. Hyperlipidemia associated with type 2 diabetes mellitus (HCC) Goal is to keep LDL below 70.  Will check comprehensive lab work before her visit in June  4. Chest pain, unspecified type This does not sound anginal.  But with patient being diabetic could have atypical symptoms it is reasonable to see cardiology.  May also be reasonable to do coronary calcium scan - Ambulatory referral to Cardiology  Assessment and Plan    Chest Pain Intermittent chest pain with tightness and heaviness, especially at night. Elevated heart disease risk due to diabetes and hyperlipidemia. Cardiologist referral advised to rule out cardiac causes. - Refer to cardiologist for further evaluation of chest discomfort.  Fatigue and Insomnia Chronic fatigue and poor sleep quality. Stress may contribute. Caffeine intake reduced. Melatonin considered due to favorable side effect profile. - Recommend trial of melatonin for sleep improvement. - Discuss potential for prescription medication if melatonin is ineffective.  Type 2 Diabetes Mellitus A1c fluctuated between 6.3 and 6.7, currently 6.6. Emphasized maintaining A1c below 7.0. Dietary habits generally good, caution against excessive snacking. - Advise portion control with snacks, particularly chips. - Continue monitoring A1c levels.  Hyperlipidemia LDL slightly above target at  72 mg/dL, goal below 70 mg/dL. HDL favorable at 47 mg/dL. Continued monitoring advised. - Continue current  lipid management regimen.  General Health Maintenance Vitamin D level at 30 ng/mL, within normal range. Continue supplementation. - Continue vitamin D supplementation, increase to 2000 IU if desired.  Follow-up Follow-up includes cardiology consultation and blood work. Blood pressure re-evaluation planned. - Schedule cardiology consultation in [place], expected in about five weeks. - Order fasting blood work, to be done between 8 and 9 AM. - Reschedule follow-up appointment to early June to review cardiology findings and recheck blood pressure.

## 2023-05-04 ENCOUNTER — Ambulatory Visit: Payer: Medicare HMO | Admitting: Family Medicine

## 2023-05-10 DIAGNOSIS — R5383 Other fatigue: Secondary | ICD-10-CM | POA: Diagnosis not present

## 2023-05-11 ENCOUNTER — Encounter: Payer: Self-pay | Admitting: Family Medicine

## 2023-05-11 LAB — CBC
Hematocrit: 37.7 % (ref 34.0–46.6)
Hemoglobin: 12.2 g/dL (ref 11.1–15.9)
MCH: 28.7 pg (ref 26.6–33.0)
MCHC: 32.4 g/dL (ref 31.5–35.7)
MCV: 89 fL (ref 79–97)
Platelets: 253 10*3/uL (ref 150–450)
RBC: 4.25 x10E6/uL (ref 3.77–5.28)
RDW: 13.5 % (ref 11.7–15.4)
WBC: 5.1 10*3/uL (ref 3.4–10.8)

## 2023-05-11 LAB — CORTISOL: Cortisol: 18.8 ug/dL (ref 6.2–19.4)

## 2023-05-20 ENCOUNTER — Other Ambulatory Visit: Payer: Self-pay | Admitting: Family Medicine

## 2023-05-28 ENCOUNTER — Ambulatory Visit (INDEPENDENT_AMBULATORY_CARE_PROVIDER_SITE_OTHER): Payer: Medicare HMO

## 2023-05-28 VITALS — Ht 65.0 in | Wt 209.0 lb

## 2023-05-28 DIAGNOSIS — Z0000 Encounter for general adult medical examination without abnormal findings: Secondary | ICD-10-CM | POA: Diagnosis not present

## 2023-05-28 NOTE — Progress Notes (Signed)
 Subjective:   [name] is a 58 y.o. who presents for a Medicare Wellness preventive visit.  Visit Complete: Virtual I connected with  Alroy Aspen on 05/28/23 by a audio enabled telemedicine application and verified that I am speaking with the correct person using two identifiers.  Patient Location: Home  Provider Location: Home Office  I discussed the limitations of evaluation and management by telemedicine. The patient expressed understanding and agreed to proceed.  Vital Signs: Because this visit was a virtual/telehealth visit, some criteria may be missing or patient reported. Any vitals not documented were not able to be obtained and vitals that have been documented are patient reported.  VideoDeclined- This patient declined Librarian, academic. Therefore the visit was completed with audio only.  Persons Participating in Visit: Patient.  AWV Questionnaire: Yes: Patient Medicare AWV questionnaire was completed by the patient on 05/24/23; I have confirmed that all information answered by patient is correct and no changes since this date.  Cardiac Risk Factors include: diabetes mellitus;dyslipidemia;hypertension     Objective:    Today's Vitals   05/28/23 1104  Weight: 209 lb (94.8 kg)  Height: 5\' 5"  (1.651 m)   Body mass index is 34.78 kg/m.     05/28/2023    1:00 PM 05/22/2022   10:58 AM 05/13/2021    9:18 AM 06/11/2020    1:58 PM 05/24/2020    2:40 PM 04/16/2018   12:23 PM 12/25/2017    2:18 PM  Advanced Directives  Does Patient Have a Medical Advance Directive? No No No No No No No  Would patient like information on creating a medical advance directive? Yes (MAU/Ambulatory/Procedural Areas - Information given) Yes (MAU/Ambulatory/Procedural Areas - Information given) No - Patient declined No - Patient declined No - Patient declined No - Patient declined     Current Medications (verified) Outpatient Encounter Medications as of  05/28/2023  Medication Sig   albuterol  (VENTOLIN  HFA) 108 (90 Base) MCG/ACT inhaler Inhale 2 puffs into the lungs every 4 (four) hours as needed.   amLODipine  (NORVASC ) 5 MG tablet Take 1 tablet (5 mg total) by mouth daily.   Ascorbic Acid (VITAMIN C PO) Take 250 mg by mouth.   atorvastatin  (LIPITOR) 10 MG tablet One Monday weds and friday   blood glucose meter kit and supplies Dispense based on patient and insurance preference. Use to test sugar once a day. For dx E11.9   fluticasone  (FLONASE ) 50 MCG/ACT nasal spray Place 2 sprays into both nostrils daily as needed for allergies or rhinitis.   glipiZIDE  (GLUCOTROL  XL) 2.5 MG 24 hr tablet Take 1 tablet daily   Insulin Pen Needle (RELION MINI PEN NEEDLES) 31G X 6 MM MISC USE AS DIRECTED WITH VICTOZA  PEN.   Lancets (ONETOUCH DELICA PLUS LANCET33G) MISC USE 1 LANCET TO CHECK GLUCOSE ONCE DAILY   Multiple Vitamins-Minerals (MULTIPLE VITAMINS/WOMENS PO) Take by mouth.   ONETOUCH VERIO test strip USE 1 STRIP TO CHECK GLUCOSE ONCE DAILY   pantoprazole  (PROTONIX ) 40 MG tablet Take 1 tablet by mouth once daily   sertraline  (ZOLOFT ) 100 MG tablet 150 mg daily which is 1-1/2 tablets   topiramate  (TOPAMAX ) 50 MG tablet TAKE 1 TABLET BY MOUTH AT BEDTIME FOR MIGRAINE HEADACHE   valsartan  (DIOVAN ) 80 MG tablet Take 1 tablet by mouth once daily   VITAMIN D  PO Take by mouth.   [DISCONTINUED] nitrofurantoin , macrocrystal-monohydrate, (MACROBID ) 100 MG capsule Take 1 capsule (100 mg total) by mouth 2 (two) times  daily. For bladder   No facility-administered encounter medications on file as of 05/28/2023.    Allergies (verified) Bactrim  [sulfamethoxazole -trimethoprim ], Lisinopril , Other, Metformin and related, and Ozempic  (0.25 or 0.5 mg-dose) [semaglutide (0.25 or 0.5mg -dos)]   History: Past Medical History:  Diagnosis Date   Allergy    Anxiety    Arthritis    Chronic cough    Chronic low back pain    Chronic pain syndrome    Depression    Diabetes  mellitus without complication (HCC)    Folliculitis 07/20/2012   GERD (gastroesophageal reflux disease)    Hidradenitis suppurativa    High cholesterol    Hypertension    IBS (irritable bowel syndrome)    Lumbar spondylolysis    Migraine    Migraine headache    Pulmonary nodule    Reactive airway disease 01/2018   per PFT's   Sleep apnea    Past Surgical History:  Procedure Laterality Date   ABDOMINAL HYSTERECTOMY     COLONOSCOPY WITH PROPOFOL  N/A 04/08/2015   Dr. Riley Cheadle: normal, repeat in 10 years    ENDOMETRIAL ABLATION     FOREIGN BODY REMOVAL N/A 05/26/2013   Procedure: FOREIGN BODY REMOVAL ADULT ABDOMINAL WALL;  Surgeon: Beau Bound, MD;  Location: [facility];  Service: General;  Laterality: N/A;   KNEE SURGERY Right 2012   PILONIDAL CYST EXCISION  June 2011   TUBAL LIGATION     Family History  Problem Relation Age of Onset   Stroke Mother    Hypertension Mother    Diabetes Mother    Hypercholesterolemia Mother    Depression Mother    Anxiety disorder Mother    Thyroid  disease Father    Diabetes Father    Hypertension Sister    Hypertension Brother    Cancer Paternal Grandfather        prostate   Colon cancer Maternal Grandfather    Asthma Brother    Healthy Daughter    Prostate cancer Other        a grandfather   Migraines Neg Hx    Social History   Socioeconomic History   Marital status: Single    Spouse name: Not on file   Number of children: 1   Years of education: Not on file   Highest education level: High school graduate  Occupational History   Occupation: unemployed  Tobacco Use   Smoking status: Never   Smokeless tobacco: Never  Vaping Use   Vaping status: Never Used  Substance and Sexual Activity   Alcohol use: No    Alcohol/week: 0.0 standard drinks of alcohol   Drug use: No   Sexual activity: Not Currently    Birth control/protection: Surgical  Other Topics Concern   Not on file  Social History Narrative   Lives alone   Right handed    Caffeine: maybe 1 cup of tea if she goes out to dinner. "I don't really do caffeine".   1 daughter, 1 grand daughter and 1 grandson.   Social Drivers of Health   Financial Resource Strain: High Risk (05/24/2023)   Overall Financial Resource Strain (CARDIA)    Difficulty of Paying Living Expenses: Hard  Food Insecurity: Food Insecurity Present (05/24/2023)   Hunger Vital Sign    Worried About Running Out of Food in the Last Year: Often true    Ran Out of Food in the Last Year: Often true  Transportation Needs: No Transportation Needs (05/24/2023)   PRAPARE - Transportation    Lack  of Transportation (Medical): No    Lack of Transportation (Non-Medical): No  Physical Activity: Insufficiently Active (05/28/2023)   Exercise Vital Sign    Days of Exercise per Week: 3 days    Minutes of Exercise per Session: 10 min  Stress: Stress Concern Present (05/24/2023)   [employer] of Occupational Health - Occupational Stress Questionnaire    Feeling of Stress : Very much  Social Connections: Unknown (05/24/2023)   Social Connection and Isolation Panel [NHANES]    Frequency of Communication with Friends and Family: Three times a week    Frequency of Social Gatherings with Friends and Family: Never    Attends Religious Services: 1 to 4 times per year    Active Member of [employer] or Organizations: Not on file    Attends Banker Meetings: Not on file    Marital Status: Never married    Tobacco Counseling Counseling given: Not Answered    Clinical Intake:  Pre-visit preparation completed: Yes  Pain : No/denies pain     Diabetes: No  Lab Results  Component Value Date   HGBA1C 6.6 (H) 04/27/2023   HGBA1C 6.4 (H) 09/29/2022   HGBA1C 6.3 (H) 05/04/2022     How often do you need to have someone help you when you read instructions, pamphlets, or other written materials from your doctor or pharmacy?: 1 - Never  Interpreter Needed?: No  Information entered by :: Seabron Cypress LPN   Activities of Daily Living     05/28/2023   12:55 PM  In your present state of health, do you have any difficulty performing the following activities:  Hearing? 0  Vision? 0  Difficulty concentrating or making decisions? 0  Walking or climbing stairs? 0  Dressing or bathing? 0  Doing errands, shopping? 0  Preparing Food and eating ? N  Using the Toilet? N  In the past six months, have you accidently leaked urine? N  Do you have problems with loss of bowel control? N  Managing your Medications? N  Managing your Finances? N  Housekeeping or managing your Housekeeping? N    Patient Care Team: Bennet Brasil, MD as PCP - General (Family Medicine) Riley Cheadle Windsor Hatcher, MD as Consulting Physician (Gastroenterology) Glory Larsen, MD as Consulting Physician (Neurology) Ronn Cohn, MD as Consulting Physician (Orthopedic Surgery) Joette Mustard as Consulting Physician (Optometry)  Indicate any recent Medical Services you may have received from other than [facility] providers in the past year (date may be approximate).     Assessment:   This is a routine wellness examination for [name].  Hearing/Vision screen Hearing Screening - Comments:: Denies hearing difficulties   Vision Screening - Comments:: Wears rx glasses - up to date with routine eye exams with Joette Mustard    Goals Addressed             This Visit's Progress    Remain mobile         Depression Screen     05/28/2023   11:21 AM 04/30/2023    9:21 AM 04/27/2023   11:30 AM 12/01/2022   12:19 PM 11/02/2022    4:10 PM 10/06/2022   10:36 AM 05/22/2022   10:51 AM  PHQ 2/9 Scores  PHQ - 2 Score 4 4 4 4 4 4 4   PHQ- 9 Score 16 16 16 17 18 14 15     Fall Risk     05/28/2023   12:55 PM 04/30/2023    9:21 AM 04/27/2023  11:30 AM 03/23/2023    2:13 PM 12/01/2022   11:34 AM  Fall Risk   Falls in the past year? 0 0 0 0 1  Number falls in past yr: 0   0 0  Injury with Fall? 0   0 1  Risk for fall due  to : No Fall Risks   No Fall Risks   Follow up Falls prevention discussed;Education provided;Falls evaluation completed   Falls evaluation completed     MEDICARE RISK AT HOME: Medicare Risk at Home Any stairs in or around the home?: No If so, are there any without handrails?: No Home free of loose throw rugs in walkways, pet beds, electrical cords, etc?: Yes Adequate lighting in your home to reduce risk of falls?: Yes Life alert?: No Use of a cane, walker or w/c?: No Grab bars in the bathroom?: Yes Shower chair or bench in shower?: No Elevated toilet seat or a handicapped toilet?: Yes  TIMED UP AND GO:  Was the test performed?  No  Cognitive Function: 6CIT completed        05/28/2023   12:59 PM 05/22/2022   10:58 AM 05/13/2021    9:26 AM  6CIT Screen  What Year? 0 points 0 points 0 points  What month? 0 points 0 points 0 points  What time? 0 points 0 points 0 points  Count back from 20 0 points 0 points 0 points  Months in reverse 0 points 0 points 0 points  Repeat phrase 0 points 0 points 2 points  Total Score 0 points 0 points 2 points    Immunizations Immunization History  Administered Date(s) Administered   Influenza Split 12/01/2012   Influenza, Seasonal, Injecte, Preservative Fre 11/26/2022   Influenza,inj,Quad PF,6+ Mos 11/08/2013, 12/19/2014, 12/02/2015, 12/21/2017, 11/29/2018, 12/05/2019, 12/03/2020, 12/22/2021   Influenza-Unspecified 11/10/2011, 12/29/2018   PFIZER(Purple Top)SARS-COV-2 Vaccination 10/28/2019, 11/18/2019   Pneumococcal Polysaccharide-23 11/10/2010   Tdap 11/26/2022    Screening Tests Health Maintenance  Topic Date Due   OPHTHALMOLOGY EXAM  05/13/2022   Zoster Vaccines- Shingrix (1 of 2) 09/08/2023 (Originally 10/20/2013)   Pneumococcal Vaccine 33-48 Years old (2 of 2 - PCV) 04/26/2024 (Originally 11/10/2011)   INFLUENZA VACCINE  09/10/2023   FOOT EXAM  10/06/2023   HEMOGLOBIN A1C  10/28/2023   Diabetic kidney evaluation - eGFR  measurement  04/26/2024   Diabetic kidney evaluation - Urine ACR  04/26/2024   Medicare Annual Wellness (AWV)  05/27/2024   Colonoscopy  04/07/2025   MAMMOGRAM  04/14/2025   DTaP/Tdap/Td (2 - Td or Tdap) 11/25/2032   Hepatitis C Screening  Completed   HIV Screening  Completed   HPV VACCINES  Aged Out   Meningococcal B Vaccine  Aged Out   COVID-19 Vaccine  Discontinued    Health Maintenance  Health Maintenance Due  Topic Date Due   OPHTHALMOLOGY EXAM  05/13/2022   Health Maintenance Items Addressed: Requested records from diabetic eye exam   Additional Screening:  Vision Screening: Recommended annual ophthalmology exams for early detection of glaucoma and other disorders of the eye.  Dental Screening: Recommended annual dental exams for proper oral hygiene  Community Resource Referral / Chronic Care Management: CRR required this visit?  No   CCM required this visit?  No     Plan:     I have personally reviewed and noted the following in the patient's chart:   Medical and social history Use of alcohol, tobacco or illicit drugs  Current medications and supplements including opioid  prescriptions. Patient is not currently taking opioid prescriptions. Functional ability and status Nutritional status Physical activity Advanced directives List of other physicians Hospitalizations, surgeries, and ER visits in previous 12 months Vitals Screenings to include cognitive, depression, and falls Referrals and appointments  In addition, I have reviewed and discussed with patient certain preventive protocols, quality metrics, and best practice recommendations. A written personalized care plan for preventive services as well as general preventive health recommendations were provided to patient.     Seabron Cypress [place], [place]   07/30/3084   After Visit Summary: (MyChart) Due to this being a telephonic visit, the after visit summary with patients personalized plan was offered  to patient via MyChart   Notes: Nothing significant to report at this time.

## 2023-05-28 NOTE — Patient Instructions (Signed)
 [name] , Thank you for taking time to come for your Medicare Wellness Visit. I appreciate your ongoing commitment to your health goals. Please review the following plan we discussed and let me know if I can assist you in the future.   Referrals/Orders/Follow-Ups/Clinician Recommendations: Aim for 30 minutes of exercise or brisk walking, 6-8 glasses of water, and 5 servings of fruits and vegetables each day.  This is a list of the screening recommended for you and due dates:  Health Maintenance  Topic Date Due   Eye exam for diabetics  05/13/2022   Zoster (Shingles) Vaccine (1 of 2) 09/08/2023*   Pneumococcal Vaccination (2 of 2 - PCV) 04/26/2024*   Flu Shot  09/10/2023   Complete foot exam   10/06/2023   Hemoglobin A1C  10/28/2023   Yearly kidney function blood test for diabetes  04/26/2024   Yearly kidney health urinalysis for diabetes  04/26/2024   Medicare Annual Wellness Visit  05/27/2024   Colon Cancer Screening  04/07/2025   Mammogram  04/14/2025   DTaP/Tdap/Td vaccine (2 - Td or Tdap) 11/25/2032   Hepatitis C Screening  Completed   HIV Screening  Completed   HPV Vaccine  Aged Out   Meningitis B Vaccine  Aged Out   COVID-19 Vaccine  Discontinued  *Topic was postponed. The date shown is not the original due date.    Advanced directives: (ACP Link)Information on Advanced Care Planning can be found at [place]  Secretary of [facility] Advance Health Care Directives Advance Health Care Directives. [email or web address]   Next Medicare Annual Wellness Visit scheduled for next year: Yes

## 2023-07-19 DIAGNOSIS — M170 Bilateral primary osteoarthritis of knee: Secondary | ICD-10-CM | POA: Diagnosis not present

## 2023-07-21 ENCOUNTER — Other Ambulatory Visit: Payer: Self-pay | Admitting: Family Medicine

## 2023-08-06 ENCOUNTER — Telehealth: Payer: Self-pay | Admitting: Family Medicine

## 2023-08-06 ENCOUNTER — Ambulatory Visit: Admitting: Family Medicine

## 2023-08-06 ENCOUNTER — Encounter: Payer: Self-pay | Admitting: Family Medicine

## 2023-08-06 VITALS — BP 138/85 | HR 75 | Temp 97.9°F | Ht 65.0 in | Wt 205.0 lb

## 2023-08-06 DIAGNOSIS — E119 Type 2 diabetes mellitus without complications: Secondary | ICD-10-CM | POA: Diagnosis not present

## 2023-08-06 DIAGNOSIS — R0981 Nasal congestion: Secondary | ICD-10-CM

## 2023-08-06 DIAGNOSIS — R0609 Other forms of dyspnea: Secondary | ICD-10-CM

## 2023-08-06 MED ORDER — GLIPIZIDE ER 2.5 MG PO TB24: ORAL_TABLET | ORAL | 2 refills | Status: AC

## 2023-08-06 MED ORDER — AZELASTINE HCL 0.1 % NA SOLN: 2.0000 | Freq: Two times a day (BID) | NASAL | 12 refills | Status: DC

## 2023-08-06 MED ORDER — ATORVASTATIN CALCIUM 10 MG PO TABS: ORAL_TABLET | ORAL | 3 refills | Status: DC

## 2023-08-06 MED ORDER — PANTOPRAZOLE SODIUM 40 MG PO TBEC: 40.0000 mg | DELAYED_RELEASE_TABLET | Freq: Every day | ORAL | 2 refills | Status: AC

## 2023-08-06 MED ORDER — AMLODIPINE BESYLATE 5 MG PO TABS: 5.0000 mg | ORAL_TABLET | Freq: Every day | ORAL | 1 refills | Status: AC

## 2023-08-06 MED ORDER — TOPIRAMATE 50 MG PO TABS: ORAL_TABLET | ORAL | 1 refills | Status: AC

## 2023-08-06 MED ORDER — SERTRALINE HCL 100 MG PO TABS: ORAL_TABLET | ORAL | 1 refills | Status: AC

## 2023-08-06 NOTE — Progress Notes (Signed)
   Subjective:    Patient ID: [name], female    DOB: [birth year], 58 y.o.   MRN: [id number]  HPI 5 month f/u  DM Discussed the use of AI scribe software for clinical note transcription with the patient, who gave verbal consent to proceed.  History of Present Illness   [name] is a 58 year old female with diabetes who presents with shortness of breath and sinus congestion.  Dyspnea - Shortness of breath, particularly at night when lying supine and during prolonged ambulation - Requires propping up to breathe more comfortably - Occasionally takes deep breaths to alleviate symptoms - Symptoms have been intermittent but have worsened recently - Uncertain if stress is contributing to symptoms  Sinus congestion - Sinus congestion for the past three weeks - Stuffy nose, postnasal drip, and pressure in the sinus area - Nasal discharge is sometimes clunky but not purulent - Tried Mucinex and a liquid allergy medication without relief  Diabetes mellitus management - Diabetes mellitus with inconsistent medication adherence, especially during periods of stress and depression - Recently resumed diabetes medications      Hypertension  Hyperlpidemia     Review of Systems     Objective:   Physical Exam  General-in no acute distress Eyes-no discharge Lungs-respiratory rate normal, CTA CV-no murmurs,RRR Extremities skin warm dry no edema Neuro grossly normal Behavior normal, alert       Assessment & Plan:  Assessment and Plan    Shortness of breath Worsening symptoms, particularly nocturnal and during exertion. Potential cardiac etiology due to age and risk factors. Cardiology evaluation required. - Message cardiologist about symptoms for appropriate testing. - Advise against exercise until cardiac evaluation. - If cardiac tests normal, resume regular walking and exercise.  Diabetes Mellitus Inconsistent medication adherence, especially during stress  and depression. Acknowledged need for improved self-care. - Encourage consistent medication adherence. - Discuss use of pill box for medication organization.  Sinus congestion Three-week duration with nasal stuffiness, post-nasal drip, and clear discharge. Limited relief from current medications. Likely environmental cause. - Prescribe nasal spray with over-the-counter loratadine . - Monitor symptoms and send MyChart message if no improvement in 2-3 weeks.  General Health Maintenance Efforts to improve diet, physical activity, social interactions, and emotional well-being. - Encourage healthy eating and gradual increase in physical activity.  Follow-up Cardiology appointment scheduled for further evaluation. Blood work planned before follow-up visit later in the year. - Ensure follow-up with cardiologist on scheduled date. - Plan follow-up visit later in the year with pre-visit blood work.     . I do not feel patient has unstable angina.  With her underlying diabetes and relatively sedentary lifestyle her shortness of breath with activity could be deconditioning but it is also possible that there could be underlying heart disease given her diabetes and it may be giving atypical symptoms.  I would recommend an echo to look at your her left ejection fraction as well as perhaps stress Myoview versus CTA to make sure she is not developing significant coronary artery disease patient will be seeing cardiology in the near future we will send a brief note giving them a heads up regarding this

## 2023-08-06 NOTE — Telephone Encounter (Signed)
 Patient forgot to ask if she needed labs before her next visit in December

## 2023-08-10 NOTE — Telephone Encounter (Signed)
 Nurses-please order A1c, lipid, liver, metabolic 7, urine ACR Diabetes, hyperlipidemia, high risk med, patient to do these lab work in December before her follow-up visit thank you

## 2023-08-11 ENCOUNTER — Other Ambulatory Visit: Payer: Self-pay

## 2023-08-11 DIAGNOSIS — E1169 Type 2 diabetes mellitus with other specified complication: Secondary | ICD-10-CM

## 2023-08-11 DIAGNOSIS — E119 Type 2 diabetes mellitus without complications: Secondary | ICD-10-CM

## 2023-08-11 DIAGNOSIS — Z79899 Other long term (current) drug therapy: Secondary | ICD-10-CM

## 2023-08-12 ENCOUNTER — Other Ambulatory Visit: Payer: Self-pay

## 2023-08-12 ENCOUNTER — Telehealth: Payer: Self-pay | Admitting: Cardiology

## 2023-08-12 ENCOUNTER — Ambulatory Visit: Attending: Cardiology | Admitting: Cardiology

## 2023-08-12 ENCOUNTER — Encounter: Payer: Self-pay | Admitting: Cardiology

## 2023-08-12 VITALS — BP 158/98 | HR 68 | Resp 16 | Ht 65.0 in | Wt 207.0 lb

## 2023-08-12 DIAGNOSIS — I10 Essential (primary) hypertension: Secondary | ICD-10-CM | POA: Diagnosis not present

## 2023-08-12 DIAGNOSIS — R072 Precordial pain: Secondary | ICD-10-CM | POA: Diagnosis not present

## 2023-08-12 DIAGNOSIS — R0609 Other forms of dyspnea: Secondary | ICD-10-CM | POA: Diagnosis not present

## 2023-08-12 MED ORDER — SPIRONOLACTONE 25 MG PO TABS: 25.0000 mg | ORAL_TABLET | Freq: Every day | ORAL | 3 refills | Status: AC

## 2023-08-12 NOTE — Patient Instructions (Signed)
 Medication Instructions:  START Spironolactone 25 mg daily *If you need a refill on your cardiac medications before your next appointment, please call your pharmacy*  Lab Work: ProBNP today  BMP and ProBNP in one week If you have labs (blood work) drawn today and your tests are completely normal, you will receive your results only by: MyChart Message (if you have MyChart) OR A paper copy in the mail If you have any lab test that is abnormal or we need to change your treatment, we will call you to review the results.  Testing/Procedures: Echocardiogram Your physician has requested that you have an echocardiogram. Echocardiography is a painless test that uses sound waves to create images of your heart. It provides your doctor with information about the size and shape of your heart and how well your heart's chambers and valves are working. This procedure takes approximately one hour. There are no restrictions for this procedure. Please do NOT wear cologne, perfume, aftershave, or lotions (deodorant is allowed). Please arrive 15 minutes prior to your appointment time.  Please note: We ask at that you not bring children with you during ultrasound (echo/ vascular) testing. Due to room size and safety concerns, children are not allowed in the ultrasound rooms during exams. Our front office staff cannot provide observation of children in our lobby area while testing is being conducted. An adult accompanying a patient to their appointment will only be allowed in the ultrasound room at the discretion of the ultrasound technician under special circumstances. We apologize for any inconvenience.   Follow-Up: At [facility], you and your health needs are our priority.  As part of our continuing mission to provide you with exceptional heart care, our providers are all part of one team.  This team includes your primary Cardiologist (physician) and Advanced Practice Providers or APPs (Physician  Assistants and Nurse Practitioners) who all work together to provide you with the care you need, when you need it.  Your next appointment:   AS SCHEDULED  Provider:   Newman JINNY Lawrence, MD    We recommend signing up for the patient portal called MyChart.  Sign up information is provided on this After Visit Summary.  MyChart is used to connect with patients for Virtual Visits (Telemedicine).  Patients are able to view lab/test results, encounter notes, upcoming appointments, etc.  Non-urgent messages can be sent to your provider as well.   To learn more about what you can do with MyChart, go to [email or web address].

## 2023-08-12 NOTE — Telephone Encounter (Signed)
 Spoke to Dr. [place] okay for pt to take spironolactone and valsartan . Pharmacy contacted and advised.

## 2023-08-12 NOTE — Progress Notes (Signed)
 Cardiology Office Note:  .   Date:  08/12/2023  ID:  [name], DOB Sep 13, 1965, MRN [id number] PCP: [name], MD  [facility] Providers Cardiologist:  Newman Lawrence, MD PCP: [name], MD  Chief Complaint  Patient presents with   Chest Pain   New Patient (Initial Visit)     [name] is a 58 y.o. female with hypertension, hyperlipidemia, type 2 diabetes mellitus, dyspnea on exertion  History of Present Illness [name] is a 58 year old female with hypertension, hyperlipidemia, and diabetes who presents with shortness of breath. She was referred by Dr. Alphonsa for evaluation of her shortness of breath.  She experiences progressively worsening shortness of breath over the past six months, occurring at night and with minimal exertion. Episodes at night wake her, requiring her to sit up and take deep breaths. She also experiences shortness of breath with minimal exertion, such as walking short distances, necessitating frequent pauses. A persistent cough is present. No leg swelling or significant fluid retention is noted.  Her medical history includes hypertension, hyperlipidemia, and diabetes, with stable but slightly elevated blood pressure. She manages her diabetes with glipizide , which is well controlled.  Family history is significant for her mother having had a stroke and subsequent heart disease. She does not smoke or consume alcohol or caffeine and engages in minimal physical activity.      Vitals:   08/12/23 1139 08/12/23 1201  BP: (!) 176/102 (!) 158/98  Pulse: 68   Resp: 16   SpO2: 96%       Review of Systems  Cardiovascular:  Positive for dyspnea on exertion, orthopnea and paroxysmal nocturnal dyspnea. Negative for chest pain, leg swelling, palpitations and syncope.        Studies Reviewed: SABRA        EKG 08/12/2023: Normal sinus rhythm Normal ECG When compared with ECG of 16-Apr-2018 12:35, No significant  change was found    Labs 04/2023: Chol 132, TG 63, HDL 47, LDL 72 HbA1C 6.6% Hb 12.2 Cr 0.7 TSH 0.7   Physical Exam Vitals and nursing note reviewed.  Constitutional:      General: She is not in acute distress. Neck:     Vascular: No JVD.  Cardiovascular:     Rate and Rhythm: Normal rate and regular rhythm.     Heart sounds: Normal heart sounds. No murmur heard. Pulmonary:     Effort: Pulmonary effort is normal.     Breath sounds: Normal breath sounds. No wheezing or rales.  Musculoskeletal:     Right lower leg: No edema.     Left lower leg: No edema.      VISIT DIAGNOSES:   ICD-10-CM   1. Precordial pain  R07.2 EKG 12-Lead    2. DOE (dyspnea on exertion)  R06.09 Pro b natriuretic peptide (BNP)    Basic metabolic panel with GFR    ECHOCARDIOGRAM COMPLETE    Pro b natriuretic peptide (BNP)    3. Primary hypertension  I10        [name] is a 58 y.o. female with hypertension, hyperlipidemia, type 2 diabetes mellitus, dyspnea on exertion Assessment & Plan Dyspnea on exertion: Along with orthopnea and PND symptoms, concerning for congestive heart failure, although physical exam is unremarkable.  Precordial pain is only present during her episodes of orthopnea, not otherwise. Uncontrolled hypertension could be contributing. Recommend echocardiogram, check proBNP. Start spironolactone 25 mg daily, repeat BMP and proBNP in 1  week. Okay to continue current antihypertensive medications, including valsartan  and amlodipine .  Avoid excessive potassium intake, such as bananas.  Avoid high salt intake.  Counseled patient regarding avoiding hidden salt, such as in canned food, fried food, chips etc.  If chest pain persist in spite of resolution of dyspnea, orthopnea, PND symptoms, could consider ischemia testing.  Hypertension: Uncontrolled.  Blood pressure was lower than this at recent PCP visit.  Hopefully, addition of spironolactone will help.    Type 2  diabetes mellitus without complications: Well controlled with glipizide .  Hyperlipidemia: Well-controlled on Lipitor.    Meds ordered this encounter  Medications   spironolactone (ALDACTONE) 25 MG tablet    Sig: Take 1 tablet (25 mg total) by mouth daily.    Dispense:  90 tablet    Refill:  3     F/u in 4-6 weeks  Signed, Newman JINNY Lawrence, MD

## 2023-08-12 NOTE — Telephone Encounter (Signed)
 Pt c/o medication issue:  1. Name of Medication: spironolactone (ALDACTONE) 25 MG tablet   2. How are you currently taking this medication (dosage and times per day)?    3. Are you having a reaction (difficulty breathing--STAT)? no  4. What is your medication issue? Calling to see if the dr is aware that, there is drug interaction with this medication. Please advise

## 2023-08-13 ENCOUNTER — Ambulatory Visit: Payer: Self-pay | Admitting: Cardiology

## 2023-08-13 LAB — PRO B NATRIURETIC PEPTIDE: NT-Pro BNP: <36 pg/mL (ref 0–287)

## 2023-08-14 ENCOUNTER — Emergency Department (HOSPITAL_COMMUNITY)
Admission: EM | Admit: 2023-08-14 | Discharge: 2023-08-14 | Disposition: A | Discharge status: Home / Self Care | Attending: Emergency Medicine | Admitting: Emergency Medicine

## 2023-08-14 ENCOUNTER — Encounter (HOSPITAL_COMMUNITY): Payer: Self-pay | Admitting: *Deleted

## 2023-08-14 ENCOUNTER — Other Ambulatory Visit: Payer: Self-pay

## 2023-08-14 DIAGNOSIS — R55 Syncope and collapse: Secondary | ICD-10-CM | POA: Diagnosis not present

## 2023-08-14 DIAGNOSIS — I10 Essential (primary) hypertension: Secondary | ICD-10-CM | POA: Diagnosis not present

## 2023-08-14 DIAGNOSIS — Z794 Long term (current) use of insulin: Secondary | ICD-10-CM | POA: Insufficient documentation

## 2023-08-14 DIAGNOSIS — E119 Type 2 diabetes mellitus without complications: Secondary | ICD-10-CM | POA: Diagnosis not present

## 2023-08-14 DIAGNOSIS — Z7984 Long term (current) use of oral hypoglycemic drugs: Secondary | ICD-10-CM | POA: Diagnosis not present

## 2023-08-14 DIAGNOSIS — Z79899 Other long term (current) drug therapy: Secondary | ICD-10-CM | POA: Diagnosis not present

## 2023-08-14 DIAGNOSIS — F430 Acute stress reaction: Secondary | ICD-10-CM | POA: Diagnosis not present

## 2023-08-14 DIAGNOSIS — F410 Panic disorder [episodic paroxysmal anxiety] without agoraphobia: Secondary | ICD-10-CM | POA: Diagnosis not present

## 2023-08-14 DIAGNOSIS — R064 Hyperventilation: Secondary | ICD-10-CM | POA: Insufficient documentation

## 2023-08-14 LAB — URINALYSIS, ROUTINE W REFLEX MICROSCOPIC
Bilirubin Urine: NEGATIVE
Glucose, UA: NEGATIVE mg/dL
Hgb urine dipstick: NEGATIVE
Ketones, ur: NEGATIVE mg/dL
Leukocytes,Ua: NEGATIVE
Nitrite: NEGATIVE
Protein, ur: 100 mg/dL — AB
Specific Gravity, Urine: 1.006 (ref 1.005–1.030)
pH: 7 (ref 5.0–8.0)

## 2023-08-14 LAB — COMPREHENSIVE METABOLIC PANEL WITH GFR
ALT: 13 U/L (ref 0–44)
AST: 19 U/L (ref 15–41)
Albumin: 4.2 g/dL (ref 3.5–5.0)
Alkaline Phosphatase: 90 U/L (ref 38–126)
Anion gap: 15 (ref 5–15)
BUN: 14 mg/dL (ref 6–20)
CO2: 22 mmol/L (ref 22–32)
Calcium: 9.7 mg/dL (ref 8.9–10.3)
Chloride: 101 mmol/L (ref 98–111)
Creatinine, Ser: 0.75 mg/dL (ref 0.44–1.00)
GFR, Estimated: >60 mL/min (ref >60)
Glucose, Bld: 124 mg/dL — ABNORMAL HIGH (ref 70–99)
Potassium: 3.3 mmol/L — ABNORMAL LOW (ref 3.5–5.1)
Sodium: 138 mmol/L (ref 135–145)
Total Bilirubin: 0.8 mg/dL (ref 0.0–1.2)
Total Protein: 8.2 g/dL — ABNORMAL HIGH (ref 6.5–8.1)

## 2023-08-14 LAB — BLOOD GAS, VENOUS
Acid-Base Excess: 1.4 mmol/L (ref 0.0–2.0)
Bicarbonate: 24.9 mmol/L (ref 20.0–28.0)
Drawn by: 27160
O2 Saturation: 79.6 %
Patient temperature: 37.1
pCO2, Ven: 35 mmHg — ABNORMAL LOW (ref 44–60)
pH, Ven: 7.46 — ABNORMAL HIGH (ref 7.25–7.43)
pO2, Ven: 46 mmHg — ABNORMAL HIGH (ref 32–45)

## 2023-08-14 LAB — CBC WITH DIFFERENTIAL/PLATELET
Abs Immature Granulocytes: 0.01 K/uL (ref 0.00–0.07)
Basophils Absolute: 0 K/uL (ref 0.0–0.1)
Basophils Relative: 0 %
Eosinophils Absolute: 0.1 K/uL (ref 0.0–0.5)
Eosinophils Relative: 1 %
HCT: 41.9 % (ref 36.0–46.0)
Hemoglobin: 13.8 g/dL (ref 12.0–15.0)
Immature Granulocytes: 0 %
Lymphocytes Relative: 27 %
Lymphs Abs: 1.6 K/uL (ref 0.7–4.0)
MCH: 28.6 pg (ref 26.0–34.0)
MCHC: 32.9 g/dL (ref 30.0–36.0)
MCV: 86.9 fL (ref 80.0–100.0)
Monocytes Absolute: 0.5 K/uL (ref 0.1–1.0)
Monocytes Relative: 8 %
Neutro Abs: 3.7 K/uL (ref 1.7–7.7)
Neutrophils Relative %: 64 %
Platelets: 287 K/uL (ref 150–400)
RBC: 4.82 MIL/uL (ref 3.87–5.11)
RDW: 13.5 % (ref 11.5–15.5)
WBC: 5.9 K/uL (ref 4.0–10.5)
nRBC: 0 % (ref 0.0–0.2)

## 2023-08-14 LAB — TROPONIN I (HIGH SENSITIVITY)
Troponin I (High Sensitivity): 3 ng/L (ref <18)
Troponin I (High Sensitivity): 3 ng/L (ref <18)

## 2023-08-14 MED ORDER — METOCLOPRAMIDE HCL 5 MG/ML IJ SOLN
10.0000 mg | Freq: Once | INTRAMUSCULAR | Status: AC
  Administered 2023-08-14: 10 mg via INTRAVENOUS
  Filled 2023-08-14: qty 2

## 2023-08-14 MED ORDER — LORAZEPAM 2 MG/ML IJ SOLN
1.0000 mg | Freq: Once | INTRAMUSCULAR | Status: AC
  Administered 2023-08-14: 1 mg via INTRAVENOUS

## 2023-08-14 MED ORDER — KETOROLAC TROMETHAMINE 15 MG/ML IJ SOLN
15.0000 mg | Freq: Once | INTRAMUSCULAR | Status: AC
  Administered 2023-08-14: 15 mg via INTRAVENOUS
  Filled 2023-08-14: qty 1

## 2023-08-14 MED ORDER — HYDROXYZINE HCL 25 MG PO TABS: 25.0000 mg | ORAL_TABLET | Freq: Four times a day (QID) | ORAL | 0 refills | Status: DC

## 2023-08-14 MED ORDER — LORAZEPAM 2 MG/ML IJ SOLN
INTRAMUSCULAR | Status: AC
  Filled 2023-08-14: qty 1

## 2023-08-14 MED ORDER — LACTATED RINGERS IV BOLUS
1000.0000 mL | Freq: Once | INTRAVENOUS | Status: AC
  Administered 2023-08-14: 1000 mL via INTRAVENOUS

## 2023-08-14 MED ORDER — ACETAMINOPHEN 500 MG PO TABS: 1000.0000 mg | ORAL_TABLET | Freq: Once | ORAL | Status: DC

## 2023-08-14 MED ORDER — ONDANSETRON HCL 4 MG/2ML IJ SOLN
4.0000 mg | Freq: Once | INTRAMUSCULAR | Status: AC
  Administered 2023-08-14: 4 mg via INTRAVENOUS
  Filled 2023-08-14: qty 2

## 2023-08-14 NOTE — ED Triage Notes (Addendum)
 Family brought pt with LOC, pt was in back seat, pt breathing, not responding but to painful stimuli, pulled out of car onto a stretcher. Family states  pt has had a lot of stress and possible panic attack.

## 2023-08-14 NOTE — ED Notes (Signed)
 Pt stated, I feel a lot better than I did.

## 2023-08-14 NOTE — ED Provider Notes (Signed)
 [place] EMERGENCY DEPARTMENT AT [facility] Provider Note   CSN: [id number] Arrival date & time: 08/14/23  1532     History {Add pertinent medical, surgical, social history, OB history to HPI:1} Chief Complaint  Patient presents with   Loss of Consciousness    [name] is a 58 y.o. female with PMH as listed below who presents brought in by family for loss of consciousness.  Patient's niece is getting married today and patient has been very stressed out about all the preparations for the ceremony.  The ceremony was outside and it is ~90 degrees F. Patient's family states that she hasn't eaten anything today, was feeling anxious, began hyperventilating, and lost consciousness in the back of their car. She was also complaining of nausea. She did fall down but did not hit her head. On arrival to ED patient is mildly somnolent and hyperventilating, responsive to questions and commands. She denies pain. States this has never happened to her before. States she still feels lightheaded.   Vitals on arrival BP 145/83, HR 95 bpm, R 26, T 98.8 axillary, satting 100%, CBG 83 mg/dL.    Past Medical History:  Diagnosis Date   Allergy    Anxiety    Arthritis    Chronic cough    Chronic low back pain    Chronic pain syndrome    Depression    Diabetes mellitus without complication (HCC)    Folliculitis 07/20/2012   GERD (gastroesophageal reflux disease)    Hidradenitis suppurativa    High cholesterol    Hypertension    IBS (irritable bowel syndrome)    Lumbar spondylolysis    Migraine    Migraine headache    Pulmonary nodule    Reactive airway disease 01/2018   per PFT's   Sleep apnea        Home Medications Prior to Admission medications   Medication Sig Start Date End Date Taking? Authorizing Provider  albuterol  (VENTOLIN  HFA) 108 (90 Base) MCG/ACT inhaler Inhale 2 puffs into the lungs every 4 (four) hours as needed. 04/30/22   [name], MD  amLODipine   (NORVASC ) 5 MG tablet Take 1 tablet (5 mg total) by mouth daily. 08/06/23   [name], MD  Ascorbic Acid (VITAMIN C PO) Take 250 mg by mouth.    [provider]  atorvastatin  (LIPITOR) 10 MG tablet One Monday weds and friday 08/06/23   [name], MD  azelastine  (ASTELIN ) 0.1 % nasal spray Place 2 sprays into both nostrils 2 (two) times daily. 08/06/23   [name], MD  blood glucose meter kit and supplies Dispense based on patient and insurance preference. Use to test sugar once a day. For dx E11.9 02/28/19   Mauro Elveria BROCKS, NP  fluticasone  (FLONASE ) 50 MCG/ACT nasal spray Place 2 sprays into both nostrils daily as needed for allergies or rhinitis. 04/30/22   [name], MD  glipiZIDE  (GLUCOTROL  XL) 2.5 MG 24 hr tablet Take 1 tablet daily 08/06/23   Luking, Scott A, MD  Insulin Pen Needle (RELION MINI PEN NEEDLES) 31G X 6 MM MISC USE AS DIRECTED WITH VICTOZA  PEN. 02/23/23   Alphonsa, Glendia DELENA, MD  Lancets (ONETOUCH DELICA PLUS LANCET33G) MISC USE 1 LANCET TO CHECK GLUCOSE ONCE DAILY 02/11/23   [name], MD  Multiple Vitamins-Minerals (MULTIPLE VITAMINS/WOMENS PO) Take by mouth.    [provider]  ONETOUCH VERIO test strip USE 1 STRIP TO CHECK GLUCOSE ONCE DAILY 01/21/21  Alphonsa Glendia LABOR, MD  pantoprazole  (PROTONIX ) 40 MG tablet Take 1 tablet (40 mg total) by mouth daily. 08/06/23   Alphonsa Glendia LABOR, MD  sertraline  (ZOLOFT ) 100 MG tablet TAKE 1 & 1/2 (ONE & ONE-HALF) TABLETS BY MOUTH ONCE DAILY 08/06/23   Alphonsa Glendia LABOR, MD  spironolactone  (ALDACTONE ) 25 MG tablet Take 1 tablet (25 mg total) by mouth daily. 08/12/23   Patwardhan, Newman PARAS, MD  topiramate  (TOPAMAX ) 50 MG tablet TAKE 1 TABLET BY MOUTH AT BEDTIME FOR MIGRAINE HEADACHE 08/06/23   Alphonsa Glendia LABOR, MD  valsartan  (DIOVAN ) 80 MG tablet Take 1 tablet by mouth once daily 04/12/23   Alphonsa Glendia LABOR, MD  VITAMIN D  PO Take by mouth.    [provider]      Allergies    Bactrim   [sulfamethoxazole -trimethoprim ], Lisinopril , Other, Metformin and related, and Ozempic  (0.25 or 0.5 mg-dose) [semaglutide (0.25 or 0.5mg -dos)]    Review of Systems   Review of Systems A 10 point review of systems was performed and is negative unless otherwise reported in HPI.  Physical Exam Updated Vital Signs BP (!) 145/83 (BP Location: Right Arm)   Pulse 93   Temp 98.8 F (37.1 C) (Axillary)   Resp (!) 26   Ht 5' 5 (1.651 m)   Wt 93.9 kg   SpO2 100%   BMI 34.45 kg/m  Physical Exam General: Patient mildly somnolent but also very anxious, hyperventilating, mildly tremulous, lying on the bed with eyes closed HEENT: NCAT, PERRLA, EOMI, Sclera anicteric, MMM, trachea midline.  Cardiology: RRR, no murmurs/rubs/gallops.  Resp: Tachypneic. CTAB, no wheezes, rhonchi, crackles.  Abd: Soft, non-tender, non-distended. No rebound tenderness or guarding.  GU: Deferred. MSK: No peripheral edema or signs of trauma. Extremities without deformity or TTP. No cyanosis or clubbing. Skin: warm, dry. Neuro: A&Ox4, CNs II-XII grossly intact. 5/5 strength all extremities. Sensation grossly intact. Whispering to communicate.   ED Results / Procedures / Treatments   Labs (all labs ordered are listed, but only abnormal results are displayed) Labs Reviewed  CBC WITH DIFFERENTIAL/PLATELET  COMPREHENSIVE METABOLIC PANEL WITH GFR  URINALYSIS, ROUTINE W REFLEX MICROSCOPIC  BLOOD GAS, VENOUS  TROPONIN I (HIGH SENSITIVITY)    EKG None  Radiology No results found.  Procedures Procedures  {Document cardiac monitor, telemetry assessment procedure when appropriate:1}  Medications Ordered in ED Medications  lactated ringers  bolus 1,000 mL (has no administration in time range)  LORazepam  (ATIVAN ) 2 MG/ML injection (has no administration in time range)  LORazepam  (ATIVAN ) injection 1 mg (1 mg Intravenous Given 08/14/23 1545)  ondansetron  (ZOFRAN ) injection 4 mg (4 mg Intravenous Given 08/14/23 1545)     ED Course/ Medical Decision Making/ A&P                          Medical Decision Making Risk Prescription drug management.    This patient presents to the ED for concern of ***, this involves an extensive number of treatment options, and is a complaint that carries with it a high risk of complications and morbidity.  I considered the following differential and admission for this acute, potentially life threatening condition.   MDM:    DDX for syncope includes but is not limited to:  Consider anemia and electrolyte abnormalities as possible etiologies. Consider arrhythmias and ACS, although without associated symptoms, less likely. Consider hemorrhage vs CVA, although neuro intact now for 24 hours and no associated other symptoms. Consider mets given known underlying cancer. Consider  PE as well, although without hypoxia or sob. Consider broad differential.  *** Given history, exam and workup, low suspicion for HF, ICH (no trauma, headache), seizure (no witnessed seizure like activity, no postictal period, tongue laceration, bladder incontinence), stroke (no focal neuro deficits), HOCM (no murmur, family history of sudden death), ACS (neg troponin, no anginal pain), aortic dissection (no chest pain), malignant arrhythmia on ekg or any family history of sudden death, or GI bleed (stable hgb). Low suspicion for PE given normal vital signs, absence of chest pain or dyspnea, no evidence of DVT, no recent surgery/immobilization. Based on canadian syncope rule, patient is low risk and well appearing here, plan to discharge the patient home with PMD follow up.      Labs: I Ordered, and personally interpreted labs.  The pertinent results include:  those listed above  Imaging Studies ordered: I ordered imaging studies including *** I independently visualized and interpreted imaging. I agree with the radiologist interpretation  Additional history obtained from chart review, family at  bedside.  Cardiac Monitoring: The patient was maintained on a cardiac monitor.  I personally viewed and interpreted the cardiac monitored which showed an underlying rhythm of: NSR  Reevaluation: After the interventions noted above, I reevaluated the patient and found that they have :improved  Social Determinants of Health: Lives independently  Disposition:  ***  Co morbidities that complicate the patient evaluation  Past Medical History:  Diagnosis Date   Allergy    Anxiety    Arthritis    Chronic cough    Chronic low back pain    Chronic pain syndrome    Depression    Diabetes mellitus without complication (HCC)    Folliculitis 07/20/2012   GERD (gastroesophageal reflux disease)    Hidradenitis suppurativa    High cholesterol    Hypertension    IBS (irritable bowel syndrome)    Lumbar spondylolysis    Migraine    Migraine headache    Pulmonary nodule    Reactive airway disease 01/2018   per PFT's   Sleep apnea      Medicines Meds ordered this encounter  Medications   LORazepam  (ATIVAN ) injection 1 mg   lactated ringers  bolus 1,000 mL   ondansetron  (ZOFRAN ) injection 4 mg   LORazepam  (ATIVAN ) 2 MG/ML injection    Eanes, Morgan P: cabinet override    I have reviewed the patients home medicines and have made adjustments as needed  Problem List / ED Course: Problem List Items Addressed This Visit   None        {Document critical care time when appropriate:1} {Document review of labs and clinical decision tools ie heart score, Chads2Vasc2 etc:1}  {Document your independent review of radiology images, and any outside records:1} {Document your discussion with family members, caretakers, and with consultants:1} {Document social determinants of health affecting pt's care:1} {Document your decision making why or why not admission, treatments were needed:1}  This note was created using dictation software, which may contain spelling or grammatical errors.

## 2023-08-14 NOTE — ED Notes (Signed)
 Pt po challenged per EDP request. Challenge was successful pt denies nausea or vomiting episode.

## 2023-08-14 NOTE — Discharge Instructions (Signed)
 Thank you for coming to [name] Emergency Department. You were seen for passing out, panic attack. We did an exam, labs, and imaging, and these showed no acute findings.   We have prescribed hydroxyzine  25 mg to take every 6 hours as needed for anxiety or panic. If you find you are taking this every day, please discuss with your primary doctor about a daily medication for anxiety. Additionally, therapy and medication work better together than either one alone.  Please follow up with your primary care provider within 1 week.   Do not hesitate to return to the ED or call 911 if you experience: -Worsening symptoms -Chest pain, shortness of breath -Lightheadedness, passing out -Fevers/chills -Anything else that concerns you

## 2023-08-14 NOTE — ED Notes (Signed)
 EDP at bedside

## 2023-08-14 NOTE — ED Notes (Signed)
 Pt began to scream and thrash in the bed in what appeared to be an episode of increased anxiety/panic. She was able to be calmed by EDP and family.

## 2023-08-14 NOTE — ED Notes (Signed)
Pt given ice pack for migraine.

## 2023-08-15 LAB — I-STAT CHEM 8, ED
BUN: 14 mg/dL (ref 6–20)
Calcium, Ion: 1.09 mmol/L — ABNORMAL LOW (ref 1.15–1.40)
Chloride: 102 mmol/L (ref 98–111)
Creatinine, Ser: 0.8 mg/dL (ref 0.44–1.00)
Glucose, Bld: 130 mg/dL — ABNORMAL HIGH (ref 70–99)
HCT: 44 % (ref 36.0–46.0)
Hemoglobin: 15 g/dL (ref 12.0–15.0)
Potassium: 3.3 mmol/L — ABNORMAL LOW (ref 3.5–5.1)
Sodium: 139 mmol/L (ref 135–145)
TCO2: 22 mmol/L (ref 22–32)

## 2023-08-16 DIAGNOSIS — M170 Bilateral primary osteoarthritis of knee: Secondary | ICD-10-CM | POA: Diagnosis not present

## 2023-08-16 LAB — CBG MONITORING, ED: Glucose-Capillary: 83 mg/dL (ref 70–99)

## 2023-08-18 ENCOUNTER — Ambulatory Visit

## 2023-08-18 DIAGNOSIS — E119 Type 2 diabetes mellitus without complications: Secondary | ICD-10-CM

## 2023-08-18 LAB — HM DIABETES EYE EXAM

## 2023-08-18 NOTE — Progress Notes (Unsigned)
 [name] arrived 08/18/2023 and has given verbal consent to obtain images and complete their overdue diabetic retinal screening.  The images have been sent to an ophthalmologist or optometrist for review and interpretation.  Results will be sent back to [name], MD for review.  Patient has been informed they will be contacted when we receive the results via telephone or MyChart

## 2023-08-23 DIAGNOSIS — R0609 Other forms of dyspnea: Secondary | ICD-10-CM | POA: Diagnosis not present

## 2023-08-24 ENCOUNTER — Ambulatory Visit: Payer: Self-pay | Admitting: Family Medicine

## 2023-08-24 LAB — BASIC METABOLIC PANEL WITH GFR
BUN/Creatinine Ratio: 24 — ABNORMAL HIGH (ref 9–23)
BUN/Creatinine Ratio: 24 — ABNORMAL HIGH (ref 9–23)
BUN: 18 mg/dL (ref 6–24)
BUN: 18 mg/dL (ref 6–24)
CO2: 23 mmol/L (ref 20–29)
CO2: 23 mmol/L (ref 20–29)
Calcium: 9.6 mg/dL (ref 8.7–10.2)
Calcium: 9.6 mg/dL (ref 8.7–10.2)
Chloride: 101 mmol/L (ref 96–106)
Chloride: 100 mmol/L (ref 96–106)
Creatinine, Ser: 0.74 mg/dL (ref 0.57–1.00)
Creatinine, Ser: 0.75 mg/dL (ref 0.57–1.00)
Glucose: 104 mg/dL — ABNORMAL HIGH (ref 70–99)
Glucose: 102 mg/dL — ABNORMAL HIGH (ref 70–99)
Potassium: 4.1 mmol/L (ref 3.5–5.2)
Potassium: 4.1 mmol/L (ref 3.5–5.2)
Sodium: 140 mmol/L (ref 134–144)
Sodium: 140 mmol/L (ref 134–144)
eGFR: 93 mL/min/1.73 (ref >59)
eGFR: 92 mL/min/1.73 (ref >59)

## 2023-08-24 LAB — HEPATIC FUNCTION PANEL
ALT: 10 IU/L (ref 0–32)
AST: 15 IU/L (ref 0–40)
Albumin: 4.6 g/dL (ref 3.8–4.9)
Alkaline Phosphatase: 103 IU/L (ref 44–121)
Bilirubin Total: 0.3 mg/dL (ref 0.0–1.2)
Bilirubin, Direct: 0.14 mg/dL (ref 0.00–0.40)
Total Protein: 7.5 g/dL (ref 6.0–8.5)

## 2023-08-24 LAB — LIPID PANEL
Chol/HDL Ratio: 3 ratio (ref 0.0–4.4)
Cholesterol, Total: 146 mg/dL (ref 100–199)
HDL: 49 mg/dL (ref >39)
LDL Chol Calc (NIH): 85 mg/dL (ref 0–99)
Triglycerides: 56 mg/dL (ref 0–149)
VLDL Cholesterol Cal: 12 mg/dL (ref 5–40)

## 2023-08-24 LAB — HEMOGLOBIN A1C
Est. average glucose Bld gHb Est-mCnc: 137 mg/dL
Hgb A1c MFr Bld: 6.4 % — ABNORMAL HIGH (ref 4.8–5.6)

## 2023-08-24 LAB — MICROALBUMIN / CREATININE URINE RATIO
Creatinine, Urine: 81.9 mg/dL
Microalb/Creat Ratio: 59 mg/g{creat} — ABNORMAL HIGH (ref 0–29)
Microalbumin, Urine: 48.3 ug/mL

## 2023-08-24 LAB — PRO B NATRIURETIC PEPTIDE: NT-Pro BNP: 62 pg/mL (ref 0–287)

## 2023-08-26 ENCOUNTER — Other Ambulatory Visit: Payer: Self-pay

## 2023-08-26 MED ORDER — ATORVASTATIN CALCIUM 10 MG PO TABS: ORAL_TABLET | ORAL | 6 refills | Status: AC

## 2023-08-27 ENCOUNTER — Ambulatory Visit: Admitting: Nurse Practitioner

## 2023-08-27 ENCOUNTER — Encounter: Payer: Self-pay | Admitting: Nurse Practitioner

## 2023-08-27 VITALS — BP 158/88 | HR 64 | Temp 97.7°F | Ht 65.0 in | Wt 205.4 lb

## 2023-08-27 DIAGNOSIS — R55 Syncope and collapse: Secondary | ICD-10-CM

## 2023-08-27 DIAGNOSIS — F32A Depression, unspecified: Secondary | ICD-10-CM | POA: Diagnosis not present

## 2023-08-27 DIAGNOSIS — F419 Anxiety disorder, unspecified: Secondary | ICD-10-CM | POA: Diagnosis not present

## 2023-08-27 DIAGNOSIS — M26622 Arthralgia of left temporomandibular joint: Secondary | ICD-10-CM

## 2023-08-27 DIAGNOSIS — M62838 Other muscle spasm: Secondary | ICD-10-CM | POA: Diagnosis not present

## 2023-08-27 MED ORDER — HYDROXYZINE HCL 25 MG PO TABS: ORAL_TABLET | ORAL | 0 refills | Status: DC

## 2023-08-27 NOTE — Progress Notes (Addendum)
 Subjective:    Patient ID: [name], female    DOB: 04/18/1965, 58 y.o.   MRN: [id number]  HPI Presents for follow-up after a hospital visit for a syncopal episode on 08/14/2023.  Patient was at her daughter's wedding trying to help with setting up.  Had several episodes of losing consciousness with hyperventilation.  See ED note dated 08/14/2023.  Based on syncope scale, patient is low risk.  No indication of other causes noted during that visit.  Today patient presents very upset about the entire incident stating it is the worst day of my life.  She is mainly upset because her daughter and 2 grandchildren did not come to check on her despite the fact her family had to take her to the hospital.  Stated 2 of her nieces 1 who is a nurse went with her to the hospital.  Very upset about the lack of concern with her immediate family.  States she does not remember a lot about the incident at that time.  Was under a lot of stress, was indoors at the time.  Family states she was not drinking or eating properly that day.  Denies suicidal or homicidal thoughts or ideation.  Denies any self-harm behaviors.  Note the patient has a cardiology appointment in August.  Has noticed pain in the left neck area going up into the left ear.  Slight dizziness at times.  No further syncopal episodes.  Dull generalized headache with tenderness to the scalp almost like her hair is hurting at times.  Some palpitations.  Was given hydroxyzine  at the ED visit which helps her severe anxiety.  Currently on sertraline  150 mg daily. Has a sister who has provided excellent support.  Review of Systems  Constitutional:  Positive for fatigue.  HENT:  Positive for congestion, ear pain, postnasal drip, sinus pressure and sore throat.   Respiratory:  Negative for cough, chest tightness and shortness of breath.        Mild shortness of breath noted with hyperventilation during panic attacks.  Cardiovascular:  Positive for  palpitations. Negative for chest pain.  Neurological:  Positive for light-headedness and headaches. Negative for speech difficulty.  Psychiatric/Behavioral:  Positive for dysphoric mood and sleep disturbance. Negative for self-injury and suicidal ideas. The patient is nervous/anxious.       08/27/2023   10:15 AM  Depression screen PHQ 2/9  Decreased Interest 3  Down, Depressed, Hopeless 3  PHQ - 2 Score 6  Altered sleeping 3  Tired, decreased energy 3  Change in appetite 3  Feeling bad or failure about yourself  2  Trouble concentrating 3  Moving slowly or fidgety/restless 3  Suicidal thoughts 0  PHQ-9 Score 23  Difficult doing work/chores Very difficult      08/27/2023   10:16 AM 08/06/2023    2:38 PM 04/30/2023    9:22 AM 04/27/2023   11:31 AM  GAD 7 : Generalized Anxiety Score  Nervous, Anxious, on Edge 3 3 2 2   Control/stop worrying 3 3 3 3   Worry too much - different things 3 3 3 3   Trouble relaxing 3 3 3 3   Restless 3 2 2 2   Easily annoyed or irritable 3 2 3 3   Afraid - awful might happen 0 0 0 0  Total GAD 7 Score 18 16 16 16   Anxiety Difficulty   Somewhat difficult Somewhat difficult    Social History   Tobacco Use   Smoking status: Never   Smokeless  tobacco: Never  Vaping Use   Vaping status: Never Used  Substance Use Topics   Alcohol use: No    Alcohol/week: 0.0 standard drinks of alcohol   Drug use: No        Objective:   Physical Exam NAD.  Alert, oriented.  Patient very upset during most of the visit with crying.  Speech clear.  Making good eye contact.  Normal judgment and behavior.  TMs mild clear effusion, no erythema.  Nasal mucosa pale and mildly boggy more on the left.  Pharynx not erythematous with cloudy PND noted.  Neck supple with mild soft anterior cervical adenopathy.  Extreme tightness and tenderness noted at the left TMJ along the left lateral neck area.  Also tightness noted on the right going into the trapezius.  Normal ROM of the neck.   Lungs clear.  Heart regular rate rhythm. Today's Vitals   08/27/23 1012 08/27/23 1017  BP: (!) 180/91 (!) 158/88  Pulse: 64   Temp: 97.7 F (36.5 C)   SpO2: 100%   Weight: 205 lb 6.4 oz (93.2 kg)   Height: 5' 5 (1.651 m)    Body mass index is 34.18 kg/m.        Assessment & Plan:   Problem List Items Addressed This Visit       Musculoskeletal and Integument   Muscle spasms of neck     Other   Anxiety and depression - Primary   Relevant Medications   hydrOXYzine  (ATARAX ) 25 MG tablet   TMJ tenderness, left   Vasovagal syncope   Meds ordered this encounter  Medications   hydrOXYzine  (ATARAX ) 25 MG tablet    Sig: Take 1/2-1 tablet every 6 hours as needed for anxiety    Dispense:  30 tablet    Refill:  0    Supervising Provider:   ALPHONSA HAMILTON A [[id number]]   Continue hydroxyzine  as directed, drowsiness precautions. Feel the 1 component were missing and her regimen is mental health counseling.  Continue sertraline  as directed. Patient sent information to contact [facility] counseling locally, let us  know if she needs assistance with a referral. Ice/heat to the neck area with stretching exercises.  OTC topicals as directed.  Massage to the TMJ and neck area.  Discussed the importance of getting her stress and anxiety under better control. Warning signs reviewed regarding syncopal episodes.  Follow-up with cardiology in August as planned.  Recheck here if no improvement in her symptoms.

## 2023-08-27 NOTE — Patient Instructions (Signed)
 Muscle Cramps and Spasms Muscle cramps and spasms happen when muscles tighten on their own. They can be in any muscle. They happen most often in the muscles in the back of your lower leg (calf). Muscle cramps are painful. They are often stronger and last longer than muscle spasms. Muscle spasms may or may not be painful. In many cases, the cause of a muscle cramp or spasm is not known. But it may be from: Doing more work or exercise than your body is ready for. Using the muscles too much (overuse). Staying in one position for too long. Not preparing enough or having bad form when you play a sport or do an activity. Getting hurt. Other causes may include: Not enough water in your body (dehydration). Taking certain medicines. Not having enough salts and minerals in the body (electrolytes). Follow these instructions at home: Eating and drinking Drink enough fluid to keep your pee (urine) pale yellow. Eat a healthy diet to help your muscles work well. Your diet should include: Fruits and vegetables. Lean protein. Whole grains. Low-fat or nonfat dairy products. Managing pain and stiffness     Massage, stretch, and relax the muscle. Do this for a few minutes at a time. If told, put ice on the muscle. This may help if you are sore or have pain after a cramp or spasm. Put ice in a plastic bag. Place a towel between your skin and the bag. Leave the ice on for 20 minutes, 2-3 times a day. If told, put heat on tight or tense muscles. Do this as often as told by your doctor. Use the heat source that your doctor recommends, such as a moist heat pack or a heating pad. Place a towel between your skin and the heat source. Leave the heat on for 20-30 minutes. If your skin turns bright red, take off the ice or heat right away to prevent skin damage. The risk of damage is higher if you cannot feel pain, heat, or cold. Take hot showers or baths to help relax the muscles. General instructions If you  are having cramps often, avoid intense exercise for a few days. Take over-the-counter and prescription medicines only as told by your doctor. Watch for any changes in your symptoms. Contact a doctor if: Your cramps or spasms get worse or happen more often. Your cramps or spasms do not get better with time. This information is not intended to replace advice given to you by your health care provider. Make sure you discuss any questions you have with your health care provider. Document Revised: 09/16/2021 Document Reviewed: 09/16/2021 Elsevier Patient Education  2024 [employer].Neck Exercises Ask your health care provider which exercises are safe for you. Do exercises exactly as told by your health care provider and adjust them as directed. It is normal to feel mild stretching, pulling, tightness, or discomfort as you do these exercises. Stop right away if you feel sudden pain or your pain gets worse. Do not begin these exercises until told by your health care provider. Neck exercises can be important for many reasons. They can improve strength and maintain flexibility in your neck, which will help your upper back and prevent neck pain. Stretching exercises Rotation neck stretching  Sit in a chair or stand up. Place your feet flat on the floor, shoulder-width apart. Slowly turn your head (rotate) to the right until a slight stretch is felt. Turn it all the way to the right so you can look over your right  shoulder. Do not tilt or tip your head. Hold this position for 10-30 seconds. Slowly turn your head (rotate) to the left until a slight stretch is felt. Turn it all the way to the left so you can look over your left shoulder. Do not tilt or tip your head. Hold this position for 10-30 seconds. Repeat __________ times. Complete this exercise __________ times a day. Neck retraction  Sit in a sturdy chair or stand up. Look straight ahead. Do not bend your neck. Use your fingers to push your chin  backward (retraction). Do not bend your neck for this movement. Continue to face straight ahead. If you are doing the exercise properly, you will feel a slight sensation in your throat and a stretch at the back of your neck. Hold the stretch for 1-2 seconds. Repeat __________ times. Complete this exercise __________ times a day. Strengthening exercises Neck press  Lie on your back on a firm bed or on the floor with a pillow under your head. Use your neck muscles to push your head down on the pillow and straighten your spine. Hold the position as well as you can. Keep your head facing up (in a neutral position) and your chin tucked. Slowly count to 5 while holding this position. Repeat __________ times. Complete this exercise __________ times a day. Isometrics These are exercises in which you strengthen the muscles in your neck while keeping your neck still (isometrics). Sit in a supportive chair and place your hand on your forehead. Keep your head and face facing straight ahead. Do not flex or extend your neck while doing isometrics. Push forward with your head and neck while pushing back with your hand. Hold for 10 seconds. Do the sequence again, this time putting your hand against the back of your head. Use your head and neck to push backward against the hand pressure. Finally, do the same exercise on either side of your head, pushing sideways against the pressure of your hand. Repeat __________ times. Complete this exercise __________ times a day. Prone head lifts  Lie face-down (prone position), resting on your elbows so that your chest and upper back are raised. Start with your head facing downward, near your chest. Position your chin either on or near your chest. Slowly lift your head upward. Lift until you are looking straight ahead. Then continue lifting your head as far back as you can comfortably stretch. Hold your head up for 5 seconds. Then slowly lower it to your starting  position. Repeat __________ times. Complete this exercise __________ times a day. Supine head lifts  Lie on your back (supine position), bending your knees to point to the ceiling and keeping your feet flat on the floor. Lift your head slowly off the floor, raising your chin toward your chest. Hold for 5 seconds. Repeat __________ times. Complete this exercise __________ times a day. Scapular retraction  Stand with your arms at your sides. Look straight ahead. Slowly pull both shoulders (scapulae) backward and downward (retraction) until you feel a stretch between your shoulder blades in your upper back. Hold for 10-30 seconds. Relax and repeat. Repeat __________ times. Complete this exercise __________ times a day. Contact a health care provider if: Your neck pain or discomfort gets worse when you do an exercise. Your neck pain or discomfort does not improve within 2 hours after you exercise. If you have any of these problems, stop exercising right away. Do not do the exercises again unless your health care provider says  that you can. Get help right away if: You develop sudden, severe neck pain. If this happens, stop exercising right away. Do not do the exercises again unless your health care provider says that you can. This information is not intended to replace advice given to you by your health care provider. Make sure you discuss any questions you have with your health care provider. Document Revised: 07/23/2020 Document Reviewed: 07/23/2020 Elsevier Patient Education  2024 [employer].

## 2023-09-02 ENCOUNTER — Encounter: Payer: Self-pay | Admitting: Pharmacist

## 2023-09-02 NOTE — Progress Notes (Signed)
 Pharmacy Quality Measure Review  This patient is appearing on a report for being at risk of failing the adherence measure for cholesterol (statin) medications this calendar year.   Medication: atorvastatin  Last fill date: 6/11 for 84 day supply  Insurance report was not up to date. No action needed at this time.   Catie IVAR Centers, PharmD, [facility] Clinical Pharmacist [phone]

## 2023-09-15 ENCOUNTER — Ambulatory Visit: Admitting: Cardiology

## 2023-09-17 ENCOUNTER — Telehealth: Payer: Self-pay

## 2023-09-17 ENCOUNTER — Other Ambulatory Visit (HOSPITAL_COMMUNITY): Payer: Self-pay

## 2023-09-17 ENCOUNTER — Ambulatory Visit: Attending: Cardiology | Admitting: Cardiology

## 2023-09-17 ENCOUNTER — Encounter: Payer: Self-pay | Admitting: Cardiology

## 2023-09-17 VITALS — BP 124/56 | HR 91 | Ht 65.0 in | Wt 209.4 lb

## 2023-09-17 DIAGNOSIS — E782 Mixed hyperlipidemia: Secondary | ICD-10-CM | POA: Diagnosis not present

## 2023-09-17 DIAGNOSIS — R072 Precordial pain: Secondary | ICD-10-CM

## 2023-09-17 DIAGNOSIS — I10 Essential (primary) hypertension: Secondary | ICD-10-CM | POA: Diagnosis not present

## 2023-09-17 DIAGNOSIS — R0609 Other forms of dyspnea: Secondary | ICD-10-CM

## 2023-09-17 MED ORDER — METOPROLOL TARTRATE 100 MG PO TABS
100.0000 mg | ORAL_TABLET | Freq: Once | ORAL | 0 refills | Status: AC
  Filled 2023-09-17: qty 1, 1d supply, fill #0

## 2023-09-17 NOTE — Patient Instructions (Addendum)
 Medication Instructions:  ON DAY OF CORONARY CTA: Take Metoprolol  tartrate 100 mg Take 1 tablet (100 mg total) by mouth once for 1 dose. Take 90-120 minutes prior to scan. Hold for SBP less than 110.,   *If you need a refill on your cardiac medications before your next appointment, please call your pharmacy*   Testing/Procedures: Coronary CTA  Your physician has requested that you have cardiac CT. Cardiac computed tomography (CT) is a painless test that uses an x-ray machine to take clear, detailed pictures of your heart. For further information please visit [email or web address] Please follow instruction sheet as given.    Follow-Up: At [facility], you and your health needs are our priority.  As part of our continuing mission to provide you with exceptional heart care, our providers are all part of one team.  This team includes your primary Cardiologist (physician) and Advanced Practice Providers or APPs (Physician Assistants and Nurse Practitioners) who all work together to provide you with the care you need, when you need it.  Your next appointment:   3 month(s)  Provider:   Newman JINNY Lawrence, MD      Your cardiac CT will be scheduled at one of the below locations:   [facility] [address] [place], [place] [address] [phone]  OR   [facility] [address]  [place], [place] [address]  If scheduled at [facility], please arrive at the [facility] and Children's Entrance (Entrance C2) of [facility] 30 minutes prior to test start time. You can use the FREE valet parking offered at entrance C (encouraged to control the heart rate for the test)  Proceed to the [facility] Radiology Department (first floor) to check-in and test prep.  All radiology patients and guests should use entrance C2 at [facility], accessed from [facility], even though the hospital's physical address listed is  [address].  If scheduled at the Heart and Vascular Tower at [address], please enter the parking lot using the [address] entrance and use the FREE valet service at the patient drop-off area. Enter the building and check-in with registration on the main floor.  Please follow these instructions carefully (unless otherwise directed):  An IV will be required for this test and Nitroglycerin will be given.  Hold all erectile dysfunction medications at least 3 days (72 hrs) prior to test. (Ie viagra, cialis, sildenafil, tadalafil, etc)   On the Night Before the Test: Be sure to Drink plenty of water. Do not consume any caffeinated/decaffeinated beverages or chocolate 12 hours prior to your test. Do not take any antihistamines 12 hours prior to your test.  On the Day of the Test: Drink plenty of water until 1 hour prior to the test. Do not eat any food 1 hour prior to test. You may take your regular medications prior to the test.  Take metoprolol  (Lopressor ) 100 mg two hours prior to test. If you take Furosemide/Hydrochlorothiazide/Spironolactone /Chlorthalidone, please HOLD on the morning of the test. Patients who wear a continuous glucose monitor MUST remove the device prior to scanning. FEMALES- please wear underwire-free bra if available, avoid dresses & tight clothing    After the Test: Drink plenty of water. After receiving IV contrast, you may experience a mild flushed feeling. This is normal. On occasion, you may experience a mild rash up to 24 hours after the test. This is not dangerous. If this occurs, you can  take Benadryl 25 mg, Zyrtec , Claritin , or Allegra and increase your fluid intake. (Patients taking Tikosyn should avoid Benadryl, and may take Zyrtec , Claritin , or Allegra) If you experience trouble breathing, this can be serious. If it is severe call 911 IMMEDIATELY. If it is mild, please call our office.  We will call to schedule your test 2-4 weeks out  understanding that some insurance companies will need an authorization prior to the service being performed.   For more information and frequently asked questions, please visit our website : [email or web address]  For non-scheduling related questions, please contact the cardiac imaging nurse navigator should you have any questions/concerns: Cardiac Imaging Nurse Navigators Direct Office Dial: [phone]   For scheduling needs, including cancellations and rescheduling, please call [place], [phone].

## 2023-09-17 NOTE — Progress Notes (Signed)
  Cardiology Office Note:  .   Date:  09/17/2023  ID:  [name], DOB January 22, 1966, MRN [id number] PCP: Heidi Glendia DELENA, MD  [facility] Providers Cardiologist:  Newman Lawrence, MD PCP: Heidi Glendia DELENA, MD  Chief Complaint  Patient presents with   Chest Pain     [name] is a 58 y.o. female with hypertension, hyperlipidemia, type 2 diabetes mellitus, dyspnea on exertion, chest pain  History of Present Illness Patient continues to have exertional dyspnea, orthopnea, as well as chest tightness symptoms.  Echocardiogram is pending.  Blood flow did not suggest acute congestive heart failure.  Blood pressure is better controlled.    Vitals:   09/17/23 1314  BP: (!) 124/56  Pulse: 91  SpO2: 95%       Review of Systems  Cardiovascular:  Positive for chest pain, dyspnea on exertion, orthopnea and paroxysmal nocturnal dyspnea. Negative for leg swelling, palpitations and syncope.        Studies Reviewed: [name]        EKG 08/12/2023: Normal sinus rhythm Normal ECG When compared with ECG of 16-Apr-2018 12:35, No significant change was found    Labs 08/2023: Chol 146, TG 56, HDL 49, LDL 85 HbA1C 6.4% Hb 13.8 Cr 0.7 TSH 0.7  04/2023: Chol 132, TG 63, HDL 47, LDL 72 HbA1C 6.6% Hb 12.2 Cr 0.7 TSH 0.7   Physical Exam Vitals and nursing note reviewed.  Constitutional:      General: She is not in acute distress. Neck:     Vascular: No JVD.  Cardiovascular:     Rate and Rhythm: Normal rate and regular rhythm.     Heart sounds: Normal heart sounds. No murmur heard. Pulmonary:     Effort: Pulmonary effort is normal.     Breath sounds: Normal breath sounds. No wheezing or rales.  Musculoskeletal:     Right lower leg: No edema.     Left lower leg: No edema.      VISIT DIAGNOSES:   ICD-10-CM   1. Precordial pain  R07.2 CT CORONARY MORPH W/CTA COR W/SCORE W/CA W/CM &/OR WO/CM    metoprolol  tartrate (LOPRESSOR ) 100 MG tablet    2.  Primary hypertension  I10     3. Mixed hyperlipidemia  E78.2     4. Exertional dyspnea  R06.09         [name] is a 58 y.o. female with hypertension, hyperlipidemia, type 2 diabetes mellitus, dyspnea on exertion Assessment & Plan Chest pain, dyspnea on exertion: No heart failure on exam, symptoms remain concerning. Echocardiogram is pending. Given her risk factors, also ordered coronary CT angiogram. Continue spironolactone  25 mg daily, valsartan  80 mg daily, amlodipine  5 mg daily, atorvastatin  10 mg daily.  Hypertension: Not well-controlled.  Type 2 diabetes mellitus without complications: Well controlled with glipizide .  Hyperlipidemia: Well-controlled on Lipitor.    Meds ordered this encounter  Medications   metoprolol  tartrate (LOPRESSOR ) 100 MG tablet    Sig: Take 1 tablet (100 mg total) by mouth once for 1 dose. Take 90-120 minutes prior to scan. Hold for SBP less than 110.    Dispense:  1 tablet    Refill:  0     F/u in 3 months  Signed, Newman JINNY Lawrence, MD

## 2023-09-17 NOTE — Telephone Encounter (Signed)
 Left message to call back. Will need to cancel appointment today with Dr. Elmira on 09/17/2023. Need pt to have had echo before appointment. Please reschedule.

## 2023-09-22 ENCOUNTER — Ambulatory Visit (HOSPITAL_COMMUNITY)
Admission: RE | Admit: 2023-09-22 | Discharge: 2023-09-22 | Disposition: A | Discharge status: Home / Self Care | Source: Ambulatory Visit | Attending: Cardiology | Admitting: Cardiology

## 2023-09-22 DIAGNOSIS — R072 Precordial pain: Secondary | ICD-10-CM | POA: Diagnosis not present

## 2023-09-22 MED ORDER — IOHEXOL 350 MG/ML SOLN
100.0000 mL | Freq: Once | INTRAVENOUS | Status: AC | PRN
  Administered 2023-09-22 (×2): 100 mL via INTRAVENOUS

## 2023-09-22 MED ORDER — NITROGLYCERIN 0.4 MG SL SUBL
0.8000 mg | SUBLINGUAL_TABLET | Freq: Once | SUBLINGUAL | Status: AC
  Administered 2023-09-22 (×2): 0.8 mg via SUBLINGUAL

## 2023-09-24 ENCOUNTER — Ambulatory Visit: Payer: Self-pay | Admitting: Cardiology

## 2023-09-27 ENCOUNTER — Ambulatory Visit (HOSPITAL_COMMUNITY)
Admission: RE | Admit: 2023-09-27 | Discharge: 2023-09-27 | Disposition: A | Discharge status: Home / Self Care | Source: Ambulatory Visit | Attending: Cardiology | Admitting: Cardiology

## 2023-09-27 DIAGNOSIS — R0609 Other forms of dyspnea: Secondary | ICD-10-CM | POA: Diagnosis not present

## 2023-09-27 LAB — ECHOCARDIOGRAM COMPLETE
Area-P 1/2: 2.76 cm2
S' Lateral: 2.9 cm

## 2023-10-23 ENCOUNTER — Encounter: Payer: Self-pay | Admitting: *Deleted

## 2023-10-23 LAB — AMB RESULTS CONSOLE CBG: Glucose: 101

## 2023-11-17 ENCOUNTER — Other Ambulatory Visit: Payer: Self-pay | Admitting: *Deleted

## 2023-11-17 DIAGNOSIS — Z79899 Other long term (current) drug therapy: Secondary | ICD-10-CM

## 2023-11-17 DIAGNOSIS — E1169 Type 2 diabetes mellitus with other specified complication: Secondary | ICD-10-CM

## 2023-11-17 DIAGNOSIS — E119 Type 2 diabetes mellitus without complications: Secondary | ICD-10-CM

## 2023-12-15 ENCOUNTER — Other Ambulatory Visit: Payer: Self-pay | Admitting: Nurse Practitioner

## 2023-12-22 ENCOUNTER — Ambulatory Visit: Admitting: Cardiology

## 2024-01-04 ENCOUNTER — Other Ambulatory Visit: Payer: Self-pay | Admitting: Family Medicine

## 2024-02-07 ENCOUNTER — Ambulatory Visit: Admitting: Family Medicine

## 2024-02-22 ENCOUNTER — Ambulatory Visit: Admitting: Family Medicine

## 2024-02-22 VITALS — BP 121/77 | HR 79 | Temp 97.7°F | Ht 65.0 in | Wt 211.0 lb

## 2024-02-22 DIAGNOSIS — Z79899 Other long term (current) drug therapy: Secondary | ICD-10-CM | POA: Diagnosis not present

## 2024-02-22 DIAGNOSIS — E785 Hyperlipidemia, unspecified: Secondary | ICD-10-CM

## 2024-02-22 DIAGNOSIS — E1169 Type 2 diabetes mellitus with other specified complication: Secondary | ICD-10-CM | POA: Diagnosis not present

## 2024-02-22 DIAGNOSIS — E119 Type 2 diabetes mellitus without complications: Secondary | ICD-10-CM

## 2024-02-22 DIAGNOSIS — Z113 Encounter for screening for infections with a predominantly sexual mode of transmission: Secondary | ICD-10-CM

## 2024-02-22 DIAGNOSIS — R233 Spontaneous ecchymoses: Secondary | ICD-10-CM | POA: Diagnosis not present

## 2024-02-22 MED ORDER — AZELASTINE HCL 0.1 % NA SOLN: 2.0000 | Freq: Two times a day (BID) | NASAL | 12 refills | Status: AC

## 2024-02-22 MED ORDER — HYDROXYZINE HCL 25 MG PO TABS: ORAL_TABLET | ORAL | 4 refills | Status: AC

## 2024-02-22 MED ORDER — VALSARTAN 80 MG PO TABS: 80.0000 mg | ORAL_TABLET | Freq: Every day | ORAL | 2 refills | Status: AC

## 2024-02-22 NOTE — Progress Notes (Signed)
" ° °  Subjective:    Patient ID: [name], female    DOB: 1965/06/30, 59 y.o.   MRN: [id number]  HPI Patient is in room 9  Patient is here for a 6 month follow up Patient has history of high blood pressure takes a medicine regular basis tolerates it well Takes her diabetes meds Cannot afford glucometer strips Does not think she has had any low sugars Her headaches under decent control taking her topiramate  Moods are doing okay taking her sertraline  on a regular basis Patient with hyperlipidemia takes her medicine regular basis Patient is needing refills on medications Patient also states some easy bruisability she has noted over the past couple weeks on arms and legs but no bleeding issues Patient is also needing to get her handicap placement renewed Patient has significant orthopedic issues as well as significant fatigue with distance walking therefore we will do her handicap placard Review of Systems Patient also states she would like to have STD screening    Objective:   Physical Exam General-in no acute distress Eyes-no discharge Lungs-respiratory rate normal, CTA CV-no murmurs,RRR Extremities skin warm dry no edema Neuro grossly normal Behavior normal, alert        Assessment & Plan:   1. Diabetes mellitus without complication (HCC) (Primary) Check A1c check lab work await results continue current medication healthy diet stay active - Basic metabolic panel with GFR - Hemoglobin A1c - Microalbumin/Creatinine Ratio, Urine  2. Hyperlipidemia associated with type 2 diabetes mellitus (HCC) Continue statin and healthy diet - Lipid panel  3. High risk medication use Check labs - Microalbumin/Creatinine Ratio, Urine  4. Easy bruisability CBC ordered await results - CBC with Differential/Platelet  5. Screen for STD (sexually transmitted disease) Screening for STD ordered - RPR+HIV+GC+CT Panel  Handicap placard filled out "

## 2024-02-26 LAB — BASIC METABOLIC PANEL WITH GFR
BUN/Creatinine Ratio: 18 (ref 12–28)
BUN: 16 mg/dL (ref 8–27)
CO2: 23 mmol/L (ref 20–29)
Calcium: 9.9 mg/dL (ref 8.7–10.3)
Chloride: 101 mmol/L (ref 96–106)
Creatinine, Ser: 0.88 mg/dL (ref 0.57–1.00)
Glucose: 119 mg/dL — ABNORMAL HIGH (ref 70–99)
Potassium: 5 mmol/L (ref 3.5–5.2)
Sodium: 139 mmol/L (ref 134–144)
eGFR: 75 mL/min/1.73

## 2024-02-26 LAB — LIPID PANEL
Chol/HDL Ratio: 3.2 ratio (ref 0.0–4.4)
Cholesterol, Total: 124 mg/dL (ref 100–199)
HDL: 39 mg/dL — ABNORMAL LOW
LDL Chol Calc (NIH): 70 mg/dL (ref 0–99)
Triglycerides: 77 mg/dL (ref 0–149)
VLDL Cholesterol Cal: 15 mg/dL (ref 5–40)

## 2024-02-26 LAB — CBC WITH DIFFERENTIAL/PLATELET
Basophils Absolute: 0 x10E3/uL (ref 0.0–0.2)
Basos: 1 %
EOS (ABSOLUTE): 0.2 x10E3/uL (ref 0.0–0.4)
Eos: 3 %
Hematocrit: 43.5 % (ref 34.0–46.6)
Hemoglobin: 13.6 g/dL (ref 11.1–15.9)
Immature Grans (Abs): 0 x10E3/uL (ref 0.0–0.1)
Immature Granulocytes: 0 %
Lymphocytes Absolute: 2 x10E3/uL (ref 0.7–3.1)
Lymphs: 32 %
MCH: 28.9 pg (ref 26.6–33.0)
MCHC: 31.3 g/dL — ABNORMAL LOW (ref 31.5–35.7)
MCV: 93 fL (ref 79–97)
Monocytes Absolute: 0.5 x10E3/uL (ref 0.1–0.9)
Monocytes: 9 %
Neutrophils Absolute: 3.4 x10E3/uL (ref 1.4–7.0)
Neutrophils: 55 %
Platelets: 318 x10E3/uL (ref 150–450)
RBC: 4.7 x10E6/uL (ref 3.77–5.28)
RDW: 12.9 % (ref 11.7–15.4)
WBC: 6 x10E3/uL (ref 3.4–10.8)

## 2024-02-26 LAB — HEMOGLOBIN A1C
Est. average glucose Bld gHb Est-mCnc: 140 mg/dL
Hgb A1c MFr Bld: 6.5 % — ABNORMAL HIGH (ref 4.8–5.6)

## 2024-02-26 LAB — MICROALBUMIN / CREATININE URINE RATIO
Creatinine, Urine: 119 mg/dL
Microalb/Creat Ratio: 9 mg/g{creat} (ref 0–29)
Microalbumin, Urine: 10.6 ug/mL

## 2024-02-27 ENCOUNTER — Ambulatory Visit: Payer: Self-pay | Admitting: Family Medicine

## 2024-02-27 LAB — RPR+HIV+GC+CT PANEL
HIV Screen 4th Generation wRfx: NONREACTIVE
RPR Ser Ql: NONREACTIVE

## 2024-03-03 ENCOUNTER — Encounter: Payer: Self-pay | Admitting: Cardiology

## 2024-03-03 ENCOUNTER — Ambulatory Visit: Attending: Cardiology | Admitting: Cardiology

## 2024-03-03 VITALS — BP 108/60 | HR 76 | Ht 65.0 in | Wt 215.0 lb

## 2024-03-03 DIAGNOSIS — R0609 Other forms of dyspnea: Secondary | ICD-10-CM

## 2024-03-03 NOTE — Progress Notes (Signed)
 " Cardiology Office Note:  .   Date:  03/03/2024  ID:  [name], DOB 10/22/1965, MRN [id number] PCP: [name], MD  [facility] Providers Cardiologist:  Newman Lawrence, MD PCP: [name], MD  Chief Complaint  Patient presents with   Dyspnea on exertion     [name] is a 60 y.o. female with hypertension, hyperlipidemia, type 2 diabetes mellitus, dyspnea on exertion, chest pain  History of Present Illness Patient continues to have shortness of breath symptoms.  Reviewed recent test results with patient, details below.    Vitals:   03/03/24 1304  BP: 108/60  Pulse: 76  SpO2: 98%        Review of Systems  Cardiovascular:  Positive for dyspnea on exertion. Negative for chest pain, leg swelling, orthopnea, palpitations, paroxysmal nocturnal dyspnea and syncope.        Studies Reviewed: SABRA        EKG 08/12/2023: Normal sinus rhythm Normal ECG When compared with ECG of 16-Apr-2018 12:35, No significant change was found    Echocardiogram 09/2023:  1. Normal left atrial strain. Left ventricular ejection fraction, by  estimation, is 60 to 65%. Left ventricular ejection fraction by 3D volume  is 64 %. The left ventricle has normal function. The left ventricle has no  regional wall motion abnormalities.   Left ventricular diastolic parameters were normal. The average left  ventricular global longitudinal strain is -19.7 %. The global longitudinal  strain is normal.   2. Right ventricular systolic function is normal. The right ventricular  size is normal.   3. The mitral valve is normal in structure. No evidence of mitral valve  regurgitation. No evidence of mitral stenosis.   4. The aortic valve is tricuspid. Aortic valve regurgitation is not  visualized. No aortic stenosis is present.   5. Cannot exclude a small PFO.   Coronary CT angiogram 09/2023: CAD-RADS 0: No evidence of CAD (0%). Consider non-atherosclerotic causes of  chest pain.  Labs 02/2024: Chol 124, TG 77, HDL 39, LDL 70 HbA1C 6.5% Hb 13.6 Cr 0.88, eGFR 75     Physical Exam Vitals and nursing note reviewed.  Constitutional:      General: She is not in acute distress. Neck:     Vascular: No JVD.  Cardiovascular:     Rate and Rhythm: Normal rate and regular rhythm.     Heart sounds: Normal heart sounds. No murmur heard. Pulmonary:     Effort: Pulmonary effort is normal.     Breath sounds: Normal breath sounds. No wheezing or rales.  Musculoskeletal:     Right lower leg: No edema.     Left lower leg: No edema.      VISIT DIAGNOSES:   ICD-10-CM   1. Exertional dyspnea  R06.09          ALAZAY Tyler is a 59 y.o. female with hypertension, hyperlipidemia, type 2 diabetes mellitus, dyspnea on exertion Assessment & Plan Dyspnea on exertion: Essentially normal echocardiogram, event of small PFO does not explain shortness of breath symptoms. Coronary CT angiogram shows no coronary artery disease. proBNP was normal. Overall, I do not see any cardiac etiology for patient's symptoms. Okay to continue current medications if they are helping me blood pressure. Consider evaluation for noncardiac etiology for shortness of breath, including but not limited to obesity, deconditioning, or any pulmonary etiology.  Hypertension: Controlled  Type 2 diabetes mellitus without complications: Well controlled with glipizide .  Hyperlipidemia: Well-controlled  on Lipitor.     F/u as needed  Signed, Newman JINNY Lawrence, MD  "

## 2024-03-03 NOTE — Patient Instructions (Signed)
 Follow-Up: At [facility], you and your health needs are our priority.  As part of our continuing mission to provide you with exceptional heart care, our providers are all part of one team.  This team includes your primary Cardiologist (physician) and Advanced Practice Providers or APPs (Physician Assistants and Nurse Practitioners) who all work together to provide you with the care you need, when you need it.  Your next appointment:   As needed  Provider:   Cody Das, MD

## 2024-03-16 ENCOUNTER — Encounter: Payer: Self-pay | Admitting: *Deleted

## 2024-03-16 NOTE — Progress Notes (Signed)
 [name]                                          MRN: [id number]   03/16/2024   The [facility] Quality Team Specialist reviewed this patient medical record for the purposes of chart review for care gap closure. The following were reviewed: abstraction for care gap closure-diabetic eye exam.    [facility] Quality Team

## 2024-03-16 NOTE — Progress Notes (Signed)
 [name]                                          MRN: [id number]   03/16/2024   The [facility] Quality Team Specialist reviewed this patient medical record for the purposes of chart review for care gap closure. The following were reviewed: abstraction for care gap closure-diabetic eye exam.    [facility] Quality Team

## 2024-03-17 ENCOUNTER — Other Ambulatory Visit: Payer: Self-pay | Admitting: Family Medicine

## 2024-03-17 DIAGNOSIS — Z1231 Encounter for screening mammogram for malignant neoplasm of breast: Secondary | ICD-10-CM

## 2024-04-17 ENCOUNTER — Ambulatory Visit

## 2024-06-02 ENCOUNTER — Ambulatory Visit

## 2024-08-21 ENCOUNTER — Ambulatory Visit: Admitting: Family Medicine
